# Patient Record
Sex: Female | Born: 1960 | Race: Black or African American | Hispanic: No | Smoking: Current every day smoker
Health system: Southern US, Community
[De-identification: ages and names within clinical notes are randomized; demographics above are authoritative.]

## PROBLEM LIST (undated history)

## (undated) DIAGNOSIS — J45909 Unspecified asthma, uncomplicated: Secondary | ICD-10-CM

## (undated) DIAGNOSIS — C801 Malignant (primary) neoplasm, unspecified: Secondary | ICD-10-CM

## (undated) DIAGNOSIS — E119 Type 2 diabetes mellitus without complications: Secondary | ICD-10-CM

## (undated) DIAGNOSIS — F1210 Cannabis abuse, uncomplicated: Secondary | ICD-10-CM

## (undated) DIAGNOSIS — G8929 Other chronic pain: Secondary | ICD-10-CM

## (undated) DIAGNOSIS — I10 Essential (primary) hypertension: Secondary | ICD-10-CM

## (undated) HISTORY — PX: OTHER SURGICAL HISTORY: SHX169

## (undated) HISTORY — PX: TUBAL LIGATION: SHX77

## (undated) HISTORY — PX: JOINT REPLACEMENT: SHX530

---

## 1999-04-23 ENCOUNTER — Emergency Department (HOSPITAL_COMMUNITY): Admission: EM | Admit: 1999-04-23 | Discharge: 1999-04-23 | Payer: Self-pay | Admitting: Emergency Medicine

## 1999-04-26 ENCOUNTER — Emergency Department (HOSPITAL_COMMUNITY): Admission: EM | Admit: 1999-04-26 | Discharge: 1999-04-26 | Payer: Self-pay | Admitting: Emergency Medicine

## 1999-04-26 ENCOUNTER — Encounter: Payer: Self-pay | Admitting: Emergency Medicine

## 1999-05-04 ENCOUNTER — Ambulatory Visit (HOSPITAL_COMMUNITY): Admission: RE | Admit: 1999-05-04 | Discharge: 1999-05-04 | Payer: Self-pay | Admitting: Internal Medicine

## 1999-05-04 ENCOUNTER — Encounter: Admission: RE | Admit: 1999-05-04 | Discharge: 1999-05-04 | Payer: Self-pay | Admitting: Internal Medicine

## 1999-05-11 ENCOUNTER — Encounter: Admission: RE | Admit: 1999-05-11 | Discharge: 1999-05-11 | Payer: Self-pay | Admitting: Internal Medicine

## 1999-09-07 ENCOUNTER — Encounter: Payer: Self-pay | Admitting: Emergency Medicine

## 1999-09-07 ENCOUNTER — Emergency Department (HOSPITAL_COMMUNITY): Admission: EM | Admit: 1999-09-07 | Discharge: 1999-09-07 | Payer: Self-pay | Admitting: Emergency Medicine

## 1999-09-14 ENCOUNTER — Encounter: Admission: RE | Admit: 1999-09-14 | Discharge: 1999-09-14 | Payer: Self-pay | Admitting: Obstetrics & Gynecology

## 1999-10-05 ENCOUNTER — Encounter: Admission: RE | Admit: 1999-10-05 | Discharge: 1999-10-05 | Payer: Self-pay | Admitting: Obstetrics & Gynecology

## 1999-11-30 ENCOUNTER — Encounter: Admission: RE | Admit: 1999-11-30 | Discharge: 1999-11-30 | Payer: Self-pay | Admitting: Obstetrics

## 2000-03-21 ENCOUNTER — Ambulatory Visit (HOSPITAL_COMMUNITY): Admission: RE | Admit: 2000-03-21 | Discharge: 2000-03-21 | Payer: Self-pay | Admitting: Obstetrics & Gynecology

## 2000-03-21 ENCOUNTER — Encounter: Admission: RE | Admit: 2000-03-21 | Discharge: 2000-03-21 | Payer: Self-pay | Admitting: Obstetrics & Gynecology

## 2000-03-21 ENCOUNTER — Encounter: Payer: Self-pay | Admitting: Obstetrics & Gynecology

## 2000-03-22 ENCOUNTER — Inpatient Hospital Stay (HOSPITAL_COMMUNITY): Admission: AD | Admit: 2000-03-22 | Discharge: 2000-03-22 | Payer: Self-pay | Admitting: Obstetrics

## 2000-03-26 ENCOUNTER — Inpatient Hospital Stay (HOSPITAL_COMMUNITY): Admission: AD | Admit: 2000-03-26 | Discharge: 2000-03-28 | Payer: Self-pay | Admitting: *Deleted

## 2000-04-04 ENCOUNTER — Inpatient Hospital Stay (HOSPITAL_COMMUNITY): Admission: AD | Admit: 2000-04-04 | Discharge: 2000-04-06 | Payer: Self-pay | Admitting: *Deleted

## 2000-04-04 ENCOUNTER — Encounter (HOSPITAL_COMMUNITY): Admission: RE | Admit: 2000-04-04 | Discharge: 2000-05-19 | Payer: Self-pay | Admitting: *Deleted

## 2000-04-11 ENCOUNTER — Encounter: Payer: Self-pay | Admitting: *Deleted

## 2000-04-20 ENCOUNTER — Inpatient Hospital Stay (HOSPITAL_COMMUNITY): Admission: AD | Admit: 2000-04-20 | Discharge: 2000-04-20 | Payer: Self-pay | Admitting: Obstetrics & Gynecology

## 2000-04-28 ENCOUNTER — Encounter: Payer: Self-pay | Admitting: *Deleted

## 2000-05-02 ENCOUNTER — Encounter: Payer: Self-pay | Admitting: *Deleted

## 2000-05-18 ENCOUNTER — Encounter (INDEPENDENT_AMBULATORY_CARE_PROVIDER_SITE_OTHER): Payer: Self-pay

## 2000-05-18 ENCOUNTER — Inpatient Hospital Stay (HOSPITAL_COMMUNITY): Admission: AD | Admit: 2000-05-18 | Discharge: 2000-06-04 | Payer: Self-pay | Admitting: Obstetrics & Gynecology

## 2000-05-20 ENCOUNTER — Encounter: Payer: Self-pay | Admitting: *Deleted

## 2000-05-24 ENCOUNTER — Encounter: Payer: Self-pay | Admitting: *Deleted

## 2000-05-26 ENCOUNTER — Encounter: Payer: Self-pay | Admitting: Obstetrics & Gynecology

## 2000-05-28 ENCOUNTER — Encounter: Payer: Self-pay | Admitting: Obstetrics & Gynecology

## 2000-05-29 ENCOUNTER — Encounter: Payer: Self-pay | Admitting: Obstetrics & Gynecology

## 2000-06-07 ENCOUNTER — Inpatient Hospital Stay (HOSPITAL_COMMUNITY): Admission: AD | Admit: 2000-06-07 | Discharge: 2000-06-07 | Payer: Self-pay | Admitting: Obstetrics

## 2000-08-01 ENCOUNTER — Encounter (INDEPENDENT_AMBULATORY_CARE_PROVIDER_SITE_OTHER): Payer: Self-pay | Admitting: Specialist

## 2000-08-01 ENCOUNTER — Inpatient Hospital Stay (HOSPITAL_COMMUNITY): Admission: AD | Admit: 2000-08-01 | Discharge: 2000-08-01 | Payer: Self-pay | Admitting: *Deleted

## 2000-08-05 ENCOUNTER — Emergency Department (HOSPITAL_COMMUNITY): Admission: EM | Admit: 2000-08-05 | Discharge: 2000-08-05 | Payer: Self-pay | Admitting: *Deleted

## 2000-08-31 ENCOUNTER — Encounter: Admission: RE | Admit: 2000-08-31 | Discharge: 2000-08-31 | Payer: Self-pay | Admitting: Obstetrics

## 2000-11-30 ENCOUNTER — Encounter: Admission: RE | Admit: 2000-11-30 | Discharge: 2000-11-30 | Payer: Self-pay | Admitting: Obstetrics

## 2001-01-24 ENCOUNTER — Emergency Department (HOSPITAL_COMMUNITY): Admission: EM | Admit: 2001-01-24 | Discharge: 2001-01-24 | Payer: Self-pay | Admitting: Emergency Medicine

## 2001-03-02 ENCOUNTER — Encounter: Admission: RE | Admit: 2001-03-02 | Discharge: 2001-03-02 | Payer: Self-pay | Admitting: Obstetrics & Gynecology

## 2001-04-09 ENCOUNTER — Encounter: Admission: RE | Admit: 2001-04-09 | Discharge: 2001-04-09 | Payer: Self-pay | Admitting: Obstetrics & Gynecology

## 2001-05-31 ENCOUNTER — Encounter: Admission: RE | Admit: 2001-05-31 | Discharge: 2001-05-31 | Payer: Self-pay | Admitting: Obstetrics

## 2001-07-18 ENCOUNTER — Emergency Department (HOSPITAL_COMMUNITY): Admission: EM | Admit: 2001-07-18 | Discharge: 2001-07-18 | Payer: Self-pay | Admitting: Emergency Medicine

## 2001-07-18 ENCOUNTER — Encounter: Payer: Self-pay | Admitting: Emergency Medicine

## 2001-08-16 ENCOUNTER — Encounter: Admission: RE | Admit: 2001-08-16 | Discharge: 2001-08-16 | Payer: Self-pay | Admitting: *Deleted

## 2001-09-18 ENCOUNTER — Encounter: Admission: RE | Admit: 2001-09-18 | Discharge: 2001-09-18 | Payer: Self-pay | Admitting: Obstetrics and Gynecology

## 2001-10-08 ENCOUNTER — Ambulatory Visit (HOSPITAL_COMMUNITY): Admission: RE | Admit: 2001-10-08 | Discharge: 2001-10-08 | Payer: Self-pay | Admitting: Obstetrics and Gynecology

## 2001-10-15 ENCOUNTER — Inpatient Hospital Stay (HOSPITAL_COMMUNITY): Admission: AD | Admit: 2001-10-15 | Discharge: 2001-10-15 | Payer: Self-pay | Admitting: *Deleted

## 2001-10-30 ENCOUNTER — Encounter: Admission: RE | Admit: 2001-10-30 | Discharge: 2001-10-30 | Payer: Self-pay | Admitting: *Deleted

## 2002-07-26 ENCOUNTER — Inpatient Hospital Stay (HOSPITAL_COMMUNITY): Admission: AD | Admit: 2002-07-26 | Discharge: 2002-07-26 | Payer: Self-pay | Admitting: Family Medicine

## 2002-07-26 ENCOUNTER — Encounter: Payer: Self-pay | Admitting: Family Medicine

## 2002-07-30 ENCOUNTER — Inpatient Hospital Stay (HOSPITAL_COMMUNITY): Admission: AD | Admit: 2002-07-30 | Discharge: 2002-07-30 | Payer: Self-pay | Admitting: *Deleted

## 2002-08-06 ENCOUNTER — Other Ambulatory Visit: Admission: RE | Admit: 2002-08-06 | Discharge: 2002-08-06 | Payer: Self-pay | Admitting: Obstetrics and Gynecology

## 2002-08-06 ENCOUNTER — Encounter: Admission: RE | Admit: 2002-08-06 | Discharge: 2002-08-06 | Payer: Self-pay | Admitting: Obstetrics and Gynecology

## 2002-09-13 ENCOUNTER — Encounter: Admission: RE | Admit: 2002-09-13 | Discharge: 2002-09-13 | Payer: Self-pay | Admitting: Family Medicine

## 2002-09-25 ENCOUNTER — Encounter: Admission: RE | Admit: 2002-09-25 | Discharge: 2002-09-25 | Payer: Self-pay | Admitting: Internal Medicine

## 2002-09-25 ENCOUNTER — Ambulatory Visit (HOSPITAL_COMMUNITY): Admission: RE | Admit: 2002-09-25 | Discharge: 2002-09-25 | Payer: Self-pay | Admitting: Obstetrics and Gynecology

## 2002-10-02 ENCOUNTER — Encounter: Admission: RE | Admit: 2002-10-02 | Discharge: 2002-10-02 | Payer: Self-pay | Admitting: Internal Medicine

## 2002-10-07 ENCOUNTER — Encounter: Payer: Self-pay | Admitting: Internal Medicine

## 2002-10-07 ENCOUNTER — Encounter: Admission: RE | Admit: 2002-10-07 | Discharge: 2002-10-07 | Payer: Self-pay | Admitting: Internal Medicine

## 2002-10-07 ENCOUNTER — Ambulatory Visit (HOSPITAL_COMMUNITY): Admission: RE | Admit: 2002-10-07 | Discharge: 2002-10-07 | Payer: Self-pay | Admitting: Internal Medicine

## 2003-07-23 ENCOUNTER — Emergency Department (HOSPITAL_COMMUNITY): Admission: EM | Admit: 2003-07-23 | Discharge: 2003-07-23 | Payer: Self-pay | Admitting: Emergency Medicine

## 2003-07-24 ENCOUNTER — Inpatient Hospital Stay (HOSPITAL_COMMUNITY): Admission: AD | Admit: 2003-07-24 | Discharge: 2003-07-24 | Payer: Self-pay | Admitting: Obstetrics & Gynecology

## 2004-03-07 ENCOUNTER — Emergency Department (HOSPITAL_COMMUNITY): Admission: EM | Admit: 2004-03-07 | Discharge: 2004-03-07 | Payer: Self-pay | Admitting: Emergency Medicine

## 2004-07-29 ENCOUNTER — Ambulatory Visit: Payer: Self-pay | Admitting: Family Medicine

## 2004-09-23 ENCOUNTER — Emergency Department (HOSPITAL_COMMUNITY): Admission: EM | Admit: 2004-09-23 | Discharge: 2004-09-23 | Payer: Self-pay | Admitting: Emergency Medicine

## 2004-12-28 ENCOUNTER — Encounter: Admission: RE | Admit: 2004-12-28 | Discharge: 2004-12-28 | Payer: Self-pay | Admitting: Family Medicine

## 2005-02-04 ENCOUNTER — Ambulatory Visit (HOSPITAL_COMMUNITY): Admission: RE | Admit: 2005-02-04 | Discharge: 2005-02-04 | Payer: Self-pay | Admitting: Family Medicine

## 2005-02-22 ENCOUNTER — Emergency Department (HOSPITAL_COMMUNITY): Admission: EM | Admit: 2005-02-22 | Discharge: 2005-02-22 | Payer: Self-pay | Admitting: Emergency Medicine

## 2005-04-07 ENCOUNTER — Ambulatory Visit: Payer: Self-pay | Admitting: Family Medicine

## 2005-05-02 ENCOUNTER — Ambulatory Visit: Payer: Self-pay | Admitting: Family Medicine

## 2005-12-22 ENCOUNTER — Ambulatory Visit: Payer: Self-pay | Admitting: Family Medicine

## 2006-03-16 ENCOUNTER — Ambulatory Visit: Payer: Self-pay | Admitting: Family Medicine

## 2006-06-19 ENCOUNTER — Ambulatory Visit: Payer: Self-pay | Admitting: Family Medicine

## 2006-07-03 ENCOUNTER — Ambulatory Visit: Payer: Self-pay | Admitting: Family Medicine

## 2006-08-15 ENCOUNTER — Ambulatory Visit: Payer: Self-pay | Admitting: Family Medicine

## 2006-08-16 ENCOUNTER — Ambulatory Visit: Payer: Self-pay | Admitting: Family Medicine

## 2006-09-26 ENCOUNTER — Ambulatory Visit: Payer: Self-pay | Admitting: *Deleted

## 2006-10-12 ENCOUNTER — Ambulatory Visit: Payer: Self-pay | Admitting: Internal Medicine

## 2006-12-21 ENCOUNTER — Ambulatory Visit: Payer: Self-pay | Admitting: Internal Medicine

## 2007-01-05 DIAGNOSIS — I10 Essential (primary) hypertension: Secondary | ICD-10-CM | POA: Insufficient documentation

## 2007-01-05 DIAGNOSIS — R51 Headache: Secondary | ICD-10-CM

## 2007-01-05 DIAGNOSIS — R519 Headache, unspecified: Secondary | ICD-10-CM | POA: Insufficient documentation

## 2007-01-05 DIAGNOSIS — Z8541 Personal history of malignant neoplasm of cervix uteri: Secondary | ICD-10-CM

## 2007-01-18 ENCOUNTER — Ambulatory Visit: Payer: Self-pay | Admitting: Internal Medicine

## 2007-01-18 LAB — CONVERTED CEMR LAB
AST: 12 units/L (ref 0–37)
BUN: 6 mg/dL (ref 6–23)
Basophils Absolute: 0 10*3/uL (ref 0.0–0.1)
Basophils Relative: 0 % (ref 0–1)
CO2: 25 meq/L (ref 19–32)
Calcium: 9.3 mg/dL (ref 8.4–10.5)
Cholesterol: 127 mg/dL (ref 0–200)
Creatinine, Ser: 0.68 mg/dL (ref 0.40–1.20)
Eosinophils Absolute: 0.3 10*3/uL (ref 0.0–0.7)
HCT: 43.2 % (ref 36.0–46.0)
HDL: 34 mg/dL — ABNORMAL LOW (ref >39)
LDL Cholesterol: 66 mg/dL (ref 0–99)
Lymphs Abs: 1.6 10*3/uL (ref 0.7–3.3)
MCHC: 32.9 g/dL (ref 30.0–36.0)
Monocytes Relative: 13 % — ABNORMAL HIGH (ref 3–11)
Neutro Abs: 3.1 10*3/uL (ref 1.7–7.7)
Neutrophils Relative %: 55 % (ref 43–77)
Platelets: 200 10*3/uL (ref 150–400)
Sodium: 145 meq/L (ref 135–145)
Total Bilirubin: 0.4 mg/dL (ref 0.3–1.2)
Total CHOL/HDL Ratio: 3.7
Triglycerides: 136 mg/dL (ref <150)

## 2007-02-07 ENCOUNTER — Encounter (INDEPENDENT_AMBULATORY_CARE_PROVIDER_SITE_OTHER): Payer: Self-pay | Admitting: *Deleted

## 2007-03-02 ENCOUNTER — Ambulatory Visit: Payer: Self-pay | Admitting: Internal Medicine

## 2007-03-07 ENCOUNTER — Ambulatory Visit: Payer: Self-pay | Admitting: Internal Medicine

## 2007-03-09 ENCOUNTER — Ambulatory Visit: Payer: Self-pay | Admitting: Internal Medicine

## 2007-03-14 ENCOUNTER — Ambulatory Visit: Payer: Self-pay | Admitting: Family Medicine

## 2007-08-08 ENCOUNTER — Emergency Department (HOSPITAL_COMMUNITY): Admission: EM | Admit: 2007-08-08 | Discharge: 2007-08-08 | Payer: Self-pay | Admitting: Emergency Medicine

## 2007-10-18 ENCOUNTER — Ambulatory Visit: Payer: Self-pay | Admitting: Internal Medicine

## 2008-05-28 ENCOUNTER — Encounter (INDEPENDENT_AMBULATORY_CARE_PROVIDER_SITE_OTHER): Payer: Self-pay | Admitting: Family Medicine

## 2008-05-28 ENCOUNTER — Ambulatory Visit: Payer: Self-pay | Admitting: Family Medicine

## 2008-10-04 ENCOUNTER — Emergency Department (HOSPITAL_COMMUNITY): Admission: EM | Admit: 2008-10-04 | Discharge: 2008-10-04 | Payer: Self-pay | Admitting: Emergency Medicine

## 2008-11-13 ENCOUNTER — Emergency Department (HOSPITAL_COMMUNITY): Admission: EM | Admit: 2008-11-13 | Discharge: 2008-11-13 | Payer: Self-pay | Admitting: Emergency Medicine

## 2009-03-30 ENCOUNTER — Ambulatory Visit: Payer: Self-pay | Admitting: Internal Medicine

## 2009-04-27 ENCOUNTER — Ambulatory Visit: Payer: Self-pay | Admitting: Internal Medicine

## 2009-04-27 LAB — CONVERTED CEMR LAB
ALT: 11 units/L (ref 0–35)
Albumin: 4.5 g/dL (ref 3.5–5.2)
BUN: 10 mg/dL (ref 6–23)
Calcium: 10 mg/dL (ref 8.4–10.5)
Cholesterol: 148 mg/dL (ref 0–200)
HDL: 46 mg/dL (ref >39)
LDL Cholesterol: 77 mg/dL (ref 0–99)
Microalb, Ur: 15.93 mg/dL — ABNORMAL HIGH (ref 0.00–1.89)
Potassium: 4.1 meq/L (ref 3.5–5.3)
Total CHOL/HDL Ratio: 3.2
Total Protein: 7.6 g/dL (ref 6.0–8.3)
Triglycerides: 124 mg/dL (ref <150)
VLDL: 25 mg/dL (ref 0–40)

## 2009-06-29 ENCOUNTER — Other Ambulatory Visit: Admission: RE | Admit: 2009-06-29 | Discharge: 2009-06-29 | Payer: Self-pay | Admitting: Internal Medicine

## 2009-06-29 ENCOUNTER — Ambulatory Visit: Payer: Self-pay | Admitting: Internal Medicine

## 2009-07-14 ENCOUNTER — Ambulatory Visit (HOSPITAL_COMMUNITY): Admission: RE | Admit: 2009-07-14 | Discharge: 2009-07-14 | Payer: Self-pay | Admitting: Family Medicine

## 2009-08-31 ENCOUNTER — Ambulatory Visit: Payer: Self-pay | Admitting: Internal Medicine

## 2009-09-10 ENCOUNTER — Emergency Department (HOSPITAL_COMMUNITY): Admission: EM | Admit: 2009-09-10 | Discharge: 2009-09-10 | Payer: Self-pay | Admitting: Emergency Medicine

## 2009-11-28 ENCOUNTER — Emergency Department (HOSPITAL_COMMUNITY): Admission: EM | Admit: 2009-11-28 | Discharge: 2009-11-28 | Payer: Self-pay | Admitting: Emergency Medicine

## 2010-04-18 ENCOUNTER — Emergency Department (HOSPITAL_COMMUNITY)
Admission: EM | Admit: 2010-04-18 | Discharge: 2010-04-18 | Payer: Self-pay | Source: Home / Self Care | Admitting: Emergency Medicine

## 2010-08-10 ENCOUNTER — Encounter: Payer: Self-pay | Admitting: Internal Medicine

## 2010-08-10 LAB — CONVERTED CEMR LAB
Barbiturate Quant, Ur: NEGATIVE
Creatinine,U: 65.4 mg/dL
Methadone: NEGATIVE
Propoxyphene: NEGATIVE

## 2010-10-08 NOTE — Op Note (Signed)
[facility]  Patient:    Lisa Ibarra, Lisa Ibarra Visit Number: [id number] MRN: [id number]          Service Type: DSU Location: [facility] Attending Physician:  Amada Kingfisher. Dictated by:   Clement Husbands, M.D. Proc. Date: 10/08/01 Admit Date:  10/08/2001 Discharge Date: 10/08/2001                             Operative Report  PREOPERATIVE DIAGNOSES:       Requested sterilization.  POSTOPERATIVE DIAGNOSES:      Requested sterilization.  OPERATION:                    Laparoscopic tubal sterilization.  SURGEON:                      Clement Husbands, M.D.  ASSISTANT:                    Argentina Donovan, M.D.  ANESTHESIA:                   General.  PROCEDURE:                    The patient was in satisfactory general anesthesia, underwent dorsal lithotomy position.  The vagina was prepped.  The Hulka intrauterine cervical tenaculum was positioned.  The bladder was catheterized and then the patient was draped.  A small transverse infraumbilical skin incision was made and the long Veress needle introduced into the abdominal cavity.  This patient was obese and had a very thick abdominal wall.  CO2 was insufflated at a rate of 1 L/minute until approximately 3.5 L yielded adequate abdominal distention.  Veress needle was removed.  The atraumatic laparoscopic trocar was introduced followed by the operating laparoscope.  Position in the abdomen was satisfactory. Visualization revealed the uterus and anterior and posterior cul-de-sacs to be completely normal.  The left fallopian tube was normal.  The left ovary was normal, but had a few filmy adhesions over its surface.  The right ovary was normal.  The right fallopian tube was likewise normal.  Mid portions of both fallopian tubes were then grasped with the operating coagulating forceps and coagulated three separate times on each fallopian tube in its mid portion.  Each time was about 0.5-1 cm on either side  of the first coagulation site.  Using the cutting scissors then the mid portion of both fallopian tubes in the middle of the coagulated site was transected.  It should be noted during each coagulation step the needle gauge returned to 0.  The operating laparoscope was slowly removed as was the trocar and a lot of the excess CO2 gas was allowed to escape.  One 3-0 plain Vicryl suture was placed deep in the incision and then the skin edges approximated with a subcuticular running 3-0 Vicryl suture with a knot on the left side.  Dry sterile dressing was applied.  Sponge and needle count was correct.  Estimated blood loss negligible.  Patient tolerated the procedure well and was returned to the recovery room in satisfactory condition. Dictated by:   Clement Husbands, M.D. Attending Physician:  Amada Kingfisher. DD:  10/08/01 TD:  10/10/01 Job: [id number]

## 2010-10-08 NOTE — Discharge Summary (Signed)
[facility] of [facility]  Patient:    Lisa Ibarra, Lisa Ibarra                          MRN: [id number] Adm. Date:  05/18/00 Disc. Date: 06/04/00 Attending:  Roseanna Rainbow, M.D. Dictator:   Jamey Reas, M.D.                           Discharge Summary  DATE OF BIRTH:                [birth year].  ADMISSION DIAGNOSES:          1. Hypertension.                               2. Polyhydramnios.                               3. Failed induction.                               4. Group B streptococcus positive.                               5. Term intrauterine pregnancy.                               6. Preeclampsia.  DISCHARGE DIAGNOSES:          1. Hypertension.                               2. Polyhydramnios.                               3. Failed induction.                               4. Group B streptococcus positive.                               5. Term intrauterine pregnancy.                               6. Preeclampsia.                               7. Status post low transverse cesarean section.  PROCEDURES:                   The patient had a low transverse cesarean section on May 31, 2000 performed by Dr. Coral Ceo.  HISTORY OF PRESENT ILLNESS:   A 50 year old, G3, P0-0-2-0 at 39 weeks 4 days presents for induction of labor from [facility] secondary to [facility] with blood pressure 153/103 at clinic.  HOSPITAL COURSE:              The patient was admitted, had Cytotec induction without significant change. The  patient was then watched over several days in the hospital for further evaluation and close monitoring. She had a 24-hour urine performed on May 28, 2000. The patient was attempted to induce her labor on May 30, 2000. She had little success. The patient progressed to a maximum of latent phase, very low pain threshold. She was started on Pitocin. She made limited change in her cervix. She was taken to the operating room  for failed induction with repetitive late decelerations, meconium-stained fluid, status post amnioinfusion, and macrosomia. She delivered a viable female with Apgars 9 at one minute and 9 at five minutes. Cord pH was 7.19.The patient had routine postoperative care. Her blood pressure remained stable throughout hospitalization. She was discharged home on June 04, 2000. She was breast and bottle feeding. The patient received Depo-Provera for birth control at the time of discharge.  DISCHARGE CONDITION:          Good.  DISPOSITION:                  Discharge to home.  DISCHARGE MEDICATIONS:        Motrin, Percocet, Depo-Provera, and prenatal vitamins.  DISCHARGE FOLLOWUP:           The patient was to return to [facility]U in two to three days for staple removal. She was to return to [facility] in six weeks for further evaluation.  DISCHARGE INSTRUCTIONS:       Routine postoperative instructions. DD:  11/08/00 TD:  11/08/00 Job: [id number]

## 2010-10-08 NOTE — Discharge Summary (Signed)
[facility] of [facility]  Patient:    Lisa Ibarra, Lisa Ibarra                          MRN: [id number] Adm. Date:  [id number] Disc. Date: 04/06/00 Attending:  Michaelle Copas                           Discharge Summary  DATE OF BIRTH:                09/11/1960  DIAGNOSES:                    Preterm contractions, intrauterine pregnancy at 64 2/7 weeks.  SECONDARY DIAGNOSES:          Intrauterine pregnancy at 33 2/7 weeks, preterm labor, group B strep positive.  HISTORY OF PRESENT ILLNESS:   This 50 year old presented to routine prenatal visit and presented with uterine contractions.  She has a history of preterm uterine contractions and was admitted approximately two weeks ago.  At that time patient was treated with Unasyn and Augmentin for her group B strep positive assessment.  HOSPITAL COURSE:              Patient was admitted, monitored continuously with toco and external fetal monitor.  Patient remained on her Procardia 60 mg XL q.d.  PIH laboratories and 24 hour urine were obtained.  PIH laboratories were insignificant.  A 24 hour urine was significant for urinary protein elevated at 555.  CONSULTANTS:                  None.  PROCEDURE:                    None.  DISCHARGE CONDITION:          Patient was discharged home in stable condition with follow-up with primary care Deidre Bledsoe, [facility] in five days.  Patient will have a nonstress test and ultrasound for amniotic fluid at that time.  DISCHARGE DIAGNOSES:          Preterm labor, GBS positive, preeclampsia.  DISCHARGE MEDICATIONS:        Procardia 60 mg XL p.o. q.d.  ACTIVITY:                     Bed rest until evaluated in five days.  DIET:                         Low fat, low sugar diet. DD:  04/06/00 TD:  04/06/00 Job: [id number]

## 2010-10-08 NOTE — Op Note (Signed)
[facility] of [facility]  Patient:    Lisa Ibarra, Lisa Ibarra                          MRN: [id number] Proc. Date: 05/31/00 Adm. Date:  [id number] Attending:  Antionette Char Dictator:   Jamey Reas, M.D.                           Operative Report  DATE OF BIRTH:  04-11-1961  TIME:  13:10:00  PREOPERATIVE DIAGNOSES:  1. Failed induction of labor.                          2. Repetitive late decelerations.                          3. Meconium status post amnioinfusion.                          4. Macrosomia.  POSTOPERATIVE DIAGNOSES: 1. Failed induction of labor.                          2. Repetitive late decelerations.                          3. Meconium status post amnioinfusion.                          4. Macrosomia.  PROCEDURE:               Primary transverse cesarean section via Pfannenstiel.  SURGEON:                 Charles A. Clearance Coots, M.D.  ASSISTANT:               Jamey Reas, M.D.  ANESTHESIA:              Spinal.  FLUIDS:                  Lactated ringers 2000 ccs.  ESTIMATED BLOOD LOSS:    600 ccs.  URINE OUTPUT:            200 ccs clear.  FINDINGS:                Viable female, Apgars at 9 at 1 minute, 9 at 5 minutes, cord pH 7.19, weight 8 pounds 2 ounces.  Infant in cephalic presentation.  COMPLICATIONS:           None  CONDITION:               Stable  PROCEDURE:               The patient was taken to the operating room where her epidural anesthesia was found to be adequate.  She was then prepared and draped in a normal sterile fashion in the dorsal supine position with a leftward tilt.  Pfannenstiel skin incision was then made with the scalpel and carried through to the underlying layer of fascia with a Bovie.  The fascia was then excised in the midline and the incision extended laterally with Mayo scissors.  The superior aspect of the fascial incision was then grasped with Kocher clamps, elevated, and underlying  rectus muscles dissected  off bluntly. Attention was then turned to the inferior aspect of the incision which, in a similar fashion, was grasped, tented up with Kocher clamps and the rectus muscles dissected off bluntly.  The rectus muscles were then separated in the midline and the peritoneum identified, tented up and entered sharply with Metzenbaum scissors.  The peritoneal incision was then extended superiorly and inferiorly with good visualization of the bladder.  The bladder blade was then inserted and the vesicouterine peritoneum identified and grasped, pick ups and entered sharply with Metzenbaum scissors.  This incision was then extended laterally and the bladder flap created digitally.  The bladder blade was then reinserted in the lower uterine segment, incised in a transverse fashion. With the scalpel, the uterine incision was then extended laterally with bandage scissors.  The bladder blade was removed and the infants head atraumatically.  The nose and mouth were suctioned with bulb suction and the cord clamped and cut.  The infant was handed off to awaiting pediatricians. Cord gases were sent.  The placenta was then removed manually, the uterus was exteriorized and cleared of all clots and debris.  The uterine incision was repaired with 0 Monocryl suture in a running lock fashion until hemostatic.  The gutters were cleared of all clots and the uterus was returned to the abdomen.  The gutters were then irrigated thoroughly.  The muscles were reapproximated using scraps of the 0 Monocryl suture.  The fascia was reapproximated with 0 PDS in a running fashion.  2-0 plain suture was used to close the subcutaneous layer.  The skin was closed with staples.  Sponge, lap, and needle counts were correct times two.  Unisom was given to the patient prior to the procedure.  The patient was taken to the recovery room in stable condition. DD:  05/31/00 TD:  06/01/00 Job: [id number]

## 2010-12-16 ENCOUNTER — Emergency Department (HOSPITAL_COMMUNITY): Payer: Medicaid Other

## 2010-12-16 ENCOUNTER — Emergency Department (HOSPITAL_COMMUNITY)
Admission: EM | Admit: 2010-12-16 | Discharge: 2010-12-16 | Disposition: A | Discharge status: Home / Self Care | Payer: Medicaid Other | Attending: Emergency Medicine | Admitting: Emergency Medicine

## 2010-12-16 DIAGNOSIS — R071 Chest pain on breathing: Secondary | ICD-10-CM | POA: Insufficient documentation

## 2010-12-16 DIAGNOSIS — M79609 Pain in unspecified limb: Secondary | ICD-10-CM | POA: Insufficient documentation

## 2010-12-16 DIAGNOSIS — E7800 Pure hypercholesterolemia, unspecified: Secondary | ICD-10-CM | POA: Insufficient documentation

## 2010-12-16 DIAGNOSIS — I10 Essential (primary) hypertension: Secondary | ICD-10-CM | POA: Insufficient documentation

## 2010-12-16 DIAGNOSIS — E119 Type 2 diabetes mellitus without complications: Secondary | ICD-10-CM | POA: Insufficient documentation

## 2010-12-16 DIAGNOSIS — M5412 Radiculopathy, cervical region: Secondary | ICD-10-CM | POA: Insufficient documentation

## 2010-12-16 DIAGNOSIS — M542 Cervicalgia: Secondary | ICD-10-CM | POA: Insufficient documentation

## 2010-12-16 DIAGNOSIS — M25519 Pain in unspecified shoulder: Secondary | ICD-10-CM | POA: Insufficient documentation

## 2010-12-16 LAB — BASIC METABOLIC PANEL
Chloride: 104 mEq/L (ref 96–112)
Creatinine, Ser: 0.61 mg/dL (ref 0.50–1.10)
GFR calc non Af Amer: >60 mL/min (ref >60)
Glucose, Bld: 109 mg/dL — ABNORMAL HIGH (ref 70–99)
Sodium: 137 mEq/L (ref 135–145)

## 2010-12-16 LAB — CBC
Hemoglobin: 14 g/dL (ref 12.0–15.0)
MCH: 30.2 pg (ref 26.0–34.0)
MCHC: 33.7 g/dL (ref 30.0–36.0)
MCV: 89.8 fL (ref 78.0–100.0)
Platelets: 201 10*3/uL (ref 150–400)
RBC: 4.63 MIL/uL (ref 3.87–5.11)
WBC: 5.7 10*3/uL (ref 4.0–10.5)

## 2010-12-16 LAB — CK TOTAL AND CKMB (NOT AT ARMC)
Relative Index: 1.2 (ref 0.0–2.5)
Total CK: 219 U/L — ABNORMAL HIGH (ref 7–177)

## 2010-12-16 LAB — URINALYSIS, ROUTINE W REFLEX MICROSCOPIC
Bilirubin Urine: NEGATIVE
Glucose, UA: NEGATIVE mg/dL
Ketones, ur: NEGATIVE mg/dL
pH: 5.5 (ref 5.0–8.0)

## 2010-12-16 LAB — URINE MICROSCOPIC-ADD ON

## 2010-12-16 LAB — DIFFERENTIAL
Basophils Absolute: 0 10*3/uL (ref 0.0–0.1)
Lymphocytes Relative: 40 % (ref 12–46)
Monocytes Absolute: 0.6 10*3/uL (ref 0.1–1.0)
Monocytes Relative: 10 % (ref 3–12)

## 2011-06-15 ENCOUNTER — Other Ambulatory Visit (HOSPITAL_COMMUNITY): Payer: Self-pay | Admitting: Family Medicine

## 2011-06-15 DIAGNOSIS — M48061 Spinal stenosis, lumbar region without neurogenic claudication: Secondary | ICD-10-CM

## 2011-06-16 ENCOUNTER — Telehealth (HOSPITAL_COMMUNITY): Payer: Self-pay | Admitting: *Deleted

## 2011-06-17 ENCOUNTER — Other Ambulatory Visit (HOSPITAL_COMMUNITY): Payer: Medicaid Other

## 2011-07-04 ENCOUNTER — Telehealth (HOSPITAL_COMMUNITY): Payer: Self-pay

## 2011-07-08 ENCOUNTER — Encounter (HOSPITAL_COMMUNITY): Payer: Self-pay

## 2011-07-08 ENCOUNTER — Ambulatory Visit (HOSPITAL_COMMUNITY)
Admission: RE | Admit: 2011-07-08 | Discharge: 2011-07-08 | Disposition: A | Discharge status: Home / Self Care | Payer: Medicaid Other | Source: Ambulatory Visit | Attending: Family Medicine | Admitting: Family Medicine

## 2011-07-08 DIAGNOSIS — I10 Essential (primary) hypertension: Secondary | ICD-10-CM | POA: Insufficient documentation

## 2011-07-08 DIAGNOSIS — M5137 Other intervertebral disc degeneration, lumbosacral region: Secondary | ICD-10-CM | POA: Insufficient documentation

## 2011-07-08 DIAGNOSIS — E119 Type 2 diabetes mellitus without complications: Secondary | ICD-10-CM | POA: Insufficient documentation

## 2011-07-08 DIAGNOSIS — M51379 Other intervertebral disc degeneration, lumbosacral region without mention of lumbar back pain or lower extremity pain: Secondary | ICD-10-CM | POA: Insufficient documentation

## 2011-07-08 DIAGNOSIS — M48061 Spinal stenosis, lumbar region without neurogenic claudication: Secondary | ICD-10-CM | POA: Insufficient documentation

## 2011-07-08 HISTORY — DX: Cannabis abuse, uncomplicated: F12.10

## 2011-07-08 HISTORY — DX: Type 2 diabetes mellitus without complications: E11.9

## 2011-07-08 HISTORY — DX: Essential (primary) hypertension: I10

## 2011-07-08 LAB — GLUCOSE, CAPILLARY: Glucose-Capillary: 117 mg/dL — ABNORMAL HIGH (ref 70–99)

## 2011-07-08 MED ORDER — MIDAZOLAM HCL 5 MG/5ML IJ SOLN
INTRAMUSCULAR | Status: AC | PRN
  Administered 2011-07-08 (×4): 2 mg via INTRAVENOUS

## 2011-07-08 MED ORDER — MIDAZOLAM HCL 2 MG/2ML IJ SOLN
INTRAMUSCULAR | Status: AC
  Filled 2011-07-08: qty 10

## 2011-07-08 MED ORDER — MIDAZOLAM HCL 2 MG/2ML IJ SOLN
1.0000 mg | INTRAMUSCULAR | Status: DC | PRN
  Filled 2011-07-08: qty 10

## 2011-07-08 MED ORDER — FENTANYL CITRATE 0.05 MG/ML IJ SOLN
INTRAMUSCULAR | Status: AC | PRN
  Administered 2011-07-08 (×4): 50 ug via INTRAVENOUS

## 2011-07-08 MED ORDER — FENTANYL CITRATE 0.05 MG/ML IJ SOLN
INTRAMUSCULAR | Status: AC
  Filled 2011-07-08: qty 4

## 2011-07-08 MED ORDER — FENTANYL CITRATE 0.05 MG/ML IJ SOLN
25.0000 ug | INTRAMUSCULAR | Status: DC | PRN
  Filled 2011-07-08: qty 4

## 2011-07-08 NOTE — ED Notes (Signed)
Discharge instructions given to pt and her caretaker.  Pt taken to caretakers car via wheelchair.

## 2011-07-08 NOTE — H&P (Signed)
Lisa Ibarra is an 50 y.o. female.   Chief Complaint: continued back pain; fell 7 yrs ago  HPI: scheduled for MRI lumbar with sedation  Past Medical History  Diagnosis Date  . Hypertension   . Diabetes mellitus type 2, controlled   . Cannabis abuse   . Hyperlipidemia     Past Surgical History  Procedure Date  . Left eye   . Tubal ligation   . Cesarean section   . Tumor removal from back     No family history on file. Social History:  does not have a smoking history on file. She does not have any smokeless tobacco history on file. Her alcohol and drug histories not on file.  Allergies:  Allergies  Allergen Reactions  . Motrin (Ibuprofen) Nausea And Vomiting    No current outpatient prescriptions on file as of 07/08/2011.   Medications Prior to Admission  Medication Dose Route Frequency Provider Last Rate Last Dose  . fentaNYL (SUBLIMAZE) 0.05 MG/ML injection           . fentaNYL (SUBLIMAZE) injection 25-200 mcg  25-200 mcg Intravenous Q5 min PRN Patric Dykes., MD      . midazolam (VERSED) 2 MG/2ML injection           . midazolam (VERSED) injection 1-10 mg  1-10 mg Intravenous Q5 min PRN Patric Dykes., MD        Results for orders placed during the hospital encounter of 07/08/11 (from the past 48 hour(s))  GLUCOSE, CAPILLARY     Status: Abnormal   Collection Time   07/08/11  8:49 AM      Component Value Range Comment   Glucose-Capillary 117 (*) 70 - 99 (mg/dL)    No results found.  Review of Systems  Constitutional: Negative for fever.  Gastrointestinal: Negative for nausea, vomiting and abdominal pain.  Musculoskeletal: Positive for back pain.    Blood pressure 148/82, pulse 88, temperature 98.2 F (36.8 C), temperature source Oral, resp. rate 18, SpO2 96.00%. Physical Exam  Constitutional: She is oriented to person, place, and time. She appears well-developed and well-nourished.  HENT:  Head: Normocephalic.  Eyes: EOM are normal.  Neck: Normal  range of motion.  Cardiovascular: Normal rate, regular rhythm and normal heart sounds.   No murmur heard. Respiratory: Effort normal. She has no wheezes.  GI: Soft. Bowel sounds are normal. There is no tenderness.  Musculoskeletal: Normal range of motion.       Slow gait; back pain  Neurological: She is alert and oriented to person, place, and time.  Skin: Skin is warm and dry.     Assessment/Plan [name] 7 yrs ago; continued back pain Scheduled for MRI with sedation; Lumbar Pt aware and agreeable to proceed. Consent signed.  Lisa Ibarra A 07/08/2011, 10:01 AM

## 2012-01-11 ENCOUNTER — Emergency Department (HOSPITAL_COMMUNITY)
Admission: EM | Admit: 2012-01-11 | Discharge: 2012-01-11 | Disposition: A | Discharge status: Home / Self Care | Payer: Self-pay | Attending: Emergency Medicine | Admitting: Emergency Medicine

## 2012-01-11 ENCOUNTER — Emergency Department (HOSPITAL_COMMUNITY): Payer: Self-pay

## 2012-01-11 ENCOUNTER — Encounter (HOSPITAL_COMMUNITY): Payer: Self-pay | Admitting: *Deleted

## 2012-01-11 DIAGNOSIS — I10 Essential (primary) hypertension: Secondary | ICD-10-CM | POA: Insufficient documentation

## 2012-01-11 DIAGNOSIS — F17200 Nicotine dependence, unspecified, uncomplicated: Secondary | ICD-10-CM | POA: Insufficient documentation

## 2012-01-11 DIAGNOSIS — E785 Hyperlipidemia, unspecified: Secondary | ICD-10-CM | POA: Insufficient documentation

## 2012-01-11 DIAGNOSIS — B9789 Other viral agents as the cause of diseases classified elsewhere: Secondary | ICD-10-CM | POA: Insufficient documentation

## 2012-01-11 DIAGNOSIS — E119 Type 2 diabetes mellitus without complications: Secondary | ICD-10-CM | POA: Insufficient documentation

## 2012-01-11 DIAGNOSIS — J069 Acute upper respiratory infection, unspecified: Secondary | ICD-10-CM | POA: Insufficient documentation

## 2012-01-11 DIAGNOSIS — F1210 Cannabis abuse, uncomplicated: Secondary | ICD-10-CM | POA: Insufficient documentation

## 2012-01-11 DIAGNOSIS — Z888 Allergy status to other drugs, medicaments and biological substances status: Secondary | ICD-10-CM | POA: Insufficient documentation

## 2012-01-11 MED ORDER — PREDNISONE 10 MG PO TABS: 10.0000 mg | ORAL_TABLET | Freq: Every day | ORAL | Status: DC

## 2012-01-11 MED ORDER — ACETAMINOPHEN-CODEINE 120-12 MG/5ML PO SOLN: 10.0000 mL | ORAL | Status: AC | PRN

## 2012-01-11 MED ORDER — GUAIFENESIN ER 1200 MG PO TB12: 1.0000 | ORAL_TABLET | Freq: Two times a day (BID) | ORAL | Status: DC

## 2012-01-11 NOTE — ED Notes (Signed)
Pt reports ongoing cough/cold x 1 month. Has used Z-Pak- finished 1 week ago. 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 Concerned may be same. Productive clear cough.

## 2012-01-11 NOTE — ED Notes (Signed)
Pt provided instruction to increase water to help liquify mucus, given resources to help locate new providedr d/t HS closing.

## 2012-01-11 NOTE — ED Provider Notes (Signed)
History     CSN: [id number]  Arrival date & time 01/11/12  0740   First MD Initiated Contact with Patient 01/11/12 [phone]      Chief Complaint  Patient presents with  . Cough  . Nasal Congestion    (Consider location/radiation/quality/duration/timing/severity/associated sxs/prior treatment) HPI Patient presents emergency Department with cough, congestion for the last 3 weeks.  Patient, states she was seen by her doctor and given a Z-Pak continues to have cough, and congestion.  Patient states, when she coughs.  It hurts around from her back.  Her chest and ribs.  Patient denies nausea, vomiting, abdominal pain, diarrhea, fevers, fatigue, weakness, headache, or dysuria.  Patient, states that laying down at night makes her cough, worse. Past Medical History  Diagnosis Date  . Hypertension   . Diabetes mellitus type 2, controlled   . Cannabis abuse   . Hyperlipidemia     Past Surgical History  Procedure Date  . Left eye   . Tubal ligation   . Cesarean section   . Tumor removal from back     No family history on file.  History  Substance Use Topics  . Smoking status: Current Everyday Smoker    Types: Cigarettes  . Smokeless tobacco: Not on file  . Alcohol Use: No    OB History    Grav Para Term Preterm Abortions TAB SAB Ect Mult Living                  Review of Systems All pertinent positives and negatives reviewed in the history of present illness  Allergies  Motrin  Home Medications   Current Outpatient Rx  Name Route Sig Dispense Refill  . BENAZEPRIL-HYDROCHLOROTHIAZIDE 20-25 MG PO TABS Oral Take 1 tablet by mouth daily.    Marland Kitchen HYDROCODONE-ACETAMINOPHEN 5-500 MG PO CAPS Oral Take 1 capsule by mouth every 6 (six) hours as needed.    Marland Kitchen HYDROCODONE-ACETAMINOPHEN 10-325 MG PO TABS Oral Take 1 tablet by mouth every 6 (six) hours as needed.    . MELOXICAM 7.5 MG PO TABS Oral Take 7.5 mg by mouth daily.    Marland Kitchen METFORMIN HCL 500 MG PO TABS Oral Take 500 mg by mouth  daily after supper.    Marland Kitchen METOPROLOL TARTRATE 25 MG PO TABS Oral Take 25 mg by mouth 1 day or 1 dose.    Marland Kitchen TIZANIDINE HCL 4 MG PO CAPS Oral Take 4 mg by mouth 3 (three) times daily.      BP 145/92  Pulse 81  Temp 98.1 F (36.7 C) (Oral)  Resp 16  SpO2 95%  LMP 06/12/2011  Physical Exam  Constitutional: She is oriented to person, place, and time. She appears well-developed and well-nourished. No distress.  HENT:  Head: Normocephalic and atraumatic.  Mouth/Throat: Oropharynx is clear and moist. No oropharyngeal exudate.  Eyes: Conjunctivae are normal. Pupils are equal, round, and reactive to light.  Neck: Normal range of motion. Neck supple.  Cardiovascular: Normal rate, regular rhythm and normal heart sounds.  Exam reveals no gallop and no friction rub.   No murmur heard. Pulmonary/Chest: Effort normal and breath sounds normal. No respiratory distress. She has no wheezes. She has no rales. She exhibits tenderness.  Neurological: She is alert and oriented to person, place, and time.  Skin: Skin is warm and dry. No rash noted.    ED Course  Procedures (including critical care time)  The patient most likely has residual symptoms at this point following PO antibiotics.The patient  mainly has an issue with coughing at night when she lays down and pain in her chest wall from the cough. The patient will be treated symptomatically for her condition. She is advised to return here as needed. I advised her to increase her fluid intake as well.    MDM  MDM Reviewed: nursing note and vitals Interpretation: x-ray            Carlyle Dolly, PA-C 01/11/12 [id number]

## 2012-01-13 NOTE — ED Provider Notes (Signed)
Medical screening examination/treatment/procedure(s) were performed by non-physician practitioner and as supervising physician I was immediately available for consultation/collaboration.   Hurman Horn, MD 01/13/12 [phone]

## 2012-04-27 ENCOUNTER — Encounter (HOSPITAL_COMMUNITY): Payer: Self-pay | Admitting: *Deleted

## 2012-04-27 ENCOUNTER — Emergency Department (HOSPITAL_COMMUNITY)
Admission: EM | Admit: 2012-04-27 | Discharge: 2012-04-27 | Disposition: A | Discharge status: Home / Self Care | Payer: Self-pay | Attending: Emergency Medicine | Admitting: Emergency Medicine

## 2012-04-27 ENCOUNTER — Emergency Department (HOSPITAL_COMMUNITY): Payer: Self-pay

## 2012-04-27 DIAGNOSIS — R350 Frequency of micturition: Secondary | ICD-10-CM | POA: Insufficient documentation

## 2012-04-27 DIAGNOSIS — M7989 Other specified soft tissue disorders: Secondary | ICD-10-CM | POA: Insufficient documentation

## 2012-04-27 DIAGNOSIS — E119 Type 2 diabetes mellitus without complications: Secondary | ICD-10-CM | POA: Insufficient documentation

## 2012-04-27 DIAGNOSIS — R059 Cough, unspecified: Secondary | ICD-10-CM | POA: Insufficient documentation

## 2012-04-27 DIAGNOSIS — F17200 Nicotine dependence, unspecified, uncomplicated: Secondary | ICD-10-CM | POA: Insufficient documentation

## 2012-04-27 DIAGNOSIS — F1210 Cannabis abuse, uncomplicated: Secondary | ICD-10-CM | POA: Insufficient documentation

## 2012-04-27 DIAGNOSIS — Z79899 Other long term (current) drug therapy: Secondary | ICD-10-CM | POA: Insufficient documentation

## 2012-04-27 DIAGNOSIS — R42 Dizziness and giddiness: Secondary | ICD-10-CM | POA: Insufficient documentation

## 2012-04-27 DIAGNOSIS — R112 Nausea with vomiting, unspecified: Secondary | ICD-10-CM | POA: Insufficient documentation

## 2012-04-27 DIAGNOSIS — G43909 Migraine, unspecified, not intractable, without status migrainosus: Secondary | ICD-10-CM | POA: Insufficient documentation

## 2012-04-27 DIAGNOSIS — I10 Essential (primary) hypertension: Secondary | ICD-10-CM | POA: Insufficient documentation

## 2012-04-27 DIAGNOSIS — E785 Hyperlipidemia, unspecified: Secondary | ICD-10-CM | POA: Insufficient documentation

## 2012-04-27 DIAGNOSIS — R079 Chest pain, unspecified: Secondary | ICD-10-CM | POA: Insufficient documentation

## 2012-04-27 DIAGNOSIS — R05 Cough: Secondary | ICD-10-CM | POA: Insufficient documentation

## 2012-04-27 LAB — CBC
HCT: 47.6 % — ABNORMAL HIGH (ref 36.0–46.0)
Hemoglobin: 16.6 g/dL — ABNORMAL HIGH (ref 12.0–15.0)
MCH: 30.3 pg (ref 26.0–34.0)
MCHC: 34.9 g/dL (ref 30.0–36.0)
MCV: 86.9 fL (ref 78.0–100.0)
RDW: 13.2 % (ref 11.5–15.5)

## 2012-04-27 LAB — BASIC METABOLIC PANEL
BUN: 12 mg/dL (ref 6–23)
Creatinine, Ser: 0.6 mg/dL (ref 0.50–1.10)
GFR calc Af Amer: >90 mL/min (ref >90)
GFR calc non Af Amer: >90 mL/min (ref >90)
Glucose, Bld: 239 mg/dL — ABNORMAL HIGH (ref 70–99)
Potassium: 3.7 mEq/L (ref 3.5–5.1)

## 2012-04-27 LAB — TROPONIN I: Troponin I: <0.3 ng/mL (ref <0.30)

## 2012-04-27 MED ORDER — BUTALBITAL-APAP-CAFFEINE 50-325-40 MG PO TABS: 1.0000 | ORAL_TABLET | Freq: Four times a day (QID) | ORAL | Status: DC | PRN

## 2012-04-27 NOTE — ED Notes (Signed)
Pt escorted to discharge window. Pt verbalized understanding discharge instructions. In no acute distress.  

## 2012-04-27 NOTE — Progress Notes (Signed)
CM and [facility] liaison spoke with her Pt offered services to assist with finding a [place] self pay provider & health reform information

## 2012-04-27 NOTE — ED Notes (Signed)
Pt states been having headaches, yesterday at [facility] took blood pressure and was 185/110, then today started having midsternal chest pain, states having shortness of breath and blurred vision, also having a migraine.

## 2012-04-27 NOTE — ED Provider Notes (Signed)
History     CSN: [id number]  Arrival date & time 04/27/12  1417   First MD Initiated Contact with Patient 04/27/12 1636      Chief Complaint  Patient presents with  . Hypertension  . Migraine  . Chest Pain    (Consider location/radiation/quality/duration/timing/severity/associated sxs/prior treatment) HPI Comments: Pt here with multiple complaints.   1. Patient presents today for concerns of high blood pressure, checked yesterday at 185/110.    2. Pt states she has also had migraines intermittently for the past three weeks.  The headache begins in her occiput and radiates to forehead, bilaterally, relieved temporarily with goody powders.  She does have associated N/V, blurry vision, which is typical of her migraines.  Pt has had lightheadedness intermittently for the past few weeks.  States her migraine is currently completely relieved.    3. Pt notes she has had three episodes of chest pain today.  Chest pain is located in the middle of her chest, is described as pressure or "dull pain," lasted 10 minutes and resolved spontaneously.  States this occurred once today at rest while at work, once when she was leaving work (still at rest), and once walking from the parking lot to the ED.  No current chest pain.    4. Pt does note she has had cough for several months, productive of "light sputum," occasionally has SOB relieved with one deep cleansing breath.    Denies fevers, abdominal pain, urinary or bowel changes, changes in baseline leg swelling, weakness or numbness of the extremities.    The history is provided by the patient.    Past Medical History  Diagnosis Date  . Hypertension   . Diabetes mellitus type 2, controlled   . Cannabis abuse   . Hyperlipidemia     Past Surgical History  Procedure Date  . Left eye   . Tubal ligation   . Cesarean section   . Tumor removal from back     History reviewed. No pertinent family history.  History  Substance Use Topics  .  Smoking status: Current Every Day Smoker    Types: Cigarettes  . Smokeless tobacco: Not on file  . Alcohol Use: No    OB History    Grav Para Term Preterm Abortions TAB SAB Ect Mult Living                  Review of Systems  Constitutional: Negative for fever and chills.  HENT: Negative for sore throat.   Respiratory: Positive for cough.   Cardiovascular: Positive for chest pain and leg swelling.  Gastrointestinal: Positive for nausea and vomiting. Negative for diarrhea, constipation and blood in stool.  Genitourinary: Positive for frequency. Negative for dysuria and urgency.       Urinary frequency is unchanged x years  Neurological: Positive for light-headedness and headaches. Negative for weakness and numbness.    Allergies  Motrin  Home Medications   Current Outpatient Rx  Name  Route  Sig  Dispense  Refill  . ALBUTEROL SULFATE HFA 108 (90 BASE) MCG/ACT IN AERS   Inhalation   Inhale 2 puffs into the lungs every 6 (six) hours as needed. For shortness of breath.         Marlin Canary HEADACHE PO   Oral   Take 1 packet by mouth 2 (two) times daily as needed. For pain.         Marland Kitchen BENAZEPRIL-HYDROCHLOROTHIAZIDE 20-25 MG PO TABS   Oral   Take  1 tablet by mouth 2 (two) times daily.         Marland Kitchen METFORMIN HCL 500 MG PO TABS   Oral   Take 500 mg by mouth daily after supper.         Marland Kitchen METOPROLOL SUCCINATE ER 50 MG PO TB24   Oral   Take 50 mg by mouth daily. Take with or immediately following a meal.         . PROMETHAZINE HCL 25 MG PO TABS   Oral   Take 25 mg by mouth every 6 (six) hours as needed. For nausea.           BP 134/91  Pulse 93  Temp 98.4 F (36.9 C) (Oral)  Resp 18  SpO2 95%  LMP 06/12/2011  Physical Exam  Nursing note and vitals reviewed. Constitutional: She is oriented to person, place, and time. She appears well-developed and well-nourished. No distress.  HENT:  Head: Normocephalic and atraumatic.  Eyes: Conjunctivae normal are normal.   Neck: Neck supple.  Cardiovascular: Normal rate, regular rhythm and intact distal pulses.   Pulmonary/Chest: Effort normal and breath sounds normal. No respiratory distress. She has no wheezes. She has no rales.  Abdominal: Soft. She exhibits no distension. There is no tenderness. There is no rebound and no guarding.  Musculoskeletal: She exhibits edema. She exhibits no tenderness.  Neurological: She is alert and oriented to person, place, and time. She has normal strength. No cranial nerve deficit or sensory deficit. She exhibits normal muscle tone. GCS eye subscore is 4. GCS verbal subscore is 5. GCS motor subscore is 6.  Skin: She is not diaphoretic.    ED Course  Procedures (including critical care time)  Labs Reviewed  CBC - Abnormal; Notable for the following:    RBC 5.48 (*)     Hemoglobin 16.6 (*)     HCT 47.6 (*)     All other components within normal limits  BASIC METABOLIC PANEL - Abnormal; Notable for the following:    Glucose, Bld 239 (*)     All other components within normal limits  POCT I-STAT TROPONIN I  TROPONIN I   Dg Chest Port 1 View  04/27/2012  *RADIOLOGY REPORT*  Clinical Data: Hypertension.  Chest pain.  PORTABLE CHEST - 1 VIEW  Comparison: Two-view chest 01/11/2012.  Findings: The heart size is normal.  The lungs are clear.  The visualized soft tissues and bony thorax are unremarkable.  IMPRESSION: Negative chest.   Original Report Authenticated By: Marin Roberts, M.D.    Discussed patient with Dr Denton Lank.  Plan for second troponin.  EKG without any concerning or ischemic changes.  May be d/c home after second troponin.    6:50 PM Pt remains asymptomatic.  She is tearful, however, as she has migraines nearly daily and is concerned because she does not have health insurance which keeps her from following up.  Pt has been seen by case management and has been given resources.  I have spoken with her about the new government health insurance, which she knows  about but has not looked into.     Date: 04/27/2012  Rate: 93   Rhythm: normal sinus rhythm  QRS Axis: left  Intervals: normal  ST/T Wave abnormalities: normal  Conduction Disutrbances:none  Narrative Interpretation:   Old EKG Reviewed: unchanged  Filed Vitals:   04/27/12 1902  BP: 135/90  Pulse: 93  Temp: 97.8 F (36.6 C)  Resp: 16     1.  Migraine   2. Chest pain     MDM  Pt with PMH HTN, hyperlipidemia, DM, obesity presenting with concern for elevated BP (taken yesterday).  BP here is 135/90.  Pt also c/o migraine headache, though it was resolved when I saw her.  Pt states she has had migraines since she was a child and this is typical of her migraines, they have not changed in pattern or frequency.  No neurological complaints.  Pt also notes chest pain that occurred three times today, twice at rest.  Pt does have risk factors for CAD but pain today was atypical without associated symptoms, I offered for patient to stay for continued testing (chest pain protocol) but patient declined, stating she wanted to go home.  She had a normal EKG and two negative troponins.  Pt given cardiology follow up for outpatient testing.  Pt also complained of chronic cough, though I only heard one cough in the many times I was in the exam room with her.  Her lungs are CTAB and CXR was negative.  Pt stated she was not out of any of her medications.   Discussed all results with patient.  Pt given return precautions.  Pt verbalizes understanding and agrees with plan.             [place], [place] 04/27/12 [phone]

## 2012-05-01 NOTE — ED Provider Notes (Signed)
Medical screening examination/treatment/procedure(s) were performed by non-physician practitioner and as supervising physician I was immediately available for consultation/collaboration.   Suzi Roots, MD 05/01/12 [phone]

## 2012-05-26 ENCOUNTER — Inpatient Hospital Stay (HOSPITAL_COMMUNITY)
Admission: EM | Admit: 2012-05-26 | Discharge: 2012-05-29 | DRG: 194 | Disposition: A | Discharge status: Home / Self Care | Payer: Medicaid Other | Attending: Internal Medicine | Admitting: Internal Medicine

## 2012-05-26 ENCOUNTER — Emergency Department (HOSPITAL_COMMUNITY): Payer: Medicaid Other

## 2012-05-26 ENCOUNTER — Encounter (HOSPITAL_COMMUNITY): Payer: Self-pay | Admitting: Emergency Medicine

## 2012-05-26 DIAGNOSIS — R197 Diarrhea, unspecified: Secondary | ICD-10-CM | POA: Diagnosis present

## 2012-05-26 DIAGNOSIS — J40 Bronchitis, not specified as acute or chronic: Secondary | ICD-10-CM

## 2012-05-26 DIAGNOSIS — F1210 Cannabis abuse, uncomplicated: Secondary | ICD-10-CM | POA: Diagnosis present

## 2012-05-26 DIAGNOSIS — R0902 Hypoxemia: Secondary | ICD-10-CM

## 2012-05-26 DIAGNOSIS — Z79899 Other long term (current) drug therapy: Secondary | ICD-10-CM

## 2012-05-26 DIAGNOSIS — IMO0002 Reserved for concepts with insufficient information to code with codable children: Secondary | ICD-10-CM | POA: Diagnosis present

## 2012-05-26 DIAGNOSIS — J101 Influenza due to other identified influenza virus with other respiratory manifestations: Principal | ICD-10-CM | POA: Diagnosis present

## 2012-05-26 DIAGNOSIS — F17200 Nicotine dependence, unspecified, uncomplicated: Secondary | ICD-10-CM | POA: Diagnosis present

## 2012-05-26 DIAGNOSIS — J45901 Unspecified asthma with (acute) exacerbation: Secondary | ICD-10-CM | POA: Diagnosis present

## 2012-05-26 DIAGNOSIS — J209 Acute bronchitis, unspecified: Secondary | ICD-10-CM | POA: Diagnosis present

## 2012-05-26 DIAGNOSIS — E119 Type 2 diabetes mellitus without complications: Secondary | ICD-10-CM | POA: Insufficient documentation

## 2012-05-26 DIAGNOSIS — E785 Hyperlipidemia, unspecified: Secondary | ICD-10-CM | POA: Diagnosis present

## 2012-05-26 DIAGNOSIS — I10 Essential (primary) hypertension: Secondary | ICD-10-CM | POA: Diagnosis present

## 2012-05-26 DIAGNOSIS — IMO0001 Reserved for inherently not codable concepts without codable children: Secondary | ICD-10-CM | POA: Diagnosis present

## 2012-05-26 DIAGNOSIS — E1165 Type 2 diabetes mellitus with hyperglycemia: Secondary | ICD-10-CM

## 2012-05-26 HISTORY — DX: Unspecified asthma, uncomplicated: J45.909

## 2012-05-26 LAB — CBC
HCT: 45.3 % (ref 36.0–46.0)
Hemoglobin: 15.6 g/dL — ABNORMAL HIGH (ref 12.0–15.0)
MCH: 29.7 pg (ref 26.0–34.0)
MCHC: 34.4 g/dL (ref 30.0–36.0)
MCV: 86.3 fL (ref 78.0–100.0)
Platelets: 156 10*3/uL (ref 150–400)
RBC: 5.25 MIL/uL — ABNORMAL HIGH (ref 3.87–5.11)
RDW: 13.1 % (ref 11.5–15.5)
WBC: 4.7 10*3/uL (ref 4.0–10.5)

## 2012-05-26 LAB — BASIC METABOLIC PANEL
BUN: 6 mg/dL (ref 6–23)
CO2: 27 mEq/L (ref 19–32)
Calcium: 9.5 mg/dL (ref 8.4–10.5)
Chloride: 99 mEq/L (ref 96–112)
Creatinine, Ser: 0.55 mg/dL (ref 0.50–1.10)
GFR calc Af Amer: >90 mL/min (ref >90)
GFR calc non Af Amer: >90 mL/min (ref >90)
Glucose, Bld: 187 mg/dL — ABNORMAL HIGH (ref 70–99)
Potassium: 3.7 mEq/L (ref 3.5–5.1)
Sodium: 136 mEq/L (ref 135–145)

## 2012-05-26 MED ORDER — MAGNESIUM SULFATE 50 % IJ SOLN
2.0000 g | Freq: Once | INTRAMUSCULAR | Status: DC
  Filled 2012-05-26: qty 4

## 2012-05-26 MED ORDER — METHYLPREDNISOLONE SODIUM SUCC 125 MG IJ SOLR
125.0000 mg | Freq: Once | INTRAMUSCULAR | Status: AC
  Administered 2012-05-26: 125 mg via INTRAVENOUS
  Filled 2012-05-26: qty 2

## 2012-05-26 MED ORDER — ALBUTEROL SULFATE (5 MG/ML) 0.5% IN NEBU
5.0000 mg | INHALATION_SOLUTION | Freq: Once | RESPIRATORY_TRACT | Status: AC
  Administered 2012-05-26: 5 mg via RESPIRATORY_TRACT
  Filled 2012-05-26: qty 1

## 2012-05-26 MED ORDER — SODIUM CHLORIDE 0.9 % IV SOLN
Freq: Once | INTRAVENOUS | Status: AC
  Administered 2012-05-26: 18:00:00 via INTRAVENOUS

## 2012-05-26 MED ORDER — IPRATROPIUM BROMIDE 0.02 % IN SOLN
0.5000 mg | Freq: Once | RESPIRATORY_TRACT | Status: AC
  Administered 2012-05-26: 0.5 mg via RESPIRATORY_TRACT
  Filled 2012-05-26: qty 2.5

## 2012-05-26 MED ORDER — LEVOFLOXACIN IN D5W 750 MG/150ML IV SOLN
750.0000 mg | Freq: Once | INTRAVENOUS | Status: AC
  Administered 2012-05-26: 750 mg via INTRAVENOUS
  Filled 2012-05-26: qty 150

## 2012-05-26 MED ORDER — ALBUTEROL (5 MG/ML) CONTINUOUS INHALATION SOLN
15.0000 mg | INHALATION_SOLUTION | Freq: Once | RESPIRATORY_TRACT | Status: AC
Start: 2012-05-26 — End: 2012-05-26
  Administered 2012-05-26: 15 mg via RESPIRATORY_TRACT

## 2012-05-26 MED ORDER — DIPHENHYDRAMINE HCL 50 MG/ML IJ SOLN
25.0000 mg | Freq: Once | INTRAMUSCULAR | Status: AC
  Administered 2012-05-26: 25 mg via INTRAVENOUS
  Filled 2012-05-26 (×2): qty 1

## 2012-05-26 MED ORDER — METOCLOPRAMIDE HCL 5 MG/ML IJ SOLN
10.0000 mg | Freq: Once | INTRAMUSCULAR | Status: AC
  Administered 2012-05-26: 10 mg via INTRAVENOUS
  Filled 2012-05-26: qty 2

## 2012-05-26 MED ORDER — MAGNESIUM SULFATE 40 MG/ML IJ SOLN
2.0000 g | Freq: Once | INTRAMUSCULAR | Status: AC
  Administered 2012-05-26: 2 g via INTRAVENOUS
  Filled 2012-05-26: qty 50

## 2012-05-26 NOTE — ED Notes (Signed)
MD at bedside. 

## 2012-05-26 NOTE — ED Notes (Signed)
Patient ambulated in hallway and 02 level was 88% to 89%.

## 2012-05-26 NOTE — ED Notes (Signed)
Pt states that she has been out of blood pressure medications and hasn't taken any in weeks.

## 2012-05-26 NOTE — H&P (Signed)
PCP:  None  Chief Complaint:   Shortness of breath  HPI: Lisa Ibarra is a 52 y.o. female   has a past medical history of Hypertension; Diabetes mellitus type 2, controlled; Cannabis abuse; Hyperlipidemia; and Asthma.   Presented with  3 days of increasing wheezing,  Bad cough, sore throat, headache. Yesterday she had a fever up to 101 today she presented to ED due to severe shortness of breath and feeling poorly. Denies muscle aches. She states she had her flu shot this year. Her chest is sore from coughing. In emergency department patient received continuous nebs with much improvement. But then they attempted to ambulate her she desatted down to 88% on room air while walking at which point triad hospitalist was called for admission. Patient also endorses that she got to emerge department she started to have nausea and diarrhea and have had 3 loose bowel movements in the past few hours.  Review of Systems:    Pertinent positives include:Fevers, chills, fatigue, shortness of breath at rest.  dyspnea on exertion, nausea,   non-productive cough, diarrhea,  Constitutional:  No weight loss, night sweats, , weight loss  HEENT:  No headaches, Difficulty swallowing,Tooth/dental problems,Sore throat,  No sneezing, itching, ear ache, nasal congestion, post nasal drip,  Cardio-vascular:  No chest pain, Orthopnea, PND, anasarca, dizziness, palpitations.no Bilateral lower extremity swelling  GI:  No heartburn, indigestion, abdominal pain, vomiting,  change in bowel habits, loss of appetite, melena, blood in stool, hematemesis Resp:  No coughing up of blood.No change in color of mucus.No wheezing. Skin:  no rash or lesions. No jaundice GU:  no dysuria, change in color of urine, no urgency or frequency. No straining to urinate.  No flank pain.  Musculoskeletal:  No joint pain or no joint swelling. No decreased range of motion. No back pain.  Psych:  No change in mood or affect. No depression  or anxiety. No memory loss.  Neuro: no localizing neurological complaints, no tingling, no weakness, no double vision, no gait abnormality, no slurred speech, no confusion  Otherwise ROS are negative except for above, 10 systems were reviewed  Past Medical History: Past Medical History  Diagnosis Date  . Hypertension   . Diabetes mellitus type 2, controlled   . Cannabis abuse   . Hyperlipidemia   . Asthma    Past Surgical History  Procedure Date  . Left eye   . Tubal ligation   . Cesarean section   . Tumor removal from back      Medications: Prior to Admission medications   Medication Sig Start Date End Date Taking? Authorizing Provider  albuterol (PROVENTIL HFA;VENTOLIN HFA) 108 (90 BASE) MCG/ACT inhaler Inhale 2 puffs into the lungs every 6 (six) hours as needed. For shortness of breath.   Yes Historical Provider, MD  Aspirin-Acetaminophen-Caffeine (GOODY HEADACHE PO) Take 1 packet by mouth 2 (two) times daily as needed. For pain.   Yes Historical Provider, MD  benazepril-hydrochlorthiazide (LOTENSIN HCT) 20-25 MG per tablet Take 1 tablet by mouth 2 (two) times daily.   Yes Historical Provider, MD  metFORMIN (GLUCOPHAGE) 500 MG tablet Take 500 mg by mouth daily after supper.   Yes Historical Provider, MD  metoprolol succinate (TOPROL-XL) 50 MG 24 hr tablet Take 50 mg by mouth daily. Take with or immediately following a meal.   Yes Historical Provider, MD    Allergies:   Allergies  Allergen Reactions  . Motrin (Ibuprofen) Nausea And Vomiting    Social History:  Ambulatory independently   Lives at   Home with family   reports that she has been smoking Cigarettes.  She has been smoking about .25 packs per day. She does not have any smokeless tobacco history on file. She reports that she does not drink alcohol or use illicit drugs.   Family History: family history includes Diabetes Mellitus II in her sister; Lung cancer in her father; and Stroke in her mother.     Physical Exam: Patient Vitals for the past 24 hrs:  BP Temp Temp src Pulse Resp SpO2  05/26/12 1931 149/82 mmHg - - 99  20  90 %  05/26/12 1721 - - - - - 91 %  05/26/12 1543 163/102 mmHg 98.4 F (36.9 C) Oral 93  22  91 %    1. General:  in No Acute distress 2. Psychological: Alert and  Oriented 3. Head/ENT:   Moist  Mucous Membranes                          Head Non traumatic, neck supple                          Normal   Dentition 4. SKIN: decreased Skin turgor,  Skin clean Dry and intact no rash 5. Heart: Regular rate and rhythm no Murmur, Rub or gallop 6. Lungs: Somewhat diminished air movement but no wheezes or crackles   could be appreciated  7. Abdomen: Soft, non-tender, Non distended 8. Lower extremities: no clubbing, cyanosis, or edema 9. Neurologically Grossly intact, moving all 4 extremities equally 10. MSK: Normal range of motion  body mass index is unknown because there is no height or weight on file.   Labs on Admission:   [facility] 05/26/12 1714  NA 136  K 3.7  CL 99  CO2 27  GLUCOSE 187*  BUN 6  CREATININE 0.55  CALCIUM 9.5  MG --  PHOS --   No results found for this basename: AST:2,ALT:2,ALKPHOS:2,BILITOT:2,PROT:2,ALBUMIN:2 in the last 72 hours No results found for this basename: LIPASE:2,AMYLASE:2 in the last 72 hours  Basename 05/26/12 1714  WBC 4.7  NEUTROABS --  HGB 15.6*  HCT 45.3  MCV 86.3  PLT 156   No results found for this basename: CKTOTAL:3,CKMB:3,CKMBINDEX:3,TROPONINI:3 in the last 72 hours No results found for this basename: TSH,T4TOTAL,FREET3,T3FREE,THYROIDAB in the last 72 hours No results found for this basename: VITAMINB12:2,FOLATE:2,FERRITIN:2,TIBC:2,IRON:2,RETICCTPCT:2 in the last 72 hours No results found for this basename: HGBA1C    CrCl is unknown because there is no height on file for the current visit. ABG No results found for this basename: phart, pco2, po2, hco3, tco2, acidbasedef, o2sat     No results found  for this basename: DDIMER     Other results:  I have pearsonaly reviewed this: ECG REPORT  Rate: 99  Rhythm: NSR ST&T Change: NO ischemic changes.   Cultures: No results found for this basename: sdes, specrequest, cult, reptstatus       Radiological Exams on Admission: Dg Chest 2 View  05/26/2012  *RADIOLOGY REPORT*  Clinical Data: Smoker with wheezing and shortness of breath. Asthma.  CHEST - 2 VIEW  Comparison: 04/27/2012.  Findings: Normal sized heart.  Clear lungs.  Diffuse peribronchial thickening.  Mild thoracic spine degenerative changes.  IMPRESSION: Mild to moderate bronchitic changes.   Original Report Authenticated By: Beckie Salts, M.D.     Chart has been reviewed  Assessment/Plan  52 year old female with history of asthma tobacco use with viral illness likely leading to asthma exacerbation and now new development of nausea and diarrhea.  Present on Admission:  . Asthma exacerbation - this is fairly mild likely secondary to viral illness. But given that the patient was hypoxic while ambulating we'll admit for observation. We'll write for frequent nebulizer treatments, initiate prednisone taper start Levaquin the patient may have some COPD component given her history of tobacco abuse.  Marland Kitchen HYPERTENSION - continue home medications  . Viral bronchitis  - this is the likely worsening factor in her asthma exacerbation,his symptoms are not very typical of influenza and she has had them for past 3 days hold off on Tamiflu. Influenza PCR is pending   . Diabetes mellitus - sliding scale and hold metformin  . Diarrhea - This is new development will put on enteric precautions obtain stool studies given that they have been an epidemic of gastroenteritis it is possible the patient also started to develop those symptoms.   Prophylaxis:  Lovenox, Protonix  CODE STATUS: FULL CODE  Other plan as per orders.  I have spent a total of 55 min on this  admission  Corrie Reder 05/26/2012, 9:30 PM

## 2012-05-26 NOTE — ED Provider Notes (Signed)
History     CSN: [id number]  Arrival date & time 05/26/12  1525   First MD Initiated Contact with Patient 05/26/12 1605      Chief Complaint  Patient presents with  . Cough    (Consider location/radiation/quality/duration/timing/severity/associated sxs/prior treatment) HPI  Patient reports she started feeling ill 3 days ago with a dry cough with rare dark green sputum production. She states she has a sore throat and feels "my throat is on fire". She also started having some clear rhinorrhea today. She has been wheezing and has nausea without vomiting or diarrhea. She states last night she had fever to 101 and has been having chills. She also complains of headache and has a history of headaches in the past. She has light sensitivity. She denies anybody else being sick at home. She states she did have a flu shot this year.  [facility]  Past Medical History  Diagnosis Date  . Hypertension   . Diabetes mellitus type 2, controlled   . Cannabis abuse   . Hyperlipidemia   . Asthma     Past Surgical History  Procedure Date  . Left eye   . Tubal ligation   . Cesarean section   . Tumor removal from back     History reviewed. No pertinent family history.  History  Substance Use Topics  . Smoking status: Current Every Day Smoker    Types: Cigarettes  . Smokeless tobacco: Not on file  . Alcohol Use: No  employed  OB History    Grav Para Term Preterm Abortions TAB SAB Ect Mult Living                  Review of Systems  All other systems reviewed and are negative.    Allergies  Motrin  Home Medications   Current Outpatient Rx  Name  Route  Sig  Dispense  Refill  . ALBUTEROL SULFATE HFA 108 (90 BASE) MCG/ACT IN AERS   Inhalation   Inhale 2 puffs into the lungs every 6 (six) hours as needed. For shortness of breath.         Marlin Canary HEADACHE PO   Oral   Take 1 packet by mouth 2 (two) times daily as needed. For pain.         Marland Kitchen  BENAZEPRIL-HYDROCHLOROTHIAZIDE 20-25 MG PO TABS   Oral   Take 1 tablet by mouth 2 (two) times daily.         Marland Kitchen BUTALBITAL-APAP-CAFFEINE 50-325-40 MG PO TABS   Oral   Take 1-2 tablets by mouth every 6 (six) hours as needed for headache.   15 tablet   0   . METFORMIN HCL 500 MG PO TABS   Oral   Take 500 mg by mouth daily after supper.         Marland Kitchen METOPROLOL SUCCINATE ER 50 MG PO TB24   Oral   Take 50 mg by mouth daily. Take with or immediately following a meal.         . PROMETHAZINE HCL 25 MG PO TABS   Oral   Take 25 mg by mouth every 6 (six) hours as needed. For nausea.           BP 149/82  Pulse 99  Temp 98.4 F (36.9 C) (Oral)  Resp 20  SpO2 90%  LMP 06/12/2011  Vital signs normal    Physical Exam  Nursing note and vitals reviewed. Constitutional: She is oriented to person, place,  and time. She appears well-developed and well-nourished.  Non-toxic appearance. She does not appear ill. No distress.       obese  HENT:  Head: Normocephalic and atraumatic.  Right Ear: External ear normal.  Left Ear: External ear normal.  Nose: Nose normal. No mucosal edema or rhinorrhea.  Mouth/Throat: Oropharynx is clear and moist and mucous membranes are normal. No dental abscesses or uvula swelling.       Speaking in a whisper until she starts yelling  Eyes: Conjunctivae normal and EOM are normal. Pupils are equal, round, and reactive to light.  Neck: Normal range of motion and full passive range of motion without pain. Neck supple.  Cardiovascular: Normal rate, regular rhythm and normal heart sounds.  Exam reveals no gallop and no friction rub.   No murmur heard. Pulmonary/Chest: She is in respiratory distress. She has wheezes. She has no rhonchi. She has no rales. She exhibits no tenderness and no crepitus.  Abdominal: Soft. Normal appearance and bowel sounds are normal. She exhibits no distension. There is no tenderness. There is no rebound and no guarding.    Musculoskeletal: Normal range of motion. She exhibits no edema and no tenderness.       Moves all extremities well.   Neurological: She is alert and oriented to person, place, and time. She has normal strength. No cranial nerve deficit.  Skin: Skin is warm, dry and intact. No rash noted. No erythema. No pallor.  Psychiatric: She has a normal mood and affect. Her speech is normal and behavior is normal. Her mood appears not anxious.    ED Course  Procedures (including critical care time)   Medications  magnesium sulfate IVPB 2 g 50 mL (2 g Intravenous New Bag/Given 05/26/12 2023)  albuterol (PROVENTIL) (5 MG/ML) 0.5% nebulizer solution 5 mg (5 mg Nebulization Given 05/26/12 1720)  ipratropium (ATROVENT) nebulizer solution 0.5 mg (0.5 mg Nebulization Given 05/26/12 1720)  0.9 %  sodium chloride infusion (  Intravenous New Bag/Given 05/26/12 1733)  metoCLOPramide (REGLAN) injection 10 mg (10 mg Intravenous Given 05/26/12 1733)  diphenhydrAMINE (BENADRYL) injection 25 mg (25 mg Intravenous Given 05/26/12 1733)  albuterol (PROVENTIL,VENTOLIN) solution continuous neb (15 mg Nebulization Given 05/26/12 1845)  ipratropium (ATROVENT) nebulizer solution 0.5 mg (0.5 mg Nebulization Given 05/26/12 1845)  methylPREDNISolone sodium succinate (SOLU-MEDROL) 125 mg/2 mL injection 125 mg (125 mg Intravenous Given 05/26/12 1848)  levofloxacin (LEVAQUIN) IVPB 750 mg (750 mg Intravenous New Bag/Given 05/26/12 1849)   Recheck after first nebulizer shows patient still having diffuse wheezing sometimes audible. At this point she was started on a continuous nebulizer and given IV steroids and IV antibiotics. Her chest x-ray did not show pneumonia but she probably has bronchitis.  Patient was given IV Reglan and Benadryl for her headache.  2030 p.m. recheck after continuous nebulizer shows slightly increased scattered and expiratory wheezing. Patient also seems to be coughing more and more short of breath. Nursing staff report her  pulse ox dropped to 88% when she ambulated on room air. Have discussed with patient she should be admitted and she is agreeable.  2050 p.m. Dr.Doutova will see patient in ED and do her admission orders. Requests influenza test to be done.   Results for orders placed during the hospital encounter of 05/26/12  CBC      Component Value Range   WBC 4.7  4.0 - 10.5 K/uL   RBC 5.25 (*) 3.87 - 5.11 MIL/uL   Hemoglobin 15.6 (*) 12.0 - 15.0 g/dL  HCT 45.3  36.0 - 46.0 %   MCV 86.3  78.0 - 100.0 fL   MCH 29.7  26.0 - 34.0 pg   MCHC 34.4  30.0 - 36.0 g/dL   RDW 16.1  09.6 - 04.5 %   Platelets 156  150 - 400 K/uL  BASIC METABOLIC PANEL      Component Value Range   Sodium 136  135 - 145 mEq/L   Potassium 3.7  3.5 - 5.1 mEq/L   Chloride 99  96 - 112 mEq/L   CO2 27  19 - 32 mEq/L   Glucose, Bld 187 (*) 70 - 99 mg/dL   BUN 6  6 - 23 mg/dL   Creatinine, Ser 4.09  0.50 - 1.10 mg/dL   Calcium 9.5  8.4 - 81.1 mg/dL   GFR calc non Af Amer >90  >90 mL/min   GFR calc Af Amer >90  >90 mL/min   Laboratory interpretation all normal except hyperglycemia    Dg Chest 2 View  05/26/2012  *RADIOLOGY REPORT*  Clinical Data: Smoker with wheezing and shortness of breath. Asthma.  CHEST - 2 VIEW  Comparison: 04/27/2012.  Findings: Normal sized heart.  Clear lungs.  Diffuse peribronchial thickening.  Mild thoracic spine degenerative changes.  IMPRESSION: Mild to moderate bronchitic changes.   Original Report Authenticated By: Beckie Salts, M.D.       1. Asthma with exacerbation   2. Bronchitis   3. Hypoxia     Plan admission  CRITICAL CARE Performed by: Devoria Albe L   Total critical care time: 39 min   Critical care time was exclusive of separately billable procedures and treating other patients.  Critical care was necessary to treat or prevent imminent or life-threatening deterioration.  Critical care was time spent personally by me on the following activities: development of treatment plan with  patient and/or surrogate as well as nursing, discussions with consultants, evaluation of patient's response to treatment, examination of patient, obtaining history from patient or surrogate, ordering and performing treatments and interventions, ordering and review of laboratory studies, ordering and review of radiographic studies, pulse oximetry and re-evaluation of patient's condition.   MDM          Ward Givens, MD 05/26/12 2054

## 2012-05-26 NOTE — ED Notes (Signed)
Nurse tech to transport patient it the floor

## 2012-05-26 NOTE — ED Notes (Addendum)
Pt reports non-productive cough since Wednesday and SOB.  Pt reports h/o asthma, pt has lost her inhaler.

## 2012-05-27 ENCOUNTER — Encounter (HOSPITAL_COMMUNITY): Payer: Self-pay | Admitting: *Deleted

## 2012-05-27 DIAGNOSIS — J209 Acute bronchitis, unspecified: Secondary | ICD-10-CM | POA: Diagnosis present

## 2012-05-27 LAB — COMPREHENSIVE METABOLIC PANEL
Alkaline Phosphatase: 92 U/L (ref 39–117)
BUN: 7 mg/dL (ref 6–23)
GFR calc Af Amer: >90 mL/min (ref >90)
Glucose, Bld: 333 mg/dL — ABNORMAL HIGH (ref 70–99)
Potassium: 3.5 mEq/L (ref 3.5–5.1)
Total Bilirubin: 0.4 mg/dL (ref 0.3–1.2)
Total Protein: 7.4 g/dL (ref 6.0–8.3)

## 2012-05-27 LAB — GLUCOSE, CAPILLARY
Glucose-Capillary: 347 mg/dL — ABNORMAL HIGH (ref 70–99)
Glucose-Capillary: 371 mg/dL — ABNORMAL HIGH (ref 70–99)

## 2012-05-27 LAB — MAGNESIUM: Magnesium: 1.7 mg/dL (ref 1.5–2.5)

## 2012-05-27 LAB — CBC
HCT: 44 % (ref 36.0–46.0)
Platelets: 177 10*3/uL (ref 150–400)
RDW: 13.1 % (ref 11.5–15.5)
WBC: 5 10*3/uL (ref 4.0–10.5)

## 2012-05-27 LAB — HEMOGLOBIN A1C
Hgb A1c MFr Bld: 10.8 % — ABNORMAL HIGH (ref <5.7)
Mean Plasma Glucose: 263 mg/dL — ABNORMAL HIGH (ref <117)

## 2012-05-27 MED ORDER — ALBUTEROL SULFATE (5 MG/ML) 0.5% IN NEBU
2.5000 mg | INHALATION_SOLUTION | Freq: Four times a day (QID) | RESPIRATORY_TRACT | Status: DC
  Administered 2012-05-27: 2.5 mg via RESPIRATORY_TRACT
  Filled 2012-05-27: qty 0.5

## 2012-05-27 MED ORDER — BENAZEPRIL-HYDROCHLOROTHIAZIDE 20-25 MG PO TABS: 1.0000 | ORAL_TABLET | Freq: Two times a day (BID) | ORAL | Status: DC

## 2012-05-27 MED ORDER — ENOXAPARIN SODIUM 80 MG/0.8ML ~~LOC~~ SOLN
65.0000 mg | SUBCUTANEOUS | Status: DC
  Administered 2012-05-27 – 2012-05-29 (×3): 65 mg via SUBCUTANEOUS
  Filled 2012-05-27 (×3): qty 0.8

## 2012-05-27 MED ORDER — ONDANSETRON HCL 4 MG/2ML IJ SOLN: 4.0000 mg | Freq: Four times a day (QID) | INTRAMUSCULAR | Status: DC | PRN

## 2012-05-27 MED ORDER — INSULIN ASPART 100 UNIT/ML ~~LOC~~ SOLN
0.0000 [IU] | Freq: Three times a day (TID) | SUBCUTANEOUS | Status: DC
  Administered 2012-05-27 (×3): 15 [IU] via SUBCUTANEOUS
  Administered 2012-05-28 (×2): 11 [IU] via SUBCUTANEOUS
  Administered 2012-05-28 – 2012-05-29 (×2): 15 [IU] via SUBCUTANEOUS
  Administered 2012-05-29: 5 [IU] via SUBCUTANEOUS

## 2012-05-27 MED ORDER — ACETAMINOPHEN 650 MG RE SUPP: 650.0000 mg | Freq: Four times a day (QID) | RECTAL | Status: DC | PRN

## 2012-05-27 MED ORDER — ACETAMINOPHEN 325 MG PO TABS
650.0000 mg | ORAL_TABLET | Freq: Four times a day (QID) | ORAL | Status: DC | PRN
  Administered 2012-05-28: 650 mg via ORAL
  Filled 2012-05-27: qty 2

## 2012-05-27 MED ORDER — GUAIFENESIN-DM 100-10 MG/5ML PO SYRP
5.0000 mL | ORAL_SOLUTION | ORAL | Status: DC | PRN
  Administered 2012-05-27 – 2012-05-29 (×3): 5 mL via ORAL
  Filled 2012-05-27 (×3): qty 10

## 2012-05-27 MED ORDER — HYDROCHLOROTHIAZIDE 25 MG PO TABS
25.0000 mg | ORAL_TABLET | Freq: Two times a day (BID) | ORAL | Status: DC
  Administered 2012-05-27 – 2012-05-29 (×6): 25 mg via ORAL
  Filled 2012-05-27 (×7): qty 1

## 2012-05-27 MED ORDER — INSULIN ASPART 100 UNIT/ML ~~LOC~~ SOLN
0.0000 [IU] | Freq: Every day | SUBCUTANEOUS | Status: DC
  Administered 2012-05-27 (×2): 4 [IU] via SUBCUTANEOUS
  Administered 2012-05-28: 22:00:00 via SUBCUTANEOUS

## 2012-05-27 MED ORDER — METOPROLOL SUCCINATE ER 50 MG PO TB24
50.0000 mg | ORAL_TABLET | Freq: Every day | ORAL | Status: DC
  Administered 2012-05-27: 50 mg via ORAL
  Filled 2012-05-27: qty 1

## 2012-05-27 MED ORDER — ONDANSETRON HCL 4 MG PO TABS: 4.0000 mg | ORAL_TABLET | Freq: Four times a day (QID) | ORAL | Status: DC | PRN

## 2012-05-27 MED ORDER — POTASSIUM CHLORIDE CRYS ER 20 MEQ PO TBCR
40.0000 meq | EXTENDED_RELEASE_TABLET | Freq: Once | ORAL | Status: AC
  Administered 2012-05-27: 40 meq via ORAL
  Filled 2012-05-27: qty 2

## 2012-05-27 MED ORDER — BENAZEPRIL HCL 20 MG PO TABS
20.0000 mg | ORAL_TABLET | Freq: Two times a day (BID) | ORAL | Status: DC
  Administered 2012-05-27 – 2012-05-29 (×6): 20 mg via ORAL
  Filled 2012-05-27 (×7): qty 1

## 2012-05-27 MED ORDER — HYDROCODONE-ACETAMINOPHEN 5-325 MG PO TABS
1.0000 | ORAL_TABLET | ORAL | Status: DC | PRN
  Administered 2012-05-27 (×3): 1 via ORAL
  Administered 2012-05-28: 2 via ORAL
  Administered 2012-05-28: 1 via ORAL
  Administered 2012-05-28 – 2012-05-29 (×3): 2 via ORAL
  Filled 2012-05-27 (×2): qty 2
  Filled 2012-05-27 (×3): qty 1
  Filled 2012-05-27: qty 2
  Filled 2012-05-27: qty 1
  Filled 2012-05-27: qty 2

## 2012-05-27 MED ORDER — SODIUM CHLORIDE 0.9 % IV SOLN: INTRAVENOUS | Status: AC

## 2012-05-27 MED ORDER — IPRATROPIUM BROMIDE 0.02 % IN SOLN
0.5000 mg | Freq: Four times a day (QID) | RESPIRATORY_TRACT | Status: DC
  Administered 2012-05-27 (×3): 0.5 mg via RESPIRATORY_TRACT
  Filled 2012-05-27 (×3): qty 2.5

## 2012-05-27 MED ORDER — ALBUTEROL SULFATE HFA 108 (90 BASE) MCG/ACT IN AERS
2.0000 | INHALATION_SPRAY | Freq: Two times a day (BID) | RESPIRATORY_TRACT | Status: DC
  Administered 2012-05-27 – 2012-05-29 (×3): 2 via RESPIRATORY_TRACT
  Filled 2012-05-27: qty 6.7

## 2012-05-27 MED ORDER — ALBUTEROL SULFATE (5 MG/ML) 0.5% IN NEBU
2.5000 mg | INHALATION_SOLUTION | RESPIRATORY_TRACT | Status: DC | PRN
  Administered 2012-05-27 – 2012-05-29 (×4): 2.5 mg via RESPIRATORY_TRACT
  Filled 2012-05-27 (×4): qty 0.5

## 2012-05-27 MED ORDER — OSELTAMIVIR PHOSPHATE 75 MG PO CAPS
75.0000 mg | ORAL_CAPSULE | Freq: Two times a day (BID) | ORAL | Status: DC
  Administered 2012-05-27 – 2012-05-29 (×5): 75 mg via ORAL
  Filled 2012-05-27 (×6): qty 1

## 2012-05-27 MED ORDER — HYDRALAZINE HCL 20 MG/ML IJ SOLN
20.0000 mg | Freq: Four times a day (QID) | INTRAMUSCULAR | Status: DC | PRN
  Administered 2012-05-27: 10 mg via INTRAVENOUS

## 2012-05-27 MED ORDER — HYDRALAZINE HCL 20 MG/ML IJ SOLN
10.0000 mg | Freq: Four times a day (QID) | INTRAMUSCULAR | Status: DC | PRN
  Administered 2012-05-27: 10 mg via INTRAVENOUS
  Filled 2012-05-27 (×2): qty 1

## 2012-05-27 MED ORDER — LEVOFLOXACIN 500 MG PO TABS
500.0000 mg | ORAL_TABLET | ORAL | Status: DC
  Administered 2012-05-27 – 2012-05-28 (×2): 500 mg via ORAL
  Filled 2012-05-27 (×3): qty 1

## 2012-05-27 MED ORDER — PREDNISONE 50 MG PO TABS
60.0000 mg | ORAL_TABLET | Freq: Every day | ORAL | Status: DC
  Administered 2012-05-27 – 2012-05-29 (×3): 60 mg via ORAL
  Filled 2012-05-27 (×4): qty 1

## 2012-05-27 MED ORDER — PNEUMOCOCCAL VAC POLYVALENT 25 MCG/0.5ML IJ INJ
0.5000 mL | INJECTION | INTRAMUSCULAR | Status: AC
  Administered 2012-05-28: 0.5 mL via INTRAMUSCULAR
  Filled 2012-05-27 (×2): qty 0.5

## 2012-05-27 MED ORDER — ENOXAPARIN SODIUM 40 MG/0.4ML ~~LOC~~ SOLN: 40.0000 mg | SUBCUTANEOUS | Status: DC

## 2012-05-27 MED ORDER — GUAIFENESIN ER 600 MG PO TB12
600.0000 mg | ORAL_TABLET | Freq: Two times a day (BID) | ORAL | Status: DC
  Administered 2012-05-27 – 2012-05-29 (×5): 600 mg via ORAL
  Filled 2012-05-27 (×6): qty 1

## 2012-05-27 NOTE — Progress Notes (Signed)
[facility] PROGRESS NOTE  JUAN OLTHOFF ZOX:[id number] DOB: [birth year] DOA: 05/26/2012 PCP: No primary provider on file.  Assessment/Plan Acute asthma exacerbation with bronchitis. Continue with scheduled nebs and oral steroid. Continue with Levaquin for associated bronchitis. Patient does give history of flulike symptoms. Influenza PCR pending. - counseled on smoking cessation -Patient also complained of watery diarrhea of 2 episodes in the ED. Stool for C. difficile ordered. No further diarrhea  Diabetes mellitus type II Hemoglobin A1c pending. Continue sliding scale insulin Patient is on daily metformin which is on hold  Hypertension Uncontrolled BP. Continue with home dose of Lotensin.  When necessary hydralazine   Code Status: Full Family Communication: None at bedside Disposition Plan: Home tomorrow if stable   Consultants:  None  Procedures:  None  Antibiotics:  Levaquin  HPI/Subjective: Patient seen and examined this morning. Feels her breathing to be better today.  Objective: Filed Vitals:   05/27/12 0300 05/27/12 0330 05/27/12 0600 05/27/12 0907  BP:  167/101 167/99   Pulse:   98   Temp:   98.6 F (37 C)   TempSrc:   Oral   Resp:   20   Height:      Weight:      SpO2: 88%  93% 95%    Intake/Output Summary (Last 24 hours) at 05/27/12 1032 Last data filed at 05/27/12 0500  Gross per 24 hour  Intake   1540 ml  Output      0 ml  Net   1540 ml   Filed Weights   05/27/12 0020  Weight: 135.807 kg (299 lb 6.4 oz)    Exam:   General:  Middle aged obese female in no acute distress  HEENT: No pallor, moist oral mucosa  Cardiovascular: Normal S1 and S2, no murmurs rub or gallop  Respiratory: Equal air entry bilaterally, scattered expiratory wheezes  Abdomen: Soft, nontender, nondistended, bowel sounds present  Extremities: Warm, no edema  CNS: AAO X3  Data Reviewed: Basic Metabolic Panel:  Lab 05/27/12 8119 05/26/12 1714  NA  131* 136  K 3.5 3.7  CL 94* 99  CO2 24 27  GLUCOSE 333* 187*  BUN 7 6  CREATININE 0.58 0.55  CALCIUM 9.8 9.5  MG 1.7 --  PHOS 2.7 --   Liver Function Tests:  Lab 05/27/12 0513  AST 15  ALT 17  ALKPHOS 92  BILITOT 0.4  PROT 7.4  ALBUMIN 3.3*   No results found for this basename: LIPASE:5,AMYLASE:5 in the last 168 hours No results found for this basename: AMMONIA:5 in the last 168 hours CBC:  Lab 05/27/12 0513 05/26/12 1714  WBC 5.0 4.7  NEUTROABS -- --  HGB 14.8 15.6*  HCT 44.0 45.3  MCV 85.8 86.3  PLT 177 156   Cardiac Enzymes: No results found for this basename: CKTOTAL:5,CKMB:5,CKMBINDEX:5,TROPONINI:5 in the last 168 hours BNP (last 3 results) No results found for this basename: PROBNP:3 in the last 8760 hours CBG:  Lab 05/27/12 0728 05/27/12 0007  GLUCAP 371* 347*    No results found for this or any previous visit (from the past 240 hour(s)).   Studies: Dg Chest 2 View  05/26/2012  *RADIOLOGY REPORT*  Clinical Data: Smoker with wheezing and shortness of breath. Asthma.  CHEST - 2 VIEW  Comparison: 04/27/2012.  Findings: Normal sized heart.  Clear lungs.  Diffuse peribronchial thickening.  Mild thoracic spine degenerative changes.  IMPRESSION: Mild to moderate bronchitic changes.   Original Report Authenticated By: Beckie Salts, M.D.  Scheduled Meds:   . benazepril  20 mg Oral BID   And  . hydrochlorothiazide  25 mg Oral BID  . enoxaparin (LOVENOX) injection  65 mg Subcutaneous Q24H  . guaiFENesin  600 mg Oral BID  . insulin aspart  0-15 Units Subcutaneous TID WC  . insulin aspart  0-5 Units Subcutaneous QHS  . ipratropium  0.5 mg Nebulization Q6H  . levofloxacin  500 mg Oral Q24H  . metoprolol succinate  50 mg Oral Daily  . pneumococcal 23 valent vaccine  0.5 mL Intramuscular Tomorrow-1000  . predniSONE  60 mg Oral Q breakfast   Continuous Infusions:      Time spent: 25 MINUTES    Kourtland Coopman  [facility] Pager [phone]. If  8PM-8AM, please contact night-coverage at [email or web address], password [facility] 05/27/2012, 10:32 AM  LOS: 1 day

## 2012-05-28 DIAGNOSIS — J101 Influenza due to other identified influenza virus with other respiratory manifestations: Secondary | ICD-10-CM | POA: Diagnosis present

## 2012-05-28 LAB — GLUCOSE, CAPILLARY: Glucose-Capillary: 365 mg/dL — ABNORMAL HIGH (ref 70–99)

## 2012-05-28 MED ORDER — METOPROLOL SUCCINATE ER 50 MG PO TB24
50.0000 mg | ORAL_TABLET | Freq: Every day | ORAL | Status: DC
  Administered 2012-05-28 – 2012-05-29 (×2): 50 mg via ORAL
  Filled 2012-05-28 (×3): qty 1

## 2012-05-28 NOTE — Progress Notes (Signed)
Inpatient Diabetes Program Recommendations  AACE/ADA: New Consensus Statement on Inpatient Glycemic Control (2013)  Target Ranges:  Prepandial:   less than 140 mg/dL      Peak postprandial:   less than 180 mg/dL (1-2 hours)      Critically ill patients:  140 - 180 mg/dL   Reason for Visit: Elevated A1C of 10.8 and glucose in the 300's while on steroid therapy.  Inpatient Diabetes Program Recommendations Insulin - Basal: Pt would benefit from basal lantus.  Start with 15 units.  Fasting glucose is in the 300's, getting 15 units correction.  Still remains in 300's throughout the day. Correction (SSI): Continue as ordered. Insulin - Meal Coverage: While on prednisone, also add meal coverage of 3-4 units tidwc.  Note: Pt will likely need some lantus going home as well.  Will watch fasting glucose levels while here. Thank you, Lenor Coffin, RN, CNS, Diabetes Coordinator [phone]) Thank you, Lenor Coffin, RN, CNS, Diabetes Coordinator [phone])

## 2012-05-28 NOTE — Progress Notes (Signed)
[facility] PROGRESS NOTE  WALLACE COGLIANO ZOX:[id number] DOB: [birth year] DOA: 05/26/2012 PCP: No primary provider on file.  Assessment/Plan  Acute asthma exacerbation with bronchitis.  Continue with scheduled nebs and oral steroid. Still has some wheezing and cough Continue with Levaquin for associated bronchitis. ( day 2) - counseled on smoking cessation  -diarrhea resolved dced precaution   Influenza H1N1 positive  symptoms have started for past 6 weeks , however given need for hospitalization we will treat her with tamiflu for 5 days ( as per CDC recommendations to treat immunocompromised and hospitalized patients beyond 48 hrs of onset of symptoms to reduce complications and  Mortality).  Diabetes mellitus type II  Hemoglobin A1c pending.  Continue sliding scale insulin  Patient is on daily metformin which will be resumed  Hypertension  Uncontrolled BP. Continue with home dose of Lotensin.  When necessary hydralazine   Code Status: Full  Family Communication: None at bedside  Disposition Plan: continue scheduled nebs for today. Will d/c in am if improved  Consultants:  None Procedures:  None Antibiotics:  Levaquin   HPI/Subjective: Still has some SOB with wheezing. Flu PCR positive and started on tamiflu. remains afebrile.  Objective: Filed Vitals:   05/27/12 2200 05/28/12 0553 05/28/12 1001 05/28/12 1020  BP: 131/84 122/82 142/71   Pulse: 74 74 79   Temp: 98.1 F (36.7 C) 98.1 F (36.7 C) 98.5 F (36.9 C)   TempSrc: Oral Oral Oral   Resp: 20 20 18    Height:      Weight:      SpO2: 96% 91% 91% 91%    Intake/Output Summary (Last 24 hours) at 05/28/12 1036 Last data filed at 05/28/12 1002  Gross per 24 hour  Intake    360 ml  Output      0 ml  Net    360 ml   Filed Weights   05/27/12 0020  Weight: 135.807 kg (299 lb 6.4 oz)    Exam: General: Middle aged obese female in no acute distress  HEENT: No pallor, moist oral mucosa  Cardiovascular:  Normal S1 and S2, no murmurs rub or gallop  Respiratory: Equal air entry bilaterally, scattered expiratory wheezes ,unchanged from eysterday Abdomen: Soft, nontender, nondistended, bowel sounds present  Extremities: Warm, no edema  CNS: AAO X3     Data Reviewed: Basic Metabolic Panel:  Lab 05/27/12 8119 05/26/12 1714  NA 131* 136  K 3.5 3.7  CL 94* 99  CO2 24 27  GLUCOSE 333* 187*  BUN 7 6  CREATININE 0.58 0.55  CALCIUM 9.8 9.5  MG 1.7 --  PHOS 2.7 --   Liver Function Tests:  Lab 05/27/12 0513  AST 15  ALT 17  ALKPHOS 92  BILITOT 0.4  PROT 7.4  ALBUMIN 3.3*   No results found for this basename: LIPASE:5,AMYLASE:5 in the last 168 hours No results found for this basename: AMMONIA:5 in the last 168 hours CBC:  Lab 05/27/12 0513 05/26/12 1714  WBC 5.0 4.7  NEUTROABS -- --  HGB 14.8 15.6*  HCT 44.0 45.3  MCV 85.8 86.3  PLT 177 156   Cardiac Enzymes: No results found for this basename: CKTOTAL:5,CKMB:5,CKMBINDEX:5,TROPONINI:5 in the last 168 hours BNP (last 3 results) No results found for this basename: PROBNP:3 in the last 8760 hours CBG:  Lab 05/28/12 0720 05/27/12 2138 05/27/12 1625 05/27/12 1146 05/27/12 0728  GLUCAP 306* 302* 364* 410* 371*    No results found for this or any previous visit (from  the past 240 hour(s)).   Studies: Dg Chest 2 View  05/26/2012  *RADIOLOGY REPORT*  Clinical Data: Smoker with wheezing and shortness of breath. Asthma.  CHEST - 2 VIEW  Comparison: 04/27/2012.  Findings: Normal sized heart.  Clear lungs.  Diffuse peribronchial thickening.  Mild thoracic spine degenerative changes.  IMPRESSION: Mild to moderate bronchitic changes.   Original Report Authenticated By: Beckie Salts, M.D.     Scheduled Meds:   . albuterol  2 puff Inhalation BID  . benazepril  20 mg Oral BID   And  . hydrochlorothiazide  25 mg Oral BID  . enoxaparin (LOVENOX) injection  65 mg Subcutaneous Q24H  . guaiFENesin  600 mg Oral BID  . insulin aspart   0-15 Units Subcutaneous TID WC  . insulin aspart  0-5 Units Subcutaneous QHS  . levofloxacin  500 mg Oral Q24H  . oseltamivir  75 mg Oral BID  . pneumococcal 23 valent vaccine  0.5 mL Intramuscular Tomorrow-1000  . predniSONE  60 mg Oral Q breakfast   Continuous Infusions:      Time spent: 25 MINUTES    Heddy Vidana  [facility] Pager [phone]. If 8PM-8AM, please contact night-coverage at [email or web address], password [facility] 05/28/2012, 10:36 AM  LOS: 2 days

## 2012-05-29 DIAGNOSIS — E1165 Type 2 diabetes mellitus with hyperglycemia: Secondary | ICD-10-CM | POA: Diagnosis present

## 2012-05-29 LAB — GLUCOSE, CAPILLARY: Glucose-Capillary: 376 mg/dL — ABNORMAL HIGH (ref 70–99)

## 2012-05-29 MED ORDER — INSULIN NPH (HUMAN) (ISOPHANE) 100 UNIT/ML ~~LOC~~ SUSP: 8.0000 [IU] | Freq: Two times a day (BID) | SUBCUTANEOUS | Status: DC

## 2012-05-29 MED ORDER — METOPROLOL SUCCINATE ER 50 MG PO TB24: 50.0000 mg | ORAL_TABLET | Freq: Every day | ORAL | Status: DC

## 2012-05-29 MED ORDER — METFORMIN HCL 500 MG PO TABS: 1000.0000 mg | ORAL_TABLET | Freq: Two times a day (BID) | ORAL | Status: DC

## 2012-05-29 MED ORDER — OSELTAMIVIR PHOSPHATE 75 MG PO CAPS: 75.0000 mg | ORAL_CAPSULE | Freq: Two times a day (BID) | ORAL | Status: DC

## 2012-05-29 MED ORDER — GUAIFENESIN ER 600 MG PO TB12: 600.0000 mg | ORAL_TABLET | Freq: Two times a day (BID) | ORAL | Status: DC

## 2012-05-29 MED ORDER — INSULIN GLARGINE 100 UNIT/ML ~~LOC~~ SOLN
15.0000 [IU] | Freq: Every day | SUBCUTANEOUS | Status: DC
  Administered 2012-05-29: 15 [IU] via SUBCUTANEOUS

## 2012-05-29 MED ORDER — "BD GETTING STARTED TAKE HOME KIT: 1ML X 30 G SYRINGES, "
1.0000 | Freq: Once | Status: AC
  Administered 2012-05-29: 1
  Filled 2012-05-29: qty 1

## 2012-05-29 MED ORDER — PREDNISONE 50 MG PO TABS: 50.0000 mg | ORAL_TABLET | Freq: Every day | ORAL | Status: DC

## 2012-05-29 MED ORDER — LEVOFLOXACIN 500 MG PO TABS: 500.0000 mg | ORAL_TABLET | ORAL | Status: DC

## 2012-05-29 MED ORDER — BD GETTING STARTED TAKE HOME KIT: 1/2ML X 30G SYRINGES
1.0000 | Freq: Once | Status: DC
Start: 2012-05-29 — End: 2012-05-29
  Filled 2012-05-29: qty 1

## 2012-05-29 MED ORDER — "INSULIN SYRINGE-NEEDLE U-100 25G X 1"" 1 ML MISC": 1.0000 | Freq: Two times a day (BID) | Status: DC

## 2012-05-29 MED ORDER — INSULIN NPH ISOPHANE & REGULAR (70-30) 100 UNIT/ML ~~LOC~~ SUSP: 8.0000 [IU] | Freq: Two times a day (BID) | SUBCUTANEOUS | Status: DC

## 2012-05-29 MED ORDER — ALBUTEROL SULFATE HFA 108 (90 BASE) MCG/ACT IN AERS: 2.0000 | INHALATION_SPRAY | Freq: Four times a day (QID) | RESPIRATORY_TRACT | Status: DC | PRN

## 2012-05-29 MED ORDER — BENAZEPRIL-HYDROCHLOROTHIAZIDE 20-25 MG PO TABS: 1.0000 | ORAL_TABLET | Freq: Two times a day (BID) | ORAL | Status: DC

## 2012-05-29 MED ORDER — ACCU-CHEK MULTICLIX LANCETS MISC: Status: DC

## 2012-05-29 MED ORDER — GLUCOSE BLOOD VI STRP: ORAL_STRIP | Status: DC
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
## 2012-05-29 NOTE — Progress Notes (Signed)
Patient discharge home with friend, alert and oriented, discharge instructions given patient verbalize understanding of discharge instructions given, patient in stable condition at this time, education given about Insulin injections and how to give insulin, also instruction given on how to draw up insulin from insulin vial, patient demonstrated this to Nurse before discharge home, patient also given instructions from Diabetic Educator, patient in stable condition at this time

## 2012-05-29 NOTE — Discharge Summary (Addendum)
Physician Discharge Summary  Lisa Ibarra RUE:[id number] DOB: 09/16/1960 DOA: 05/26/2012  PCP: No primary provider on file.  Admit date: 05/26/2012 Discharge date: 05/29/2012  Time spent: 40 minutes  Recommendations for Outpatient Follow-up:  Home with outpt follow up at [facility] clinic ON 1/17 at 11 am  Discharge Diagnoses:   Principle diagnosis  H1N1 influenza  Active Problems:  Asthma exacerbation  Influenza A H1N1 infection  Uncontrolled diabetes mellitus  Acute bronchitis  Hypertesnion  Diarrhea   Discharge Condition: fair  Diet recommendation: diabetic  Filed Weights   05/27/12 0020  Weight: 135.807 kg (299 lb 6.4 oz)    History of present illness:    Hospital Course:  Acute asthma exacerbation with bronchitis.  Possibly worsened in the setting off underlying influenza. Patient admitted to medical floor and given IV steroids, scheduled nebs and Levaquin for underlying bronchitis. -Patient remains afebrile and clinically improving with her shortness and of breath and wheezing improved as well. She'll be discharged on by mouth prednisone for 5 days, albuterol inhalers and Levaquin to complete 5 day course.  Influenza H1N1 positive  symptoms have started for past 6 days , however given need for hospitalization we will treat her with tamiflu for 5 days ( as per CDC recommendations to treat immunocompromised and hospitalized patients beyond 48 hrs of onset of symptoms to reduce complications and Mortality). -Remains afebrile and clinically improving   Uncontrolled Diabetes mellitus type II  Hemoglobin A1c of 10.8. Patient is on metformin at home and does not seem to be taking after health serv closed. Will increase her metformin dose 2000 mg 2 times a day. She would also need insulin and I will prescribe her with NPH 70/30 , 8 units 2 times a day with meals. -Will prescribe her a diabetic supplies. I have scheduled an appointment for her at [facility] clinic for  06/08/2012 at 11 AM. I have asked her to keep a log of her blood glucose monitoring and bring it to  the clinic. -Patient has been counseled strongly on dietary modifications and regular exercise to reduce weight and provided with educational resources.  Hypertension  Patient has not been taking her blood pressure medication for past few months. Her home dose of Lotensin and metoprolol will resume that her blood pressure is now stable.  Code Status: Full   Consultants:  None Procedures:  None  Antibiotics:  Levaquin    Discharge Exam: Filed Vitals:   05/28/12 2119 05/29/12 0408 05/29/12 0522 05/29/12 0941  BP: 125/79  116/77   Pulse: 69  68   Temp: 98.1 F (36.7 C)  98.1 F (36.7 C)   TempSrc: Oral  Oral   Resp: 18  18   Height:      Weight:      SpO2: 97% 93% 94% 97%   General: Middle aged obese female in no acute distress  HEENT: No pallor, moist oral mucosa  Cardiovascular: Normal S1 and S2, no murmurs rub or gallop  Respiratory: Equal air entry bilaterally, no wheezes or rhonchi Abdomen: Soft, nontender, nondistended, bowel sounds present   Extremities: Warm, no edema  CNS: AAO X3   Discharge Instructions  Discharge Orders    Future Orders Please Complete By Expires   For home use only DME Glucometer          Medication List     As of 05/29/2012 10:50 AM    TAKE these medications         accu-chek multiclix lancets  Use as instructed      albuterol 108 (90 BASE) MCG/ACT inhaler   Commonly known as: PROVENTIL HFA;VENTOLIN HFA   Inhale 2 puffs into the lungs every 6 (six) hours as needed. For shortness of breath.      benazepril-hydrochlorthiazide 20-25 MG per tablet   Commonly known as: LOTENSIN HCT   Take 1 tablet by mouth 2 (two) times daily.      glucose blood test strip   Use as instructed      GOODY HEADACHE PO   Take 1 packet by mouth 2 (two) times daily as needed. For pain.      guaiFENesin 600 MG 12 hr tablet   Commonly known as:  MUCINEX   Take 1 tablet (600 mg total) by mouth 2 (two) times daily.      insulin NPH 70/30  Subcutaneous injection      Inject 8 Units into the skin 2 (two) times daily before a meal.      Insulin Syringe-Needle U-100 25G X 1" 1 ML Misc   1 each by Does not apply route 2 (two) times daily.      levofloxacin 500 MG tablet   Commonly known as: LEVAQUIN   Take 1 tablet (500 mg total) by mouth daily.      metFORMIN 500 MG tablet   Commonly known as: GLUCOPHAGE   Take 2 tablets (1,000 mg total) by mouth 2 (two) times daily with a meal.      metoprolol succinate 50 MG 24 hr tablet   Commonly known as: TOPROL-XL   Take 1 tablet (50 mg total) by mouth daily. Take with or immediately following a meal.      oseltamivir 75 MG capsule   Commonly known as: TAMIFLU   Take 1 capsule (75 mg total) by mouth 2 (two) times daily.      predniSONE 50 MG tablet   Commonly known as: DELTASONE   Take 1 tablet (50 mg total) by mouth daily. 5 DAYS           Follow-up Information    Follow up with [facility]. (in 1 week)           The results of significant diagnostics from this hospitalization (including imaging, microbiology, ancillary and laboratory) are listed below for reference.    Significant Diagnostic Studies: Dg Chest 2 View  05/26/2012  *RADIOLOGY REPORT*  Clinical Data: Smoker with wheezing and shortness of breath. Asthma.  CHEST - 2 VIEW  Comparison: 04/27/2012.  Findings: Normal sized heart.  Clear lungs.  Diffuse peribronchial thickening.  Mild thoracic spine degenerative changes.  IMPRESSION: Mild to moderate bronchitic changes.   Original Report Authenticated By: Beckie Salts, M.D.     Microbiology: No results found for this or any previous visit (from the past 240 hour(s)).   Labs: Basic Metabolic Panel:  Lab 05/27/12 4098 05/26/12 1714  NA 131* 136  K 3.5 3.7  CL 94* 99  CO2 24 27  GLUCOSE 333* 187*  BUN 7 6  CREATININE 0.58 0.55  CALCIUM 9.8 9.5  MG 1.7 --    PHOS 2.7 --   Liver Function Tests:  Lab 05/27/12 0513  AST 15  ALT 17  ALKPHOS 92  BILITOT 0.4  PROT 7.4  ALBUMIN 3.3*   No results found for this basename: LIPASE:5,AMYLASE:5 in the last 168 hours No results found for this basename: AMMONIA:5 in the last 168 hours CBC:  Lab 05/27/12 0513 05/26/12 1714  WBC 5.0 4.7  NEUTROABS -- --  HGB 14.8 15.6*  HCT 44.0 45.3  MCV 85.8 86.3  PLT 177 156   Cardiac Enzymes: No results found for this basename: CKTOTAL:5,CKMB:5,CKMBINDEX:5,TROPONINI:5 in the last 168 hours BNP: BNP (last 3 results) No results found for this basename: PROBNP:3 in the last 8760 hours CBG:  Lab 05/29/12 0732 05/28/12 2134 05/28/12 1728 05/28/12 1143 05/28/12 0720  GLUCAP 228* 298* 345* 365* 306*       Signed:  Jessejames Steelman  [facility] 05/29/2012, 10:50 AM

## 2012-06-05 ENCOUNTER — Encounter (HOSPITAL_COMMUNITY): Payer: Self-pay | Admitting: *Deleted

## 2012-06-05 ENCOUNTER — Emergency Department (HOSPITAL_COMMUNITY)
Admission: EM | Admit: 2012-06-05 | Discharge: 2012-06-05 | Disposition: A | Discharge status: Home / Self Care | Payer: Medicaid Other | Attending: Emergency Medicine | Admitting: Emergency Medicine

## 2012-06-05 DIAGNOSIS — E785 Hyperlipidemia, unspecified: Secondary | ICD-10-CM | POA: Insufficient documentation

## 2012-06-05 DIAGNOSIS — F1210 Cannabis abuse, uncomplicated: Secondary | ICD-10-CM | POA: Insufficient documentation

## 2012-06-05 DIAGNOSIS — E1165 Type 2 diabetes mellitus with hyperglycemia: Secondary | ICD-10-CM

## 2012-06-05 DIAGNOSIS — J45909 Unspecified asthma, uncomplicated: Secondary | ICD-10-CM | POA: Insufficient documentation

## 2012-06-05 DIAGNOSIS — Z792 Long term (current) use of antibiotics: Secondary | ICD-10-CM | POA: Insufficient documentation

## 2012-06-05 DIAGNOSIS — I10 Essential (primary) hypertension: Secondary | ICD-10-CM | POA: Insufficient documentation

## 2012-06-05 DIAGNOSIS — E872 Acidosis, unspecified: Secondary | ICD-10-CM | POA: Insufficient documentation

## 2012-06-05 DIAGNOSIS — IMO0001 Reserved for inherently not codable concepts without codable children: Secondary | ICD-10-CM | POA: Insufficient documentation

## 2012-06-05 DIAGNOSIS — R05 Cough: Secondary | ICD-10-CM | POA: Insufficient documentation

## 2012-06-05 DIAGNOSIS — IMO0002 Reserved for concepts with insufficient information to code with codable children: Secondary | ICD-10-CM | POA: Insufficient documentation

## 2012-06-05 DIAGNOSIS — R059 Cough, unspecified: Secondary | ICD-10-CM | POA: Insufficient documentation

## 2012-06-05 DIAGNOSIS — Z79899 Other long term (current) drug therapy: Secondary | ICD-10-CM | POA: Insufficient documentation

## 2012-06-05 DIAGNOSIS — F17200 Nicotine dependence, unspecified, uncomplicated: Secondary | ICD-10-CM | POA: Insufficient documentation

## 2012-06-05 DIAGNOSIS — R197 Diarrhea, unspecified: Secondary | ICD-10-CM | POA: Insufficient documentation

## 2012-06-05 DIAGNOSIS — R110 Nausea: Secondary | ICD-10-CM | POA: Insufficient documentation

## 2012-06-05 DIAGNOSIS — Z794 Long term (current) use of insulin: Secondary | ICD-10-CM | POA: Insufficient documentation

## 2012-06-05 LAB — COMPREHENSIVE METABOLIC PANEL
AST: 9 U/L (ref 0–37)
Albumin: 3.5 g/dL (ref 3.5–5.2)
Alkaline Phosphatase: 99 U/L (ref 39–117)
BUN: 21 mg/dL (ref 6–23)
Chloride: 93 mEq/L — ABNORMAL LOW (ref 96–112)
Creatinine, Ser: 0.7 mg/dL (ref 0.50–1.10)
Potassium: 3.6 mEq/L (ref 3.5–5.1)
Total Bilirubin: 0.3 mg/dL (ref 0.3–1.2)
Total Protein: 7.1 g/dL (ref 6.0–8.3)

## 2012-06-05 LAB — GLUCOSE, CAPILLARY: Glucose-Capillary: 279 mg/dL — ABNORMAL HIGH (ref 70–99)

## 2012-06-05 LAB — CBC WITH DIFFERENTIAL/PLATELET
Basophils Absolute: 0 10*3/uL (ref 0.0–0.1)
Basophils Relative: 0 % (ref 0–1)
Eosinophils Absolute: 0.2 10*3/uL (ref 0.0–0.7)
MCH: 30.5 pg (ref 26.0–34.0)
MCHC: 34.8 g/dL (ref 30.0–36.0)
Monocytes Relative: 8 % (ref 3–12)
Neutro Abs: 5.6 10*3/uL (ref 1.7–7.7)
Neutrophils Relative %: 49 % (ref 43–77)
RDW: 13 % (ref 11.5–15.5)

## 2012-06-05 LAB — URINALYSIS, ROUTINE W REFLEX MICROSCOPIC
Bilirubin Urine: NEGATIVE
Ketones, ur: NEGATIVE mg/dL
Nitrite: NEGATIVE
Protein, ur: 30 mg/dL — AB
Urobilinogen, UA: 0.2 mg/dL (ref 0.0–1.0)
pH: 5 (ref 5.0–8.0)

## 2012-06-05 LAB — CK TOTAL AND CKMB (NOT AT ARMC)
CK, MB: 1.9 ng/mL (ref 0.3–4.0)
Relative Index: 1.6 (ref 0.0–2.5)
Total CK: 119 U/L (ref 7–177)

## 2012-06-05 LAB — LIPASE, BLOOD: Lipase: 29 U/L (ref 11–59)

## 2012-06-05 LAB — LACTIC ACID, PLASMA: Lactic Acid, Venous: 3.6 mmol/L — ABNORMAL HIGH (ref 0.5–2.2)

## 2012-06-05 MED ORDER — ONDANSETRON HCL 4 MG/2ML IJ SOLN
4.0000 mg | Freq: Once | INTRAMUSCULAR | Status: AC
  Administered 2012-06-05: 4 mg via INTRAVENOUS
  Filled 2012-06-05: qty 2

## 2012-06-05 MED ORDER — INSULIN ASPART PROT & ASPART (70-30 MIX) 100 UNIT/ML ~~LOC~~ SUSP
8.0000 [IU] | Freq: Once | SUBCUTANEOUS | Status: AC
  Administered 2012-06-05: 8 [IU] via SUBCUTANEOUS
  Filled 2012-06-05: qty 3

## 2012-06-05 MED ORDER — SODIUM CHLORIDE 0.9 % IV BOLUS (SEPSIS)
500.0000 mL | Freq: Once | INTRAVENOUS | Status: AC
  Administered 2012-06-05: 500 mL via INTRAVENOUS

## 2012-06-05 MED ORDER — SODIUM CHLORIDE 0.9 % IV BOLUS (SEPSIS)
1000.0000 mL | Freq: Once | INTRAVENOUS | Status: AC
  Administered 2012-06-05: 1000 mL via INTRAVENOUS

## 2012-06-05 MED ORDER — METOPROLOL SUCCINATE ER 50 MG PO TB24: 25.0000 mg | ORAL_TABLET | Freq: Every day | ORAL | Status: DC

## 2012-06-05 MED ORDER — INSULIN ASPART PROT & ASPART (70-30 MIX) 100 UNIT/ML ~~LOC~~ SUSP
8.0000 [IU] | Freq: Once | SUBCUTANEOUS | Status: DC
  Filled 2012-06-05: qty 3

## 2012-06-05 NOTE — ED Notes (Signed)
Nurse Care Manager in with pt now. Pt disgruntled over inability to obtain PCP.

## 2012-06-05 NOTE — ED Provider Notes (Signed)
History     CSN: [id number]  Arrival date & time 06/05/12  1419   First MD Initiated Contact with Patient 06/05/12 1537      No chief complaint on file.   (Consider location/radiation/quality/duration/timing/severity/associated sxs/prior treatment) HPI Comments: Patient presents with multiple complaints. Patient reports that she was hospitalized for 3 days for flu symptoms from January 4 through January 7. She reports that during her the time she was being treated for the flu symptoms she was placed on steroids and her sugars were out of control. She was discharged on insulin which was a new medication for her. She also has not been able to get her Toprol refilled. Patient reports that she has been extremely weak and dizzy. She has nausea but no vomiting. She has had continuous diarrhea since her discharge.   Past Medical History  Diagnosis Date  . Hypertension   . Diabetes mellitus type 2, controlled   . Cannabis abuse   . Hyperlipidemia   . Asthma     Past Surgical History  Procedure Date  . Left eye   . Tubal ligation   . Cesarean section   . Tumor removal from back     Family History  Problem Relation Age of Onset  . Stroke Mother   . Lung cancer Father   . Diabetes Mellitus II Sister     History  Substance Use Topics  . Smoking status: Current Every Day Smoker -- 0.2 packs/day    Types: Cigarettes  . Smokeless tobacco: Not on file  . Alcohol Use: No     Comment: last modearate drinking was 2 years ago    OB History    Grav Para Term Preterm Abortions TAB SAB Ect Mult Living                  Review of Systems  Constitutional: Negative for fever.  Respiratory: Positive for cough.   Cardiovascular: Negative for chest pain.  Gastrointestinal: Positive for nausea and diarrhea. Negative for vomiting and abdominal pain.  Neurological: Positive for dizziness.  All other systems reviewed and are negative.    Allergies  Motrin  Home Medications    Current Outpatient Rx  Name  Route  Sig  Dispense  Refill  . ALBUTEROL SULFATE HFA 108 (90 BASE) MCG/ACT IN AERS   Inhalation   Inhale 2 puffs into the lungs every 6 (six) hours as needed. For shortness of breath.   5 Inhaler   3   . GOODY HEADACHE PO   Oral   Take 1 packet by mouth 2 (two) times daily as needed. For pain.         Marland Kitchen BENAZEPRIL-HYDROCHLOROTHIAZIDE 20-25 MG PO TABS   Oral   Take 1 tablet by mouth 2 (two) times daily.   60 tablet   0   . GUAIFENESIN ER 600 MG PO TB12   Oral   Take 1 tablet (600 mg total) by mouth 2 (two) times daily.   10 tablet   0   . INSULIN ISOPHANE & REGULAR (70-30) 100 UNIT/ML [place] SUSP   Subcutaneous   Inject 8 Units into the skin 2 (two) times daily with a meal.   10 mL   12   . LEVOFLOXACIN 500 MG PO TABS   Oral   Take 500 mg by mouth daily. Finished on 06-03-12         . METFORMIN HCL 500 MG PO TABS   Oral   Take 2 tablets (  1,000 mg total) by mouth 2 (two) times daily with a meal.   60 tablet   0   . METOPROLOL SUCCINATE ER 50 MG PO TB24   Oral   Take 1 tablet (50 mg total) by mouth daily. Take with or immediately following a meal.   30 tablet   0   . OSELTAMIVIR PHOSPHATE 75 MG PO CAPS   Oral   Take 1 capsule (75 mg total) by mouth 2 (two) times daily.   6 capsule   0   . PREDNISONE 50 MG PO TABS   Oral   Take 1 tablet (50 mg total) by mouth daily. 5 DAYS   5 tablet   0   . GLUCOSE BLOOD VI STRP      Use as instructed   100 each   12   . INSULIN SYRINGE-NEEDLE U-100 25G X 1" 1 ML MISC   Does not apply   1 each by Does not apply route 2 (two) times daily.   100 each   0   . ACCU-CHEK MULTICLIX LANCETS MISC      Use as instructed   100 each   12     BP 122/83  Pulse 124  Temp 98.5 F (36.9 C) (Oral)  Resp 18  SpO2 97%  LMP 06/12/2011  Physical Exam  Constitutional: She is oriented to person, place, and time. She appears well-developed and well-nourished. No distress.  HENT:  Head:  Normocephalic and atraumatic.  Right Ear: Hearing normal.  Nose: Nose normal.  Mouth/Throat: Oropharynx is clear and moist and mucous membranes are normal.  Eyes: Conjunctivae normal and EOM are normal. Pupils are equal, round, and reactive to light.  Neck: Normal range of motion. Neck supple.  Cardiovascular: Normal rate, regular rhythm, S1 normal and S2 normal.  Exam reveals no gallop and no friction rub.   No murmur heard. Pulmonary/Chest: Effort normal and breath sounds normal. No respiratory distress. She exhibits no tenderness.  Abdominal: Soft. Normal appearance and bowel sounds are normal. There is no hepatosplenomegaly. There is no tenderness. There is no rebound, no guarding, no tenderness at McBurney's point and negative Murphy's sign. No hernia.  Musculoskeletal: Normal range of motion.  Neurological: She is alert and oriented to person, place, and time. She has normal strength. No cranial nerve deficit or sensory deficit. Coordination normal. GCS eye subscore is 4. GCS verbal subscore is 5. GCS motor subscore is 6.  Skin: Skin is warm, dry and intact. No rash noted. No cyanosis.  Psychiatric: She has a normal mood and affect. Her speech is normal and behavior is normal. Thought content normal.    ED Course  Procedures (including critical care time)  Labs Reviewed  GLUCOSE, CAPILLARY - Abnormal; Notable for the following:    Glucose-Capillary 279 (*)     All other components within normal limits  URINALYSIS, ROUTINE W REFLEX MICROSCOPIC - Abnormal; Notable for the following:    Glucose, UA 250 (*)     Protein, ur 30 (*)     Leukocytes, UA SMALL (*)     All other components within normal limits  URINE MICROSCOPIC-ADD ON - Abnormal; Notable for the following:    Squamous Epithelial / LPF FEW (*)     Bacteria, UA FEW (*)     Casts HYALINE CASTS (*)     All other components within normal limits  CBC WITH DIFFERENTIAL - Abnormal; Notable for the following:    WBC 11.4 (*)  RBC 5.24 (*)     Hemoglobin 16.0 (*)     Lymphs Abs 4.7 (*)     All other components within normal limits  COMPREHENSIVE METABOLIC PANEL - Abnormal; Notable for the following:    Sodium 134 (*)     Chloride 93 (*)     Glucose, Bld 262 (*)     All other components within normal limits  LACTIC ACID, PLASMA - Abnormal; Notable for the following:    Lactic Acid, Venous 3.6 (*)     All other components within normal limits  LIPASE, BLOOD  TROPONIN I  CK TOTAL AND CKMB  LACTIC ACID, PLASMA  URINE CULTURE   No results found.   Diagnosis: Lactic acidosis; uncontrolled diabetes    MDM  Patient presents to the ER for evaluation of continued flulike symptoms. Patient was recently admitted for this and discharged. Patient reports that she is having trouble controlling her glucose. She was started on insulin at discharge, but still is running high, especially at night. As patient was complaining of generalized muscle pain, I lactic acid was ordered because she is on metformin. It was dilated at 3.6. She has a very slight anion gap of 15.  Case was discussed with hospitalist. It is anticipated that the lactic acidosis will clear with IV fluids, but controlling her sugars without the metformin will be difficult. Hospitalist recommended aggressive hydration and discharged with followup in urgent care in the morning to further evaluate glucose control strategies.        Gilda Crease, MD 06/05/12 2157

## 2012-06-05 NOTE — ED Notes (Signed)
Pt states came to hospital on 1/4 for flu like symptoms, was admitted for flu and discharged on the 1/7, was discharged w/ insulin which she has never been on before d/t steroids they were giving her, then also was not discharged w/ a prescription for her toporol. Pt states since the was discharged has been feeling worse, having dizziness, weakness, lethargy, nausea, diarrhea.

## 2012-06-05 NOTE — Progress Notes (Signed)
[facility] ED CM placed a referral to [facility] liaison. No pcp, [place] resident.  Liaison spoke with pt but pt noted to be yelling and stating that she was asked the same questions by 4 different people and speaking about various topics one after another.  Liaison unable to provided needed information for self pay pcps, orange card update and health reform application. CM spoke with pt who continued to be noted to be yelling, tearful and stating that she was asked the same questions by now 5 different people and speaking about various topics one after another. CM redirected pt to remain on goals at hand: 1) to get her a self pay pcp available to see her and manage her chronic conditions by  updating her orange card through [facility] 2) and to provide health reform information available to her.  CM allowed pt to ventilate her feelings about [facility] urgent care cancelling her Friday appointment and not rescheduling it, her last hospitalization and being placed on steroids, her concern with work and taking care of her daughter Pt calmed down and CM able to discuss goal with her, why it was important to answer staff questions to evaluate needs and the services/resources available to assist her and to have liaison see her again. Pt's daughter, Babette Relic at bedside voiced understanding of goal for pt.  Pt agreed to speak again with Liaison again Pt given an appointment with [facility] coordinator to renew orange card that will assign her a new self pay pcp and referred for their CM services Francesco Sor) to assist with education, resources, specialists and medications Pt encouraged to follow up

## 2012-06-06 LAB — URINE CULTURE
Colony Count: NO GROWTH
Culture: NO GROWTH

## 2012-06-06 NOTE — Progress Notes (Signed)
[facility] ED CM updated from [facility] care liaison.  Liaison states [employer] on [address] [place] to confirm pt has 5 refills for Toprol on file that will cost $29 because she is no longer a [facility] patient who received a discount on her medicines and now confirmed as self pay.  Will be set up for appointment to get orange card updated and will be set up for their CM services and an attempt to set her up for health reform will be made

## 2012-06-06 NOTE — Progress Notes (Signed)
[facility] lia[facility] updated ED CM that she called and spoke with pt to review again with her the appointment time for orange card, encouraged to go [facility]l [facility] (informed [facility]) and encouraged to follow through on going to pcp that will be assigned to her for all other medical needs Liaison reports pt noted to need her to repeat various instructions and pt reports her medications causing her to be "loopy"

## 2012-06-06 NOTE — Progress Notes (Signed)
Noted d/c instructions for pt indicate her metformin was d/c and she was given a Rx for toprol

## 2013-03-28 ENCOUNTER — Other Ambulatory Visit: Payer: Self-pay

## 2013-05-21 ENCOUNTER — Emergency Department (HOSPITAL_COMMUNITY)
Admission: EM | Admit: 2013-05-21 | Discharge: 2013-05-21 | Disposition: A | Discharge status: Home / Self Care | Payer: BC Managed Care – PPO | Attending: Emergency Medicine | Admitting: Emergency Medicine

## 2013-05-21 ENCOUNTER — Encounter (HOSPITAL_COMMUNITY): Payer: Self-pay | Admitting: Emergency Medicine

## 2013-05-21 DIAGNOSIS — Z792 Long term (current) use of antibiotics: Secondary | ICD-10-CM | POA: Insufficient documentation

## 2013-05-21 DIAGNOSIS — IMO0002 Reserved for concepts with insufficient information to code with codable children: Secondary | ICD-10-CM | POA: Insufficient documentation

## 2013-05-21 DIAGNOSIS — J45909 Unspecified asthma, uncomplicated: Secondary | ICD-10-CM | POA: Insufficient documentation

## 2013-05-21 DIAGNOSIS — F17200 Nicotine dependence, unspecified, uncomplicated: Secondary | ICD-10-CM | POA: Insufficient documentation

## 2013-05-21 DIAGNOSIS — J329 Chronic sinusitis, unspecified: Secondary | ICD-10-CM

## 2013-05-21 DIAGNOSIS — M2550 Pain in unspecified joint: Secondary | ICD-10-CM | POA: Insufficient documentation

## 2013-05-21 DIAGNOSIS — I10 Essential (primary) hypertension: Secondary | ICD-10-CM | POA: Insufficient documentation

## 2013-05-21 DIAGNOSIS — Z79899 Other long term (current) drug therapy: Secondary | ICD-10-CM | POA: Insufficient documentation

## 2013-05-21 DIAGNOSIS — IMO0001 Reserved for inherently not codable concepts without codable children: Secondary | ICD-10-CM | POA: Insufficient documentation

## 2013-05-21 DIAGNOSIS — Z794 Long term (current) use of insulin: Secondary | ICD-10-CM | POA: Insufficient documentation

## 2013-05-21 DIAGNOSIS — E1165 Type 2 diabetes mellitus with hyperglycemia: Secondary | ICD-10-CM

## 2013-05-21 MED ORDER — ACETAMINOPHEN 500 MG PO TABS
1000.0000 mg | ORAL_TABLET | Freq: Once | ORAL | Status: AC
  Administered 2013-05-21: 1000 mg via ORAL
  Filled 2013-05-21: qty 2

## 2013-05-21 MED ORDER — AMOXICILLIN-POT CLAVULANATE 875-125 MG PO TABS: 1.0000 | ORAL_TABLET | Freq: Two times a day (BID) | ORAL | Status: DC

## 2013-05-21 NOTE — ED Notes (Signed)
Pt reports flu like symptoms x 3 days. Sts nasal and chest congestion, cough and fever.

## 2013-05-27 NOTE — ED Provider Notes (Signed)
CSN: [id number]     Arrival date & time 05/21/13  1062 History   First MD Initiated Contact with Patient 05/21/13 [phone]     No chief complaint on file.  (Consider location/radiation/quality/duration/timing/severity/associated sxs/prior Treatment) HPI Comments: 53 yo female with cough, fever, sinus congestion for 4 days, intermittent, no travel but pt has been around others with URI.  Tolerating po.  Hx of sinusitis.    The history is provided by the patient.    Past Medical History  Diagnosis Date  . Hypertension   . Diabetes mellitus type 2, controlled   . Cannabis abuse   . Hyperlipidemia   . Asthma    Past Surgical History  Procedure Laterality Date  . Left eye    . Tubal ligation    . Cesarean section    . Tumor removal from back     Family History  Problem Relation Age of Onset  . Stroke Mother   . Lung cancer Father   . Diabetes Mellitus II Sister    History  Substance Use Topics  . Smoking status: Current Every Day Smoker -- 0.25 packs/day    Types: Cigarettes  . Smokeless tobacco: Not on file  . Alcohol Use: No     Comment: last modearate drinking was 2 years ago   OB History   Grav Para Term Preterm Abortions TAB SAB Ect Mult Living                 Review of Systems  Constitutional: Positive for fever and chills.  HENT: Positive for congestion.   Eyes: Negative for visual disturbance.  Respiratory: Positive for cough. Negative for shortness of breath.   Cardiovascular: Negative for chest pain.  Gastrointestinal: Negative for vomiting and abdominal pain.  Genitourinary: Negative for dysuria and flank pain.  Musculoskeletal: Positive for arthralgias. Negative for back pain, neck pain and neck stiffness.  Skin: Negative for rash.  Neurological: Negative for light-headedness and headaches.    Allergies  Motrin  Home Medications   Current Outpatient Rx  Name  Route  Sig  Dispense  Refill  . albuterol (PROVENTIL HFA;VENTOLIN HFA) 108 (90 BASE)  MCG/ACT inhaler   Inhalation   Inhale 2 puffs into the lungs every 6 (six) hours as needed. For shortness of breath.   5 Inhaler   3   . amoxicillin-clavulanate (AUGMENTIN) 875-125 MG per tablet   Oral   Take 1 tablet by mouth 2 (two) times daily. One po bid x 7 days   14 tablet   0   . Aspirin-Acetaminophen-Caffeine (GOODY HEADACHE PO)   Oral   Take 1 packet by mouth 2 (two) times daily as needed. For pain.         . benazepril-hydrochlorthiazide (LOTENSIN HCT) 20-25 MG per tablet   Oral   Take 1 tablet by mouth 2 (two) times daily.   60 tablet   0   . glucose blood (GLUCOMETER ELITE TEST STRIPS) test strip      Use as instructed   100 each   12   . guaiFENesin (MUCINEX) 600 MG 12 hr tablet   Oral   Take 1 tablet (600 mg total) by mouth 2 (two) times daily.   10 tablet   0   . insulin NPH-insulin regular (NOVOLIN 70/30) (70-30) 100 UNIT/ML injection   Subcutaneous   Inject 8 Units into the skin 2 (two) times daily with a meal.   10 mL   12   . Insulin Syringe-Needle  U-100 25G X 1" 1 ML MISC   Does not apply   1 each by Does not apply route 2 (two) times daily.   100 each   0   . Lancets (ACCU-CHEK MULTICLIX) lancets      Use as instructed   100 each   12   . levofloxacin (LEVAQUIN) 500 MG tablet   Oral   Take 500 mg by mouth daily. Finished on 06-03-12         . metoprolol succinate (TOPROL-XL) 50 MG 24 hr tablet   Oral   Take 1 tablet (50 mg total) by mouth daily. Take with or immediately following a meal.   30 tablet   0   . metoprolol succinate (TOPROL-XL) 50 MG 24 hr tablet   Oral   Take 1 tablet (50 mg total) by mouth daily.   90 tablet   1   . oseltamivir (TAMIFLU) 75 MG capsule   Oral   Take 1 capsule (75 mg total) by mouth 2 (two) times daily.   6 capsule   0   . predniSONE (DELTASONE) 50 MG tablet   Oral   Take 1 tablet (50 mg total) by mouth daily. 5 DAYS   5 tablet   0    BP 104/75  Pulse 78  Temp(Src) 98 F (36.7 C)  (Oral)  Resp 16  SpO2 97%  LMP 06/12/2011 Physical Exam  Nursing note and vitals reviewed. Constitutional: She is oriented to person, place, and time. She appears well-developed and well-nourished.  HENT:  Head: Normocephalic and atraumatic.  Nasal congestion, mild maxillary sinus tenderness  Eyes: Conjunctivae are normal. Right eye exhibits no discharge. Left eye exhibits no discharge.  Neck: Normal range of motion. Neck supple. No tracheal deviation present.  Cardiovascular: Normal rate and regular rhythm.   Pulmonary/Chest: Effort normal and breath sounds normal.  Musculoskeletal: She exhibits no edema.  Neurological: She is alert and oriented to person, place, and time.  Skin: Skin is warm. No rash noted.  Psychiatric: She has a normal mood and affect.    ED Course  Procedures (including critical care time) Labs Review Labs Reviewed - No data to display Imaging Review No results found. No results found.  EKG Interpretation   None       MDM   1. Sinusitis   2. Uncontrolled diabetes mellitus    Well appearing.  No signs of Mucor on exam. No meningismus. Discussed supportive care, DM control and fup for flu like sxs/ sinusitis. Discussed r/b of abx, pt wishes to trial po abx.   Results and differential diagnosis were discussed with the patient. Close follow up outpatient was discussed, patient comfortable with the plan.   Diagnosis: above    Mariea Clonts, MD 05/27/13 [phone]

## 2013-11-19 ENCOUNTER — Emergency Department (HOSPITAL_COMMUNITY)
Admission: EM | Admit: 2013-11-19 | Discharge: 2013-11-19 | Disposition: A | Discharge status: Home / Self Care | Payer: No Typology Code available for payment source | Attending: Emergency Medicine | Admitting: Emergency Medicine

## 2013-11-19 ENCOUNTER — Encounter (HOSPITAL_COMMUNITY): Payer: Self-pay | Admitting: Emergency Medicine

## 2013-11-19 DIAGNOSIS — S335XXA Sprain of ligaments of lumbar spine, initial encounter: Secondary | ICD-10-CM | POA: Insufficient documentation

## 2013-11-19 DIAGNOSIS — E785 Hyperlipidemia, unspecified: Secondary | ICD-10-CM | POA: Insufficient documentation

## 2013-11-19 DIAGNOSIS — Z79899 Other long term (current) drug therapy: Secondary | ICD-10-CM | POA: Insufficient documentation

## 2013-11-19 DIAGNOSIS — J45909 Unspecified asthma, uncomplicated: Secondary | ICD-10-CM | POA: Insufficient documentation

## 2013-11-19 DIAGNOSIS — I10 Essential (primary) hypertension: Secondary | ICD-10-CM | POA: Insufficient documentation

## 2013-11-19 DIAGNOSIS — Z7982 Long term (current) use of aspirin: Secondary | ICD-10-CM | POA: Insufficient documentation

## 2013-11-19 DIAGNOSIS — X500XXA Overexertion from strenuous movement or load, initial encounter: Secondary | ICD-10-CM | POA: Insufficient documentation

## 2013-11-19 DIAGNOSIS — Y9289 Other specified places as the place of occurrence of the external cause: Secondary | ICD-10-CM | POA: Insufficient documentation

## 2013-11-19 DIAGNOSIS — E119 Type 2 diabetes mellitus without complications: Secondary | ICD-10-CM | POA: Insufficient documentation

## 2013-11-19 DIAGNOSIS — Y9389 Activity, other specified: Secondary | ICD-10-CM | POA: Insufficient documentation

## 2013-11-19 DIAGNOSIS — S39012A Strain of muscle, fascia and tendon of lower back, initial encounter: Secondary | ICD-10-CM

## 2013-11-19 DIAGNOSIS — F17200 Nicotine dependence, unspecified, uncomplicated: Secondary | ICD-10-CM | POA: Insufficient documentation

## 2013-11-19 LAB — URINALYSIS, ROUTINE W REFLEX MICROSCOPIC
Bilirubin Urine: NEGATIVE
Glucose, UA: NEGATIVE mg/dL
Hgb urine dipstick: NEGATIVE
Ketones, ur: NEGATIVE mg/dL
Nitrite: NEGATIVE
Protein, ur: NEGATIVE mg/dL
Specific Gravity, Urine: 1.019 (ref 1.005–1.030)
Urobilinogen, UA: 1 mg/dL (ref 0.0–1.0)
pH: 5 (ref 5.0–8.0)

## 2013-11-19 LAB — CBG MONITORING, ED: GLUCOSE-CAPILLARY: 113 mg/dL — AB (ref 70–99)

## 2013-11-19 LAB — URINE MICROSCOPIC-ADD ON

## 2013-11-19 MED ORDER — ACETAMINOPHEN 500 MG PO TABS
1000.0000 mg | ORAL_TABLET | Freq: Once | ORAL | Status: AC
  Administered 2013-11-19: 1000 mg via ORAL
  Filled 2013-11-19: qty 2

## 2013-11-19 MED ORDER — CYCLOBENZAPRINE HCL 10 MG PO TABS: 10.0000 mg | ORAL_TABLET | Freq: Two times a day (BID) | ORAL | Status: DC | PRN

## 2013-11-19 MED ORDER — HYDROCODONE-ACETAMINOPHEN 5-325 MG PO TABS: 1.0000 | ORAL_TABLET | Freq: Four times a day (QID) | ORAL | Status: DC | PRN

## 2013-11-19 NOTE — Discharge Instructions (Signed)
Take vicodin and flexeril as prescribed. Do NOT drive with them.   Rest for 2 days if possible.   Follow up with your doctor.   Return to ER if you have severe pain, unable to walk, weakness, trouble urinating.

## 2013-11-19 NOTE — ED Notes (Signed)
Per pt, states she turned wrong last Friday, now having increasing left upper back pain

## 2013-11-19 NOTE — ED Provider Notes (Signed)
CSN: [id number]     Arrival date & time 11/19/13  3419 History   First MD Initiated Contact with Patient 11/19/13 [phone]     Chief Complaint  Patient presents with  . Back Pain     (Consider location/radiation/quality/duration/timing/severity/associated sxs/prior Treatment) The history is provided by the patient.  NALINA YEATMAN is a 53 y.o. female hx if HTN, DM here with L sided back pain, flank pain. She was at work 4 days ago and got up after lunch and had sudden onset of L back pain. She states that she may have turned wrong. Denies fall or injury. Denies numbness or weakness or incontinence. Denies dysuria or hematuria. Has diabetes and states sugar is usually under control.    Past Medical History  Diagnosis Date  . Hypertension   . Diabetes mellitus type 2, controlled   . Cannabis abuse   . Hyperlipidemia   . Asthma    Past Surgical History  Procedure Laterality Date  . Left eye    . Tubal ligation    . Cesarean section    . Tumor removal from back     Family History  Problem Relation Age of Onset  . Stroke Mother   . Lung cancer Father   . Diabetes Mellitus II Sister    History  Substance Use Topics  . Smoking status: Current Every Day Smoker -- 0.25 packs/day    Types: Cigarettes  . Smokeless tobacco: Not on file  . Alcohol Use: No     Comment: last modearate drinking was 2 years ago   OB History   Grav Para Term Preterm Abortions TAB SAB Ect Mult Living                 Review of Systems  Musculoskeletal: Positive for back pain.  All other systems reviewed and are negative.     Allergies  Motrin  Home Medications   Prior to Admission medications   Medication Sig Start Date End Date Taking? Authorizing Tamberly Pomplun  albuterol (PROVENTIL HFA;VENTOLIN HFA) 108 (90 BASE) MCG/ACT inhaler Inhale 2 puffs into the lungs every 6 (six) hours as needed. For shortness of breath. 05/29/12  Yes Nishant Dhungel, MD  Aspirin-Acetaminophen-Caffeine (GOODY HEADACHE PO)  Take 1 packet by mouth daily as needed (headache).    Yes Historical Cathrine Krizan, MD  lidocaine (LIDODERM) 5 % Place 1 patch onto the skin daily as needed (hip pain.). Remove & Discard patch within 12 hours or as directed by MD   Yes Historical Zackari Ruane, MD  lisinopril-hydrochlorothiazide (PRINZIDE,ZESTORETIC) 20-12.5 MG per tablet Take 1 tablet by mouth daily.   Yes Historical Catheleen Langhorne, MD  metoprolol succinate (TOPROL-XL) 50 MG 24 hr tablet Take 1 tablet (50 mg total) by mouth daily. Take with or immediately following a meal. 05/29/12  Yes Nishant Dhungel, MD  sitaGLIPtin-metformin (JANUMET) 50-1000 MG per tablet Take 1 tablet by mouth daily.   Yes Historical Tay Whitwell, MD  glucose blood (GLUCOMETER ELITE TEST STRIPS) test strip Use as instructed 05/29/12   Nishant Dhungel, MD   BP 102/71  Pulse 53  Temp(Src) 98.1 F (36.7 C) (Oral)  Resp 18  SpO2 97%  LMP 06/12/2011 Physical Exam  Nursing note and vitals reviewed. Constitutional: She is oriented to person, place, and time.  Slightly uncomfortable   HENT:  Head: Normocephalic.  Mouth/Throat: Oropharynx is clear and moist.  Eyes: Conjunctivae are normal. Pupils are equal, round, and reactive to light.  Neck: Normal range of motion. Neck supple.  Cardiovascular: Normal  rate, regular rhythm and normal heart sounds.   Pulmonary/Chest: Effort normal and breath sounds normal. No respiratory distress. She has no wheezes. She has no rales.  Abdominal: Soft. Bowel sounds are normal.  ? Mild LCVAT vs paralumbar tenderness   Musculoskeletal:  L paralumbar tenderness, no midline tenderness   Neurological: She is alert and oriented to person, place, and time. No cranial nerve deficit. Coordination normal.  No saddle anesthesia, neg straight leg raise   Skin: Skin is warm and dry.  Psychiatric: She has a normal mood and affect. Her behavior is normal. Judgment and thought content normal.    ED Course  Procedures (including critical care time) Labs  Review Labs Reviewed  URINALYSIS, ROUTINE W REFLEX MICROSCOPIC - Abnormal; Notable for the following:    Leukocytes, UA SMALL (*)    All other components within normal limits  CBG MONITORING, ED - Abnormal; Notable for the following:    Glucose-Capillary 113 (*)    All other components within normal limits  URINE MICROSCOPIC-ADD ON    Imaging Review No results found.   EKG Interpretation None      MDM   Final diagnoses:  None    KAIA DEPAOLIS is a 53 y.o. female here with back pain. Likely muscle strain. Low suspicion for stone. Will get UA, if normal, will do not further imaging. She is allergic to motrin so will give vicodin, flexeril. Neuro exam unremarkable so I doubt spinal compression and there is no need of imaging.   10:29 AM UA showed no hematuria. CBG 113. Will d/c home.     Wandra Arthurs, MD 11/19/13 1030

## 2014-02-13 ENCOUNTER — Emergency Department (HOSPITAL_COMMUNITY): Payer: No Typology Code available for payment source

## 2014-02-13 ENCOUNTER — Emergency Department (HOSPITAL_COMMUNITY)
Admission: EM | Admit: 2014-02-13 | Discharge: 2014-02-13 | Disposition: A | Discharge status: Home / Self Care | Payer: No Typology Code available for payment source | Attending: Emergency Medicine | Admitting: Emergency Medicine

## 2014-02-13 ENCOUNTER — Encounter (HOSPITAL_COMMUNITY): Payer: Self-pay | Admitting: Emergency Medicine

## 2014-02-13 DIAGNOSIS — Z79899 Other long term (current) drug therapy: Secondary | ICD-10-CM | POA: Insufficient documentation

## 2014-02-13 DIAGNOSIS — I10 Essential (primary) hypertension: Secondary | ICD-10-CM | POA: Insufficient documentation

## 2014-02-13 DIAGNOSIS — E119 Type 2 diabetes mellitus without complications: Secondary | ICD-10-CM | POA: Diagnosis not present

## 2014-02-13 DIAGNOSIS — R197 Diarrhea, unspecified: Secondary | ICD-10-CM | POA: Diagnosis not present

## 2014-02-13 DIAGNOSIS — F17200 Nicotine dependence, unspecified, uncomplicated: Secondary | ICD-10-CM | POA: Insufficient documentation

## 2014-02-13 DIAGNOSIS — R5381 Other malaise: Secondary | ICD-10-CM | POA: Insufficient documentation

## 2014-02-13 DIAGNOSIS — R5383 Other fatigue: Secondary | ICD-10-CM

## 2014-02-13 DIAGNOSIS — J45901 Unspecified asthma with (acute) exacerbation: Secondary | ICD-10-CM | POA: Diagnosis not present

## 2014-02-13 DIAGNOSIS — J029 Acute pharyngitis, unspecified: Secondary | ICD-10-CM | POA: Diagnosis present

## 2014-02-13 DIAGNOSIS — R1110 Vomiting, unspecified: Secondary | ICD-10-CM | POA: Insufficient documentation

## 2014-02-13 DIAGNOSIS — R079 Chest pain, unspecified: Secondary | ICD-10-CM | POA: Insufficient documentation

## 2014-02-13 DIAGNOSIS — J40 Bronchitis, not specified as acute or chronic: Secondary | ICD-10-CM

## 2014-02-13 LAB — CBG MONITORING, ED: Glucose-Capillary: 148 mg/dL — ABNORMAL HIGH (ref 70–99)

## 2014-02-13 LAB — RAPID STREP SCREEN (MED CTR MEBANE ONLY): Streptococcus, Group A Screen (Direct): NEGATIVE

## 2014-02-13 MED ORDER — ALBUTEROL SULFATE (2.5 MG/3ML) 0.083% IN NEBU
5.0000 mg | INHALATION_SOLUTION | Freq: Once | RESPIRATORY_TRACT | Status: AC
  Administered 2014-02-13: 5 mg via RESPIRATORY_TRACT
  Filled 2014-02-13: qty 6

## 2014-02-13 MED ORDER — ALBUTEROL SULFATE HFA 108 (90 BASE) MCG/ACT IN AERS
2.0000 | INHALATION_SPRAY | RESPIRATORY_TRACT | Status: DC | PRN
  Administered 2014-02-13: 2 via RESPIRATORY_TRACT
  Filled 2014-02-13: qty 6.7

## 2014-02-13 MED ORDER — HYDROCODONE-HOMATROPINE 5-1.5 MG/5ML PO SYRP: 5.0000 mL | ORAL_SOLUTION | Freq: Four times a day (QID) | ORAL | Status: DC | PRN

## 2014-02-13 MED ORDER — AZITHROMYCIN 250 MG PO TABS: ORAL_TABLET | ORAL | Status: DC

## 2014-02-13 MED ORDER — PREDNISONE 20 MG PO TABS: ORAL_TABLET | ORAL | Status: DC

## 2014-02-13 NOTE — ED Notes (Addendum)
Pt presents with NAD- Pt with cold since late Aug. Daughter has the same. URI with HX of Asthma. Pt c/o of "coughing fits" that result in N/V/D "messing up on myself". Denies fever. Pt has not seen PCP however has one. Pt denies CP ans states SOB only with coughing. And pain only with coughing. Pt also c/o of swollen lymph glands. CBG 88 AT 1130.

## 2014-02-13 NOTE — ED Provider Notes (Signed)
CSN: [id number]     Arrival date & time 02/13/14  1436 History   First MD Initiated Contact with Patient 02/13/14 1508     Chief Complaint  Patient presents with  . URI  . Sore Throat     (Consider location/radiation/quality/duration/timing/severity/associated sxs/prior Treatment) HPI Comments: Patient presents with cough and congestion. She's had about a 4 to five-week history of worsening cough and chest congestion. Her cough is productive of yellow-green sputum. She's had some occasional shortness of breath, mostly with coughing spells but no exertional shortness of breath. She's had some posttussive type emesis. She's had some intermittent diarrhea as well. She denies any chest pain other than with coughing. She denies any pleuritic pain. She denies any  leg swelling other than baseline or pain in her legs. She's tried multiple over-the-counter medications without relief.  Patient is a 53 y.o. female presenting with URI and pharyngitis.  URI Presenting symptoms: cough and fatigue   Presenting symptoms: no congestion, no fever and no rhinorrhea   Associated symptoms: wheezing   Associated symptoms: no arthralgias, no headaches and no sneezing   Sore Throat Associated symptoms include chest pain (with coughing) and shortness of breath. Pertinent negatives include no abdominal pain and no headaches.    Past Medical History  Diagnosis Date  . Hypertension   . Diabetes mellitus type 2, controlled   . Cannabis abuse   . Hyperlipidemia   . Asthma    Past Surgical History  Procedure Laterality Date  . Left eye    . Tubal ligation    . Cesarean section    . Tumor removal from back     Family History  Problem Relation Age of Onset  . Stroke Mother   . Lung cancer Father   . Diabetes Mellitus II Sister    History  Substance Use Topics  . Smoking status: Current Every Day Smoker -- 0.25 packs/day    Types: Cigarettes  . Smokeless tobacco: Not on file  . Alcohol Use: No   Comment: last modearate drinking was 2 years ago   OB History   Grav Para Term Preterm Abortions TAB SAB Ect Mult Living                 Review of Systems  Constitutional: Positive for fatigue. Negative for fever, chills and diaphoresis.  HENT: Negative for congestion, rhinorrhea and sneezing.   Eyes: Negative.   Respiratory: Positive for cough, shortness of breath and wheezing. Negative for chest tightness.   Cardiovascular: Positive for chest pain (with coughing). Negative for leg swelling.  Gastrointestinal: Positive for vomiting and diarrhea. Negative for nausea, abdominal pain and blood in stool.  Genitourinary: Negative for frequency, hematuria, flank pain and difficulty urinating.  Musculoskeletal: Negative for arthralgias and back pain.  Skin: Negative for rash.  Neurological: Negative for dizziness, speech difficulty, weakness, numbness and headaches.      Allergies  Motrin  Home Medications   Prior to Admission medications   Medication Sig Start Date End Date Taking? Authorizing Provider  albuterol (PROVENTIL) (2.5 MG/3ML) 0.083% nebulizer solution Take 2.5 mg by nebulization every 4 (four) hours as needed for wheezing or shortness of breath.   Yes Historical Provider, MD  lisinopril-hydrochlorothiazide (PRINZIDE,ZESTORETIC) 20-12.5 MG per tablet Take 1 tablet by mouth daily.   Yes Historical Provider, MD  metFORMIN (GLUCOPHAGE-XR) 750 MG 24 hr tablet Take 750 mg by mouth 2 (two) times daily.   Yes Historical Provider, MD  metoprolol succinate (TOPROL-XL) 50 MG 24  hr tablet Take 1 tablet (50 mg total) by mouth daily. Take with or immediately following a meal. 05/29/12  Yes Nishant Dhungel, MD  azithromycin (ZITHROMAX Z-PAK) 250 MG tablet 2 po day one, then 1 daily x 4 days 02/13/14   Malvin Johns, MD  HYDROcodone-homatropine (HYCODAN) 5-1.5 MG/5ML syrup Take 5 mLs by mouth every 6 (six) hours as needed for cough. 02/13/14   Malvin Johns, MD  predniSONE (DELTASONE) 20 MG  tablet 3 tabs po day one, then 2 po daily x 4 days 02/13/14   Malvin Johns, MD   BP 110/73  Pulse 67  Temp(Src) 98 F (36.7 C) (Oral)  Resp 16  Ht 5\' 9"  (1.753 m)  Wt 310 lb 13.6 oz (141 kg)  BMI 45.88 kg/m2  SpO2 94%  LMP 06/12/2011 Physical Exam  Constitutional: She is oriented to person, place, and time. She appears well-developed and well-nourished.  HENT:  Head: Normocephalic and atraumatic.  Eyes: Pupils are equal, round, and reactive to light.  Neck: Normal range of motion. Neck supple.  Cardiovascular: Normal rate, regular rhythm and normal heart sounds.   Pulmonary/Chest: Effort normal and breath sounds normal. No respiratory distress. She has no wheezes. She has no rales. She exhibits no tenderness.  Abdominal: Soft. Bowel sounds are normal. There is no tenderness. There is no rebound and no guarding.  Musculoskeletal: Normal range of motion. She exhibits no edema.  No calf tenderness  Lymphadenopathy:    She has no cervical adenopathy.  Neurological: She is alert and oriented to person, place, and time.  Skin: Skin is warm and dry. No rash noted.  Psychiatric: She has a normal mood and affect.    ED Course  Procedures (including critical care time) Labs Review Labs Reviewed  CBG MONITORING, ED - Abnormal; Notable for the following:    Glucose-Capillary 148 (*)    All other components within normal limits  RAPID STREP SCREEN  CULTURE, GROUP A STREP    Imaging Review Dg Chest 2 View  02/13/2014   CLINICAL DATA:  Cough and sore throat since August  EXAM: CHEST  2 VIEW  COMPARISON:  05/26/2012  FINDINGS: Cardiac shadow is within normal limits. The lungs are clear bilaterally. No acute bony abnormality is seen.  IMPRESSION: No active cardiopulmonary disease.   Electronically Signed   By: Inez Catalina M.D.   On: 02/13/2014 15:33     EKG Interpretation None      MDM   Final diagnoses:  Bronchitis    Patient presents with worsening cough and chest  congestion. There is no evidence of pneumonia. Given that it's been gone on for 4 or 5 weeks, I will treat her with antibiotics. I will also give her a prednisone burst as well as albuterol inhaler to use at home. She received an albuterol nebulizer treatment prior to my examination that she felt better after that. On my exam her lungs are clear. I don't see other suggestions of PE. Her oxygen saturations are 97% on room air.    Malvin Johns, MD 02/13/14 [phone]

## 2014-02-15 LAB — CULTURE, GROUP A STREP

## 2015-03-31 ENCOUNTER — Emergency Department (HOSPITAL_COMMUNITY)
Admission: EM | Admit: 2015-03-31 | Discharge: 2015-03-31 | Disposition: A | Discharge status: Home / Self Care | Payer: Medicaid Other | Attending: Emergency Medicine | Admitting: Emergency Medicine

## 2015-03-31 ENCOUNTER — Encounter (HOSPITAL_COMMUNITY): Payer: Self-pay | Admitting: Emergency Medicine

## 2015-03-31 DIAGNOSIS — R519 Headache, unspecified: Secondary | ICD-10-CM

## 2015-03-31 DIAGNOSIS — E119 Type 2 diabetes mellitus without complications: Secondary | ICD-10-CM | POA: Insufficient documentation

## 2015-03-31 DIAGNOSIS — Y9289 Other specified places as the place of occurrence of the external cause: Secondary | ICD-10-CM | POA: Insufficient documentation

## 2015-03-31 DIAGNOSIS — Y9389 Activity, other specified: Secondary | ICD-10-CM | POA: Insufficient documentation

## 2015-03-31 DIAGNOSIS — S79912A Unspecified injury of left hip, initial encounter: Secondary | ICD-10-CM | POA: Diagnosis not present

## 2015-03-31 DIAGNOSIS — G8929 Other chronic pain: Secondary | ICD-10-CM | POA: Diagnosis not present

## 2015-03-31 DIAGNOSIS — Z720 Tobacco use: Secondary | ICD-10-CM | POA: Diagnosis not present

## 2015-03-31 DIAGNOSIS — I10 Essential (primary) hypertension: Secondary | ICD-10-CM | POA: Diagnosis not present

## 2015-03-31 DIAGNOSIS — R51 Headache: Secondary | ICD-10-CM

## 2015-03-31 DIAGNOSIS — S0990XA Unspecified injury of head, initial encounter: Secondary | ICD-10-CM | POA: Insufficient documentation

## 2015-03-31 DIAGNOSIS — Z79899 Other long term (current) drug therapy: Secondary | ICD-10-CM | POA: Diagnosis not present

## 2015-03-31 DIAGNOSIS — Y990 Civilian activity done for income or pay: Secondary | ICD-10-CM | POA: Insufficient documentation

## 2015-03-31 DIAGNOSIS — W01198A Fall on same level from slipping, tripping and stumbling with subsequent striking against other object, initial encounter: Secondary | ICD-10-CM | POA: Insufficient documentation

## 2015-03-31 DIAGNOSIS — S199XXA Unspecified injury of neck, initial encounter: Secondary | ICD-10-CM | POA: Diagnosis not present

## 2015-03-31 DIAGNOSIS — J45909 Unspecified asthma, uncomplicated: Secondary | ICD-10-CM | POA: Diagnosis not present

## 2015-03-31 DIAGNOSIS — W19XXXA Unspecified fall, initial encounter: Secondary | ICD-10-CM

## 2015-03-31 MED ORDER — ONDANSETRON 4 MG PO TBDP
4.0000 mg | ORAL_TABLET | Freq: Once | ORAL | Status: AC
  Administered 2015-03-31: 4 mg via ORAL
  Filled 2015-03-31: qty 1

## 2015-03-31 MED ORDER — ACETAMINOPHEN 325 MG PO TABS
650.0000 mg | ORAL_TABLET | Freq: Once | ORAL | Status: AC
  Administered 2015-03-31: 650 mg via ORAL
  Filled 2015-03-31: qty 2

## 2015-03-31 NOTE — ED Provider Notes (Signed)
CSN: [id number]     Arrival date & time 03/31/15  1218 History   First MD Initiated Contact with Patient 03/31/15 1238     Chief Complaint  Patient presents with  . Fall   HPI   Patient reports that she was at work today when she went to pivot on her left leg, left hip gave out causing her to fall hitting her head on a glass window and slumped to the floor. 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   Past Medical History  Diagnosis Date  . Hypertension   . Diabetes mellitus type 2, controlled   . Cannabis abuse   . Hyperlipidemia   . Asthma    Past Surgical History  Procedure Laterality Date  . Left eye    . Tubal ligation    . Cesarean section    . Tumor removal from back     Family History  Problem Relation Age of Onset  . Stroke Mother   . Lung cancer Father   . Diabetes Mellitus II Sister    Social History  Substance Use Topics  . Smoking status: Current Every Day Smoker -- 0.25 packs/day    Types: Cigarettes  . Smokeless tobacco: Not on file  . Alcohol Use: No     Comment: last modearate drinking was 2 years ago   OB History    No data available     Review of Systems  All other systems reviewed and are negative.   Allergies  Motrin  Home Medications   Prior to Admission medications   Medication Sig Start Date End Date Taking? Authorizing Provider  albuterol (PROVENTIL HFA;VENTOLIN HFA) 108 (90 BASE) MCG/ACT inhaler Inhale 2 puffs into the lungs every 6 (six) hours as needed for wheezing or shortness of breath.   Yes Historical Provider, MD  albuterol (PROVENTIL) (2.5 MG/3ML) 0.083% nebulizer solution Take 2.5 mg by nebulization every 4 (four) hours as needed for wheezing or shortness of breath.   Yes Historical Provider, MD  lisinopril-hydrochlorothiazide (PRINZIDE,ZESTORETIC) 20-12.5 MG per tablet Take 1 tablet by mouth daily.   Yes  Historical Provider, MD  metFORMIN (GLUCOPHAGE-XR) 750 MG 24 hr tablet Take 750 mg by mouth 2 (two) times daily.   Yes Historical Provider, MD  metoprolol succinate (TOPROL-XL) 50 MG 24 hr tablet Take 1 tablet (50 mg total) by mouth daily. Take with or immediately following a meal. 05/29/12  Yes Nishant Dhungel, MD   BP 108/69 mmHg  Pulse 65  Temp(Src) 97.9 F (36.6 C) (Oral)  Resp 16  SpO2 92%  LMP 06/12/2011 Physical Exam  Constitutional: She is oriented to person, place, and time. She appears well-developed and well-nourished.  HENT:  Head: Normocephalic and atraumatic.  Eyes: Conjunctivae are normal. Pupils are equal, round, and reactive to light. Right eye exhibits no discharge. Left eye exhibits no discharge. No scleral icterus.  Neck: Normal range of motion. No JVD present. No tracheal deviation present.  Supple full ROM, No C spine tenderness  Pulmonary/Chest: Effort normal and breath sounds normal. No stridor. No respiratory distress. She has no wheezes. She has no rales. She exhibits no tenderness.  Abdominal: Soft. She exhibits no distension and no mass. There is no tenderness. There is no rebound and no guarding.  Musculoskeletal: Normal range of motion. She exhibits tenderness. She exhibits no edema.  TTP of left hip. Pt reports chronic.  Stable  Neurological: She is alert and oriented to person, place, and time. Coordination normal.  Skin: Skin is warm and dry. No rash noted. No erythema. No pallor.  Psychiatric: She has a normal mood and affect. Her behavior is normal. Judgment and thought content normal.  Nursing note and vitals reviewed.   ED Course  Procedures (including critical care time) Labs Review Labs Reviewed - No data to display  Imaging Review No results found. I have personally reviewed and evaluated these images and lab results as part of my medical decision-making.   EKG Interpretation None      MDM   Final diagnoses:  Fall, initial encounter   Acute nonintractable headache, unspecified headache type    Labs:  Imaging:  Consults:  Therapeutics:  Discharge Meds:   Assessment/Plan: Pt presents SP mechanical fall. Pain to left lateral neck and soft tissues with no signs of trauma. HA improved with tylenol no red flags, no need for head CT based on canadian head CT criteria and clinical judgement. Chronic left hip pain, X-ray offered to evaluated for acute changes, patient denies as she feels this is baseline pain and was able to ambulate after the fall. Strict return precautions given.          Okey Regal, PA-C 04/01/15 [place], DO 04/01/15 [phone]

## 2015-03-31 NOTE — Discharge Instructions (Signed)
Please read attached information. If you experience any new or worsening signs or symptoms please return to the emergency room for evaluation. Please follow-up with your primary care provider or specialist as discussed. Please use medication prescribed only as directed and discontinue taking if you have any concerning signs or symptoms.   °

## 2015-03-31 NOTE — Progress Notes (Signed)
Pt confirms her pcp is still Jeanann Lewandowsky

## 2015-03-31 NOTE — ED Notes (Signed)
Patient fell from standing position, hit head on glass door, denies LOC, c/o left sided head pain and neck pain. C-collar in place. A&O x4.   Last VS: 124/80, 76hr, 97%ra, [id number].

## 2015-04-01 ENCOUNTER — Encounter (HOSPITAL_COMMUNITY): Payer: Self-pay | Admitting: Emergency Medicine

## 2015-04-01 ENCOUNTER — Emergency Department (HOSPITAL_COMMUNITY): Payer: Medicaid Other

## 2015-04-01 ENCOUNTER — Emergency Department (HOSPITAL_COMMUNITY)
Admission: EM | Admit: 2015-04-01 | Discharge: 2015-04-01 | Disposition: A | Discharge status: Home / Self Care | Payer: Medicaid Other | Attending: Emergency Medicine | Admitting: Emergency Medicine

## 2015-04-01 DIAGNOSIS — Y9389 Activity, other specified: Secondary | ICD-10-CM | POA: Insufficient documentation

## 2015-04-01 DIAGNOSIS — I10 Essential (primary) hypertension: Secondary | ICD-10-CM | POA: Insufficient documentation

## 2015-04-01 DIAGNOSIS — Z79899 Other long term (current) drug therapy: Secondary | ICD-10-CM | POA: Insufficient documentation

## 2015-04-01 DIAGNOSIS — Y998 Other external cause status: Secondary | ICD-10-CM | POA: Insufficient documentation

## 2015-04-01 DIAGNOSIS — Z7984 Long term (current) use of oral hypoglycemic drugs: Secondary | ICD-10-CM | POA: Diagnosis not present

## 2015-04-01 DIAGNOSIS — R1032 Left lower quadrant pain: Secondary | ICD-10-CM | POA: Diagnosis not present

## 2015-04-01 DIAGNOSIS — E119 Type 2 diabetes mellitus without complications: Secondary | ICD-10-CM | POA: Insufficient documentation

## 2015-04-01 DIAGNOSIS — R1030 Lower abdominal pain, unspecified: Secondary | ICD-10-CM | POA: Diagnosis present

## 2015-04-01 DIAGNOSIS — Z720 Tobacco use: Secondary | ICD-10-CM | POA: Diagnosis not present

## 2015-04-01 DIAGNOSIS — W2209XA Striking against other stationary object, initial encounter: Secondary | ICD-10-CM | POA: Insufficient documentation

## 2015-04-01 DIAGNOSIS — R109 Unspecified abdominal pain: Secondary | ICD-10-CM

## 2015-04-01 DIAGNOSIS — Y9289 Other specified places as the place of occurrence of the external cause: Secondary | ICD-10-CM | POA: Insufficient documentation

## 2015-04-01 DIAGNOSIS — W19XXXA Unspecified fall, initial encounter: Secondary | ICD-10-CM

## 2015-04-01 DIAGNOSIS — J45909 Unspecified asthma, uncomplicated: Secondary | ICD-10-CM | POA: Diagnosis not present

## 2015-04-01 LAB — URINALYSIS, ROUTINE W REFLEX MICROSCOPIC
BILIRUBIN URINE: NEGATIVE
Glucose, UA: NEGATIVE mg/dL
Hgb urine dipstick: NEGATIVE
Ketones, ur: NEGATIVE mg/dL
Leukocytes, UA: NEGATIVE
NITRITE: NEGATIVE
PH: 5 (ref 5.0–8.0)
Protein, ur: NEGATIVE mg/dL
SPECIFIC GRAVITY, URINE: 1.011 (ref 1.005–1.030)
Urobilinogen, UA: 1 mg/dL (ref 0.0–1.0)

## 2015-04-01 LAB — COMPREHENSIVE METABOLIC PANEL
ALBUMIN: 4 g/dL (ref 3.5–5.0)
ALT: 12 U/L — AB (ref 14–54)
AST: 15 U/L (ref 15–41)
Alkaline Phosphatase: 72 U/L (ref 38–126)
Anion gap: 10 (ref 5–15)
BUN: 10 mg/dL (ref 6–20)
CHLORIDE: 105 mmol/L (ref 101–111)
CO2: 22 mmol/L (ref 22–32)
CREATININE: 0.74 mg/dL (ref 0.44–1.00)
Calcium: 9.4 mg/dL (ref 8.9–10.3)
GFR calc Af Amer: >60 mL/min (ref >60)
GLUCOSE: 137 mg/dL — AB (ref 65–99)
POTASSIUM: 3.7 mmol/L (ref 3.5–5.1)
SODIUM: 137 mmol/L (ref 135–145)
Total Bilirubin: 0.8 mg/dL (ref 0.3–1.2)
Total Protein: 7.4 g/dL (ref 6.5–8.1)

## 2015-04-01 LAB — CBC WITH DIFFERENTIAL/PLATELET
BASOS ABS: 0 10*3/uL (ref 0.0–0.1)
Basophils Relative: 0 %
Eosinophils Absolute: 0.4 10*3/uL (ref 0.0–0.7)
Eosinophils Relative: 6 %
HEMATOCRIT: 43.9 % (ref 36.0–46.0)
Hemoglobin: 14.8 g/dL (ref 12.0–15.0)
LYMPHS ABS: 2.4 10*3/uL (ref 0.7–4.0)
LYMPHS PCT: 40 %
MCH: 31.2 pg (ref 26.0–34.0)
MCHC: 33.7 g/dL (ref 30.0–36.0)
MCV: 92.6 fL (ref 78.0–100.0)
MONO ABS: 0.5 10*3/uL (ref 0.1–1.0)
MONOS PCT: 8 %
NEUTROS ABS: 2.7 10*3/uL (ref 1.7–7.7)
Neutrophils Relative %: 46 %
Platelets: 212 10*3/uL (ref 150–400)
RBC: 4.74 MIL/uL (ref 3.87–5.11)
RDW: 13.2 % (ref 11.5–15.5)
WBC: 5.9 10*3/uL (ref 4.0–10.5)

## 2015-04-01 LAB — LIPASE, BLOOD: LIPASE: 39 U/L (ref 11–51)

## 2015-04-01 MED ORDER — ONDANSETRON HCL 4 MG/2ML IJ SOLN
4.0000 mg | Freq: Once | INTRAMUSCULAR | Status: AC
  Administered 2015-04-01: 4 mg via INTRAVENOUS
  Filled 2015-04-01: qty 2

## 2015-04-01 MED ORDER — IOHEXOL 300 MG/ML  SOLN
100.0000 mL | Freq: Once | INTRAMUSCULAR | Status: AC | PRN
  Administered 2015-04-01: 100 mL via INTRAVENOUS

## 2015-04-01 NOTE — ED Provider Notes (Signed)
CSN: [id number]     Arrival date & time 04/01/15  9211 History   First MD Initiated Contact with Patient 04/01/15 [phone]     Chief Complaint  Patient presents with  . Fall  . Abdominal Pain     (Consider location/radiation/quality/duration/timing/severity/associated sxs/prior Treatment) HPI Comments: Pt comes in with c/o lower abdominal pain that started after a fall yesterday. Denies numbness or weakness or loc with fall. She was seen yesterday after the fall but didn't have the abdominal pain. Denies fever. Has nausea without vomiting or diarrhea. She states that it feels like severe menstrual cramps but she hasn't had her period in 3 years.  The history is provided by the patient.    Past Medical History  Diagnosis Date  . Hypertension   . Diabetes mellitus type 2, controlled ([place])   . Cannabis abuse   . Hyperlipidemia   . Asthma    Past Surgical History  Procedure Laterality Date  . Left eye    . Tubal ligation    . Cesarean section    . Tumor removal from back     Family History  Problem Relation Age of Onset  . Stroke Mother   . Lung cancer Father   . Diabetes Mellitus II Sister    Social History  Substance Use Topics  . Smoking status: Current Every Day Smoker -- 0.25 packs/day    Types: Cigarettes  . Smokeless tobacco: None  . Alcohol Use: No     Comment: last modearate drinking was 2 years ago   OB History    No data available     Review of Systems  All other systems reviewed and are negative.     Allergies  Motrin  Home Medications   Prior to Admission medications   Medication Sig Start Date End Date Taking? Authorizing Provider  albuterol (PROVENTIL HFA;VENTOLIN HFA) 108 (90 BASE) MCG/ACT inhaler Inhale 2 puffs into the lungs every 6 (six) hours as needed for wheezing or shortness of breath.    Historical Provider, MD  albuterol (PROVENTIL) (2.5 MG/3ML) 0.083% nebulizer solution Take 2.5 mg by nebulization every 4 (four) hours as needed for  wheezing or shortness of breath.    Historical Provider, MD  lisinopril-hydrochlorothiazide (PRINZIDE,ZESTORETIC) 20-12.5 MG per tablet Take 1 tablet by mouth daily.    Historical Provider, MD  metFORMIN (GLUCOPHAGE-XR) 750 MG 24 hr tablet Take 750 mg by mouth 2 (two) times daily.    Historical Provider, MD  metoprolol succinate (TOPROL-XL) 50 MG 24 hr tablet Take 1 tablet (50 mg total) by mouth daily. Take with or immediately following a meal. 05/29/12   Nishant Dhungel, MD   BP 139/81 mmHg  Pulse 71  Temp(Src) 97.8 F (36.6 C) (Oral)  Resp 18  SpO2 99%  LMP 06/12/2011 Physical Exam  Constitutional: She is oriented to person, place, and time. She appears well-developed and well-nourished.  Cardiovascular: Normal rate and regular rhythm.   Pulmonary/Chest: Effort normal and breath sounds normal.  Abdominal: Soft. Bowel sounds are normal. There is tenderness in the left lower quadrant.  Musculoskeletal: Normal range of motion.  Neurological: She is alert and oriented to person, place, and time.  Skin: Skin is warm and dry.  Psychiatric: She has a normal mood and affect.    ED Course  Procedures (including critical care time) Labs Review Labs Reviewed  COMPREHENSIVE METABOLIC PANEL - Abnormal; Notable for the following:    Glucose, Bld 137 (*)    ALT 12 (*)  All other components within normal limits  LIPASE, BLOOD  CBC WITH DIFFERENTIAL/PLATELET  URINALYSIS, ROUTINE W REFLEX MICROSCOPIC (NOT AT [facility])    Imaging Review Ct Abdomen Pelvis W Contrast  04/01/2015  CLINICAL DATA:  54 year old female with history of trauma from a fall yesterday complaining of pain in the pelvic region. Left upper to lower abdominal pain and nausea. EXAM: CT ABDOMEN AND PELVIS WITH CONTRAST TECHNIQUE: Multidetector CT imaging of the abdomen and pelvis was performed using the standard protocol following bolus administration of intravenous contrast. CONTRAST:  121mL OMNIPAQUE IOHEXOL 300 MG/ML  SOLN  COMPARISON:  No priors. FINDINGS: Lower chest:  Unremarkable. Hepatobiliary: No cystic or solid hepatic lesions. No signs of acute traumatic injury to the liver. Multiple noncalcified gallstones are noted in the gallbladder. No current findings to suggest an acute cholecystitis at this time. No intra or extrahepatic biliary ductal dilatation. Pancreas: No pancreatic mass. No pancreatic ductal dilatation. No pancreatic or peripancreatic fluid or inflammatory changes to suggest acute traumatic injury to this pancreas. Spleen: No evidence of acute traumatic injury to the spleen. Adrenals/Urinary Tract: Tiny 8 mm nodule in the lateral limb of the left adrenal gland is too small to characterize. 1.2 cm low-attenuation (5 HU) nodule in the medial limb of the right adrenal gland is compatible with an adenoma. No signs of acute traumatic injury to either kidney. No suspicious cystic or solid renal lesions. No intra or extrahepatic biliary ductal dilatation. No hydroureteronephrosis. Urinary bladder is normal in appearance. Stomach/Bowel: Normal appearance of the stomach. No pathologic dilatation of small bowel or colon. A few scattered colonic diverticulae are noted, without surrounding inflammatory changes to suggest an acute diverticulitis at this time. Normal appendix. Vascular/Lymphatic: No evidence of acute traumatic injury to the major arteries or veins of the abdomen or pelvis. Atherosclerosis throughout the abdominal and pelvic vasculature, without evidence of aneurysm or dissection. No lymphadenopathy noted in the abdomen or pelvis. Reproductive: Uterus and ovaries are unremarkable in appearance. Other: No high attenuation fluid in the peritoneal cavity or retroperitoneum to suggest posttraumatic hemorrhage. No significant volume of ascites. No pneumoperitoneum. Musculoskeletal: No acute displaced fracture or aggressive appearing lytic or blastic lesions are noted in the visualized portions of the skeleton.  Advanced degenerative changes of osteoarthritis are noted in the left hip joint. IMPRESSION: 1. No signs of significant acute traumatic injury to the abdomen or pelvis to account for the patient's symptoms. 2. Advanced degenerative changes of osteoarthritis in the left hip joint. 3. Mild colonic diverticulosis without evidence of acute diverticulitis at this time. 4. Cholelithiasis without evidence of acute cholecystitis at this time. 5. Normal appendix. 6. Atherosclerosis. Electronically Signed   By: Vinnie Langton M.D.   On: 04/01/2015 11:55   I have personally reviewed and evaluated these images and lab results as part of my medical decision-making.   EKG Interpretation None      MDM   Final diagnoses:  Fall, initial encounter  Abdominal pain, unspecified abdominal location    Pt is okay to follow up with pcp as needed. No acute process noted. Discussed use of otc medications for pain    Glendell Docker, NP 04/01/15 1227  Leonard Schwartz, MD 04/02/15 [phone]

## 2015-04-01 NOTE — Discharge Instructions (Signed)

## 2015-04-01 NOTE — ED Notes (Signed)
Fell yesterday and hit head on door. Now having pain in pelvic areas similar to menstrual cramps, causing abdominal pain and nausea. States she "doesn't feel right, weak with no energy."

## 2015-04-01 NOTE — ED Notes (Signed)
PA at bedside.

## 2016-01-09 ENCOUNTER — Encounter (INDEPENDENT_AMBULATORY_CARE_PROVIDER_SITE_OTHER): Payer: Self-pay

## 2016-02-11 ENCOUNTER — Emergency Department (HOSPITAL_COMMUNITY): Payer: Self-pay

## 2016-02-11 ENCOUNTER — Encounter (HOSPITAL_COMMUNITY): Payer: Self-pay

## 2016-02-11 ENCOUNTER — Emergency Department (HOSPITAL_COMMUNITY)
Admission: EM | Admit: 2016-02-11 | Discharge: 2016-02-11 | Disposition: A | Discharge status: Home / Self Care | Payer: Self-pay | Attending: Emergency Medicine | Admitting: Emergency Medicine

## 2016-02-11 DIAGNOSIS — Z8541 Personal history of malignant neoplasm of cervix uteri: Secondary | ICD-10-CM | POA: Insufficient documentation

## 2016-02-11 DIAGNOSIS — M545 Low back pain: Secondary | ICD-10-CM

## 2016-02-11 DIAGNOSIS — E119 Type 2 diabetes mellitus without complications: Secondary | ICD-10-CM | POA: Insufficient documentation

## 2016-02-11 DIAGNOSIS — W19XXXA Unspecified fall, initial encounter: Secondary | ICD-10-CM | POA: Insufficient documentation

## 2016-02-11 DIAGNOSIS — Y999 Unspecified external cause status: Secondary | ICD-10-CM | POA: Insufficient documentation

## 2016-02-11 DIAGNOSIS — Z7984 Long term (current) use of oral hypoglycemic drugs: Secondary | ICD-10-CM | POA: Insufficient documentation

## 2016-02-11 DIAGNOSIS — Z7982 Long term (current) use of aspirin: Secondary | ICD-10-CM | POA: Insufficient documentation

## 2016-02-11 DIAGNOSIS — Z79899 Other long term (current) drug therapy: Secondary | ICD-10-CM | POA: Insufficient documentation

## 2016-02-11 DIAGNOSIS — Y929 Unspecified place or not applicable: Secondary | ICD-10-CM | POA: Insufficient documentation

## 2016-02-11 DIAGNOSIS — S32009A Unspecified fracture of unspecified lumbar vertebra, initial encounter for closed fracture: Secondary | ICD-10-CM

## 2016-02-11 DIAGNOSIS — S32018A Other fracture of first lumbar vertebra, initial encounter for closed fracture: Secondary | ICD-10-CM | POA: Insufficient documentation

## 2016-02-11 DIAGNOSIS — J45909 Unspecified asthma, uncomplicated: Secondary | ICD-10-CM | POA: Insufficient documentation

## 2016-02-11 DIAGNOSIS — I10 Essential (primary) hypertension: Secondary | ICD-10-CM | POA: Insufficient documentation

## 2016-02-11 DIAGNOSIS — F17210 Nicotine dependence, cigarettes, uncomplicated: Secondary | ICD-10-CM | POA: Insufficient documentation

## 2016-02-11 DIAGNOSIS — Y9301 Activity, walking, marching and hiking: Secondary | ICD-10-CM | POA: Insufficient documentation

## 2016-02-11 MED ORDER — OXYCODONE-ACETAMINOPHEN 5-325 MG PO TABS: 1.0000 | ORAL_TABLET | Freq: Four times a day (QID) | ORAL | 0 refills | Status: DC | PRN

## 2016-02-11 MED ORDER — CYCLOBENZAPRINE HCL 10 MG PO TABS: 10.0000 mg | ORAL_TABLET | Freq: Two times a day (BID) | ORAL | 0 refills | Status: DC | PRN

## 2016-02-11 MED ORDER — ONDANSETRON HCL 4 MG PO TABS: 4.0000 mg | ORAL_TABLET | Freq: Four times a day (QID) | ORAL | 0 refills | Status: DC

## 2016-02-11 MED ORDER — MORPHINE SULFATE (PF) 4 MG/ML IV SOLN
4.0000 mg | Freq: Once | INTRAVENOUS | Status: AC
  Administered 2016-02-11: 4 mg via INTRAVENOUS
  Filled 2016-02-11: qty 1

## 2016-02-11 NOTE — ED Notes (Signed)
Pt is agitated that she may not be able to get pain medications when she leaves. Pt has already had case manager come speak with her. Pt was told what pharmacies are open 24 hours and pt was told of resources such as goodrx.com Pt still was angry and continued to yell at this RN before and after pain medication was given

## 2016-02-11 NOTE — ED Notes (Signed)
[employer] ortho tech at bedside placing back brace

## 2016-02-11 NOTE — ED Notes (Signed)
Pt refused dc vitals because she was upset that she was kept here so long- her pharmacy closes at 1800. Pt was told she can get her prescriptions at any pharmacy. Pt was instructed to not drive for 3 hours due to pain medications

## 2016-02-11 NOTE — Progress Notes (Signed)
ED CM consulted to assist pt with medications at d/c if needed ED CM reviewed pt medication in EDP noted as toprol xl, prinizide and metformin Metformin and toprol xl at [place] for $4 each and prinzide cost is Less than $20 at [facility] per [facility] Pt sated she did not have issues affording her regular medications Cm discussed that [facility] does not have a program to assist with many pain medications or narcotics Pt confirms no pcp Not seeing Jeanann Lewandowsky Pt reports at end of year in 2016 she was changed form [employer] to [employer] without her consent because [employer] was not longer a participating insurer, therefore her premium went from $55 to $200/month Pt can not afford this premium She attempted to get appt and orange at [facility] but was informed in August 2017 that there were not appts and she would have to return in October 2017 to be seen Pt was not informed that there were other uninsured providers she could see that would assist her with and orange card  Pt noted to have 2 episodes of muscle spasms that cause her to pause to catch her breath   CM spoke with pt who confirms uninsured [employer] resident with no pcp.  CM discussed and provided written information to assist pt with determining choice for uninsured accepting pcps, discussed the importance of pcp vs EDP services for f/u care, www.needymeds.org, www.goodrx.com, discounted pharmacies and other [employer] such as [employer], affordable care act, financial assistance, uninsured dental services, [place] med assist, DSS and  health department  Reviewed resources for [employer] uninsured accepting pcps like [facility], family medicine at [address], [facility], [facility], local urgent care centers, [facility], [facility], general medical clinics, family services of the [place], [facility] urgent care plus others, medication  resources, [facility] out patient pharmacies and housing Pt voiced understanding and appreciation of resources provided   Provided [facility] contact information Pt agreed to a referral Cm completed referral Pt to be contact by [facility] clinical liaison Discussed with pt the importance of connecting with [facility]  Pt states she lives near and drives pass [facility] everyday Pt stating she does not want to be admitted because she has a young child she has to take care of at her home EDP came to speak with pt and Cm was discussing going to get him so that he could discuss her plan of care after he consulted with ortho about her disposition and pt got very anxious and request to sit on side of bed Pt with moderate moaning to do this independently  ED RN updated

## 2016-02-11 NOTE — ED Notes (Signed)
[employer] called for back brace. Lisa Ibarra from [facility] will be here to put brace on pt.

## 2016-02-11 NOTE — ED Provider Notes (Signed)
[facility] DEPT Provider Note   CSN: [id number] Arrival date & time: 02/11/16  0807     History   Chief Complaint Chief Complaint  Patient presents with  . Fall  . Back Pain    HPI Lisa Ibarra is a 55 y.o. female with a past medical history significant for asthma, hypertension, diabetes, and chronic back pain and chronic hip pain who presents with a fall. Patient reports that 2 days ago, patient was walking to her car when a spider web scared her causing to fall onto her back. Patient reports sudden onset severe back pain but denied numbness, tingling, or weakness. Patient denied loss of bowel or bladder function after fall. She described pain as 10 out of 10 severity, and slightly radiating down her left leg. Patient denies hitting her head, loss of consciousness, or other complaints on arrival. Patient had not taken any medications to help her symptoms.  The history is provided by the patient. No language interpreter was used.  Fall  This is a new problem. The current episode started 2 days ago. The problem occurs constantly. The problem has been gradually worsening. Pertinent negatives include no chest pain, no abdominal pain, no headaches and no shortness of breath. The symptoms are aggravated by walking and twisting. Nothing relieves the symptoms. She has tried nothing for the symptoms. The treatment provided no relief.    Past Medical History:  Diagnosis Date  . Asthma   . Cannabis abuse   . Diabetes mellitus type 2, controlled ([place])   . Hypertension     Patient Active Problem List   Diagnosis Date Noted  . Uncontrolled diabetes mellitus ([place]) 05/29/2012  . Influenza A H1N1 infection 05/28/2012  . Acute bronchitis 05/27/2012  . Asthma exacerbation 05/26/2012  . Diabetes mellitus ([place]) 05/26/2012  . Diarrhea 05/26/2012  . HYPERTENSION 01/05/2007  . HEADACHE 01/05/2007  . CERVICAL CANCER, HX OF 01/05/2007    Past Surgical History:  Procedure Laterality Date  .  CESAREAN SECTION    . Left eye    . TUBAL LIGATION    . TUMOR REMOVAL from back      OB History    No data available       Home Medications    Prior to Admission medications   Medication Sig Start Date End Date Taking? Authorizing Provider  albuterol (PROVENTIL HFA;VENTOLIN HFA) 108 (90 BASE) MCG/ACT inhaler Inhale 2 puffs into the lungs every 6 (six) hours as needed for wheezing or shortness of breath.    Historical Provider, MD  albuterol (PROVENTIL) (2.5 MG/3ML) 0.083% nebulizer solution Take 2.5 mg by nebulization every 4 (four) hours as needed for wheezing or shortness of breath.    Historical Provider, MD  Aspirin-Salicylamide-Caffeine (BC HEADACHE POWDER PO) Take 1 packet by mouth every 6 (six) hours as needed (headache).    Historical Provider, MD  HYDROcodone-acetaminophen (NORCO) 10-325 MG tablet     Historical Provider, MD  lisinopril-hydrochlorothiazide (PRINZIDE,ZESTORETIC) 20-12.5 MG per tablet Take 1 tablet by mouth daily.    Historical Provider, MD  metFORMIN (GLUCOPHAGE-XR) 750 MG 24 hr tablet Take 750 mg by mouth 2 (two) times daily.    Historical Provider, MD  metoprolol succinate (TOPROL-XL) 50 MG 24 hr tablet Take 1 tablet (50 mg total) by mouth daily. Take with or immediately following a meal. 05/29/12   Louellen Molder, MD    Family History Family History  Problem Relation Age of Onset  . Stroke Mother   . Lung cancer Father   .  Diabetes Mellitus II Sister     Social History Social History  Substance Use Topics  . Smoking status: Current Every Day Smoker    Types: Cigarettes  . Smokeless tobacco: Never Used  . Alcohol use No     Comment: last modearate drinking was 2 years ago     Allergies   Motrin [ibuprofen]   Review of Systems Review of Systems  Constitutional: Negative for chills, fatigue and fever.  HENT: Negative for congestion and rhinorrhea.   Eyes: Negative for visual disturbance.  Respiratory: Negative for chest tightness, shortness  of breath, wheezing and stridor.   Cardiovascular: Negative for chest pain and palpitations.  Gastrointestinal: Negative for abdominal pain, constipation, diarrhea, nausea and vomiting.  Genitourinary: Negative for difficulty urinating, dysuria, flank pain, frequency and hematuria.  Musculoskeletal: Positive for back pain. Negative for neck pain and neck stiffness.  Skin: Negative for rash and wound.  Neurological: Negative for dizziness, weakness, light-headedness, numbness and headaches.  Psychiatric/Behavioral: Negative for agitation and confusion.  All other systems reviewed and are negative.    Physical Exam Updated Vital Signs BP 108/77 (BP Location: Left Arm)   Pulse 88   Temp 98.6 F (37 C) (Oral)   Resp 18   LMP 06/12/2011   SpO2 96%   Physical Exam  Constitutional: She is oriented to person, place, and time. She appears well-developed and well-nourished. No distress.  HENT:  Head: Normocephalic and atraumatic.  Mouth/Throat: Oropharynx is clear and moist. No oropharyngeal exudate.  Eyes: Conjunctivae and EOM are normal. Pupils are equal, round, and reactive to light.  Neck: Normal range of motion.  Cardiovascular: Normal rate, regular rhythm and intact distal pulses.   No murmur heard. Pulmonary/Chest: Effort normal and breath sounds normal. No stridor. No respiratory distress. She has no wheezes. She exhibits no tenderness.  Abdominal: Soft. She exhibits no distension.  Musculoskeletal: She exhibits tenderness.       Right ankle: She exhibits normal range of motion and normal pulse.       Left ankle: She exhibits normal range of motion and normal pulse.       Lumbar back: She exhibits tenderness.       Back:  Bilateral LE are neurovascularly intact.   Neurological: She is alert and oriented to person, place, and time. She is not disoriented. She displays normal reflexes. No sensory deficit. She exhibits normal muscle tone.  Skin: Skin is warm. Capillary refill  takes less than 2 seconds. She is not diaphoretic.  Psychiatric: She has a normal mood and affect.  Nursing note and vitals reviewed.    ED Treatments / Results  Labs (all labs ordered are listed, but only abnormal results are displayed) Labs Reviewed - No data to display  EKG  EKG Interpretation None       Radiology Dg Pelvis 1-2 Views  Result Date: 02/11/2016 CLINICAL DATA:  Pain following fall 3 days prior EXAM: PELVIS - 1-2 VIEW COMPARISON:  None. FINDINGS: No fracture or dislocation. There is advanced osteoarthritic change in the left hip joint with a large subchondral cyst along the superior aspect of the left femoral head. There is moderate narrowing of the right hip joint. IMPRESSION: No acute fracture or dislocation. 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 A degree of underlying avascular necrosis in the left femoral head superiorly cannot be excluded. Milder narrowing right hip joint. MR would be the imaging study of choice to optimally assess for potential avascular necrosis. Electronically Signed  By: Lowella Grip III M.D.   On: 02/11/2016 11:16   Ct Thoracic Spine Wo Contrast  Result Date: 02/11/2016 CLINICAL DATA:  55 year old female status post fall 3 days ago with thoracolumbar back pain and spasms. Pain radiating to the abdomen. Initial encounter. EXAM: CT THORACIC AND LUMBAR SPINE WITHOUT CONTRAST TECHNIQUE: Multidetector CT imaging of the thoracic and lumbar spine was performed without contrast. Multiplanar CT image reconstructions were also generated. COMPARISON:  CT Abdomen and Pelvis 04/01/2015. Lumbar MRI 07/08/2011. Chest radiographs 02/13/2014 and earlier. FINDINGS: CT THORACIC SPINE FINDINGS Alignment: Minimal scoliotic curvature. Preserved thoracic kyphosis otherwise. Normal thoracic vertebral body height. Vertebrae: No acute osseous abnormality identified. Widespread thoracic degenerative endplate spurring from the T3 level to T10. Visible  posterior ribs appear intact. Paraspinal and other soft tissues: Negative visualized lung parenchyma aside from some paraseptal emphysema. No pericardial or pleural effusion. No mediastinal lymphadenopathy is evident. Calcified aortic atherosclerosis. Negative thoracic inlet. Negative visualized posterior paraspinal soft tissues. Disc levels: No significant thoracic spinal stenosis suggested by CT, despite widespread thoracic disc space loss and endplate spurring. There is advanced disc space loss and endplate degeneration also in the visible lower cervical spine. There is evidence of degenerative cervical spinal stenosis at C6-C7 which is at least mild (series 4, image 4). CT LUMBAR SPINE FINDINGS Segmentation: There are small/hypoplastic ribs at L1 with full size ribs at T12 when numbering according to the thoracic spine today. This is the same numbering system used on the 2013 MRI, designating a mostly lumbarized S1 level which is only partially visible today. Alignment: Stable exaggerated lumbar lordosis. Mild grade 1 anterolisthesis of L5 on S1 is stable. Vertebrae: Mildly comminuted superior endplate compression fracture of L1. Loss of vertebral body height up to 20%. No retropulsion of bone. No associated spinal or foraminal stenosis. L1 pedicles and posterior elements are intact. Other lumbar levels are intact. Visible sacrum and SI joints appear intact. Paraspinal and other soft tissues: Calcified aortic atherosclerosis. Stable visualized abdominal viscera. Negative visualized posterior paraspinal soft tissues. Disc levels: Paucity of lumbar disc degeneration, but multilevel moderate and severe lumbar facet arthropathy. Very severe facet arthropathy bilaterally at L4-L5 and L5-S1. Severe chronic facet hypertrophy and bilateral vacuum facet phenomena. Subsequent multifactorial moderate to severe spinal stenosis at L4-L5 which appears progressed since 2013 (Series 9, image 74). Borderline to mild spinal  narrowing at L5-S1 probably is not significantly changed. Multifactorial chronic 0 bilateral foraminal stenosis worse at L5-S1. IMPRESSION: CT THORACIC SPINE IMPRESSION 1.  No acute osseous abnormality in the thoracic spine. 2. Lower cervical chronic disc and endplate degeneration with at least mild degenerative spinal stenosis at C6-C7. 3.  Calcified aortic atherosclerosis. CT LUMBAR SPINE IMPRESSION 1. Transitional lumbosacral anatomy. Same numbering system used as on the 2013 MRI. 2. Acute mildly comminuted mild L1 compression fracture. Loss of height up to 20%. No retropulsed bone or complicating features. 3. No other acute osseous abnormality in the lumbar spine. 4. Chronic very severe lower lumbar facet arthropathy. Associated degenerative spinal stenosis at L4-L5 has progressed since 2013. Electronically Signed   By: Genevie Ann M.D.   On: 02/11/2016 12:05   Ct Lumbar Spine Wo Contrast  Result Date: 02/11/2016 CLINICAL DATA:  55 year old female status post fall 3 days ago with thoracolumbar back pain and spasms. Pain radiating to the abdomen. Initial encounter. EXAM: CT THORACIC AND LUMBAR SPINE WITHOUT CONTRAST TECHNIQUE: Multidetector CT imaging of the thoracic and lumbar spine was performed without contrast. Multiplanar CT image reconstructions were also  generated. COMPARISON:  CT Abdomen and Pelvis 04/01/2015. Lumbar MRI 07/08/2011. Chest radiographs 02/13/2014 and earlier. FINDINGS: CT THORACIC SPINE FINDINGS Alignment: Minimal scoliotic curvature. Preserved thoracic kyphosis otherwise. Normal thoracic vertebral body height. Vertebrae: No acute osseous abnormality identified. Widespread thoracic degenerative endplate spurring from the T3 level to T10. Visible posterior ribs appear intact. Paraspinal and other soft tissues: Negative visualized lung parenchyma aside from some paraseptal emphysema. No pericardial or pleural effusion. No mediastinal lymphadenopathy is evident. Calcified aortic  atherosclerosis. Negative thoracic inlet. Negative visualized posterior paraspinal soft tissues. Disc levels: No significant thoracic spinal stenosis suggested by CT, despite widespread thoracic disc space loss and endplate spurring. There is advanced disc space loss and endplate degeneration also in the visible lower cervical spine. There is evidence of degenerative cervical spinal stenosis at C6-C7 which is at least mild (series 4, image 4). CT LUMBAR SPINE FINDINGS Segmentation: There are small/hypoplastic ribs at L1 with full size ribs at T12 when numbering according to the thoracic spine today. This is the same numbering system used on the 2013 MRI, designating a mostly lumbarized S1 level which is only partially visible today. Alignment: Stable exaggerated lumbar lordosis. Mild grade 1 anterolisthesis of L5 on S1 is stable. Vertebrae: Mildly comminuted superior endplate compression fracture of L1. Loss of vertebral body height up to 20%. No retropulsion of bone. No associated spinal or foraminal stenosis. L1 pedicles and posterior elements are intact. Other lumbar levels are intact. Visible sacrum and SI joints appear intact. Paraspinal and other soft tissues: Calcified aortic atherosclerosis. Stable visualized abdominal viscera. Negative visualized posterior paraspinal soft tissues. Disc levels: Paucity of lumbar disc degeneration, but multilevel moderate and severe lumbar facet arthropathy. Very severe facet arthropathy bilaterally at L4-L5 and L5-S1. Severe chronic facet hypertrophy and bilateral vacuum facet phenomena. Subsequent multifactorial moderate to severe spinal stenosis at L4-L5 which appears progressed since 2013 (Series 9, image 74). Borderline to mild spinal narrowing at L5-S1 probably is not significantly changed. Multifactorial chronic 0 bilateral foraminal stenosis worse at L5-S1. IMPRESSION: CT THORACIC SPINE IMPRESSION 1.  No acute osseous abnormality in the thoracic spine. 2. Lower  cervical chronic disc and endplate degeneration with at least mild degenerative spinal stenosis at C6-C7. 3.  Calcified aortic atherosclerosis. CT LUMBAR SPINE IMPRESSION 1. Transitional lumbosacral anatomy. Same numbering system used as on the 2013 MRI. 2. Acute mildly comminuted mild L1 compression fracture. Loss of height up to 20%. No retropulsed bone or complicating features. 3. No other acute osseous abnormality in the lumbar spine. 4. Chronic very severe lower lumbar facet arthropathy. Associated degenerative spinal stenosis at L4-L5 has progressed since 2013. Electronically Signed   By: Genevie Ann M.D.   On: 02/11/2016 12:05    Procedures Procedures (including critical care time)  Medications Ordered in ED Medications  morphine 4 MG/ML injection 4 mg (4 mg Intravenous Given 02/11/16 1141)  morphine 4 MG/ML injection 4 mg (4 mg Intravenous Given 02/11/16 1539)     Initial Impression / Assessment and Plan / ED Course  I have reviewed the triage vital signs and the nursing notes.  Pertinent labs & imaging results that were available during my care of the patient were reviewed by me and considered in my medical decision making (see chart for details).  Clinical Course    Lisa Ibarra is a 55 y.o. female with a past medical history significant for asthma, hypertension, diabetes, and chronic back pain and chronic hip pain who presents with a fall. On exam, patient has  L-spine tenderness in the midline. Straight leg raise tests were negative. Patient was neurovascularly intact in her bilateral lower extremities. No abdominal tenderness.  Given patient's history of reported lumbar disc abnormalities, patient had CT ordered to evaluate her spine. Imaging results are above. Pelvis x-ray showed no acute fracture or dislocation. There was evidence of advanced osteoarthritic changes and some right hip joint narrowing. Avascular necrosis cannot be excluded. Spine CT scans showed an acute mildly  comminuted L1 compression fracture with height loss up to 20%. No bony retropulsion.   Patient given pain medicine which improved symptoms. Neurosurgery called for spine recommendations. Neurosurgery recommended TSO brace placement and follow-up.  Social work team spoke with patient for resources on low cost medications.  Brace placed by Multimedia programmer. Patient provided medications for pain, muscle spasm, and nausea. Patient given instructions to follow-up with the neurosurgery team within 4 weeks for further management. Patient given return precautions for any new or worsening symptoms including those for cauda equina. Patient understood return precautions and follow-up instructions. She had no depressions or concerns and patient discharged in good condition.    Final Clinical Impressions(s) / ED Diagnoses   Final diagnoses:  Closed fracture dislocation of lumbar spine, initial encounter ([place])  Midline low back pain, with sciatica presence unspecified    New Prescriptions Discharge Medication List as of 02/11/2016  3:59 PM    START taking these medications   Details  cyclobenzaprine (FLEXERIL) 10 MG tablet Take 1 tablet (10 mg total) by mouth 2 (two) times daily as needed for muscle spasms., Starting Thu 02/11/2016, Print    ondansetron (ZOFRAN) 4 MG tablet Take 1 tablet (4 mg total) by mouth every 6 (six) hours., Starting Thu 02/11/2016, Print    oxyCODONE-acetaminophen (PERCOCET/ROXICET) 5-325 MG tablet Take 1 tablet by mouth every 6 (six) hours as needed for severe pain., Starting Thu 02/11/2016, Print        Clinical Impression: 1. Closed fracture dislocation of lumbar spine, initial encounter ([place])   2. Midline low back pain, with sciatica presence unspecified     Disposition: Admit  Condition: Stable    Gwenyth Allegra [place], MD 02/11/16 1911

## 2016-02-11 NOTE — ED Triage Notes (Signed)
Pt c/o mid and lower back pain and spasms since a fall x 3 days ago.  Pain score 10/10.  Pt reports that she was startled by a spider and fell straight onto back.  Pt reports taking OTC pain medications w/o relief.  Sts "I already have back issues.  My back is dislocated in two places."  Sts spasms radiate into abdomen.

## 2016-02-11 NOTE — ED Notes (Signed)
Bed: WA19 Expected date:  Expected time:  Means of arrival:  Comments: 

## 2019-04-30 ENCOUNTER — Other Ambulatory Visit: Payer: Self-pay

## 2019-04-30 ENCOUNTER — Emergency Department (HOSPITAL_COMMUNITY): Payer: Self-pay

## 2019-04-30 ENCOUNTER — Emergency Department (HOSPITAL_COMMUNITY)
Admission: EM | Admit: 2019-04-30 | Discharge: 2019-04-30 | Disposition: A | Discharge status: Home / Self Care | Payer: Self-pay | Attending: Emergency Medicine | Admitting: Emergency Medicine

## 2019-04-30 ENCOUNTER — Encounter (HOSPITAL_COMMUNITY): Payer: Self-pay

## 2019-04-30 DIAGNOSIS — R42 Dizziness and giddiness: Secondary | ICD-10-CM | POA: Insufficient documentation

## 2019-04-30 DIAGNOSIS — F17210 Nicotine dependence, cigarettes, uncomplicated: Secondary | ICD-10-CM | POA: Insufficient documentation

## 2019-04-30 DIAGNOSIS — Z7984 Long term (current) use of oral hypoglycemic drugs: Secondary | ICD-10-CM | POA: Insufficient documentation

## 2019-04-30 DIAGNOSIS — Z79899 Other long term (current) drug therapy: Secondary | ICD-10-CM | POA: Insufficient documentation

## 2019-04-30 DIAGNOSIS — J45909 Unspecified asthma, uncomplicated: Secondary | ICD-10-CM | POA: Insufficient documentation

## 2019-04-30 DIAGNOSIS — I10 Essential (primary) hypertension: Secondary | ICD-10-CM | POA: Insufficient documentation

## 2019-04-30 DIAGNOSIS — E119 Type 2 diabetes mellitus without complications: Secondary | ICD-10-CM | POA: Insufficient documentation

## 2019-04-30 LAB — URINALYSIS, ROUTINE W REFLEX MICROSCOPIC
Bilirubin Urine: NEGATIVE
Glucose, UA: >=500 mg/dL — AB
Hgb urine dipstick: NEGATIVE
Ketones, ur: NEGATIVE mg/dL
Leukocytes,Ua: NEGATIVE
Nitrite: NEGATIVE
Protein, ur: NEGATIVE mg/dL
Specific Gravity, Urine: 1.023 (ref 1.005–1.030)
pH: 5 (ref 5.0–8.0)

## 2019-04-30 LAB — BASIC METABOLIC PANEL
Anion gap: 14 (ref 5–15)
BUN: 12 mg/dL (ref 6–20)
CO2: 22 mmol/L (ref 22–32)
Calcium: 9.4 mg/dL (ref 8.9–10.3)
Chloride: 103 mmol/L (ref 98–111)
Creatinine, Ser: 0.97 mg/dL (ref 0.44–1.00)
GFR calc Af Amer: >60 mL/min (ref >60)
GFR calc non Af Amer: >60 mL/min (ref >60)
Glucose, Bld: 174 mg/dL — ABNORMAL HIGH (ref 70–99)
Potassium: 3.7 mmol/L (ref 3.5–5.1)
Sodium: 139 mmol/L (ref 135–145)

## 2019-04-30 LAB — CBG MONITORING, ED: Glucose-Capillary: 172 mg/dL — ABNORMAL HIGH (ref 70–99)

## 2019-04-30 LAB — CBC
HCT: 44.7 % (ref 36.0–46.0)
Hemoglobin: 14.1 g/dL (ref 12.0–15.0)
MCH: 29 pg (ref 26.0–34.0)
MCHC: 31.5 g/dL (ref 30.0–36.0)
MCV: 92 fL (ref 80.0–100.0)
Platelets: 278 10*3/uL (ref 150–400)
RBC: 4.86 MIL/uL (ref 3.87–5.11)
RDW: 14.1 % (ref 11.5–15.5)
WBC: 6.8 10*3/uL (ref 4.0–10.5)
nRBC: 0 % (ref 0.0–0.2)

## 2019-04-30 MED ORDER — MECLIZINE HCL 25 MG PO TABS
25.0000 mg | ORAL_TABLET | Freq: Once | ORAL | Status: AC
  Administered 2019-04-30: 25 mg via ORAL
  Filled 2019-04-30: qty 1

## 2019-04-30 MED ORDER — LORAZEPAM 2 MG/ML IJ SOLN
1.0000 mg | Freq: Once | INTRAMUSCULAR | Status: AC
  Administered 2019-04-30: 1 mg via INTRAVENOUS
  Filled 2019-04-30: qty 1

## 2019-04-30 MED ORDER — DIAZEPAM 2 MG PO TABS: 2.0000 mg | ORAL_TABLET | Freq: Four times a day (QID) | ORAL | 0 refills | Status: DC | PRN

## 2019-04-30 MED ORDER — MECLIZINE HCL 12.5 MG PO TABS: 12.5000 mg | ORAL_TABLET | Freq: Three times a day (TID) | ORAL | 0 refills | Status: DC | PRN

## 2019-04-30 MED ORDER — SODIUM CHLORIDE 0.9% FLUSH
3.0000 mL | Freq: Once | INTRAVENOUS | Status: AC
  Administered 2019-04-30: 3 mL via INTRAVENOUS

## 2019-04-30 NOTE — ED Notes (Signed)
Discharge instructions reviewed with patient and patient verbalizes understanding.

## 2019-04-30 NOTE — ED Provider Notes (Signed)
[place] DEPT Provider Note   CSN: [id number] Arrival date & time: 04/30/19  0854     History   Chief Complaint Chief Complaint  Patient presents with  . Dizziness  . Nausea    HPI Lisa Ibarra is a 58 y.o. female.     58 year old female with history of vertigo presents with dizziness times a week and a half.  States that this feels like her usual vertigo and has been unresponsive to Dramamine.  Denies any headache.  No visual changes.  States that she has had some ataxia.  May be some upper extremity weakness but nothing profound.  Denies any trauma.  Symptoms are worse when she moves her head from side to side.  Denies any ear pain or recent URI symptoms.     Past Medical History:  Diagnosis Date  . Asthma   . Cannabis abuse   . Diabetes mellitus type 2, controlled ([place])   . Hypertension     Patient Active Problem List   Diagnosis Date Noted  . Uncontrolled diabetes mellitus ([place]) 05/29/2012  . Influenza A H1N1 infection 05/28/2012  . Acute bronchitis 05/27/2012  . Asthma exacerbation 05/26/2012  . Diabetes mellitus ([place]) 05/26/2012  . Diarrhea 05/26/2012  . HYPERTENSION 01/05/2007  . HEADACHE 01/05/2007  . CERVICAL CANCER, HX OF 01/05/2007    Past Surgical History:  Procedure Laterality Date  . CESAREAN SECTION    . Left eye    . TUBAL LIGATION    . TUMOR REMOVAL from back       OB History   No obstetric history on file.      Home Medications    Prior to Admission medications   Medication Sig Start Date End Date Taking? Authorizing Provider  albuterol (PROVENTIL HFA;VENTOLIN HFA) 108 (90 BASE) MCG/ACT inhaler Inhale 2 puffs into the lungs every 6 (six) hours as needed for wheezing or shortness of breath.    [provider]  Aspirin-Salicylamide-Caffeine (BC HEADACHE POWDER PO) Take 1 packet by mouth every 6 (six) hours as needed (headache).    [provider]  cyclobenzaprine (FLEXERIL) 10 MG  tablet Take 1 tablet (10 mg total) by mouth 2 (two) times daily as needed for muscle spasms. 02/11/16   Tegeler, Gwenyth Allegra, MD  lisinopril-hydrochlorothiazide (PRINZIDE,ZESTORETIC) 20-12.5 MG per tablet Take 1 tablet by mouth daily.    [provider]  metFORMIN (GLUCOPHAGE-XR) 750 MG 24 hr tablet Take 750 mg by mouth 2 (two) times daily.    [provider]  metoprolol succinate (TOPROL-XL) 50 MG 24 hr tablet Take 1 tablet (50 mg total) by mouth daily. Take with or immediately following a meal. 05/29/12   Dhungel, Nishant, MD  naphazoline-pheniramine (NAPHCON-A) 0.025-0.3 % ophthalmic solution 1 drop 4 (four) times daily as needed for irritation.    [provider]  ondansetron (ZOFRAN) 4 MG tablet Take 1 tablet (4 mg total) by mouth every 6 (six) hours. 02/11/16   Tegeler, Gwenyth Allegra, MD  oxyCODONE-acetaminophen (PERCOCET/ROXICET) 5-325 MG tablet Take 1 tablet by mouth every 6 (six) hours as needed for severe pain. 02/11/16   Tegeler, Gwenyth Allegra, MD    Family History Family History  Problem Relation Age of Onset  . Stroke Mother   . Lung cancer Father   . Diabetes Mellitus II Sister     Social History Social History   Tobacco Use  . Smoking status: Current Every Day Smoker    Types: Cigarettes  . Smokeless tobacco:  Never Used  Substance Use Topics  . Alcohol use: No    Comment: last modearate drinking was 2 years ago  . Drug use: No     Allergies   Motrin [ibuprofen]   Review of Systems Review of Systems  All other systems reviewed and are negative.    Physical Exam Updated Vital Signs BP 140/89   Pulse 83   Temp 98.1 F (36.7 C) (Oral)   Resp (!) 22   LMP 06/12/2011   SpO2 98%   Physical Exam Vitals signs and nursing note reviewed.  Constitutional:      General: She is not in acute distress.    Appearance: Normal appearance. She is well-developed. She is not toxic-appearing.  HENT:     Head: Normocephalic and atraumatic.   Eyes:     General: Lids are normal.     Conjunctiva/sclera: Conjunctivae normal.     Pupils: Pupils are equal, round, and reactive to light.  Neck:     Musculoskeletal: Normal range of motion and neck supple.     Thyroid: No thyroid mass.     Trachea: No tracheal deviation.  Cardiovascular:     Rate and Rhythm: Normal rate and regular rhythm.     Heart sounds: Normal heart sounds. No murmur. No gallop.   Pulmonary:     Effort: Pulmonary effort is normal. No respiratory distress.     Breath sounds: Normal breath sounds. No stridor. No decreased breath sounds, wheezing, rhonchi or rales.  Abdominal:     General: Bowel sounds are normal. There is no distension.     Palpations: Abdomen is soft.     Tenderness: There is no abdominal tenderness. There is no rebound.  Musculoskeletal: Normal range of motion.        General: No tenderness.  Skin:    General: Skin is warm and dry.     Findings: No abrasion or rash.  Neurological:     Mental Status: She is alert and oriented to person, place, and time.     GCS: GCS eye subscore is 4. GCS verbal subscore is 5. GCS motor subscore is 6.     Cranial Nerves: No cranial nerve deficit, dysarthria or facial asymmetry.     Sensory: No sensory deficit.     Coordination: Finger-Nose-Finger Test normal.     Comments: Strength 5 of 5 in all 4 extremities  Psychiatric:        Speech: Speech normal.        Behavior: Behavior normal.      ED Treatments / Results  Labs (all labs ordered are listed, but only abnormal results are displayed) Labs Reviewed  BASIC METABOLIC PANEL  CBC  URINALYSIS, ROUTINE W REFLEX MICROSCOPIC  CBG MONITORING, ED    EKG EKG Interpretation  Date/Time:  Tuesday April 30 2019 09:10:22 EST Ventricular Rate:  81 PR Interval:    QRS Duration: 97 QT Interval:  389 QTC Calculation: 452 R Axis:   10 Text Interpretation: Sinus rhythm Low voltage, precordial leads Abnormal R-wave progression, late transition  Borderline abnrm T, anterolateral leads Baseline wander in lead(s) II III aVF V5 V6 Confirmed by Lacretia Leigh ([id number]) on 04/30/2019 9:22:11 AM   Radiology No results found.  Procedures Procedures (including critical care time)  Medications Ordered in ED Medications  sodium chloride flush (NS) 0.9 % injection 3 mL (has no administration in time range)  LORazepam (ATIVAN) injection 1 mg (has no administration in time range)  meclizine (ANTIVERT) tablet 25  mg (has no administration in time range)     Initial Impression / Assessment and Plan / ED Course  I have reviewed the triage vital signs and the nursing notes.  Pertinent labs & imaging results that were available during my care of the patient were reviewed by me and considered in my medical decision making (see chart for details).        Patient states that her current symptoms are similar to her vertigo in the past.  Given Ativan and Antivert and feels much better.  She was able to ambulate with no assistance and no trouble with her gait.  Attempted to MRI but unsuccessful.  Shared decision-making performed with patient she states that once again the symptoms are similar to her prior vertigo.  She has no focal neurological deficits.  She is comfortable going home and return precautions given  Final Clinical Impressions(s) / ED Diagnoses   Final diagnoses:  None    ED Discharge Orders    None       Lacretia Leigh, MD 04/30/19 1157

## 2019-04-30 NOTE — ED Notes (Signed)
Spoke to MRI. MRI wants patient transported by this RN before 11am.

## 2019-04-30 NOTE — ED Notes (Signed)
Patient ambulated per request of MD. Patient states she is feeling well and denies dizziness or lightheadedness. VS stable at this time.

## 2019-04-30 NOTE — ED Triage Notes (Signed)
Patient arrived POV from work.   Patient reports dizziness and nausea X 1.5 weeks.   Patient reports nausea with her dizziness.   Patient states she feels as if she is spinning.   Patient has been taking dramamine   Patient seen at [facility] in February last year for same (dizziness)   Hx. Vertigo and COPD per patient   Patient states she had a "Monster" energy drink this morning.

## 2019-04-30 NOTE — Progress Notes (Signed)
Attempted pt for MRI at [facility], patient does not clear bore of scanner due to body habitus. Pt is also severely claustro and states she must be scanned under anesthesia. Spoke to [employer]   If MD needs MRI this needs to be set up at [facility]. [phone] bj/cap

## 2019-04-30 NOTE — ED Notes (Signed)
Patient ambulated to restroom with no assist and no problems noted with gate.

## 2019-04-30 NOTE — ED Notes (Signed)
Patient transported to MRI 

## 2019-08-31 ENCOUNTER — Encounter (HOSPITAL_BASED_OUTPATIENT_CLINIC_OR_DEPARTMENT_OTHER): Payer: Self-pay | Admitting: Emergency Medicine

## 2019-08-31 ENCOUNTER — Other Ambulatory Visit: Payer: Self-pay

## 2019-08-31 ENCOUNTER — Emergency Department (HOSPITAL_BASED_OUTPATIENT_CLINIC_OR_DEPARTMENT_OTHER): Payer: PRIVATE HEALTH INSURANCE

## 2019-08-31 ENCOUNTER — Emergency Department (HOSPITAL_BASED_OUTPATIENT_CLINIC_OR_DEPARTMENT_OTHER)
Admission: EM | Admit: 2019-08-31 | Discharge: 2019-08-31 | Disposition: A | Discharge status: Home / Self Care | Payer: PRIVATE HEALTH INSURANCE | Attending: Emergency Medicine | Admitting: Emergency Medicine

## 2019-08-31 DIAGNOSIS — Z79899 Other long term (current) drug therapy: Secondary | ICD-10-CM | POA: Diagnosis not present

## 2019-08-31 DIAGNOSIS — Z7984 Long term (current) use of oral hypoglycemic drugs: Secondary | ICD-10-CM | POA: Diagnosis not present

## 2019-08-31 DIAGNOSIS — F17210 Nicotine dependence, cigarettes, uncomplicated: Secondary | ICD-10-CM | POA: Diagnosis not present

## 2019-08-31 DIAGNOSIS — W01198A Fall on same level from slipping, tripping and stumbling with subsequent striking against other object, initial encounter: Secondary | ICD-10-CM | POA: Insufficient documentation

## 2019-08-31 DIAGNOSIS — S39012A Strain of muscle, fascia and tendon of lower back, initial encounter: Secondary | ICD-10-CM

## 2019-08-31 DIAGNOSIS — Y9389 Activity, other specified: Secondary | ICD-10-CM | POA: Insufficient documentation

## 2019-08-31 DIAGNOSIS — W19XXXA Unspecified fall, initial encounter: Secondary | ICD-10-CM

## 2019-08-31 DIAGNOSIS — Y990 Civilian activity done for income or pay: Secondary | ICD-10-CM | POA: Insufficient documentation

## 2019-08-31 DIAGNOSIS — I10 Essential (primary) hypertension: Secondary | ICD-10-CM | POA: Insufficient documentation

## 2019-08-31 DIAGNOSIS — J45909 Unspecified asthma, uncomplicated: Secondary | ICD-10-CM | POA: Insufficient documentation

## 2019-08-31 DIAGNOSIS — Y9289 Other specified places as the place of occurrence of the external cause: Secondary | ICD-10-CM | POA: Diagnosis not present

## 2019-08-31 DIAGNOSIS — M542 Cervicalgia: Secondary | ICD-10-CM | POA: Diagnosis not present

## 2019-08-31 DIAGNOSIS — R519 Headache, unspecified: Secondary | ICD-10-CM | POA: Insufficient documentation

## 2019-08-31 DIAGNOSIS — S29012A Strain of muscle and tendon of back wall of thorax, initial encounter: Secondary | ICD-10-CM | POA: Insufficient documentation

## 2019-08-31 DIAGNOSIS — E119 Type 2 diabetes mellitus without complications: Secondary | ICD-10-CM | POA: Insufficient documentation

## 2019-08-31 DIAGNOSIS — S0990XA Unspecified injury of head, initial encounter: Secondary | ICD-10-CM | POA: Diagnosis present

## 2019-08-31 MED ORDER — CYCLOBENZAPRINE HCL 10 MG PO TABS: 10.0000 mg | ORAL_TABLET | Freq: Two times a day (BID) | ORAL | 0 refills | Status: DC | PRN

## 2019-08-31 MED ORDER — TRAMADOL HCL 50 MG PO TABS
50.0000 mg | ORAL_TABLET | Freq: Once | ORAL | Status: AC
  Administered 2019-08-31: 17:00:00 50 mg via ORAL
  Filled 2019-08-31: qty 1

## 2019-08-31 MED ORDER — ACETAMINOPHEN 500 MG PO TABS: 1000.0000 mg | ORAL_TABLET | Freq: Four times a day (QID) | ORAL | 0 refills | Status: DC | PRN

## 2019-08-31 NOTE — ED Provider Notes (Signed)
[place] EMERGENCY DEPARTMENT Provider Note   CSN: [id number] Arrival date & time: 08/31/19  1521     History Chief Complaint  Patient presents with  . Fall    Lisa Ibarra is a 59 y.o. female.  59 year old female with past medical history below who presents with fall.  This morning when the patient was at work, she stepped on a wet mat and then slipped, falling onto her right side as she was trying to avoid injuring her left hip which was previously replaced.  She did not lose consciousness.  She does not specifically recall a head injury but is worried that she may have hit her head because she has had frontal head pain, neck pain, and diffuse back pain since the fall.  Pain is progressively worsened throughout the day.  She feels like she twisted awkwardly when she was trying to avoid injuring her left hip.  She denies any extremity numbness or weakness.  She reports nausea and dizziness, no vision changes or vomiting.  No anticoagulant use.  The history is provided by the patient.  Fall       Past Medical History:  Diagnosis Date  . Asthma   . Cannabis abuse   . Diabetes mellitus type 2, controlled ([place])   . Hypertension     Patient Active Problem List   Diagnosis Date Noted  . Uncontrolled diabetes mellitus ([place]) 05/29/2012  . Influenza A H1N1 infection 05/28/2012  . Acute bronchitis 05/27/2012  . Asthma exacerbation 05/26/2012  . Diabetes mellitus ([place]) 05/26/2012  . Diarrhea 05/26/2012  . HYPERTENSION 01/05/2007  . HEADACHE 01/05/2007  . CERVICAL CANCER, HX OF 01/05/2007    Past Surgical History:  Procedure Laterality Date  . CESAREAN SECTION    . Left eye    . TUBAL LIGATION    . TUMOR REMOVAL from back       OB History   No obstetric history on file.     Family History  Problem Relation Age of Onset  . Stroke Mother   . Lung cancer Father   . Diabetes Mellitus II Sister     Social History   Tobacco Use  . Smoking status:  Current Every Day Smoker    Types: Cigarettes  . Smokeless tobacco: Never Used  Substance Use Topics  . Alcohol use: No    Comment: last modearate drinking was 2 years ago  . Drug use: No    Home Medications Prior to Admission medications   Medication Sig Start Date End Date Taking? Authorizing Provider  acetaminophen (TYLENOL) 500 MG tablet Take 2 tablets (1,000 mg total) by mouth every 6 (six) hours as needed. 08/31/19   Little, Wenda Overland, MD  albuterol (PROVENTIL HFA;VENTOLIN HFA) 108 (90 BASE) MCG/ACT inhaler Inhale 2 puffs into the lungs every 6 (six) hours as needed for wheezing or shortness of breath.    [provider]  Aspirin-Salicylamide-Caffeine (BC HEADACHE POWDER PO) Take 1 packet by mouth every 6 (six) hours as needed (headache).    [provider]  cyclobenzaprine (FLEXERIL) 10 MG tablet Take 1 tablet (10 mg total) by mouth 2 (two) times daily as needed for muscle spasms. 08/31/19   Little, Wenda Overland, MD  diazepam (VALIUM) 2 MG tablet Take 1 tablet (2 mg total) by mouth every 6 (six) hours as needed (dizziness). 04/30/19   Lacretia Leigh, MD  lisinopril-hydrochlorothiazide (PRINZIDE,ZESTORETIC) 20-12.5 MG per tablet Take 1 tablet by mouth daily.    [provider]  meclizine (ANTIVERT) 12.5 MG tablet Take 1 tablet (12.5 mg total) by mouth 3 (three) times daily as needed for dizziness. 04/30/19   Lacretia Leigh, MD  metFORMIN (GLUCOPHAGE-XR) 750 MG 24 hr tablet Take 750 mg by mouth 2 (two) times daily.    [provider]  metoprolol succinate (TOPROL-XL) 50 MG 24 hr tablet Take 1 tablet (50 mg total) by mouth daily. Take with or immediately following a meal. 05/29/12   Dhungel, Nishant, MD  naphazoline-pheniramine (NAPHCON-A) 0.025-0.3 % ophthalmic solution 1 drop 4 (four) times daily as needed for irritation.    [provider]  ondansetron (ZOFRAN) 4 MG tablet Take 1 tablet (4 mg total) by mouth every 6 (six) hours. 02/11/16    Tegeler, Gwenyth Allegra, MD    Allergies    Motrin [ibuprofen]  Review of Systems   Review of Systems All other systems reviewed and are negative except that which was mentioned in HPI  Physical Exam Updated Vital Signs BP 122/71 (BP Location: Right Arm)   Pulse 80   Temp 98.1 F (36.7 C) (Oral)   Resp 20   Ht 5' 9.5" (1.765 m)   Wt 120.7 kg   LMP 06/12/2011   SpO2 98%   BMI 38.72 kg/m   Physical Exam Vitals and nursing note reviewed.  Constitutional:      General: She is not in acute distress.    Appearance: She is well-developed.  HENT:     Head: Normocephalic and atraumatic.  Eyes:     Extraocular Movements: Extraocular movements intact.     Conjunctiva/sclera: Conjunctivae normal.  Neck:     Comments: No focal neck tenderness Cardiovascular:     Rate and Rhythm: Normal rate and regular rhythm.     Heart sounds: Normal heart sounds. No murmur.  Pulmonary:     Effort: Pulmonary effort is normal.     Breath sounds: Normal breath sounds.  Abdominal:     General: Bowel sounds are normal. There is no distension.     Palpations: Abdomen is soft.     Tenderness: There is no abdominal tenderness.  Musculoskeletal:        General: No deformity or signs of injury.     Cervical back: Normal range of motion and neck supple.     Comments: No focal tenderness; indicates location of pain on L trapezius but not focally tender, no midline spinal tenderness  Skin:    General: Skin is warm and dry.  Neurological:     Mental Status: She is alert and oriented to person, place, and time.     Cranial Nerves: No cranial nerve deficit.     Sensory: No sensory deficit.     Motor: No weakness.     Comments: Fluent speech  Psychiatric:        Judgment: Judgment normal.     ED Results / Procedures / Treatments   Labs (all labs ordered are listed, but only abnormal results are displayed) Labs Reviewed - No data to display  EKG None  Radiology DG Thoracic Spine W/Swimmers   Result Date: 08/31/2019 CLINICAL DATA:  Head and mid back pain after slipping and falling at work today. EXAM: THORACIC SPINE - 3 VIEWS COMPARISON:  CT thoracolumbar spine 02/11/2016. FINDINGS: The lateral view is limited by motion artifact. There are 12 rib-bearing thoracic type vertebral bodies. The alignment is normal. A chronic superior endplate compression deformity at L1 appears unchanged. No evidence of acute thoracic spine fracture, paraspinal abnormality or widening  of the interpedicular distance. Stable degenerative changes throughout the mid thoracic spine and the lower cervical spine. IMPRESSION: No evidence of acute thoracic spine injury. Stable chronic superior endplate compression deformity at L1 and spondylosis. Electronically Signed   By: Richardean Sale M.D.   On: 08/31/2019 17:16   CT Head Wo Contrast  Result Date: 08/31/2019 CLINICAL DATA:  Fall, headache EXAM: CT HEAD WITHOUT CONTRAST TECHNIQUE: Contiguous axial images were obtained from the base of the skull through the vertex without intravenous contrast. COMPARISON:  07/01/2018 FINDINGS: Brain: No evidence of acute infarction, hemorrhage, hydrocephalus, extra-axial collection or mass lesion/mass effect. Vascular: Atherosclerotic calcifications involving the large vessels of the skull base. No unexpected hyperdense vessel. Skull: Normal. Negative for fracture or focal lesion. Sinuses/Orbits: No acute finding. Other: None. IMPRESSION: No acute intracranial findings. Electronically Signed   By: Davina Poke D.O.   On: 08/31/2019 17:35   CT Cervical Spine Wo Contrast  Result Date: 08/31/2019 CLINICAL DATA:  Fall, lower neck pain EXAM: CT CERVICAL SPINE WITHOUT CONTRAST TECHNIQUE: Multidetector CT imaging of the cervical spine was performed without intravenous contrast. Multiplanar CT image reconstructions were also generated. COMPARISON:  X-ray 12/16/2010 FINDINGS: Alignment: Facet joints are aligned without dislocation. Dens and  lateral masses aligned. Straightening of the cervical lordosis. No traumatic listhesis. Skull base and vertebrae: No acute fracture. No primary bone lesion or focal pathologic process. Soft tissues and spinal canal: No prevertebral fluid or swelling. No visible canal hematoma. Disc levels: Advanced multilevel cervical spondylosis with disc height loss and degenerative endplate spurring. 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 Probable calcified disc fragment at the C5-6 level. Mild multilevel facet and uncovertebral arthropathy. Multiple levels of cervical canal stenosis are evident at C3-4, C4-5, C5-6, and C6-7. Bilateral areas of foraminal stenosis, most pronounced at C5-6 and C6-7 on the left. Upper chest: Visualized lung apices clear. Other: Carotid atherosclerosis. IMPRESSION: 1. No acute fracture or subluxation of the cervical spine. 2. Advanced multilevel cervical spondylosis. Multiple levels of cervical canal stenosis are evident at C3-4 through C6-7. Bilateral areas of foraminal stenosis, most pronounced at C5-6 and C6-7 on the left. Electronically Signed   By: Davina Poke D.O.   On: 08/31/2019 17:33    Procedures Procedures (including critical care time)  Medications Ordered in ED Medications  traMADol (ULTRAM) tablet 50 mg (50 mg Oral Given 08/31/19 1632)    ED Course  I have reviewed the triage vital signs and the nursing notes.  Pertinent imaging results that were available during my care of the patient were reviewed by me and considered in my medical decision making (see chart for details).    MDM Rules/Calculators/A&P                      Neuro intact on exam.  CT of head and cervical spine negative acute, chronic cervical canal stenosis noted.  No acute findings of thoracic spine x-ray.  Discussed supportive measures and reviewed return precautions.  She voiced understanding. Final Clinical Impression(s) / ED Diagnoses Final diagnoses:  Fall,  initial encounter  Back strain, initial encounter    Rx / DC Orders ED Discharge Orders         Ordered    cyclobenzaprine (FLEXERIL) 10 MG tablet  2 times daily PRN     08/31/19 1811    acetaminophen (TYLENOL) 500 MG tablet  Every 6 hours PRN     08/31/19 1811  Little, Wenda Overland, MD 08/31/19 [phone]

## 2019-08-31 NOTE — ED Triage Notes (Signed)
States she slipped and fell on wet floor at work today. C/o head pain, back pain. Denies LOC

## 2020-06-01 ENCOUNTER — Other Ambulatory Visit: Payer: BC Managed Care – PPO

## 2020-12-01 ENCOUNTER — Encounter (HOSPITAL_BASED_OUTPATIENT_CLINIC_OR_DEPARTMENT_OTHER): Payer: Self-pay | Admitting: Emergency Medicine

## 2020-12-01 ENCOUNTER — Emergency Department (HOSPITAL_BASED_OUTPATIENT_CLINIC_OR_DEPARTMENT_OTHER)
Admission: EM | Admit: 2020-12-01 | Discharge: 2020-12-01 | Disposition: A | Discharge status: Home / Self Care | Payer: BC Managed Care – PPO | Attending: Emergency Medicine | Admitting: Emergency Medicine

## 2020-12-01 ENCOUNTER — Other Ambulatory Visit: Payer: Self-pay

## 2020-12-01 DIAGNOSIS — Z79899 Other long term (current) drug therapy: Secondary | ICD-10-CM | POA: Diagnosis not present

## 2020-12-01 DIAGNOSIS — J45909 Unspecified asthma, uncomplicated: Secondary | ICD-10-CM | POA: Insufficient documentation

## 2020-12-01 DIAGNOSIS — I10 Essential (primary) hypertension: Secondary | ICD-10-CM | POA: Diagnosis not present

## 2020-12-01 DIAGNOSIS — Z7982 Long term (current) use of aspirin: Secondary | ICD-10-CM | POA: Insufficient documentation

## 2020-12-01 DIAGNOSIS — G43809 Other migraine, not intractable, without status migrainosus: Secondary | ICD-10-CM | POA: Diagnosis not present

## 2020-12-01 DIAGNOSIS — Z7984 Long term (current) use of oral hypoglycemic drugs: Secondary | ICD-10-CM | POA: Diagnosis not present

## 2020-12-01 DIAGNOSIS — E119 Type 2 diabetes mellitus without complications: Secondary | ICD-10-CM | POA: Diagnosis not present

## 2020-12-01 DIAGNOSIS — F17210 Nicotine dependence, cigarettes, uncomplicated: Secondary | ICD-10-CM | POA: Insufficient documentation

## 2020-12-01 DIAGNOSIS — R519 Headache, unspecified: Secondary | ICD-10-CM | POA: Diagnosis present

## 2020-12-01 LAB — CBC WITH DIFFERENTIAL/PLATELET
Abs Immature Granulocytes: 0.02 10*3/uL (ref 0.00–0.07)
Basophils Absolute: 0 10*3/uL (ref 0.0–0.1)
Basophils Relative: 1 %
Eosinophils Absolute: 0.3 10*3/uL (ref 0.0–0.5)
Eosinophils Relative: 4 %
HCT: 49 % — ABNORMAL HIGH (ref 36.0–46.0)
Hemoglobin: 15.8 g/dL — ABNORMAL HIGH (ref 12.0–15.0)
Immature Granulocytes: 0 %
Lymphocytes Relative: 19 %
Lymphs Abs: 1.2 10*3/uL (ref 0.7–4.0)
MCH: 27.9 pg (ref 26.0–34.0)
MCHC: 32.2 g/dL (ref 30.0–36.0)
MCV: 86.6 fL (ref 80.0–100.0)
Monocytes Absolute: 0.6 10*3/uL (ref 0.1–1.0)
Monocytes Relative: 9 %
Neutro Abs: 4.3 10*3/uL (ref 1.7–7.7)
Neutrophils Relative %: 67 %
Platelets: 208 10*3/uL (ref 150–400)
RBC: 5.66 MIL/uL — ABNORMAL HIGH (ref 3.87–5.11)
RDW: 15.6 % — ABNORMAL HIGH (ref 11.5–15.5)
WBC: 6.4 10*3/uL (ref 4.0–10.5)
nRBC: 0 % (ref 0.0–0.2)

## 2020-12-01 LAB — COMPREHENSIVE METABOLIC PANEL
ALT: 13 U/L (ref 0–44)
AST: 16 U/L (ref 15–41)
Albumin: 3.5 g/dL (ref 3.5–5.0)
Alkaline Phosphatase: 90 U/L (ref 38–126)
Anion gap: 8 (ref 5–15)
BUN: 7 mg/dL (ref 6–20)
CO2: 28 mmol/L (ref 22–32)
Calcium: 8.9 mg/dL (ref 8.9–10.3)
Chloride: 101 mmol/L (ref 98–111)
Creatinine, Ser: 0.63 mg/dL (ref 0.44–1.00)
GFR, Estimated: >60 mL/min (ref >60)
Glucose, Bld: 194 mg/dL — ABNORMAL HIGH (ref 70–99)
Potassium: 3.6 mmol/L (ref 3.5–5.1)
Sodium: 137 mmol/L (ref 135–145)
Total Bilirubin: 0.5 mg/dL (ref 0.3–1.2)
Total Protein: 7.4 g/dL (ref 6.5–8.1)

## 2020-12-01 MED ORDER — METOCLOPRAMIDE HCL 5 MG/ML IJ SOLN
10.0000 mg | Freq: Once | INTRAMUSCULAR | Status: AC
  Administered 2020-12-01: 10 mg via INTRAVENOUS
  Filled 2020-12-01: qty 2

## 2020-12-01 MED ORDER — KETOROLAC TROMETHAMINE 30 MG/ML IJ SOLN
30.0000 mg | Freq: Once | INTRAMUSCULAR | Status: AC
  Administered 2020-12-01: 30 mg via INTRAVENOUS
  Filled 2020-12-01: qty 1

## 2020-12-01 NOTE — ED Provider Notes (Signed)
[facility] Provider Note  CSN: [id number] Arrival date & time: 12/01/20 9935    History Chief Complaint  Patient presents with   Migraine    Lisa Ibarra is a 60 y.o. female with history of HTN, DM, migraine headaches presents for evaluation of diffuse frontal headache ongoing for about a months since she has had increased stress at work. Associated with nausea, dizziness and photophobia. She denies any fevers or head injury. She has been taking OTC medications with minimal relief. She has not been to her PCP for same. She also reports several weeks of chest pain and 6 months of left arm pain. She reports baseline SOB and a mild cough recently.    Past Medical History:  Diagnosis Date   Asthma    Cannabis abuse    Diabetes mellitus type 2, controlled ([place])    Hypertension     Past Surgical History:  Procedure Laterality Date   CESAREAN SECTION     Left eye     TUBAL LIGATION     TUMOR REMOVAL from back      Family History  Problem Relation Age of Onset   Stroke Mother    Lung cancer Father    Diabetes Mellitus II Sister     Social History   Tobacco Use   Smoking status: Every Day    Pack years: 0.00    Types: Cigarettes   Smokeless tobacco: Never  Substance Use Topics   Alcohol use: No    Comment: last modearate drinking was 2 years ago   Drug use: No     Home Medications Prior to Admission medications   Medication Sig Start Date End Date Taking? Authorizing Provider  acetaminophen (TYLENOL) 500 MG tablet Take 2 tablets (1,000 mg total) by mouth every 6 (six) hours as needed. 08/31/19   Little, Wenda Overland, MD  albuterol (PROVENTIL HFA;VENTOLIN HFA) 108 (90 BASE) MCG/ACT inhaler Inhale 2 puffs into the lungs every 6 (six) hours as needed for wheezing or shortness of breath.    [provider]  Aspirin-Salicylamide-Caffeine (BC HEADACHE POWDER PO) Take 1 packet by mouth every 6 (six) hours as needed (headache).     [provider]  cyclobenzaprine (FLEXERIL) 10 MG tablet Take 1 tablet (10 mg total) by mouth 2 (two) times daily as needed for muscle spasms. 08/31/19   Little, Wenda Overland, MD  diazepam (VALIUM) 2 MG tablet Take 1 tablet (2 mg total) by mouth every 6 (six) hours as needed (dizziness). 04/30/19   Lacretia Leigh, MD  lisinopril-hydrochlorothiazide (PRINZIDE,ZESTORETIC) 20-12.5 MG per tablet Take 1 tablet by mouth daily.    [provider]  meclizine (ANTIVERT) 12.5 MG tablet Take 1 tablet (12.5 mg total) by mouth 3 (three) times daily as needed for dizziness. 04/30/19   Lacretia Leigh, MD  metFORMIN (GLUCOPHAGE-XR) 750 MG 24 hr tablet Take 750 mg by mouth 2 (two) times daily.    [provider]  metoprolol succinate (TOPROL-XL) 50 MG 24 hr tablet Take 1 tablet (50 mg total) by mouth daily. Take with or immediately following a meal. 05/29/12   Dhungel, Nishant, MD  naphazoline-pheniramine (NAPHCON-A) 0.025-0.3 % ophthalmic solution 1 drop 4 (four) times daily as needed for irritation.    [provider]  ondansetron (ZOFRAN) 4 MG tablet Take 1 tablet (4 mg total) by mouth every 6 (six) hours. 02/11/16   Tegeler, Gwenyth Allegra, MD     Allergies    Motrin [ibuprofen]  Review of Systems   Review of Systems A comprehensive review of systems was completed and negative except as noted in HPI.    Physical Exam BP (!) 151/94 (BP Location: Right Arm)   Pulse 80   Temp 98.1 F (36.7 C) (Oral)   Resp 18   Ht 5' 9.5" (1.765 m)   Wt 120.7 kg   LMP 06/12/2011   SpO2 95%   BMI 38.72 kg/m   Physical Exam Vitals and nursing note reviewed.  Constitutional:      Appearance: Normal appearance.  HENT:     Head: Normocephalic and atraumatic.     Nose: Nose normal.     Mouth/Throat:     Mouth: Mucous membranes are moist.  Eyes:     Extraocular Movements: Extraocular movements intact.     Conjunctiva/sclera: Conjunctivae normal.  Cardiovascular:     Rate and  Rhythm: Normal rate.  Pulmonary:     Effort: Pulmonary effort is normal.     Breath sounds: Normal breath sounds.  Abdominal:     General: Abdomen is flat.     Palpations: Abdomen is soft.     Tenderness: There is no abdominal tenderness.  Musculoskeletal:        General: No swelling. Normal range of motion.     Cervical back: Neck supple.  Skin:    General: Skin is warm and dry.  Neurological:     General: No focal deficit present.     Mental Status: She is alert.     Cranial Nerves: No cranial nerve deficit.     Sensory: No sensory deficit.     Motor: No weakness.     Gait: Gait normal.  Psychiatric:        Mood and Affect: Mood normal.     ED Results / Procedures / Treatments   Labs (all labs ordered are listed, but only abnormal results are displayed) Labs Reviewed  CBC WITH DIFFERENTIAL/PLATELET - Abnormal; Notable for the following components:      Result Value   RBC 5.66 (*)    Hemoglobin 15.8 (*)    HCT 49.0 (*)    RDW 15.6 (*)    All other components within normal limits  COMPREHENSIVE METABOLIC PANEL - Abnormal; Notable for the following components:   Glucose, Bld 194 (*)    All other components within normal limits    EKG EKG Interpretation  Date/Time:  Tuesday December 01 2020 07:40:39 EDT Ventricular Rate:  83 PR Interval:  167 QRS Duration: 91 QT Interval:  407 QTC Calculation: 479 R Axis:   -44 Text Interpretation: Sinus rhythm Left axis deviation Since last tracing T wave flattening has improved Confirmed by Calvert Cantor [phone]) on 12/01/2020 7:48:36 AM   Radiology No results found.  Procedures Procedures  Medications Ordered in the ED Medications  ketorolac (TORADOL) 30 MG/ML injection 30 mg (30 mg Intravenous Given 12/01/20 0743)  metoCLOPramide (REGLAN) injection 10 mg (10 mg Intravenous Given 12/01/20 0743)     MDM Rules/Calculators/A&P MDM Patient with multiple complaints, chief of which is her migraine. Will give Toradol and  Reglan for same. She also has had chest pain ongoing for several weeks, will check basic labs and EKG. .   ED Course  I have reviewed the triage vital signs and the nursing notes.  Pertinent labs & imaging results that were available during my care of the patient were reviewed by me and considered in my medical decision making (see chart for details).  Clinical  Course as of 12/01/20 0807  Tue Dec 01, 2020  0728 CBC and CMP are unremarkable. [CS]  0805 Patient reports headache has improved. Advised close PCP follow up for further management. Doubt acute intracranial process causing her headaches given the chronicity.  [CS]    Clinical Course User Index [CS] Truddie Hidden, MD    Final Clinical Impression(s) / ED Diagnoses Final diagnoses:  Other migraine without status migrainosus, not intractable    Rx / DC Orders ED Discharge Orders     None        Truddie Hidden, MD 12/01/20 [phone]

## 2020-12-01 NOTE — ED Triage Notes (Addendum)
Pt c/o migraine x 1 month with nausea and dizziness. Pt states she can take OTC meds and it will improve and then return once medication wears off.

## 2021-08-18 ENCOUNTER — Emergency Department (HOSPITAL_COMMUNITY): Payer: BC Managed Care – PPO

## 2021-08-18 ENCOUNTER — Other Ambulatory Visit: Payer: Self-pay

## 2021-08-18 ENCOUNTER — Inpatient Hospital Stay (HOSPITAL_COMMUNITY)
Admission: EM | Admit: 2021-08-18 | Discharge: 2021-08-22 | DRG: 177 | Disposition: A | Discharge status: Home / Self Care | Payer: BC Managed Care – PPO | Attending: Internal Medicine | Admitting: Internal Medicine

## 2021-08-18 ENCOUNTER — Encounter (HOSPITAL_COMMUNITY): Payer: Self-pay

## 2021-08-18 DIAGNOSIS — Z823 Family history of stroke: Secondary | ICD-10-CM

## 2021-08-18 DIAGNOSIS — U071 COVID-19: Secondary | ICD-10-CM | POA: Diagnosis present

## 2021-08-18 DIAGNOSIS — Z7984 Long term (current) use of oral hypoglycemic drugs: Secondary | ICD-10-CM

## 2021-08-18 DIAGNOSIS — F419 Anxiety disorder, unspecified: Secondary | ICD-10-CM | POA: Diagnosis present

## 2021-08-18 DIAGNOSIS — D6959 Other secondary thrombocytopenia: Secondary | ICD-10-CM | POA: Diagnosis present

## 2021-08-18 DIAGNOSIS — Z886 Allergy status to analgesic agent status: Secondary | ICD-10-CM | POA: Diagnosis not present

## 2021-08-18 DIAGNOSIS — Z6839 Body mass index (BMI) 39.0-39.9, adult: Secondary | ICD-10-CM | POA: Diagnosis not present

## 2021-08-18 DIAGNOSIS — I10 Essential (primary) hypertension: Secondary | ICD-10-CM | POA: Diagnosis present

## 2021-08-18 DIAGNOSIS — Z833 Family history of diabetes mellitus: Secondary | ICD-10-CM

## 2021-08-18 DIAGNOSIS — Z79899 Other long term (current) drug therapy: Secondary | ICD-10-CM

## 2021-08-18 DIAGNOSIS — F17210 Nicotine dependence, cigarettes, uncomplicated: Secondary | ICD-10-CM | POA: Diagnosis present

## 2021-08-18 DIAGNOSIS — E278 Other specified disorders of adrenal gland: Secondary | ICD-10-CM | POA: Diagnosis present

## 2021-08-18 DIAGNOSIS — R6510 Systemic inflammatory response syndrome (SIRS) of non-infectious origin without acute organ dysfunction: Secondary | ICD-10-CM

## 2021-08-18 DIAGNOSIS — D751 Secondary polycythemia: Secondary | ICD-10-CM | POA: Diagnosis present

## 2021-08-18 DIAGNOSIS — E669 Obesity, unspecified: Secondary | ICD-10-CM | POA: Diagnosis present

## 2021-08-18 DIAGNOSIS — E876 Hypokalemia: Secondary | ICD-10-CM

## 2021-08-18 DIAGNOSIS — J9601 Acute respiratory failure with hypoxia: Secondary | ICD-10-CM

## 2021-08-18 DIAGNOSIS — F1210 Cannabis abuse, uncomplicated: Secondary | ICD-10-CM | POA: Diagnosis present

## 2021-08-18 DIAGNOSIS — E1165 Type 2 diabetes mellitus with hyperglycemia: Secondary | ICD-10-CM | POA: Diagnosis not present

## 2021-08-18 DIAGNOSIS — R42 Dizziness and giddiness: Secondary | ICD-10-CM | POA: Diagnosis present

## 2021-08-18 DIAGNOSIS — E11649 Type 2 diabetes mellitus with hypoglycemia without coma: Secondary | ICD-10-CM | POA: Diagnosis present

## 2021-08-18 DIAGNOSIS — D696 Thrombocytopenia, unspecified: Secondary | ICD-10-CM

## 2021-08-18 DIAGNOSIS — J45901 Unspecified asthma with (acute) exacerbation: Secondary | ICD-10-CM | POA: Diagnosis present

## 2021-08-18 DIAGNOSIS — E119 Type 2 diabetes mellitus without complications: Secondary | ICD-10-CM

## 2021-08-18 DIAGNOSIS — Z801 Family history of malignant neoplasm of trachea, bronchus and lung: Secondary | ICD-10-CM | POA: Diagnosis not present

## 2021-08-18 LAB — COMPREHENSIVE METABOLIC PANEL
ALT: 12 U/L (ref 0–44)
AST: 17 U/L (ref 15–41)
Albumin: 3.1 g/dL — ABNORMAL LOW (ref 3.5–5.0)
Alkaline Phosphatase: 73 U/L (ref 38–126)
Anion gap: 8 (ref 5–15)
BUN: 8 mg/dL (ref 8–23)
CO2: 26 mmol/L (ref 22–32)
Calcium: 8.7 mg/dL — ABNORMAL LOW (ref 8.9–10.3)
Chloride: 105 mmol/L (ref 98–111)
Creatinine, Ser: 0.61 mg/dL (ref 0.44–1.00)
GFR, Estimated: >60 mL/min (ref >60)
Glucose, Bld: 65 mg/dL — ABNORMAL LOW (ref 70–99)
Potassium: 3.4 mmol/L — ABNORMAL LOW (ref 3.5–5.1)
Sodium: 139 mmol/L (ref 135–145)
Total Bilirubin: 0.4 mg/dL (ref 0.3–1.2)
Total Protein: 7.1 g/dL (ref 6.5–8.1)

## 2021-08-18 LAB — RESP PANEL BY RT-PCR (FLU A&B, COVID) ARPGX2
Influenza A by PCR: NEGATIVE
Influenza B by PCR: NEGATIVE
SARS Coronavirus 2 by RT PCR: POSITIVE — AB

## 2021-08-18 LAB — CBC WITH DIFFERENTIAL/PLATELET
Abs Immature Granulocytes: 0.02 10*3/uL (ref 0.00–0.07)
Basophils Absolute: 0 10*3/uL (ref 0.0–0.1)
Basophils Relative: 0 %
Eosinophils Absolute: 0.1 10*3/uL (ref 0.0–0.5)
Eosinophils Relative: 2 %
HCT: 52.6 % — ABNORMAL HIGH (ref 36.0–46.0)
Hemoglobin: 16.8 g/dL — ABNORMAL HIGH (ref 12.0–15.0)
Immature Granulocytes: 1 %
Lymphocytes Relative: 36 %
Lymphs Abs: 1.2 10*3/uL (ref 0.7–4.0)
MCH: 29 pg (ref 26.0–34.0)
MCHC: 31.9 g/dL (ref 30.0–36.0)
MCV: 90.8 fL (ref 80.0–100.0)
Monocytes Absolute: 0.4 10*3/uL (ref 0.1–1.0)
Monocytes Relative: 13 %
Neutro Abs: 1.6 10*3/uL — ABNORMAL LOW (ref 1.7–7.7)
Neutrophils Relative %: 48 %
Platelets: 135 10*3/uL — ABNORMAL LOW (ref 150–400)
RBC: 5.79 MIL/uL — ABNORMAL HIGH (ref 3.87–5.11)
RDW: 14.8 % (ref 11.5–15.5)
WBC: 3.3 10*3/uL — ABNORMAL LOW (ref 4.0–10.5)
nRBC: 0 % (ref 0.0–0.2)

## 2021-08-18 LAB — BRAIN NATRIURETIC PEPTIDE: B Natriuretic Peptide: 15.9 pg/mL (ref 0.0–100.0)

## 2021-08-18 LAB — LIPASE, BLOOD: Lipase: 32 U/L (ref 11–51)

## 2021-08-18 LAB — GLUCOSE, CAPILLARY
Glucose-Capillary: 216 mg/dL — ABNORMAL HIGH (ref 70–99)
Glucose-Capillary: 304 mg/dL — ABNORMAL HIGH (ref 70–99)

## 2021-08-18 LAB — HIV ANTIBODY (ROUTINE TESTING W REFLEX): HIV Screen 4th Generation wRfx: NONREACTIVE

## 2021-08-18 LAB — ETHANOL: Alcohol, Ethyl (B): <10 mg/dL (ref <10)

## 2021-08-18 LAB — HEMOGLOBIN A1C
Hgb A1c MFr Bld: 6.7 % — ABNORMAL HIGH (ref 4.8–5.6)
Mean Plasma Glucose: 145.59 mg/dL

## 2021-08-18 LAB — PROCALCITONIN: Procalcitonin: <0.1 ng/mL

## 2021-08-18 MED ORDER — FAMOTIDINE IN NACL 20-0.9 MG/50ML-% IV SOLN
20.0000 mg | Freq: Once | INTRAVENOUS | Status: AC
  Administered 2021-08-18: 20 mg via INTRAVENOUS
  Filled 2021-08-18: qty 50

## 2021-08-18 MED ORDER — SUCRALFATE 1 GM/10ML PO SUSP
1.0000 g | Freq: Once | ORAL | Status: AC
  Administered 2021-08-18: 1 g via ORAL
  Filled 2021-08-18: qty 10

## 2021-08-18 MED ORDER — ASCORBIC ACID 500 MG PO TABS
500.0000 mg | ORAL_TABLET | Freq: Every day | ORAL | Status: DC
  Administered 2021-08-18 – 2021-08-22 (×5): 500 mg via ORAL
  Filled 2021-08-18 (×4): qty 1

## 2021-08-18 MED ORDER — ACETAMINOPHEN 325 MG PO TABS: 650.0000 mg | ORAL_TABLET | Freq: Four times a day (QID) | ORAL | Status: DC | PRN

## 2021-08-18 MED ORDER — IOHEXOL 350 MG/ML SOLN
80.0000 mL | Freq: Once | INTRAVENOUS | Status: AC | PRN
  Administered 2021-08-18: 80 mL via INTRAVENOUS

## 2021-08-18 MED ORDER — HYDROCOD POLI-CHLORPHE POLI ER 10-8 MG/5ML PO SUER
5.0000 mL | Freq: Two times a day (BID) | ORAL | Status: DC | PRN
  Administered 2021-08-19 – 2021-08-22 (×5): 5 mL via ORAL
  Filled 2021-08-18 (×5): qty 5

## 2021-08-18 MED ORDER — PREDNISONE 50 MG PO TABS: 50.0000 mg | ORAL_TABLET | Freq: Every day | ORAL | Status: DC

## 2021-08-18 MED ORDER — ALBUTEROL SULFATE HFA 108 (90 BASE) MCG/ACT IN AERS
2.0000 | INHALATION_SPRAY | RESPIRATORY_TRACT | Status: DC | PRN
Start: 2021-08-18 — End: 2021-08-18
  Filled 2021-08-18: qty 6.7

## 2021-08-18 MED ORDER — DEXAMETHASONE SODIUM PHOSPHATE 10 MG/ML IJ SOLN
10.0000 mg | Freq: Once | INTRAMUSCULAR | Status: AC
  Administered 2021-08-18: 10 mg via INTRAVENOUS
  Filled 2021-08-18: qty 1

## 2021-08-18 MED ORDER — PANTOPRAZOLE SODIUM 40 MG PO TBEC
40.0000 mg | DELAYED_RELEASE_TABLET | Freq: Every day | ORAL | Status: DC
  Administered 2021-08-18 – 2021-08-22 (×5): 40 mg via ORAL
  Filled 2021-08-18 (×5): qty 1

## 2021-08-18 MED ORDER — ONDANSETRON HCL 4 MG/2ML IJ SOLN
4.0000 mg | Freq: Once | INTRAMUSCULAR | Status: AC
  Administered 2021-08-18: 4 mg via INTRAVENOUS
  Filled 2021-08-18: qty 2

## 2021-08-18 MED ORDER — ALBUTEROL SULFATE (2.5 MG/3ML) 0.083% IN NEBU
5.0000 mg | INHALATION_SOLUTION | Freq: Once | RESPIRATORY_TRACT | Status: AC
  Administered 2021-08-18: 5 mg via RESPIRATORY_TRACT
  Filled 2021-08-18: qty 6

## 2021-08-18 MED ORDER — GUAIFENESIN-DM 100-10 MG/5ML PO SYRP
10.0000 mL | ORAL_SOLUTION | ORAL | Status: DC | PRN
  Filled 2021-08-18: qty 10

## 2021-08-18 MED ORDER — POTASSIUM CHLORIDE CRYS ER 20 MEQ PO TBCR
40.0000 meq | EXTENDED_RELEASE_TABLET | Freq: Every day | ORAL | Status: DC
  Administered 2021-08-18: 40 meq via ORAL
  Filled 2021-08-18: qty 2

## 2021-08-18 MED ORDER — METHYLPREDNISOLONE SODIUM SUCC 125 MG IJ SOLR
120.0000 mg | INTRAMUSCULAR | Status: DC
  Administered 2021-08-18: 120 mg via INTRAVENOUS
  Filled 2021-08-18: qty 2

## 2021-08-18 MED ORDER — ZINC SULFATE 220 (50 ZN) MG PO CAPS
220.0000 mg | ORAL_CAPSULE | Freq: Every day | ORAL | Status: DC
  Administered 2021-08-18 – 2021-08-22 (×5): 220 mg via ORAL
  Filled 2021-08-18 (×5): qty 1

## 2021-08-18 MED ORDER — IPRATROPIUM-ALBUTEROL 20-100 MCG/ACT IN AERS
1.0000 | INHALATION_SPRAY | Freq: Four times a day (QID) | RESPIRATORY_TRACT | Status: DC
  Administered 2021-08-18 – 2021-08-22 (×16): 1 via RESPIRATORY_TRACT
  Filled 2021-08-18: qty 4

## 2021-08-18 MED ORDER — NIRMATRELVIR/RITONAVIR (PAXLOVID)TABLET
3.0000 | ORAL_TABLET | Freq: Two times a day (BID) | ORAL | Status: DC
  Administered 2021-08-18 – 2021-08-22 (×9): 3 via ORAL
  Filled 2021-08-18: qty 30

## 2021-08-18 MED ORDER — INSULIN ASPART 100 UNIT/ML IJ SOLN
0.0000 [IU] | Freq: Three times a day (TID) | INTRAMUSCULAR | Status: DC
  Administered 2021-08-18: 5 [IU] via SUBCUTANEOUS
  Administered 2021-08-19: 8 [IU] via SUBCUTANEOUS
  Administered 2021-08-19: 11 [IU] via SUBCUTANEOUS
  Administered 2021-08-19: 8 [IU] via SUBCUTANEOUS
  Administered 2021-08-20: 15 [IU] via SUBCUTANEOUS
  Administered 2021-08-20 (×2): 11 [IU] via SUBCUTANEOUS
  Administered 2021-08-21: 15 [IU] via SUBCUTANEOUS
  Administered 2021-08-21: 11 [IU] via SUBCUTANEOUS
  Administered 2021-08-21 – 2021-08-22 (×3): 8 [IU] via SUBCUTANEOUS

## 2021-08-18 NOTE — Plan of Care (Signed)
  Problem: Clinical Measurements: Goal: Will remain free from infection Outcome: Progressing Goal: Diagnostic test results will improve Outcome: Progressing Goal: Respiratory complications will improve Outcome: Progressing Goal: Cardiovascular complication will be avoided Outcome: Progressing   Problem: Coping: Goal: Level of anxiety will decrease Outcome: Progressing   

## 2021-08-18 NOTE — H&P (Signed)
?History and Physical  ? ? ?Patient: Lisa Ibarra XHB:[id number] DOB: February 09, 1961 ?DOA: 08/18/2021 ?DOS: the patient was seen and examined on 08/18/2021 ?PCP: Rudene Anda, MD  ?Patient coming from: Home ? ?Chief Complaint:  ?Chief Complaint  ?Patient presents with  ? Dizziness  ? Shortness of Breath  ? ?HPI: Lisa Ibarra is a 61 y.o. female with medical history significant of HTN, asthma, tobacco abuse, DM2, anxiety. Presenting with dyspnea. Her symptoms began roughly 5 days ago. She had cough and congestion that she couldn't clear. She tried her albuterol, but it didn't help. Her symptoms progressed to include shortness of breath. She didn't have any fever. She believes she had a sick contact at church. When her symptoms didn't improve this morning, she decided to come to the ED for help. She denies any other aggravating or alleviating factors.  ?  ?Review of Systems: As mentioned in the history of present illness. All other systems reviewed and are negative. ?Past Medical History:  ?Diagnosis Date  ? Asthma   ? Cannabis abuse   ? Diabetes mellitus type 2, controlled ([place])   ? Hypertension   ? ?Past Surgical History:  ?Procedure Laterality Date  ? CESAREAN SECTION    ? Left eye    ? TUBAL LIGATION    ? TUMOR REMOVAL from back    ? ?Social History:  reports that she has been smoking cigarettes. She has been smoking an average of .25 packs per day. She has never used smokeless tobacco. She reports current drug use. Drug: Marijuana. She reports that she does not drink alcohol. ? ?Allergies  ?Allergen Reactions  ? Motrin [Ibuprofen] Nausea And Vomiting  ? ? ?Family History  ?Problem Relation Age of Onset  ? Stroke Mother   ? Lung cancer Father   ? Diabetes Mellitus II Sister   ? ? ?Prior to Admission medications   ?Medication Sig Start Date End Date Taking? Authorizing Provider  ?acetaminophen (TYLENOL) 500 MG tablet Take 2 tablets (1,000 mg total) by mouth every 6 (six) hours as needed. 08/31/19   Little, Wenda Overland, MD   ?albuterol (PROVENTIL HFA;VENTOLIN HFA) 108 (90 BASE) MCG/ACT inhaler Inhale 2 puffs into the lungs every 6 (six) hours as needed for wheezing or shortness of breath.    [provider]  ?Aspirin-Salicylamide-Caffeine (BC HEADACHE POWDER PO) Take 1 packet by mouth every 6 (six) hours as needed (headache).    [provider]  ?cyclobenzaprine (FLEXERIL) 10 MG tablet Take 1 tablet (10 mg total) by mouth 2 (two) times daily as needed for muscle spasms. 08/31/19   Little, Wenda Overland, MD  ?diazepam (VALIUM) 2 MG tablet Take 1 tablet (2 mg total) by mouth every 6 (six) hours as needed (dizziness). 04/30/19   Lacretia Leigh, MD  ?lisinopril-hydrochlorothiazide (PRINZIDE,ZESTORETIC) 20-12.5 MG per tablet Take 1 tablet by mouth daily.    [provider]  ?meclizine (ANTIVERT) 12.5 MG tablet Take 1 tablet (12.5 mg total) by mouth 3 (three) times daily as needed for dizziness. 04/30/19   Lacretia Leigh, MD  ?metFORMIN (GLUCOPHAGE-XR) 750 MG 24 hr tablet Take 750 mg by mouth 2 (two) times daily.    [provider]  ?metoprolol succinate (TOPROL-XL) 50 MG 24 hr tablet Take 1 tablet (50 mg total) by mouth daily. Take with or immediately following a meal. 05/29/12   Dhungel, Nishant, MD  ?naphazoline-pheniramine (NAPHCON-A) 0.025-0.3 % ophthalmic solution 1 drop 4 (four) times daily as needed for irritation.    [provider]  ?  ondansetron (ZOFRAN) 4 MG tablet Take 1 tablet (4 mg total) by mouth every 6 (six) hours. 02/11/16   Tegeler, Gwenyth Allegra, MD  ? ? ?Physical Exam: ?Vitals:  ? 08/18/21 1045 08/18/21 1100 08/18/21 1139 08/18/21 1220  ?BP: (!) 145/90 (!) 162/79  (!) 150/96  ?Pulse: 80 80 81 85  ?Resp: '18  18 18  '$ ?Temp:      ?TempSrc:      ?SpO2: 90% (!) 88% 97% 94%  ?Height:      ? ?General: 61 y.o. female resting in bed in NAD ?Eyes: PERRL, normal sclera ?ENMT: Nares patent w/o discharge, orophaynx clear, dentition normal, ears w/o discharge/lesions/ulcers ?Neck: Supple, trachea  midline ?Cardiovascular: RRR, +S1, S2, no m/g/r, equal pulses throughout ?Respiratory: decreased at bases, scattered wheeze/mild, no r/r, increased WOB on 7L [place] ?GI: BS+, NDNT, no masses noted, no organomegaly noted ?MSK: No e/c/c ?Neuro: A&O x 3, no focal deficits ?Psyc: Appropriate interaction and affect, calm/cooperative ? ?Data Reviewed: ? ?K+  3.4 ?Glucose  65 ?WBC 3.3 ?Hgb 16.8 ?Plt 135 ? ?CTA chest PE: 1. No evidence of pulmonary embolus. 2. Bilateral bronchial wall thickening, findings can be seen in the setting of bronchitis. 3. Diffuse mosaic attenuation, likely due to air trapping, which can be seen in the setting of small airways disease. 4. Aortic Atherosclerosis (ICD10-I70.0). ? ?Assessment and Plan: ?No notes have been filed under this hospital service. ?Service: Hospitalist ?COVID 19 infection ?Asthma exacerbation ?Acute hypoxic respiratory failure ?SIRS ?    - admit to inpt, tele ?    - continue paxlovid, add solumedrol, combivent, guaifenesin ?    - follow inflammatory markers ?    - CTA PE as above ?    - incentive spiro, FV ?    - check procal ? ?DM2 ?Hypoglycemia ?    - A1c, SSI, glucose checks DM diet ?    - hypoglycemia is mild; expect to have hyperglycemia after steroid admin ? ?Hypokalemia ?    - replace K+, check Mg2+ ? ?HTN ?    - resume home regimen when confirmed ? ?Anxiety ?    - resume home regimen when confirmed ? ?Thrombocytopenia ?    - no evidence of bleed, trend for now; SCD for DVT PPx ? ?Advance Care Planning:   Code Status: FULL ? ?Consults: None ? ?Family Communication: w/ daughter at bedside ? ?Severity of Illness: ?The appropriate patient status for this patient is INPATIENT. Inpatient status is judged to be reasonable and necessary in order to provide the required intensity of service to ensure the patient's safety. The patient's presenting symptoms, physical exam findings, and initial radiographic and laboratory data in the context of their chronic comorbidities is felt to  place them at high risk for further clinical deterioration. Furthermore, it is not anticipated that the patient will be medically stable for discharge from the hospital within 2 midnights of admission.  ? ?* I certify that at the point of admission it is my clinical judgment that the patient will require inpatient hospital care spanning beyond 2 midnights from the point of admission due to high intensity of service, high risk for further deterioration and high frequency of surveillance required.* ? ?Author: ?Jonnie Finner, DO ?08/18/2021 1:13 PM ? ?For on call review [email or web address].  ?

## 2021-08-18 NOTE — ED Triage Notes (Signed)
Pt c/o SOB, cough, and vertigo since last Friday. Pt also states that they feel nauseas and like their chest is congested. Pt states that today has been the worst of it so far. Pt states that their abdomen hurts and they feel like they are about to have diarrhea.  ?

## 2021-08-18 NOTE — Plan of Care (Signed)
?  Problem: Education: ?Goal: Knowledge of General Education information will improve ?Description: Including pain rating scale, medication(s)/side effects and non-pharmacologic comfort measures ?08/18/2021 2244 by Luan Moore, RN ?Outcome: Progressing ?08/18/2021 2244 by Luan Moore, RN ?Outcome: Progressing ?  ?Problem: Health Behavior/Discharge Planning: ?Goal: Ability to manage health-related needs will improve ?08/18/2021 2244 by Luan Moore, RN ?Outcome: Progressing ?08/18/2021 2244 by Luan Moore, RN ?Outcome: Progressing ?  ?Problem: Clinical Measurements: ?Goal: Ability to maintain clinical measurements within normal limits will improve ?08/18/2021 2244 by Luan Moore, RN ?Outcome: Progressing ?08/18/2021 2244 by Luan Moore, RN ?Outcome: Progressing ?Goal: Will remain free from infection ?08/18/2021 2244 by Luan Moore, RN ?Outcome: Progressing ?08/18/2021 2244 by Luan Moore, RN ?Outcome: Progressing ?Goal: Diagnostic test results will improve ?08/18/2021 2244 by Luan Moore, RN ?Outcome: Progressing ?08/18/2021 2244 by Luan Moore, RN ?Outcome: Progressing ?Goal: Respiratory complications will improve ?08/18/2021 2244 by Luan Moore, RN ?Outcome: Progressing ?08/18/2021 2244 by Luan Moore, RN ?Outcome: Progressing ?Goal: Cardiovascular complication will be avoided ?08/18/2021 2244 by Luan Moore, RN ?Outcome: Progressing ?08/18/2021 2244 by Luan Moore, RN ?Outcome: Progressing ?  ?Problem: Activity: ?Goal: Risk for activity intolerance will decrease ?08/18/2021 2244 by Luan Moore, RN ?Outcome: Progressing ?08/18/2021 2244 by Luan Moore, RN ?Outcome: Progressing ?  ?Problem: Nutrition: ?Goal: Adequate nutrition will be maintained ?08/18/2021 2244 by Luan Moore, RN ?Outcome: Progressing ?08/18/2021 2244 by Luan Moore, RN ?Outcome: Progressing ?  ?Problem: Coping: ?Goal: Level of anxiety will  decrease ?08/18/2021 2244 by Luan Moore, RN ?Outcome: Progressing ?08/18/2021 2244 by Luan Moore, RN ?Outcome: Progressing ?  ?Problem: Elimination: ?Goal: Will not experience complications related to bowel motility ?08/18/2021 2244 by Luan Moore, RN ?Outcome: Progressing ?08/18/2021 2244 by Luan Moore, RN ?Outcome: Progressing ?Goal: Will not experience complications related to urinary retention ?08/18/2021 2244 by Luan Moore, RN ?Outcome: Progressing ?08/18/2021 2244 by Luan Moore, RN ?Outcome: Progressing ?  ?Problem: Pain Managment: ?Goal: General experience of comfort will improve ?08/18/2021 2244 by Luan Moore, RN ?Outcome: Progressing ?08/18/2021 2244 by Luan Moore, RN ?Outcome: Progressing ?  ?Problem: Safety: ?Goal: Ability to remain free from injury will improve ?08/18/2021 2244 by Luan Moore, RN ?Outcome: Progressing ?08/18/2021 2244 by Luan Moore, RN ?Outcome: Progressing ?  ?Problem: Skin Integrity: ?Goal: Risk for impaired skin integrity will decrease ?08/18/2021 2244 by Luan Moore, RN ?Outcome: Progressing ?08/18/2021 2244 by Luan Moore, RN ?Outcome: Progressing ?  ?Problem: Education: ?Goal: Knowledge of risk factors and measures for prevention of condition will improve ?Outcome: Progressing ?  ?Problem: Coping: ?Goal: Psychosocial and spiritual needs will be supported ?Outcome: Progressing ?  ?Problem: Respiratory: ?Goal: Will maintain a patent airway ?Outcome: Progressing ?Goal: Complications related to the disease process, condition or treatment will be avoided or minimized ?Outcome: Progressing ?  ?

## 2021-08-18 NOTE — ED Provider Notes (Signed)
?[facility] DEPT ?Provider Note ? ? ?CSN: [id number] ?Arrival date & time: 08/18/21  0754 ? ?  ? ?History ? ?Chief Complaint  ?Patient presents with  ? Dizziness  ? Shortness of Breath  ? ? ?Lisa Ibarra is a 61 y.o. female. ? ?HPIKaren Ibarra is a 61 year-old-female with a history of asthma, HTN, T2DM, and approximate 30 pack year smoking history who presents to the ED for complaints of shortness of breath, non-productive cough, and vertigo since Friday. Symptoms have continued to worsen and feels like when she previously had an URI. OTC cold medication and her Albuterol inhaler do not help. Endorses fatigue, chest pain with cough, congestion, DOE, lower extremity edema, and orthopnea. Patient also endorses sporadic crampy epigastric pain with associated diarrhea. Denies any recent illness or sick contacts, no recent surgery or trauma. ? ?  ? ?Home Medications ?Prior to Admission medications   ?Medication Sig Start Date End Date Taking? Authorizing Provider  ?acetaminophen (TYLENOL) 500 MG tablet Take 2 tablets (1,000 mg total) by mouth every 6 (six) hours as needed. 08/31/19   Little, Wenda Overland, MD  ?albuterol (PROVENTIL HFA;VENTOLIN HFA) 108 (90 BASE) MCG/ACT inhaler Inhale 2 puffs into the lungs every 6 (six) hours as needed for wheezing or shortness of breath.    [provider]  ?Aspirin-Salicylamide-Caffeine (BC HEADACHE POWDER PO) Take 1 packet by mouth every 6 (six) hours as needed (headache).    [provider]  ?cyclobenzaprine (FLEXERIL) 10 MG tablet Take 1 tablet (10 mg total) by mouth 2 (two) times daily as needed for muscle spasms. 08/31/19   Little, Wenda Overland, MD  ?diazepam (VALIUM) 2 MG tablet Take 1 tablet (2 mg total) by mouth every 6 (six) hours as needed (dizziness). 04/30/19   Lacretia Leigh, MD  ?lisinopril-hydrochlorothiazide (PRINZIDE,ZESTORETIC) 20-12.5 MG per tablet Take 1 tablet by mouth daily.    [provider]  ?meclizine  (ANTIVERT) 12.5 MG tablet Take 1 tablet (12.5 mg total) by mouth 3 (three) times daily as needed for dizziness. 04/30/19   Lacretia Leigh, MD  ?metFORMIN (GLUCOPHAGE-XR) 750 MG 24 hr tablet Take 750 mg by mouth 2 (two) times daily.    [provider]  ?metoprolol succinate (TOPROL-XL) 50 MG 24 hr tablet Take 1 tablet (50 mg total) by mouth daily. Take with or immediately following a meal. 05/29/12   Dhungel, Nishant, MD  ?naphazoline-pheniramine (NAPHCON-A) 0.025-0.3 % ophthalmic solution 1 drop 4 (four) times daily as needed for irritation.    [provider]  ?ondansetron (ZOFRAN) 4 MG tablet Take 1 tablet (4 mg total) by mouth every 6 (six) hours. 02/11/16   Tegeler, Gwenyth Allegra, MD  ?   ? ?Allergies    ?Motrin [ibuprofen]   ? ?Review of Systems   ?Review of Systems  ?Constitutional:   ?     Per HPI, otherwise negative  ?HENT:    ?     Per HPI, otherwise negative  ?Respiratory:    ?     Per HPI, otherwise negative  ?Cardiovascular:   ?     Per HPI, otherwise negative  ?Gastrointestinal:  Negative for vomiting.  ?Endocrine:  ?     Negative aside from HPI  ?Genitourinary:   ?     Neg aside from HPI   ?Musculoskeletal:   ?     Per HPI, otherwise negative  ?Skin: Negative.   ?Neurological:  Negative for syncope.  ? ?Physical Exam ?Updated Vital Signs ?BP (!) 150/96  Pulse 85   Temp 98 ?F (36.7 ?C) (Oral)   Resp 18   Ht '5\' 9"'$  (1.753 m)   LMP 06/12/2011   SpO2 94%   BMI 39.28 kg/m?  ?Physical Exam ?Vitals and nursing note reviewed.  ?Constitutional:   ?   General: She is not in acute distress. ?   Appearance: She is well-developed.  ?HENT:  ?   Head: Normocephalic and atraumatic.  ?Eyes:  ?   Conjunctiva/sclera: Conjunctivae normal.  ?Cardiovascular:  ?   Rate and Rhythm: Normal rate and regular rhythm.  ?Pulmonary:  ?   Effort: Pulmonary effort is normal. Tachypnea present.  ?   Breath sounds: Decreased breath sounds and wheezing present.  ?Abdominal:  ?   General: There is no distension.  ?    Tenderness: There is abdominal tenderness in the epigastric area.  ?Skin: ?   General: Skin is warm and dry.  ?Neurological:  ?   Mental Status: She is alert and oriented to person, place, and time.  ?   Cranial Nerves: No cranial nerve deficit.  ?Psychiatric:     ?   Mood and Affect: Mood normal.  ? ? ?ED Results / Procedures / Treatments   ?Labs ?(all labs ordered are listed, but only abnormal results are displayed) ?Labs Reviewed  ?RESP PANEL BY RT-PCR (FLU A&B, COVID) ARPGX2 - Abnormal; Notable for the following components:  ?    Result Value  ? SARS Coronavirus 2 by RT PCR POSITIVE (*)   ? All other components within normal limits  ?COMPREHENSIVE METABOLIC PANEL - Abnormal; Notable for the following components:  ? Potassium 3.4 (*)   ? Glucose, Bld 65 (*)   ? Calcium 8.7 (*)   ? Albumin 3.1 (*)   ? All other components within normal limits  ?CBC WITH DIFFERENTIAL/PLATELET - Abnormal; Notable for the following components:  ? WBC 3.3 (*)   ? RBC 5.79 (*)   ? Hemoglobin 16.8 (*)   ? HCT 52.6 (*)   ? Platelets 135 (*)   ? Neutro Abs 1.6 (*)   ? All other components within normal limits  ?BRAIN NATRIURETIC PEPTIDE  ?LIPASE, BLOOD  ?ETHANOL  ? ? ?EKG ?EKG Interpretation ? ?Date/Time:  Wednesday August 18 2021 09:31:01 EDT ?Ventricular Rate:  71 ?PR Interval:  176 ?QRS Duration: 94 ?QT Interval:  425 ?QTC Calculation: 462 ?R Axis:   -34 ?Text Interpretation: Sinus rhythm Left axis deviation Artifact Abnormal ECG Confirmed by Carmin Muskrat [phone]) on 08/18/2021 10:38:25 AM ? ?Radiology ?DG Chest 2 View ? ?Result Date: 08/18/2021 ?CLINICAL DATA:  61 year old female with shortness of breath and cough. EXAM: CHEST - 2 VIEW COMPARISON:  03/25/2019 chest radiographs and earlier. FINDINGS: Mildly low lung volumes. Mediastinal contours are stable and within normal limits. Visualized tracheal air column is within normal limits. No pneumothorax, pulmonary edema, pleural effusion or confluent pulmonary opacity. No acute osseous  abnormality identified. Negative visible bowel gas. IMPRESSION: No acute cardiopulmonary abnormality. Electronically Signed   By: Genevie Ann M.D.   On: 08/18/2021 08:44  ? ?CT Angio Chest PE W/Cm &/Or Wo Cm ? ?Result Date: 08/18/2021 ?CLINICAL DATA:  Shortness of breath and cough, COVID positive EXAM: CT ANGIOGRAPHY CHEST WITH CONTRAST TECHNIQUE: Multidetector CT imaging of the chest was performed using the standard protocol during bolus administration of intravenous contrast. Multiplanar CT image reconstructions and MIPs were obtained to evaluate the vascular anatomy. RADIATION DOSE REDUCTION: This exam was performed according to the departmental dose-optimization program  which includes automated exposure control, adjustment of the mA and/or kV according to patient size and/or use of iterative reconstruction technique. CONTRAST:  110m OMNIPAQUE IOHEXOL 350 MG/ML SOLN COMPARISON:  None FINDINGS: Cardiovascular: Adequate contrast opacification of the pulmonary arteries. No evidence of pulmonary embolus. Normal heart size. No pericardial effusion. Mild atherosclerotic disease of the thoracic aorta. Mediastinum/Nodes: Esophagus and thyroid are unremarkable. Mildly enlarged hilar lymph nodes, likely reactive. Reference right hilar lymph node measuring 1.3 cm in short axis on series 4, image 59. Lungs/Pleura: Central airways are patent. Bilateral bronchial wall thickening. Bilateral mosaic attenuation which is favored to be due to air trapping. No focal consolidation. No pleural effusion or pneumothorax. Upper Abdomen: Right adrenal gland nodule measuring 1.7 cm with low attenuation, compatible with benign adenoma, it no follow-up imaging recommended. Musculoskeletal: No chest wall abnormality. No acute or significant osseous findings. Review of the MIP images confirms the above findings. IMPRESSION: 1. No evidence of pulmonary embolus. 2. Bilateral bronchial wall thickening, findings can be seen in the setting of  bronchitis. 3. Diffuse mosaic attenuation, likely due to air trapping, which can be seen in the setting of small airways disease. 4. Aortic Atherosclerosis (ICD10-I70.0). Electronically Signed   By: LHosie PoissonD.

## 2021-08-19 DIAGNOSIS — U071 COVID-19: Secondary | ICD-10-CM | POA: Diagnosis not present

## 2021-08-19 LAB — GLUCOSE, CAPILLARY
Glucose-Capillary: 306 mg/dL — ABNORMAL HIGH (ref 70–99)
Glucose-Capillary: 277 mg/dL — ABNORMAL HIGH (ref 70–99)
Glucose-Capillary: 296 mg/dL — ABNORMAL HIGH (ref 70–99)
Glucose-Capillary: 350 mg/dL — ABNORMAL HIGH (ref 70–99)

## 2021-08-19 LAB — COMPREHENSIVE METABOLIC PANEL
ALT: 13 U/L (ref 0–44)
AST: 15 U/L (ref 15–41)
Albumin: 3.1 g/dL — ABNORMAL LOW (ref 3.5–5.0)
Alkaline Phosphatase: 71 U/L (ref 38–126)
Anion gap: 8 (ref 5–15)
BUN: 10 mg/dL (ref 8–23)
CO2: 24 mmol/L (ref 22–32)
Calcium: 9 mg/dL (ref 8.9–10.3)
Chloride: 101 mmol/L (ref 98–111)
Creatinine, Ser: 0.69 mg/dL (ref 0.44–1.00)
GFR, Estimated: >60 mL/min (ref >60)
Glucose, Bld: 309 mg/dL — ABNORMAL HIGH (ref 70–99)
Potassium: 4.3 mmol/L (ref 3.5–5.1)
Sodium: 133 mmol/L — ABNORMAL LOW (ref 135–145)
Total Bilirubin: 0.2 mg/dL — ABNORMAL LOW (ref 0.3–1.2)
Total Protein: 6.9 g/dL (ref 6.5–8.1)

## 2021-08-19 LAB — CBC WITH DIFFERENTIAL/PLATELET
Abs Immature Granulocytes: 0.01 10*3/uL (ref 0.00–0.07)
Basophils Absolute: 0 10*3/uL (ref 0.0–0.1)
Basophils Relative: 0 %
Eosinophils Absolute: 0 10*3/uL (ref 0.0–0.5)
Eosinophils Relative: 0 %
HCT: 51.2 % — ABNORMAL HIGH (ref 36.0–46.0)
Hemoglobin: 16.3 g/dL — ABNORMAL HIGH (ref 12.0–15.0)
Immature Granulocytes: 0 %
Lymphocytes Relative: 21 %
Lymphs Abs: 0.5 10*3/uL — ABNORMAL LOW (ref 0.7–4.0)
MCH: 29.1 pg (ref 26.0–34.0)
MCHC: 31.8 g/dL (ref 30.0–36.0)
MCV: 91.3 fL (ref 80.0–100.0)
Monocytes Absolute: 0.1 10*3/uL (ref 0.1–1.0)
Monocytes Relative: 4 %
Neutro Abs: 1.7 10*3/uL (ref 1.7–7.7)
Neutrophils Relative %: 75 %
Platelets: 133 10*3/uL — ABNORMAL LOW (ref 150–400)
RBC: 5.61 MIL/uL — ABNORMAL HIGH (ref 3.87–5.11)
RDW: 14.6 % (ref 11.5–15.5)
WBC: 2.3 10*3/uL — ABNORMAL LOW (ref 4.0–10.5)
nRBC: 0 % (ref 0.0–0.2)

## 2021-08-19 LAB — FERRITIN: Ferritin: 29 ng/mL (ref 11–307)

## 2021-08-19 LAB — D-DIMER, QUANTITATIVE: D-Dimer, Quant: 2.19 ug/mL-FEU — ABNORMAL HIGH (ref 0.00–0.50)

## 2021-08-19 LAB — C-REACTIVE PROTEIN: CRP: 0.9 mg/dL (ref <1.0)

## 2021-08-19 LAB — MAGNESIUM: Magnesium: 1.7 mg/dL (ref 1.7–2.4)

## 2021-08-19 MED ORDER — METHYLPREDNISOLONE SODIUM SUCC 125 MG IJ SOLR
60.0000 mg | Freq: Once | INTRAMUSCULAR | Status: AC
  Administered 2021-08-19: 60 mg via INTRAVENOUS
  Filled 2021-08-19: qty 2

## 2021-08-19 MED ORDER — MAGNESIUM SULFATE 2 GM/50ML IV SOLN
2.0000 g | Freq: Once | INTRAVENOUS | Status: AC
  Administered 2021-08-19: 2 g via INTRAVENOUS
  Filled 2021-08-19: qty 50

## 2021-08-19 MED ORDER — INSULIN ASPART 100 UNIT/ML IJ SOLN
2.0000 [IU] | Freq: Three times a day (TID) | INTRAMUSCULAR | Status: DC
  Administered 2021-08-19: 2 [IU] via SUBCUTANEOUS

## 2021-08-19 MED ORDER — GUAIFENESIN ER 600 MG PO TB12
600.0000 mg | ORAL_TABLET | Freq: Two times a day (BID) | ORAL | Status: DC
Start: 2021-08-19 — End: 2021-08-22
  Administered 2021-08-19 – 2021-08-22 (×7): 600 mg via ORAL
  Filled 2021-08-19 (×7): qty 1

## 2021-08-19 MED ORDER — METHYLPREDNISOLONE SODIUM SUCC 125 MG IJ SOLR: 60.0000 mg | INTRAMUSCULAR | Status: DC

## 2021-08-19 MED ORDER — LOSARTAN POTASSIUM 50 MG PO TABS
50.0000 mg | ORAL_TABLET | Freq: Every day | ORAL | Status: DC
  Administered 2021-08-19 – 2021-08-20 (×2): 50 mg via ORAL
  Filled 2021-08-19 (×2): qty 1

## 2021-08-19 MED ORDER — METOPROLOL SUCCINATE ER 50 MG PO TB24
50.0000 mg | ORAL_TABLET | Freq: Every day | ORAL | Status: DC
  Administered 2021-08-19 – 2021-08-22 (×4): 50 mg via ORAL
  Filled 2021-08-19 (×4): qty 1

## 2021-08-19 MED ORDER — INSULIN ASPART 100 UNIT/ML IJ SOLN
3.0000 [IU] | Freq: Three times a day (TID) | INTRAMUSCULAR | Status: DC
  Administered 2021-08-19 – 2021-08-20 (×4): 3 [IU] via SUBCUTANEOUS

## 2021-08-19 MED ORDER — PREDNISONE 50 MG PO TABS
50.0000 mg | ORAL_TABLET | Freq: Every day | ORAL | Status: DC
  Administered 2021-08-20: 50 mg via ORAL
  Filled 2021-08-19 (×2): qty 1

## 2021-08-19 MED ORDER — ONDANSETRON 4 MG PO TBDP
4.0000 mg | ORAL_TABLET | Freq: Once | ORAL | Status: AC
  Administered 2021-08-19: 4 mg via ORAL
  Filled 2021-08-19: qty 1

## 2021-08-19 NOTE — Progress Notes (Signed)
?PROGRESS NOTE ? ? ? Lisa Ibarra  YFV:[id number] DOB: 07/08/1960 DOA: 08/18/2021 ?PCP: Rudene Anda, MD  ? ? ? ?Brief Narrative:  ?H/o  NIDDM2, HTN, asthma, tobacco use ( cutting down to a few cigarette a day), presented with dyspnea, wheezing, hypoxia, she was put on 7 L of oxygen on admission ?+ covid 19 ? ? ?Subjective: ? ?Feeling better, now on 3 L of oxygen, less cough, less wheezing, less tachypnea ?Blood pressure elevated, blood glucose elevated ? ?Assessment & Plan: ? Principal Problem: ?  COVID-19 ?Active Problems: ?  Essential hypertension ?  Asthma exacerbation ?  Diabetes mellitus ([place]) ?  Hypokalemia ?  Thrombocytopenia ([place]) ?  SIRS (systemic inflammatory response syndrome) (HCC) ?  Acute respiratory failure with hypoxia ([place]) ? ? ? ?Assessment and Plan: ? ?Acute hypoxic respiratory failure in the setting of COVID-19 infection and asthma exacerbation ?CTA no PE, did show bronchitis changes with bilateral bronchial wall thickening, diffuse mosaic attenuation likely due to air trapping and small airway disease ?COVID-19 infection: Started on Paxlovid and steroid, follow-up on inflammatory markers ?Asthma exacerbation: Continue steroid, nebulizer ?Add on Mucinex, IV mag, taper steroid as able, wean oxygen ? ?Hypertension ?Blood pressure elevated, resume beta-blocker and losartan ? ?Noninsulin-dependent type 2 diabetes with hyperglycemia ?A1c 6.7 ?Hyperglycemia likely due to stress and steroid ?Expect improvement with taper steroid and recovering from acute illness ?Continue sliding scale, add on meal coverage ?Continue carb modified diet ? ?Other findings on CTA chest need to follow-up with PCP: ?Aortic atherosclerosis ?Mild atherosclerotic disease of thoracic aorta ,finding on CTA chest ?Follow-up with PCP ? ?Right adrenal gland nodule measuring 1.7 cm with low ?attenuation, compatible with benign adenoma ?Follow-up with PCP ? ?Cigarette smoking ?Smoking cessation education ?Report in the process of  cutting down ?Report not able to tolerate nicotine patch in the past ? ?Mild thrombocytopenia, likely due to acute COVID infection, expect recovery ? ?Erythrocytosis ?Possible related to COVID infection and dehydration ?Smoking cessation education ?Encourage oral intake ?Follow-up with PCP ? ?Hypokalemia/hypomagnesemia ?Replace ? ?: Body mass index is 39.28 kg/m?Marland Kitchen.  Meet obesity criteria ?   ? ? ? ?I have Reviewed nursing notes, Vitals, pain scores, I/o's, Lab results and  imaging results since pt's last encounter, details please see discussion above  ?I ordered the following labs:  ?Unresulted Labs (From admission, onward)  ? ?  Start     Ordered  ? 08/20/21 0500  Brain natriuretic peptide  Tomorrow morning,   R       ? 08/19/21 0828  ? 08/19/21 0500  CBC with Differential/Platelet  Daily,   R     ? 08/18/21 1511  ? 08/19/21 0500  Comprehensive metabolic panel  Daily,   R     ? 08/18/21 1511  ? 08/19/21 0500  C-reactive protein  Daily,   R     ? 08/18/21 1511  ? 08/19/21 0500  D-dimer, quantitative  Daily,   R     ? 08/18/21 1511  ? 08/19/21 0500  Ferritin  Daily,   R     ? 08/18/21 1511  ? 08/19/21 0500  Magnesium  Daily,   R     ? 08/18/21 1511  ? ?  ?  ? ?  ? ? ? ?DVT prophylaxis: SCDs Start: 08/18/21 1512 ? ? ?Code Status:   Code Status: Full Code ? ?Family Communication: patient  ?Disposition:  ? ?Status is: Inpatient ? ?Dispo: The patient is from: Home ?  Anticipated d/c is to: Home ?             Anticipated d/c date is: Wean oxygen, wean steroid, possible home on 3/31 ? ?Antimicrobials:   ? ?Anti-infectives (From admission, onward)  ? ? Start     Dose/Rate Route Frequency Ordered Stop  ? 08/18/21 1330  nirmatrelvir/ritonavir EUA (PAXLOVID) 3 tablet       ? 3 tablet Oral 2 times daily 08/18/21 1223 08/23/21 0959  ? ?  ? ? ? ? ? ?Objective: ?Vitals:  ? 08/19/21 0457 08/19/21 0900 08/19/21 1139 08/19/21 1320  ?BP: (!) 183/103 (!) 167/104 (!) 187/99 (!) 153/82  ?Pulse: 75  69   ?Resp: 13 (!) 22 18 (!)  24  ?Temp: 97.8 ?F (36.6 ?C) 98.6 ?F (37 ?C) 98 ?F (36.7 ?C)   ?TempSrc: Oral Oral Oral   ?SpO2:  95% 93% 93%  ?Weight:      ?Height:      ? ? ?Intake/Output Summary (Last 24 hours) at 08/19/2021 1435 ?Last data filed at 08/19/2021 1318 ?Gross per 24 hour  ?Intake 940.5 ml  ?Output --  ?Net 940.5 ml  ? ?Filed Weights  ? 08/18/21 1600  ?Weight: 120.7 kg  ? ? ?Examination: ? ?General exam: alert, awake, communicative,calm, NAD ?Respiratory system: tight, mild bibasilar wheezing,  Respiratory effort normal. ?Cardiovascular system:  RRR.  ?Gastrointestinal system: Abdomen is nondistended, soft and nontender.  Normal bowel sounds heard. ?Central nervous system: Alert and oriented. No focal neurological deficits. ?Extremities:  no edema ?Skin: No rashes, lesions or ulcers ?Psychiatry: Judgement and insight appear normal. Mood & affect appropriate.  ? ? ? ?Data Reviewed: I have personally reviewed  labs and visualized  imaging studies since the last encounter and formulate the plan  ? ? ? ? ? ? ?Scheduled Meds: ? vitamin C  500 mg Oral Daily  ? guaiFENesin  600 mg Oral BID  ? insulin aspart  0-15 Units Subcutaneous TID WC  ? insulin aspart  3 Units Subcutaneous TID WC  ? Ipratropium-Albuterol  1 puff Inhalation Q6H  ? losartan  50 mg Oral Daily  ? metoprolol succinate  50 mg Oral Daily  ? nirmatrelvir/ritonavir EUA  3 tablet Oral BID  ? pantoprazole  40 mg Oral Daily  ? [START ON 08/20/2021] predniSONE  50 mg Oral Q breakfast  ? zinc sulfate  220 mg Oral Daily  ? ?Continuous Infusions: ? ? ? ? LOS: 1 day  ? ? ?Florencia Reasons, MD PhD FACP ?[facility] ? ?Available via Epic secure chat 7am-7pm for nonurgent issues ?Please page for urgent issues ?To page the attending provider between 7A-7P or the covering provider during after hours 7P-7A, please log into the web site [email or web address] and access using universal [place] password for that web site. If you do not have the password, please call the hospital  operator. ? ? ? ?08/19/2021, 2:35 PM  ? ? ?

## 2021-08-19 NOTE — Discharge Instructions (Signed)

## 2021-08-19 NOTE — Plan of Care (Addendum)
Makena has been weaned from [place] to 2lnc today.  She does report that she has had some DOE just moving around in the room.  Her appetite is fair and she has been afebrile.  ?Problem: Clinical Measurements: ?Goal: Ability to maintain clinical measurements within normal limits will improve ?Outcome: Progressing ?Goal: Will remain free from infection ?Outcome: Progressing ?Goal: Diagnostic test results will improve ?Outcome: Progressing ?Goal: Respiratory complications will improve ?Outcome: Progressing ?Goal: Cardiovascular complication will be avoided ?Outcome: Progressing ?  ?Problem: Activity: ?Goal: Risk for activity intolerance will decrease ?Outcome: Progressing ?  ?Problem: Safety: ?Goal: Ability to remain free from injury will improve ?Outcome: Progressing ?  ?Problem: Education: ?Goal: Knowledge of risk factors and measures for prevention of condition will improve ?Outcome: Progressing ?  ?Problem: Respiratory: ?Goal: Complications related to the disease process, condition or treatment will be avoided or minimized ?Outcome: Progressing ?  ?

## 2021-08-19 NOTE — Progress Notes (Signed)
SATURATION QUALIFICATIONS: (This note is used to comply with regulatory documentation for home oxygen) ? ?Patient Saturations on Room Air at Rest = 95% ? ?Patient Saturations on Room Air while Ambulating = 85 % ? ?Patient Saturations on 2 Liters of oxygen while Ambulating = 93% ? ?Please briefly explain why patient needs home oxygen: ?

## 2021-08-19 NOTE — TOC Initial Note (Signed)
Transition of Care (TOC) - Initial/Assessment Note  ? ? ?Patient Details  ?Name: Lisa Ibarra ?MRN: [id number] ?Date of Birth: June 07, 1960 ? ?Transition of Care [facility]) CM/SW Contact:    ?Dessa Phi, RN ?Phone Number: ?08/19/2021, 1:51 PM ? ?Clinical Narrative:  From home. On 02 will monitor.               ? ? ?Expected Discharge Plan: Home/Self Care ?Barriers to Discharge: Continued Medical Work up ? ? ?Patient Goals and CMS Choice ?Patient states their goals for this hospitalization and ongoing recovery are:: Home ?CMS Medicare.gov Compare Post Acute Care list provided to:: Patient ?Choice offered to / list presented to : Patient ? ?Expected Discharge Plan and Services ?Expected Discharge Plan: Home/Self Care ?  ?Discharge Planning Services: CM Consult ?Post Acute Care Choice: NA ?Living arrangements for the past 2 months: [place] ?                ?  ?  ?  ?  ?  ?  ?  ?  ?  ?  ? ?Prior Living Arrangements/Services ?Living arrangements for the past 2 months: [place] ?Lives with:: Self ?Patient language and need for interpreter reviewed:: Yes ?Do you feel safe going back to the place where you live?: Yes      ?Need for Family Participation in Patient Care: Yes (Comment) ?Care giver support system in place?: Yes (comment) ?  ?Criminal Activity/Legal Involvement Pertinent to Current Situation/Hospitalization: No - Comment as needed ? ?Activities of Daily Living ?Home Assistive Devices/Equipment: None ?ADL Screening (condition at time of admission) ?Patient's cognitive ability adequate to safely complete daily activities?: Yes ?Is the patient deaf or have difficulty hearing?: No ?Does the patient have difficulty seeing, even when wearing glasses/contacts?: No ?Does the patient have difficulty concentrating, remembering, or making decisions?: No ?Patient able to express need for assistance with ADLs?: Yes ?Does the patient have difficulty dressing or bathing?: No ?Independently performs ADLs?: Yes  (appropriate for developmental age) ?Does the patient have difficulty walking or climbing stairs?: No ?Weakness of Legs: None ?Weakness of Arms/Hands: None ? ?Permission Sought/Granted ?Permission sought to share information with : Case Manager ?Permission granted to share information with : Yes, Verbal Permission Granted ? Share Information with NAME: Case Manager ?   ?   ?   ? ?Emotional Assessment ?Appearance:: Appears stated age ?Attitude/Demeanor/Rapport: Gracious ?Affect (typically observed): Accepting ?Orientation: : Oriented to Self, Oriented to Place, Oriented to  Time, Oriented to Situation ?Alcohol / Substance Use: Not Applicable ?Psych Involvement: No (comment) ? ?Admission diagnosis:  COVID [U07.1] ?COVID-19 [U07.1] ?Patient Active Problem List  ? Diagnosis Date Noted  ? COVID-19 08/18/2021  ? Hypokalemia 08/18/2021  ? Thrombocytopenia ([place]) 08/18/2021  ? SIRS (systemic inflammatory response syndrome) ([place]) 08/18/2021  ? Acute respiratory failure with hypoxia ([place]) 08/18/2021  ? Uncontrolled diabetes mellitus 05/29/2012  ? Influenza A H1N1 infection 05/28/2012  ? Acute bronchitis 05/27/2012  ? Asthma exacerbation 05/26/2012  ? Diabetes mellitus ([place]) 05/26/2012  ? Diarrhea 05/26/2012  ? Essential hypertension 01/05/2007  ? HEADACHE 01/05/2007  ? CERVICAL CANCER, HX OF 01/05/2007  ? ?PCP:  Rudene Anda, MD ?Pharmacy:   ?[place], [place] - [address] ?[place] ?[place] [address] ?Phone: [phone] Fax: [phone] ? ? ? ? ?Social Determinants of Health (SDOH) Interventions ?  ? ?Readmission Risk Interventions ?   ? View : No data to display.  ?  ?  ?  ? ? ? ?

## 2021-08-20 DIAGNOSIS — U071 COVID-19: Secondary | ICD-10-CM | POA: Diagnosis not present

## 2021-08-20 LAB — CBC WITH DIFFERENTIAL/PLATELET
Abs Immature Granulocytes: 0.07 10*3/uL (ref 0.00–0.07)
Basophils Absolute: 0 10*3/uL (ref 0.0–0.1)
Basophils Relative: 0 %
Eosinophils Absolute: 0 10*3/uL (ref 0.0–0.5)
Eosinophils Relative: 0 %
HCT: 52 % — ABNORMAL HIGH (ref 36.0–46.0)
Hemoglobin: 16.6 g/dL — ABNORMAL HIGH (ref 12.0–15.0)
Immature Granulocytes: 1 %
Lymphocytes Relative: 8 %
Lymphs Abs: 0.8 10*3/uL (ref 0.7–4.0)
MCH: 28.9 pg (ref 26.0–34.0)
MCHC: 31.9 g/dL (ref 30.0–36.0)
MCV: 90.4 fL (ref 80.0–100.0)
Monocytes Absolute: 0.6 10*3/uL (ref 0.1–1.0)
Monocytes Relative: 6 %
Neutro Abs: 8.1 10*3/uL — ABNORMAL HIGH (ref 1.7–7.7)
Neutrophils Relative %: 85 %
Platelets: 163 10*3/uL (ref 150–400)
RBC: 5.75 MIL/uL — ABNORMAL HIGH (ref 3.87–5.11)
RDW: 14.5 % (ref 11.5–15.5)
WBC: 9.5 10*3/uL (ref 4.0–10.5)
nRBC: 0 % (ref 0.0–0.2)

## 2021-08-20 LAB — COMPREHENSIVE METABOLIC PANEL
ALT: 15 U/L (ref 0–44)
AST: 20 U/L (ref 15–41)
Albumin: 3.2 g/dL — ABNORMAL LOW (ref 3.5–5.0)
Alkaline Phosphatase: 87 U/L (ref 38–126)
Anion gap: 9 (ref 5–15)
BUN: 15 mg/dL (ref 8–23)
CO2: 26 mmol/L (ref 22–32)
Calcium: 9 mg/dL (ref 8.9–10.3)
Chloride: 100 mmol/L (ref 98–111)
Creatinine, Ser: 0.74 mg/dL (ref 0.44–1.00)
GFR, Estimated: >60 mL/min (ref >60)
Glucose, Bld: 294 mg/dL — ABNORMAL HIGH (ref 70–99)
Potassium: 4.1 mmol/L (ref 3.5–5.1)
Sodium: 135 mmol/L (ref 135–145)
Total Bilirubin: 0.3 mg/dL (ref 0.3–1.2)
Total Protein: 7.3 g/dL (ref 6.5–8.1)

## 2021-08-20 LAB — GLUCOSE, CAPILLARY
Glucose-Capillary: 378 mg/dL — ABNORMAL HIGH (ref 70–99)
Glucose-Capillary: 338 mg/dL — ABNORMAL HIGH (ref 70–99)
Glucose-Capillary: 306 mg/dL — ABNORMAL HIGH (ref 70–99)
Glucose-Capillary: 409 mg/dL — ABNORMAL HIGH (ref 70–99)

## 2021-08-20 LAB — BRAIN NATRIURETIC PEPTIDE: B Natriuretic Peptide: 155 pg/mL — ABNORMAL HIGH (ref 0.0–100.0)

## 2021-08-20 LAB — MAGNESIUM: Magnesium: 2 mg/dL (ref 1.7–2.4)

## 2021-08-20 LAB — C-REACTIVE PROTEIN: CRP: 0.7 mg/dL (ref <1.0)

## 2021-08-20 LAB — FERRITIN: Ferritin: 37 ng/mL (ref 11–307)

## 2021-08-20 LAB — D-DIMER, QUANTITATIVE: D-Dimer, Quant: 1.93 ug/mL-FEU — ABNORMAL HIGH (ref 0.00–0.50)

## 2021-08-20 MED ORDER — ENOXAPARIN SODIUM 60 MG/0.6ML IJ SOSY
60.0000 mg | PREFILLED_SYRINGE | INTRAMUSCULAR | Status: DC
  Administered 2021-08-20 – 2021-08-22 (×3): 60 mg via SUBCUTANEOUS
  Filled 2021-08-20 (×3): qty 0.6

## 2021-08-20 MED ORDER — INSULIN DETEMIR 100 UNIT/ML ~~LOC~~ SOLN
8.0000 [IU] | Freq: Two times a day (BID) | SUBCUTANEOUS | Status: DC
  Filled 2021-08-20: qty 0.08

## 2021-08-20 MED ORDER — INSULIN ASPART 100 UNIT/ML IJ SOLN
5.0000 [IU] | Freq: Three times a day (TID) | INTRAMUSCULAR | Status: DC
  Administered 2021-08-21 – 2021-08-22 (×5): 5 [IU] via SUBCUTANEOUS

## 2021-08-20 MED ORDER — LABETALOL HCL 5 MG/ML IV SOLN
5.0000 mg | INTRAVENOUS | Status: DC | PRN
  Administered 2021-08-20 – 2021-08-22 (×4): 5 mg via INTRAVENOUS
  Filled 2021-08-20 (×4): qty 4

## 2021-08-20 MED ORDER — PREDNISONE 20 MG PO TABS
40.0000 mg | ORAL_TABLET | Freq: Every day | ORAL | Status: DC
  Administered 2021-08-21: 40 mg via ORAL
  Filled 2021-08-20: qty 2

## 2021-08-20 MED ORDER — HYDRALAZINE HCL 10 MG PO TABS
10.0000 mg | ORAL_TABLET | Freq: Three times a day (TID) | ORAL | Status: DC | PRN
  Administered 2021-08-20 – 2021-08-22 (×3): 10 mg via ORAL
  Filled 2021-08-20 (×3): qty 1

## 2021-08-20 MED ORDER — INSULIN DETEMIR 100 UNIT/ML ~~LOC~~ SOLN
5.0000 [IU] | Freq: Two times a day (BID) | SUBCUTANEOUS | Status: DC
  Administered 2021-08-20 (×2): 5 [IU] via SUBCUTANEOUS
  Filled 2021-08-20 (×2): qty 0.05

## 2021-08-20 NOTE — TOC Progression Note (Signed)
Transition of Care (TOC) - Progression Note  ? ? ?Patient Details  ?Name: Lisa Ibarra ?MRN: [id number] ?Date of Birth: 05-May-1961 ? ?Transition of Care (TOC) CM/SW Contact  ?Dessa Phi, RN ?Phone Number: ?08/20/2021, 11:43 AM ? ?Clinical Narrative: Noted on 02-[employer] monitoring if qualifies for home 02. Per attending will have 02 checked again in am, & order if needed.   ? ? ? ?Expected Discharge Plan: Home/Self Care ?Barriers to Discharge: Continued Medical Work up ? ?Expected Discharge Plan and Services ?Expected Discharge Plan: Home/Self Care ?  ?Discharge Planning Services: CM Consult ?Post Acute Care Choice: NA ?Living arrangements for the past 2 months: [place] ?Expected Discharge Date: 08/20/21               ?  ?  ?  ?  ?  ?  ?  ?  ?  ?  ? ? ?Social Determinants of Health (SDOH) Interventions ?  ? ?Readmission Risk Interventions ?   ? View : No data to display.  ?  ?  ?  ? ? ?

## 2021-08-20 NOTE — Progress Notes (Signed)
Inpatient Diabetes Program Recommendations ? ?AACE/ADA: New Consensus Statement on Inpatient Glycemic Control (2015) ? ?Target Ranges:  Prepandial:   less than 140 mg/dL ?     Peak postprandial:   less than 180 mg/dL (1-2 hours) ?     Critically ill patients:  140 - 180 mg/dL  ? ?Lab Results  ?Component Value Date  ? GLUCAP 338 (H) 08/20/2021  ? HGBA1C 6.7 (H) 08/18/2021  ? ? ?Review of Glycemic Control ? Latest Reference Range & Units 08/18/21 22:27 08/19/21 07:37 08/19/21 11:36 08/19/21 17:16 08/19/21 21:12 08/20/21 08:13 08/20/21 12:07  ?Glucose-Capillary 70 - 99 mg/dL 304 (H) 306 (H) 277 (H) 296 (H) 350 (H) 378 (H) 338 (H)  ? ?Diabetes history: DM 2 ?Outpatient Diabetes medications:  ?Glucotrol 10 mg bid, Metformin XR 1000 mg bid ?Current orders for Inpatient glycemic control:  ?Novolog moderate tid with meals ?Novolog 3 units tid with meals ?Prednisone 50 mg daily ? ?Inpatient Diabetes Program Recommendations:   ? ?May consider adding Levemir 10 units bid while on steroids.  ? ?Thanks,  ?Adah Perl, RN, BC-ADM ?Inpatient Diabetes Coordinator ?Pager [phone]  (8a-5p) ? ? ?

## 2021-08-20 NOTE — Plan of Care (Signed)
Lisa Ibarra reports feeling more fatigued today than yesterday. Her O2 sat on 1LNC moving about the room has been in the low 90's. She still reports DOE. Tussionex given once for cough and pt reported good effect.  ?Problem: Clinical Measurements: ?Goal: Ability to maintain clinical measurements within normal limits will improve ?Outcome: Progressing ?Goal: Will remain free from infection ?Outcome: Progressing ?Goal: Diagnostic test results will improve ?Outcome: Progressing ?Goal: Respiratory complications will improve ?Outcome: Progressing ?Goal: Cardiovascular complication will be avoided ?Outcome: Progressing ?  ?Problem: Activity: ?Goal: Risk for activity intolerance will decrease ?Outcome: Progressing ?  ?Problem: Safety: ?Goal: Ability to remain free from injury will improve ?Outcome: Progressing ?  ?Problem: Education: ?Goal: Knowledge of risk factors and measures for prevention of condition will improve ?Outcome: Progressing ?  ?Problem: Respiratory: ?Goal: Complications related to the disease process, condition or treatment will be avoided or minimized ?Outcome: Progressing ?  ?

## 2021-08-20 NOTE — Progress Notes (Signed)
?PROGRESS NOTE ? ? ? Lisa Ibarra  NUU:[id number] DOB: 05-22-1961 DOA: 08/18/2021 ?PCP: Rudene Anda, MD  ? ? ? ?Brief Narrative:  ?H/o  NIDDM2, HTN, asthma, tobacco use ( cutting down to a few cigarette a day), presented with dyspnea, wheezing, hypoxia, she was put on 7 L of oxygen on admission ?+ covid 19 ? ? ?Subjective: ? ?Still has some cough  ?Blood pressure elevated, blood glucose remain elevated ? ?Assessment & Plan: ? Principal Problem: ?  COVID-19 ?Active Problems: ?  Essential hypertension ?  Asthma exacerbation ?  Diabetes mellitus ([place]) ?  Hypokalemia ?  Thrombocytopenia ([place]) ?  SIRS (systemic inflammatory response syndrome) (HCC) ?  Acute respiratory failure with hypoxia ([place]) ? ? ? ?Assessment and Plan: ? ?Acute hypoxic respiratory failure in the setting of COVID-19 infection and asthma exacerbation ?CTA no PE, did show bronchitis changes with bilateral bronchial wall thickening, diffuse mosaic attenuation likely due to air trapping and small airway disease ?COVID-19 infection: Started on Paxlovid and steroid, follow-up on inflammatory markers ?Asthma exacerbation: Continue steroid, nebulizer, received iv mag, on mucinex ?Improving, taper steroid as able, wean oxygen ? ?Hypertension ?Blood pressure elevated, resume beta-blocker and losartan ? ?Noninsulin-dependent type 2 diabetes with hyperglycemia ?A1c 6.7 ?Hyperglycemia likely due to stress and steroid ?Expect improvement with taper steroid and recovering from acute illness ?Blood glucose remains elevated , add levemir, continue sliding scale, increase meal coverage ?Continue carb modified diet ? ?Other findings on CTA chest need to follow-up with PCP: ?Aortic atherosclerosis ?Mild atherosclerotic disease of thoracic aorta ,finding on CTA chest ?Follow-up with PCP ? ?Right adrenal gland nodule measuring 1.7 cm with low ?attenuation, compatible with benign adenoma ?Follow-up with PCP ? ?Cigarette smoking ?Smoking cessation education ?Report in the  process of cutting down ?Report not able to tolerate nicotine patch in the past ? ?Mild thrombocytopenia, likely due to acute COVID infection, resolved ? ?Erythrocytosis ?Possible related to COVID infection and dehydration ?Could also be from cigarette smoking ,smoking cessation education ?Follow-up with PCP ? ?Hypokalemia/hypomagnesemia ?Replaced, normalized  ? ?: Body mass index is 39.23 kg/m?Marland Kitchen.  Meet obesity criteria ?   ? ? ? ?I have Reviewed nursing notes, Vitals, pain scores, I/o's, Lab results and  imaging results since pt's last encounter, details please see discussion above  ?I ordered the following labs:  ?Unresulted Labs (From admission, onward)  ? ?  Start     Ordered  ? 08/21/21 3474  Basic metabolic panel  Tomorrow morning,   R       ?Question:  Specimen collection method  Answer:  Lab=Lab collect  ? 08/20/21 0852  ? 08/19/21 0500  CBC with Differential/Platelet  Daily,   R     ? 08/18/21 1511  ? 08/19/21 0500  Comprehensive metabolic panel  Daily,   R     ? 08/18/21 1511  ? 08/19/21 0500  C-reactive protein  Daily,   R     ? 08/18/21 1511  ? 08/19/21 0500  D-dimer, quantitative  Daily,   R     ? 08/18/21 1511  ? 08/19/21 0500  Ferritin  Daily,   R     ? 08/18/21 1511  ? 08/19/21 0500  Magnesium  Daily,   R     ? 08/18/21 1511  ? ?  ?  ? ?  ? ? ? ?DVT prophylaxis: SCDs Start: 08/18/21 1512 ? ? ?Code Status:   Code Status: Full Code ? ?Family Communication: patient  ?Disposition:  ? ?Status  is: Inpatient ? ?Dispo: The patient is from: Home ?             Anticipated d/c is to: Home ?             Anticipated d/c date is: Wean oxygen, wean steroid, possible home on 4/1 ? ?Antimicrobials:   ? ?Anti-infectives (From admission, onward)  ? ? Start     Dose/Rate Route Frequency Ordered Stop  ? 08/18/21 1330  nirmatrelvir/ritonavir EUA (PAXLOVID) 3 tablet       ? 3 tablet Oral 2 times daily 08/18/21 1223 08/23/21 0959  ? ?  ? ? ? ? ? ?Objective: ?Vitals:  ? 08/19/21 2115 08/20/21 0557 08/20/21 0817 08/20/21 1411   ?BP: (!) 180/102 (!) 191/92 (!) 176/86 (!) 175/90  ?Pulse: 64 63 75 69  ?Resp: 20 18    ?Temp: 98.6 ?F (37 ?C) 98.1 ?F (36.7 ?C)  97.8 ?F (36.6 ?C)  ?TempSrc: Oral Oral  Oral  ?SpO2: 96% (!) 87% 95% 95%  ?Weight:  120.5 kg    ?Height:      ? ? ?Intake/Output Summary (Last 24 hours) at 08/20/2021 1654 ?Last data filed at 08/20/2021 1346 ?Gross per 24 hour  ?Intake 1320 ml  ?Output --  ?Net 1320 ml  ? ?Filed Weights  ? 08/18/21 1600 08/20/21 0557  ?Weight: 120.7 kg 120.5 kg  ? ? ?Examination: ? ?General exam: alert, awake, communicative,calm, NAD ?Respiratory system:  improved aeration, wheezing appears has much improved , Respiratory effort normal. ?Cardiovascular system:  RRR.  ?Gastrointestinal system: Abdomen is nondistended, soft and nontender.  Normal bowel sounds heard. ?Central nervous system: Alert and oriented. No focal neurological deficits. ?Extremities:  no edema ?Skin: No rashes, lesions or ulcers ?Psychiatry: Judgement and insight appear normal. Mood & affect appropriate.  ? ? ? ?Data Reviewed: I have personally reviewed  labs and visualized  imaging studies since the last encounter and formulate the plan  ? ? ? ? ? ? ?Scheduled Meds: ? vitamin C  500 mg Oral Daily  ? enoxaparin (LOVENOX) injection  60 mg Subcutaneous Q24H  ? guaiFENesin  600 mg Oral BID  ? insulin aspart  0-15 Units Subcutaneous TID WC  ? insulin aspart  3 Units Subcutaneous TID WC  ? insulin detemir  5 Units Subcutaneous BID  ? Ipratropium-Albuterol  1 puff Inhalation Q6H  ? losartan  50 mg Oral Daily  ? metoprolol succinate  50 mg Oral Daily  ? nirmatrelvir/ritonavir EUA  3 tablet Oral BID  ? pantoprazole  40 mg Oral Daily  ? [START ON 08/21/2021] predniSONE  40 mg Oral Q breakfast  ? zinc sulfate  220 mg Oral Daily  ? ?Continuous Infusions: ? ? ? ? LOS: 2 days  ? ? ?Florencia Reasons, MD PhD FACP ?[facility] ? ?Available via Epic secure chat 7am-7pm for nonurgent issues ?Please page for urgent issues ?To page the attending provider  between 7A-7P or the covering provider during after hours 7P-7A, please log into the web site [email or web address] and access using universal [place] password for that web site. If you do not have the password, please call the hospital operator. ? ? ? ?08/20/2021, 4:54 PM  ? ? ?

## 2021-08-21 ENCOUNTER — Inpatient Hospital Stay (HOSPITAL_COMMUNITY): Payer: BC Managed Care – PPO

## 2021-08-21 DIAGNOSIS — U071 COVID-19: Secondary | ICD-10-CM | POA: Diagnosis not present

## 2021-08-21 LAB — C-REACTIVE PROTEIN: CRP: 0.7 mg/dL (ref <1.0)

## 2021-08-21 LAB — CBC WITH DIFFERENTIAL/PLATELET
Abs Immature Granulocytes: 0.17 10*3/uL — ABNORMAL HIGH (ref 0.00–0.07)
Basophils Absolute: 0 10*3/uL (ref 0.0–0.1)
Basophils Relative: 0 %
Eosinophils Absolute: 0 10*3/uL (ref 0.0–0.5)
Eosinophils Relative: 0 %
HCT: 50.7 % — ABNORMAL HIGH (ref 36.0–46.0)
Hemoglobin: 16.5 g/dL — ABNORMAL HIGH (ref 12.0–15.0)
Immature Granulocytes: 2 %
Lymphocytes Relative: 8 %
Lymphs Abs: 0.9 10*3/uL (ref 0.7–4.0)
MCH: 29.2 pg (ref 26.0–34.0)
MCHC: 32.5 g/dL (ref 30.0–36.0)
MCV: 89.6 fL (ref 80.0–100.0)
Monocytes Absolute: 0.6 10*3/uL (ref 0.1–1.0)
Monocytes Relative: 6 %
Neutro Abs: 9.5 10*3/uL — ABNORMAL HIGH (ref 1.7–7.7)
Neutrophils Relative %: 84 %
Platelets: 170 10*3/uL (ref 150–400)
RBC: 5.66 MIL/uL — ABNORMAL HIGH (ref 3.87–5.11)
RDW: 14.4 % (ref 11.5–15.5)
WBC: 11.2 10*3/uL — ABNORMAL HIGH (ref 4.0–10.5)
nRBC: 0 % (ref 0.0–0.2)

## 2021-08-21 LAB — MAGNESIUM: Magnesium: 1.9 mg/dL (ref 1.7–2.4)

## 2021-08-21 LAB — COMPREHENSIVE METABOLIC PANEL
ALT: 17 U/L (ref 0–44)
AST: 19 U/L (ref 15–41)
Albumin: 3 g/dL — ABNORMAL LOW (ref 3.5–5.0)
Alkaline Phosphatase: 90 U/L (ref 38–126)
Anion gap: 7 (ref 5–15)
BUN: 17 mg/dL (ref 8–23)
CO2: 31 mmol/L (ref 22–32)
Calcium: 9 mg/dL (ref 8.9–10.3)
Chloride: 97 mmol/L — ABNORMAL LOW (ref 98–111)
Creatinine, Ser: 0.65 mg/dL (ref 0.44–1.00)
GFR, Estimated: >60 mL/min (ref >60)
Glucose, Bld: 321 mg/dL — ABNORMAL HIGH (ref 70–99)
Potassium: 3.5 mmol/L (ref 3.5–5.1)
Sodium: 135 mmol/L (ref 135–145)
Total Bilirubin: 0.2 mg/dL — ABNORMAL LOW (ref 0.3–1.2)
Total Protein: 6.9 g/dL (ref 6.5–8.1)

## 2021-08-21 LAB — D-DIMER, QUANTITATIVE: D-Dimer, Quant: 1.65 ug/mL-FEU — ABNORMAL HIGH (ref 0.00–0.50)

## 2021-08-21 LAB — GLUCOSE, CAPILLARY
Glucose-Capillary: 282 mg/dL — ABNORMAL HIGH (ref 70–99)
Glucose-Capillary: 321 mg/dL — ABNORMAL HIGH (ref 70–99)
Glucose-Capillary: 394 mg/dL — ABNORMAL HIGH (ref 70–99)
Glucose-Capillary: 346 mg/dL — ABNORMAL HIGH (ref 70–99)

## 2021-08-21 LAB — FERRITIN: Ferritin: 32 ng/mL (ref 11–307)

## 2021-08-21 MED ORDER — GLUCERNA SHAKE PO LIQD
237.0000 mL | Freq: Three times a day (TID) | ORAL | Status: DC
  Administered 2021-08-21 – 2021-08-22 (×3): 237 mL via ORAL
  Filled 2021-08-21 (×5): qty 237

## 2021-08-21 MED ORDER — LOSARTAN POTASSIUM 50 MG PO TABS
75.0000 mg | ORAL_TABLET | Freq: Every day | ORAL | Status: DC
  Administered 2021-08-21 – 2021-08-22 (×2): 75 mg via ORAL
  Filled 2021-08-21 (×2): qty 1

## 2021-08-21 MED ORDER — HYDRALAZINE HCL 25 MG PO TABS
25.0000 mg | ORAL_TABLET | Freq: Three times a day (TID) | ORAL | Status: DC
  Administered 2021-08-21 – 2021-08-22 (×3): 25 mg via ORAL
  Filled 2021-08-21 (×3): qty 1

## 2021-08-21 MED ORDER — PREDNISONE 20 MG PO TABS
30.0000 mg | ORAL_TABLET | Freq: Every day | ORAL | Status: DC
  Administered 2021-08-22: 30 mg via ORAL
  Filled 2021-08-21: qty 2
  Filled 2021-08-21: qty 1

## 2021-08-21 MED ORDER — FUROSEMIDE 10 MG/ML IJ SOLN
40.0000 mg | Freq: Once | INTRAMUSCULAR | Status: AC
Start: 2021-08-21 — End: 2021-08-21
  Administered 2021-08-21: 40 mg via INTRAVENOUS
  Filled 2021-08-21: qty 4

## 2021-08-21 MED ORDER — INSULIN DETEMIR 100 UNIT/ML ~~LOC~~ SOLN
10.0000 [IU] | Freq: Two times a day (BID) | SUBCUTANEOUS | Status: DC
  Administered 2021-08-21: 10 [IU] via SUBCUTANEOUS
  Filled 2021-08-21 (×2): qty 0.1

## 2021-08-21 MED ORDER — INSULIN DETEMIR 100 UNIT/ML ~~LOC~~ SOLN
12.0000 [IU] | Freq: Two times a day (BID) | SUBCUTANEOUS | Status: DC
  Administered 2021-08-21 – 2021-08-22 (×2): 12 [IU] via SUBCUTANEOUS
  Filled 2021-08-21 (×4): qty 0.12

## 2021-08-21 NOTE — Progress Notes (Signed)
?PROGRESS NOTE ? ? ? Lisa Ibarra  QIO:[id number] DOB: 1960-12-28 DOA: 08/18/2021 ?PCP: Rudene Anda, MD  ? ? ? ?Brief Narrative:  ?H/o  NIDDM2, HTN, asthma, tobacco use ( cutting down to a few cigarette a day), presented with dyspnea, wheezing, hypoxia, she was put on 7 L of oxygen on admission ?+ covid 19 ? ? ?Subjective: ? ?She is frustrated due to persistent elevated blood pressure and blood glucose despite uptitrating meds ? ? reports was in the hospital for asthma in 2014, was sent home on prednisone and had to come back in three days due to being in DKA ? ?Continue to have cough,  no chest pain, denies edema, no fever ? ?Family at bedside  ? ?Assessment & Plan: ? Principal Problem: ?  COVID-19 ?Active Problems: ?  Essential hypertension ?  Asthma exacerbation ?  Diabetes mellitus ([place]) ?  Hypokalemia ?  Thrombocytopenia ([place]) ?  SIRS (systemic inflammatory response syndrome) (HCC) ?  Acute respiratory failure with hypoxia ([place]) ? ? ? ?Assessment and Plan: ? ?Acute hypoxic respiratory failure in the setting of COVID-19 infection and asthma exacerbation ?-CTA no PE, did show bronchitis changes with bilateral bronchial wall thickening, diffuse mosaic attenuation likely due to air trapping and small airway disease ?-COVID-19 infection: Started on Paxlovid and steroid, inflammatory markers trending down ?-Asthma exacerbation:  steroid, nebulizer, received iv mag, on mucinex ?-Improving, taper steroid as able, wean oxygen ? ?Reports increased cough, denies edema, but blood pressure is elevated, will repeat cxr and give one dose of lasix ? ?Hypertension ?Blood pressure remain elevated, continue beta-blocker , increase losartan, add hydralazine ? ?Noninsulin-dependent type 2 diabetes with hyperglycemia ?A1c 6.7 ?Hyperglycemia likely due to stress and steroid ?Expect improvement with taper steroid and recovering from acute illness,  ?Blood glucose remains elevated , increase levemir, continue sliding scale and  meal  coverage ?Continue carb modified diet ? ?Other findings on CTA chest need to follow-up with PCP: ?Aortic atherosclerosis ?Mild atherosclerotic disease of thoracic aorta ,finding on CTA chest ?Follow-up with PCP ? ?Right adrenal gland nodule measuring 1.7 cm with low ?attenuation, compatible with benign adenoma ?Follow-up with PCP ? ?Cigarette smoking ?Smoking cessation education ?Report in the process of cutting down ?Report not able to tolerate nicotine patch in the past ? ?Mild thrombocytopenia, likely due to acute COVID infection, resolved ? ?Erythrocytosis ?Possible related to COVID infection and dehydration ?Could also be from cigarette smoking ,smoking cessation education ?Follow-up with PCP ? ?Hypokalemia/hypomagnesemia ?Replaced, normalized  ? ?: Body mass index is 39.91 kg/m?Marland Kitchen.  Meet obesity criteria ?   ? ? ? ?I have Reviewed nursing notes, Vitals, pain scores, I/o's, Lab results and  imaging results since pt's last encounter, details please see discussion above  ?I ordered the following labs:  ?Unresulted Labs (From admission, onward)  ? ?  Start     Ordered  ? 08/19/21 0500  CBC with Differential/Platelet  Daily,   R     ? 08/18/21 1511  ? 08/19/21 0500  Comprehensive metabolic panel  Daily,   R     ? 08/18/21 1511  ? 08/19/21 0500  C-reactive protein  Daily,   R     ? 08/18/21 1511  ? 08/19/21 0500  D-dimer, quantitative  Daily,   R     ? 08/18/21 1511  ? 08/19/21 0500  Ferritin  Daily,   R     ? 08/18/21 1511  ? 08/19/21 0500  Magnesium  Daily,   R     ?  08/18/21 1511  ? ?  ?  ? ?  ? ? ? ?DVT prophylaxis: SCDs Start: 08/18/21 1512 ? ? ?Code Status:   Code Status: Full Code ? ?Family Communication: patient and family at bedside  ?Disposition:  ? ?Status is: Inpatient ? ?Dispo: The patient is from: Home ?             Anticipated d/c is to: Home ?             Anticipated d/c date is: Wean oxygen, wean steroid,  monitor blood pressure and blood glucose , possible home on 4/2 ? ?Antimicrobials:    ? ?Anti-infectives (From admission, onward)  ? ? Start     Dose/Rate Route Frequency Ordered Stop  ? 08/18/21 1330  nirmatrelvir/ritonavir EUA (PAXLOVID) 3 tablet       ? 3 tablet Oral 2 times daily 08/18/21 1223 08/23/21 0959  ? ?  ? ? ? ? ? ?Objective: ?Vitals:  ? 08/20/21 2032 08/21/21 0257 08/21/21 0452 08/21/21 0500  ?BP: (!) 196/100 (!) 182/83 (!) 194/104 (!) 199/101  ?Pulse: 65 70 67 65  ?Resp: '20 18  18  '$ ?Temp: 97.8 ?F (36.6 ?C)   97.8 ?F (36.6 ?C)  ?TempSrc: Oral   Oral  ?SpO2: 94%   96%  ?Weight:    122.6 kg  ?Height:      ? ? ?Intake/Output Summary (Last 24 hours) at 08/21/2021 0753 ?Last data filed at 08/21/2021 0500 ?Gross per 24 hour  ?Intake 1560 ml  ?Output --  ?Net 1560 ml  ? ?Filed Weights  ? 08/18/21 1600 08/20/21 0557 08/21/21 0500  ?Weight: 120.7 kg 120.5 kg 122.6 kg  ? ? ?Examination: ? ?General exam: alert, awake, communicative,calm, NAD ?Respiratory system:  improved aeration, wheezing appears has resolved , occasional rhonchi, Respiratory effort normal. ?Cardiovascular system:  RRR.  ?Gastrointestinal system: Abdomen is nondistended, soft and nontender.  Normal bowel sounds heard. ?Central nervous system: Alert and oriented. No focal neurological deficits. ?Extremities:  no edema ?Skin: No rashes, lesions or ulcers ?Psychiatry: Judgement and insight appear normal. Mood & affect appropriate.  ? ? ? ?Data Reviewed: I have personally reviewed  labs and visualized  imaging studies since the last encounter and formulate the plan  ? ? ? ? ? ? ?Scheduled Meds: ? vitamin C  500 mg Oral Daily  ? enoxaparin (LOVENOX) injection  60 mg Subcutaneous Q24H  ? furosemide  40 mg Intravenous Once  ? guaiFENesin  600 mg Oral BID  ? insulin aspart  0-15 Units Subcutaneous TID WC  ? insulin aspart  5 Units Subcutaneous TID WC  ? insulin detemir  10 Units Subcutaneous BID  ? Ipratropium-Albuterol  1 puff Inhalation Q6H  ? losartan  75 mg Oral Daily  ? metoprolol succinate  50 mg Oral Daily  ? nirmatrelvir/ritonavir  EUA  3 tablet Oral BID  ? pantoprazole  40 mg Oral Daily  ? predniSONE  40 mg Oral Q breakfast  ? zinc sulfate  220 mg Oral Daily  ? ?Continuous Infusions: ? ? ? ? LOS: 3 days  ? ? ?Florencia Reasons, MD PhD FACP ?[facility] ? ?Available via Epic secure chat 7am-7pm for nonurgent issues ?Please page for urgent issues ?To page the attending provider between 7A-7P or the covering provider during after hours 7P-7A, please log into the web site [email or web address] and access using universal [place] password for that web site. If you do not have the password, please call the hospital operator. ? ? ? ?  08/21/2021, 7:53 AM  ? ? ?

## 2021-08-22 DIAGNOSIS — U071 COVID-19: Secondary | ICD-10-CM | POA: Diagnosis not present

## 2021-08-22 LAB — GLUCOSE, CAPILLARY
Glucose-Capillary: 219 mg/dL — ABNORMAL HIGH (ref 70–99)
Glucose-Capillary: 259 mg/dL — ABNORMAL HIGH (ref 70–99)
Glucose-Capillary: 272 mg/dL — ABNORMAL HIGH (ref 70–99)

## 2021-08-22 LAB — COMPREHENSIVE METABOLIC PANEL
ALT: 18 U/L (ref 0–44)
AST: 16 U/L (ref 15–41)
Albumin: 3.1 g/dL — ABNORMAL LOW (ref 3.5–5.0)
Alkaline Phosphatase: 87 U/L (ref 38–126)
Anion gap: 9 (ref 5–15)
BUN: 18 mg/dL (ref 8–23)
CO2: 32 mmol/L (ref 22–32)
Calcium: 9 mg/dL (ref 8.9–10.3)
Chloride: 96 mmol/L — ABNORMAL LOW (ref 98–111)
Creatinine, Ser: 0.73 mg/dL (ref 0.44–1.00)
GFR, Estimated: >60 mL/min (ref >60)
Glucose, Bld: 230 mg/dL — ABNORMAL HIGH (ref 70–99)
Potassium: 3.3 mmol/L — ABNORMAL LOW (ref 3.5–5.1)
Sodium: 137 mmol/L (ref 135–145)
Total Bilirubin: 0.6 mg/dL (ref 0.3–1.2)
Total Protein: 6.7 g/dL (ref 6.5–8.1)

## 2021-08-22 LAB — CBC WITH DIFFERENTIAL/PLATELET
Abs Immature Granulocytes: 0.18 10*3/uL — ABNORMAL HIGH (ref 0.00–0.07)
Basophils Absolute: 0 10*3/uL (ref 0.0–0.1)
Basophils Relative: 0 %
Eosinophils Absolute: 0 10*3/uL (ref 0.0–0.5)
Eosinophils Relative: 0 %
HCT: 50.6 % — ABNORMAL HIGH (ref 36.0–46.0)
Hemoglobin: 16.7 g/dL — ABNORMAL HIGH (ref 12.0–15.0)
Immature Granulocytes: 2 %
Lymphocytes Relative: 16 %
Lymphs Abs: 1.5 10*3/uL (ref 0.7–4.0)
MCH: 29.1 pg (ref 26.0–34.0)
MCHC: 33 g/dL (ref 30.0–36.0)
MCV: 88.3 fL (ref 80.0–100.0)
Monocytes Absolute: 0.8 10*3/uL (ref 0.1–1.0)
Monocytes Relative: 9 %
Neutro Abs: 6.6 10*3/uL (ref 1.7–7.7)
Neutrophils Relative %: 73 %
Platelets: 148 10*3/uL — ABNORMAL LOW (ref 150–400)
RBC: 5.73 MIL/uL — ABNORMAL HIGH (ref 3.87–5.11)
RDW: 14.3 % (ref 11.5–15.5)
WBC: 9.2 10*3/uL (ref 4.0–10.5)
nRBC: 0 % (ref 0.0–0.2)

## 2021-08-22 LAB — C-REACTIVE PROTEIN: CRP: <0.5 mg/dL (ref <1.0)

## 2021-08-22 LAB — D-DIMER, QUANTITATIVE: D-Dimer, Quant: 1.69 ug/mL-FEU — ABNORMAL HIGH (ref 0.00–0.50)

## 2021-08-22 LAB — FERRITIN: Ferritin: 31 ng/mL (ref 11–307)

## 2021-08-22 LAB — BRAIN NATRIURETIC PEPTIDE: B Natriuretic Peptide: 45.8 pg/mL (ref 0.0–100.0)

## 2021-08-22 LAB — MAGNESIUM: Magnesium: 2.1 mg/dL (ref 1.7–2.4)

## 2021-08-22 MED ORDER — HYDRALAZINE HCL 10 MG PO TABS: 10.0000 mg | ORAL_TABLET | Freq: Three times a day (TID) | ORAL | 0 refills | Status: DC | PRN

## 2021-08-22 MED ORDER — NIRMATRELVIR/RITONAVIR (PAXLOVID)TABLET: 3.0000 | ORAL_TABLET | Freq: Two times a day (BID) | ORAL | 0 refills | Status: AC

## 2021-08-22 MED ORDER — POTASSIUM CHLORIDE 20 MEQ PO PACK
40.0000 meq | PACK | Freq: Once | ORAL | Status: AC
  Administered 2021-08-22: 40 meq via ORAL
  Filled 2021-08-22: qty 2

## 2021-08-22 MED ORDER — INSULIN DETEMIR 100 UNIT/ML FLEXPEN
12.0000 [IU] | PEN_INJECTOR | Freq: Two times a day (BID) | SUBCUTANEOUS | 0 refills | Status: DC
Start: 2021-08-22 — End: 2024-03-23

## 2021-08-22 MED ORDER — ZINC SULFATE 220 (50 ZN) MG PO CAPS: 220.0000 mg | ORAL_CAPSULE | Freq: Every day | ORAL | Status: DC

## 2021-08-22 MED ORDER — METOPROLOL SUCCINATE ER 50 MG PO TB24
50.0000 mg | ORAL_TABLET | Freq: Every day | ORAL | 0 refills | Status: DC
Start: 2021-08-22 — End: 2024-03-24

## 2021-08-22 MED ORDER — LOSARTAN POTASSIUM 50 MG PO TABS: 100.0000 mg | ORAL_TABLET | Freq: Every day | ORAL | Status: DC

## 2021-08-22 MED ORDER — INSULIN PEN NEEDLE 29G X 5MM MISC: 1 refills | Status: AC

## 2021-08-22 MED ORDER — INSULIN ASPART 100 UNIT/ML FLEXPEN
PEN_INJECTOR | SUBCUTANEOUS | 0 refills | Status: DC
Start: 2021-08-22 — End: 2024-03-23

## 2021-08-22 MED ORDER — PREDNISONE 10 MG PO TABS
ORAL_TABLET | ORAL | 0 refills | Status: DC
Start: 2021-08-22 — End: 2024-03-23

## 2021-08-22 MED ORDER — ALBUTEROL SULFATE HFA 108 (90 BASE) MCG/ACT IN AERS
2.0000 | INHALATION_SPRAY | Freq: Four times a day (QID) | RESPIRATORY_TRACT | 3 refills | Status: AC | PRN
Start: 2021-08-22 — End: ?

## 2021-08-22 MED ORDER — ASCORBIC ACID 500 MG PO TABS: 500.0000 mg | ORAL_TABLET | Freq: Every day | ORAL | Status: DC

## 2021-08-22 MED ORDER — BENZONATATE 100 MG PO CAPS: 100.0000 mg | ORAL_CAPSULE | Freq: Four times a day (QID) | ORAL | 0 refills | Status: AC | PRN

## 2021-08-22 MED ORDER — BLOOD GLUCOSE MONITOR KIT: PACK | 0 refills | Status: DC

## 2021-08-22 NOTE — Progress Notes (Signed)
Patient discharged home, discharge instructions given and explained to patient/daughter, they verbalized understanding, patient denies any pain/distress, no pressure injury noted. accompanied home by daughter. ?

## 2021-08-22 NOTE — Progress Notes (Signed)
SATURATION QUALIFICATIONS: (This note is used to comply with regulatory documentation for home oxygen) ? ?Patient Saturations on Room Air at Rest = 93% ? ?Patient Saturations on Room Air while Ambulating = 90% ? ?Patient Saturations on 0 Liters of oxygen while Ambulating = 90% ? ?Please briefly explain why patient needs home oxygen: ?

## 2021-08-22 NOTE — Discharge Summary (Addendum)
, ?Discharge Summary ? ?Lisa Ibarra [id number] DOB: [birth year] ? ?PCP: Rudene Anda, MD ? ?Admit date: 08/18/2021 ?Discharge date: 08/22/2021 ? ?Time spent: 41mns, more than 50% time spent on coordination of care.  ? ?Recommendations for Outpatient Follow-up:  ?F/u with PCP within a week  for hospital discharge follow up, repeat cbc/bmp at follow up ?F/u with pulmonology for dyspnea, hypoxia, home o2 management ?Home 02 ordered, this was cancelled due to she improved and did well on repeat home o2 eval prior to discharge, please see RN home o2 eval note prior to discharge ? ?  ? ?Discharge Diagnoses:  ?Active Hospital Problems  ? Diagnosis Date Noted  ? COVID-19 08/18/2021  ? Hypokalemia 08/18/2021  ? Thrombocytopenia ([facility] 08/18/2021  ? SIRS (systemic inflammatory response syndrome) ([facility] 08/18/2021  ? Acute respiratory failure with hypoxia ([facility] 08/18/2021  ? Asthma exacerbation 05/26/2012  ? Diabetes mellitus ([facility] 05/26/2012  ? Essential hypertension 01/05/2007  ?  ?Resolved Hospital Problems  ?No resolved problems to display.  ? ? ?Discharge Condition: stable ? ?Diet recommendation: heart healthy/carb modified ? ?Filed Weights  ? 08/18/21 1600 08/20/21 0557 08/21/21 0500  ?Weight: 120.7 kg 120.5 kg 122.6 kg  ? ? ?History of present illness: ( per admitting provider Dr KMarylyn Ishihara ?HPI: Lisa STUMPPis a 61y.o. female with medical history significant of HTN, asthma, tobacco abuse, DM2, anxiety. Presenting with dyspnea. Her symptoms began roughly 5 days ago. She had cough and congestion that she couldn't clear. She tried her albuterol, but it didn't help. Her symptoms progressed to include shortness of breath. She didn't have any fever. She believes she had a sick contact at church. When her symptoms didn't improve this morning, she decided to come to the ED for help. She denies any other aggravating or alleviating factors ? ?Hospital Course:  ?Principal Problem: ?  COVID-19 ?Active Problems: ?  Essential  hypertension ?  Asthma exacerbation ?  Diabetes mellitus ([facility] ?  Hypokalemia ?  Thrombocytopenia ([facility] ?  SIRS (systemic inflammatory response syndrome) (HCC) ?  Acute respiratory failure with hypoxia ([facility] [place] ? ? ?Assessment and Plan: ? ?Acute hypoxic respiratory failure in the setting of COVID-19 infection and asthma exacerbation ?-CTA no PE, did show bronchitis changes with bilateral bronchial wall thickening, diffuse mosaic attenuation likely due to air trapping and small airway disease ?-COVID-19 infection: Started on Paxlovid and steroid, inflammatory markers trending down ?-Asthma exacerbation:  steroid, nebulizer, received iv mag, on mucinex ?-Improving, taper steroid as able, wean oxygen ?-she qualified for home o2, order placed  ? ?Mild thrombocytopenia, likely due to acute COVID infection, resolved ?  ?Erythrocytosis ?Possible related to COVID infection and dehydration ?Could also be from cigarette smoking ,smoking cessation education ?Follow-up with PCP ?  ?Hypokalemia/hypomagnesemia ?Replaced, normalized  ? ?  ?  ?Hypertension ?Blood pressure  elevated likely due to stress/steroids ? continue beta-blocker , increase losartan, add  prn hydralazine ?She is advised to check blood pressure at home twice a day bring blood pressure record to PCP, further blood pressure medication adjustment per PCP ?  ?Noninsulin-dependent type 2 diabetes with hyperglycemia ?A1c 6.7 ?Hyperglycemia likely due to stress and steroid ?Expect improvement with taper steroid and recovering from acute illness,  ?Discharged on levemir, continue sliding scale, testing strips/pen needles prescription provided  ?Continue carb modified diet ?  ?Other findings on CTA chest need to follow-up with PCP: ?Aortic atherosclerosis ?Mild atherosclerotic disease of thoracic aorta ,finding on CTA chest ?Follow-up with PCP ?  ?Right adrenal gland  nodule measuring 1.7 cm with low ?attenuation, compatible with benign adenoma ?Follow-up with PCP ?   ?Cigarette smoking ?Smoking cessation education ?Report in the process of cutting down ?Report not able to tolerate nicotine patch in the past ?  ? ?  ?Body mass index is 39.91 kg/m?Marland Kitchen.  Meet obesity criteria ?   ? ? ?Discharge Exam: ?BP (!) 189/68 (BP Location: Right Arm)   Pulse 60   Temp 97.9 ?F (36.6 ?C) (Oral)   Resp 19   Ht '5\' 9"'  (1.753 m)   Wt 122.6 kg   LMP 06/12/2011   SpO2 94%   BMI 39.91 kg/m?  ? ?General: NAD ?Cardiovascular: RRR, no edema ?Respiratory: normal respiratory effort  ? ? ? ?Discharge Instructions   ? ? Diet - low sodium heart healthy   Complete by: As directed ?  ? Carb modified  ? For home use only DME oxygen   Complete by: As directed ?  ? Length of Need: 6 Months  ? Liters per Minute: 2  ? Oxygen conserving device: Yes  ? Oxygen delivery system: Gas  ? Increase activity slowly   Complete by: As directed ?  ? Increase activity slowly   Complete by: As directed ?  ? ?  ? ?Allergies as of 08/22/2021   ? ?   Reactions  ? Motrin [ibuprofen] Nausea And Vomiting  ? Empagliflozin Itching, Rash  ? ?  ? ?  ?Medication List  ?  ? ?STOP taking these medications   ? ?acetaminophen 500 MG tablet ?Commonly known as: TYLENOL ?  ?cyclobenzaprine 10 MG tablet ?Commonly known as: FLEXERIL ?  ?diazepam 2 MG tablet ?Commonly known as: Valium ?  ?meclizine 12.5 MG tablet ?Commonly known as: ANTIVERT ?  ?ondansetron 4 MG tablet ?Commonly known as: ZOFRAN ?  ? ?  ? ?TAKE these medications   ? ?albuterol 108 (90 Base) MCG/ACT inhaler ?Commonly known as: VENTOLIN HFA ?Inhale 2 puffs into the lungs every 6 (six) hours as needed for wheezing or shortness of breath. ?  ?ascorbic acid 500 MG tablet ?Commonly known as: VITAMIN C ?Take 1 tablet (500 mg total) by mouth daily. ?  ?benzonatate 100 MG capsule ?Commonly known as: Best boy ?Take 1 capsule (100 mg total) by mouth every 6 (six) hours as needed for up to 10 days for cough. ?  ?blood glucose meter kit and supplies Kit ?Dispense based on patient and  insurance preference. Use up to four times daily as directed. ?  ?glipiZIDE 10 MG tablet ?Commonly known as: GLUCOTROL ?Take 10 mg by mouth 2 (two) times daily. ?  ?[employer] Extra Strength [id number] MG Pack ?Generic drug: Aspirin-Acetaminophen-Caffeine ?Take 1 packet by mouth daily as needed (pain). ?  ?hydrALAZINE 10 MG tablet ?Commonly known as: APRESOLINE ?Take 1 tablet (10 mg total) by mouth every 8 (eight) hours as needed (for sbp> 170). ?  ?insulin aspart 100 UNIT/ML FlexPen ?Commonly known as: NOVOLOG ?Insulin sliding scale: ?Blood sugar  120-150   3units ?                      151-200   4units ?                      201-250   7units ?                      251- 300  11units ?  301-350   15uints ?                      351-400   20units ?                      >400         call MD immediately ?  ?insulin detemir 100 UNIT/ML FlexPen ?Commonly known as: LEVEMIR ?Inject 12 Units into the skin 2 (two) times daily. Stop using levemir insulin if you am blood glucose less than 80 ?  ?Insulin Pen Needle 29G X 5MM Misc ?For insulin injection ?  ?losartan 50 MG tablet ?Commonly known as: COZAAR ?Take 2 tablets (100 mg total) by mouth daily. ?What changed: how much to take ?  ?metFORMIN 500 MG 24 hr tablet ?Commonly known as: GLUCOPHAGE-XR ?Take 1,000 mg by mouth 2 (two) times daily. ?  ?metoprolol succinate 50 MG 24 hr tablet ?Commonly known as: TOPROL-XL ?Take 1 tablet (50 mg total) by mouth at bedtime. Take with or immediately following a meal. ?What changed: when to take this ?  ?nirmatrelvir/ritonavir EUA 20 x 150 MG & 10 x 100MG Tabs ?Commonly known as: PAXLOVID ?Take 3 tablets by mouth 2 (two) times daily for 1 dose. Patient GFR is 60 ?Take nirmatrelvir (150 mg) two tablets and ritonavir (100 mg) one tablet this evening on 4/2 ?  ?predniSONE 10 MG tablet ?Commonly known as: DELTASONE ?Please take prednisone with breakfast ?Please take 2tabs on 4/3, then take 1tab on 4/4, then stop. ?  ?zinc  sulfate 220 (50 Zn) MG capsule ?Take 1 capsule (220 mg total) by mouth daily. ?  ? ?  ? ?  ?  ? ? ?  ?Durable Medical Equipment  ?(From admission, onward)  ?  ? ? ?  ? ?  Start     Ordered  ? 08/22/21 0849  For home use only DME oxyge

## 2022-02-01 ENCOUNTER — Emergency Department (HOSPITAL_BASED_OUTPATIENT_CLINIC_OR_DEPARTMENT_OTHER)
Admission: EM | Admit: 2022-02-01 | Discharge: 2022-02-01 | Disposition: A | Discharge status: Home / Self Care | Payer: BC Managed Care – PPO | Attending: Emergency Medicine | Admitting: Emergency Medicine

## 2022-02-01 ENCOUNTER — Other Ambulatory Visit: Payer: Self-pay

## 2022-02-01 ENCOUNTER — Emergency Department (HOSPITAL_BASED_OUTPATIENT_CLINIC_OR_DEPARTMENT_OTHER): Payer: BC Managed Care – PPO

## 2022-02-01 ENCOUNTER — Encounter (HOSPITAL_BASED_OUTPATIENT_CLINIC_OR_DEPARTMENT_OTHER): Payer: Self-pay | Admitting: Emergency Medicine

## 2022-02-01 DIAGNOSIS — Z7984 Long term (current) use of oral hypoglycemic drugs: Secondary | ICD-10-CM | POA: Diagnosis not present

## 2022-02-01 DIAGNOSIS — Z794 Long term (current) use of insulin: Secondary | ICD-10-CM | POA: Diagnosis not present

## 2022-02-01 DIAGNOSIS — Z7982 Long term (current) use of aspirin: Secondary | ICD-10-CM | POA: Insufficient documentation

## 2022-02-01 DIAGNOSIS — Z7951 Long term (current) use of inhaled steroids: Secondary | ICD-10-CM | POA: Diagnosis not present

## 2022-02-01 DIAGNOSIS — Z20822 Contact with and (suspected) exposure to covid-19: Secondary | ICD-10-CM | POA: Diagnosis not present

## 2022-02-01 DIAGNOSIS — Z79899 Other long term (current) drug therapy: Secondary | ICD-10-CM | POA: Diagnosis not present

## 2022-02-01 DIAGNOSIS — R0602 Shortness of breath: Secondary | ICD-10-CM | POA: Diagnosis present

## 2022-02-01 DIAGNOSIS — I10 Essential (primary) hypertension: Secondary | ICD-10-CM | POA: Insufficient documentation

## 2022-02-01 DIAGNOSIS — E119 Type 2 diabetes mellitus without complications: Secondary | ICD-10-CM | POA: Insufficient documentation

## 2022-02-01 DIAGNOSIS — J45909 Unspecified asthma, uncomplicated: Secondary | ICD-10-CM | POA: Insufficient documentation

## 2022-02-01 DIAGNOSIS — Z859 Personal history of malignant neoplasm, unspecified: Secondary | ICD-10-CM | POA: Insufficient documentation

## 2022-02-01 DIAGNOSIS — R030 Elevated blood-pressure reading, without diagnosis of hypertension: Secondary | ICD-10-CM

## 2022-02-01 DIAGNOSIS — J441 Chronic obstructive pulmonary disease with (acute) exacerbation: Secondary | ICD-10-CM | POA: Insufficient documentation

## 2022-02-01 HISTORY — DX: Malignant (primary) neoplasm, unspecified: C80.1

## 2022-02-01 HISTORY — DX: Other chronic pain: G89.29

## 2022-02-01 LAB — RESP PANEL BY RT-PCR (FLU A&B, COVID) ARPGX2
Influenza A by PCR: NEGATIVE
Influenza B by PCR: NEGATIVE
SARS Coronavirus 2 by RT PCR: NEGATIVE

## 2022-02-01 MED ORDER — METHYLPREDNISOLONE SODIUM SUCC 125 MG IJ SOLR
125.0000 mg | Freq: Once | INTRAMUSCULAR | Status: AC
  Administered 2022-02-01: 125 mg via INTRAMUSCULAR

## 2022-02-01 MED ORDER — AMOXICILLIN-POT CLAVULANATE 875-125 MG PO TABS: 1.0000 | ORAL_TABLET | Freq: Two times a day (BID) | ORAL | 0 refills | Status: DC

## 2022-02-01 MED ORDER — METHYLPREDNISOLONE SODIUM SUCC 125 MG IJ SOLR
125.0000 mg | Freq: Once | INTRAMUSCULAR | Status: DC
  Filled 2022-02-01: qty 2

## 2022-02-01 NOTE — ED Triage Notes (Signed)
States has been having SOB, cough and fever x 2 weeks . COVID x 5 months ago.

## 2022-02-01 NOTE — ED Provider Notes (Signed)
[facility] EMERGENCY DEPARTMENT Provider Note   CSN: [id number] Arrival date & time: 02/01/22  1012     History  Chief Complaint  Patient presents with   Shortness of Breath   Cough    Lisa Ibarra is a 61 y.o. female.  61 year old female with past medical history significant for asthma, COPD, hypertension, diabetes presents today for evaluation of URI symptoms that have been ongoing for the past 2 weeks.  She is concerned she has COVID.  She was admitted in April for [place].  She denies chest pain.  She reports she feels that she has mucus in her chest that she is unable to bring up.  She denies productive cough, sinus congestion, sore throat, or fever.  The history is provided by the patient. No language interpreter was used.       Home Medications Prior to Admission medications   Medication Sig Start Date End Date Taking? Authorizing Provider  albuterol (VENTOLIN HFA) 108 (90 Base) MCG/ACT inhaler Inhale 2 puffs into the lungs every 6 (six) hours as needed for wheezing or shortness of breath. 08/22/21  Yes Florencia Reasons, MD  Aspirin-Acetaminophen-Caffeine (GOODYS EXTRA STRENGTH) [phone] MG PACK Take 1 packet by mouth daily as needed (pain).   Yes [provider]  losartan (COZAAR) 50 MG tablet Take 2 tablets (100 mg total) by mouth daily. 08/22/21  Yes Florencia Reasons, MD  metFORMIN (GLUCOPHAGE-XR) 500 MG 24 hr tablet Take 1,000 mg by mouth 2 (two) times daily. 06/23/21  Yes [provider]  metoprolol succinate (TOPROL-XL) 50 MG 24 hr tablet Take 1 tablet (50 mg total) by mouth at bedtime. Take with or immediately following a meal. 08/22/21  Yes Florencia Reasons, MD  ascorbic acid (VITAMIN C) 500 MG tablet Take 1 tablet (500 mg total) by mouth daily. 08/22/21   Florencia Reasons, MD  blood glucose meter kit and supplies KIT Dispense based on patient and insurance preference. Use up to four times daily as directed. 08/22/21   Florencia Reasons, MD  glipiZIDE (GLUCOTROL) 10 MG tablet Take 10 mg by  mouth 2 (two) times daily. 07/22/21   [provider]  hydrALAZINE (APRESOLINE) 10 MG tablet Take 1 tablet (10 mg total) by mouth every 8 (eight) hours as needed (for sbp> 170). 08/22/21   Florencia Reasons, MD  insulin aspart (NOVOLOG) 100 UNIT/ML FlexPen Insulin sliding scale: Blood sugar  120-150   3units                       151-200   4units                       201-250   7units                       251- 300  11units                       301-350   15uints                       351-400   20units                       >400         call MD immediately 08/22/21   Florencia Reasons, MD  insulin detemir (LEVEMIR) 100 UNIT/ML FlexPen Inject 12 Units into the skin 2 (  two) times daily. Stop using levemir insulin if you am blood glucose less than 80 08/22/21   Florencia Reasons, MD  Insulin Pen Needle 29G X 5MM MISC For insulin injection 08/22/21   Florencia Reasons, MD  predniSONE (DELTASONE) 10 MG tablet Please take prednisone with breakfast Please take 2tabs on 4/3, then take 1tab on 4/4, then stop. 08/22/21   Florencia Reasons, MD  zinc sulfate 220 (50 Zn) MG capsule Take 1 capsule (220 mg total) by mouth daily. 08/22/21   Florencia Reasons, MD      Allergies    Motrin [ibuprofen] and Empagliflozin    Review of Systems   Review of Systems  Constitutional:  Negative for chills and fever.  HENT:  Negative for congestion, sore throat, trouble swallowing and voice change.   Respiratory:  Positive for cough and shortness of breath. Negative for wheezing.   Cardiovascular:  Negative for chest pain and leg swelling.  Gastrointestinal:  Negative for nausea.  Neurological:  Negative for headaches.  All other systems reviewed and are negative.   Physical Exam Updated Vital Signs BP (!) 165/87 (BP Location: Left Arm)   Pulse 64   Temp 98.5 F (36.9 C) (Oral)   Resp 18   Ht 5' 9.5" (1.765 m)   Wt 115.7 kg   LMP 06/12/2011   SpO2 93%   BMI 37.12 kg/m  Physical Exam Vitals and nursing note reviewed.  Constitutional:      General: She is  not in acute distress.    Appearance: Normal appearance. She is not ill-appearing.  HENT:     Head: Normocephalic and atraumatic.     Nose: Nose normal.  Eyes:     General: No scleral icterus.    Extraocular Movements: Extraocular movements intact.     Conjunctiva/sclera: Conjunctivae normal.  Cardiovascular:     Rate and Rhythm: Normal rate and regular rhythm.     Pulses: Normal pulses.  Pulmonary:     Effort: Pulmonary effort is normal. No respiratory distress.     Breath sounds: Normal breath sounds. No wheezing or rales.  Abdominal:     General: There is no distension.     Tenderness: There is no abdominal tenderness.  Musculoskeletal:        General: Normal range of motion.     Cervical back: Normal range of motion.  Skin:    General: Skin is warm and dry.  Neurological:     General: No focal deficit present.     Mental Status: She is alert. Mental status is at baseline.     ED Results / Procedures / Treatments   Labs (all labs ordered are listed, but only abnormal results are displayed) Labs Reviewed  RESP PANEL BY RT-PCR (FLU A&B, COVID) ARPGX2    EKG None  Radiology DG Chest Portable 1 View  Result Date: 02/01/2022 CLINICAL DATA:  Fever, shortness of breath and cough EXAM: PORTABLE CHEST 1 VIEW COMPARISON:  Chest radiograph dated 08/21/2021 FINDINGS: Normal lung volumes. Linear left lower lung opacities may represent subsegmental atelectasis/scarring or sequela of prior infection. No focal consolidation. No pleural effusion or pneumothorax. Similar cardiomediastinal silhouette. Radiodensity projecting over the aortic arch likely reflects aortic atherosclerosis. The visualized skeletal structures are unremarkable. IMPRESSION: 1. No focal consolidation. Left lower lung linear opacities may reflect atelectasis, scarring, or sequela of prior infection. 2. Radiodensity projecting over the aortic arch likely reflects aortic atherosclerosis. Aortic Atherosclerosis  (ICD10-I70.0). Electronically Signed   By: Shawn Route.D.  On: 02/01/2022 11:05    Procedures Procedures    Medications Ordered in ED Medications - No data to display  ED Course/ Medical Decision Making/ A&P Clinical Course as of 02/01/22 1200  Tue Feb 01, 2022  1115 DG Chest Portable 1 View [AA]    Clinical Course User Index [AA] Evlyn Courier, PA-C                           Medical Decision Making Amount and/or Complexity of Data Reviewed Radiology: ordered. Decision-making details documented in ED Course.   Medical Decision Making / ED Course   This patient presents to the ED for concern of URI symptoms, this involves an extensive number of treatment options, and is a complaint that carries with it a high risk of complications and morbidity.  The differential diagnosis includes COPD exacerbation, asthma exacerbation, pneumonia, viral URI, PE  MDM: 61 year old female presents today for evaluation of cough, shortness of breath.  She is without fever.  Has taken DayQuil and Coricidin over-the-counter without significant relief.  She is concerned she may have COVID.  Chest x-ray without evidence of pneumonia.  COVID and flu negative. I did ambulate patient within the room.  She was able ambulate without drop in saturations.  Discussed course of prednisone and antibiotics to cover for COPD exacerbation.  States she is in financial difficulty currently and needs to get her car repaired and cannot afford to many medications.  She prefers to have an IM shot of steroids in the emergency room and be discharged with p.o. antibiotics.  We will give IM Solu-Medrol and prescribe Augmentin.  Patient is appropriate for discharge.  Discharged in stable condition.  Return precautions discussed.  Discussed follow-up with PCP.  Lab Tests: -I ordered, reviewed, and interpreted labs.   The pertinent results include:   Labs Reviewed  RESP PANEL BY RT-PCR (FLU A&B, COVID) ARPGX2      EKG  EKG  Interpretation  Date/Time:    Ventricular Rate:    PR Interval:    QRS Duration:   QT Interval:    QTC Calculation:   R Axis:     Text Interpretation:           Imaging Studies ordered: I ordered imaging studies including chest x-ray I independently visualized and interpreted imaging. I agree with the radiologist interpretation   Medicines ordered and prescription drug management: No orders of the defined types were placed in this encounter.   -I have reviewed the patients home medicines and have made adjustments as needed  Reevaluation: After the interventions noted above, I reevaluated the patient and found that they have :stayed the same  Co morbidities that complicate the patient evaluation  Past Medical History:  Diagnosis Date   Asthma    Cancer ([place])    Cannabis abuse    Chronic pain    Diabetes mellitus type 2, controlled ([place])    Hypertension       Dispostion: Patient is appropriate for discharge.  Discharged in stable condition.  Patient does have elevated BP.  Discussed BP diary and follow-up with her PCP to have this reevaluated.  Final Clinical Impression(s) / ED Diagnoses Final diagnoses:  COPD exacerbation ([place])  Elevated blood pressure reading    Rx / DC Orders ED Discharge Orders          Ordered    amoxicillin-clavulanate (AUGMENTIN) 875-125 MG tablet  Every 12 hours  02/01/22 1208              Evlyn Courier, PA-C 02/01/22 1220    Elgie Congo, MD 02/01/22 [phone]

## 2022-02-01 NOTE — Discharge Instructions (Addendum)
Your work-up today is reassuring.  X-ray did not show evidence of pneumonia.  COVID and flu was negative.  You got a shot of steroids in the emergency room.  I have sent antibiotic into the pharmacy for you.  For any concerning symptoms return to the emergency room otherwise follow-up with your primary care provider.  Your blood pressure was also elevated today.  Please keep a blood pressure diary and follow-up with your primary care provider to determine if you need adjustment to your blood pressure medications.  If you develop chest pain, shortness of breath, visual change, balance issues return to the emergency room for evaluation.

## 2022-02-23 ENCOUNTER — Emergency Department (HOSPITAL_BASED_OUTPATIENT_CLINIC_OR_DEPARTMENT_OTHER): Payer: BC Managed Care – PPO

## 2022-02-23 ENCOUNTER — Emergency Department (HOSPITAL_BASED_OUTPATIENT_CLINIC_OR_DEPARTMENT_OTHER)
Admission: EM | Admit: 2022-02-23 | Discharge: 2022-02-23 | Disposition: A | Discharge status: Home / Self Care | Payer: BC Managed Care – PPO | Attending: Emergency Medicine | Admitting: Emergency Medicine

## 2022-02-23 ENCOUNTER — Other Ambulatory Visit: Payer: Self-pay

## 2022-02-23 ENCOUNTER — Encounter (HOSPITAL_BASED_OUTPATIENT_CLINIC_OR_DEPARTMENT_OTHER): Payer: Self-pay

## 2022-02-23 DIAGNOSIS — Z794 Long term (current) use of insulin: Secondary | ICD-10-CM | POA: Insufficient documentation

## 2022-02-23 DIAGNOSIS — I159 Secondary hypertension, unspecified: Secondary | ICD-10-CM | POA: Insufficient documentation

## 2022-02-23 DIAGNOSIS — R0602 Shortness of breath: Secondary | ICD-10-CM | POA: Diagnosis not present

## 2022-02-23 DIAGNOSIS — Z7984 Long term (current) use of oral hypoglycemic drugs: Secondary | ICD-10-CM | POA: Diagnosis not present

## 2022-02-23 DIAGNOSIS — R059 Cough, unspecified: Secondary | ICD-10-CM | POA: Diagnosis not present

## 2022-02-23 DIAGNOSIS — Z79899 Other long term (current) drug therapy: Secondary | ICD-10-CM | POA: Diagnosis not present

## 2022-02-23 DIAGNOSIS — R42 Dizziness and giddiness: Secondary | ICD-10-CM | POA: Insufficient documentation

## 2022-02-23 DIAGNOSIS — Z20822 Contact with and (suspected) exposure to covid-19: Secondary | ICD-10-CM | POA: Diagnosis not present

## 2022-02-23 DIAGNOSIS — R519 Headache, unspecified: Secondary | ICD-10-CM | POA: Diagnosis present

## 2022-02-23 LAB — CBC WITH DIFFERENTIAL/PLATELET
Abs Immature Granulocytes: 0.01 10*3/uL (ref 0.00–0.07)
Basophils Absolute: 0 10*3/uL (ref 0.0–0.1)
Basophils Relative: 1 %
Eosinophils Absolute: 0.3 10*3/uL (ref 0.0–0.5)
Eosinophils Relative: 5 %
HCT: 47.6 % — ABNORMAL HIGH (ref 36.0–46.0)
Hemoglobin: 16 g/dL — ABNORMAL HIGH (ref 12.0–15.0)
Immature Granulocytes: 0 %
Lymphocytes Relative: 37 %
Lymphs Abs: 2.2 10*3/uL (ref 0.7–4.0)
MCH: 31.1 pg (ref 26.0–34.0)
MCHC: 33.6 g/dL (ref 30.0–36.0)
MCV: 92.6 fL (ref 80.0–100.0)
Monocytes Absolute: 0.6 10*3/uL (ref 0.1–1.0)
Monocytes Relative: 10 %
Neutro Abs: 2.8 10*3/uL (ref 1.7–7.7)
Neutrophils Relative %: 47 %
Platelets: 210 10*3/uL (ref 150–400)
RBC: 5.14 MIL/uL — ABNORMAL HIGH (ref 3.87–5.11)
RDW: 13.6 % (ref 11.5–15.5)
WBC: 5.9 10*3/uL (ref 4.0–10.5)
nRBC: 0 % (ref 0.0–0.2)

## 2022-02-23 LAB — COMPREHENSIVE METABOLIC PANEL
ALT: 11 U/L (ref 0–44)
AST: 13 U/L — ABNORMAL LOW (ref 15–41)
Albumin: 3.4 g/dL — ABNORMAL LOW (ref 3.5–5.0)
Alkaline Phosphatase: 73 U/L (ref 38–126)
Anion gap: 9 (ref 5–15)
BUN: 19 mg/dL (ref 8–23)
CO2: 21 mmol/L — ABNORMAL LOW (ref 22–32)
Calcium: 9.1 mg/dL (ref 8.9–10.3)
Chloride: 109 mmol/L (ref 98–111)
Creatinine, Ser: 0.64 mg/dL (ref 0.44–1.00)
GFR, Estimated: >60 mL/min (ref >60)
Glucose, Bld: 93 mg/dL (ref 70–99)
Potassium: 3.5 mmol/L (ref 3.5–5.1)
Sodium: 139 mmol/L (ref 135–145)
Total Bilirubin: 0.6 mg/dL (ref 0.3–1.2)
Total Protein: 7.4 g/dL (ref 6.5–8.1)

## 2022-02-23 LAB — BRAIN NATRIURETIC PEPTIDE: B Natriuretic Peptide: 44.4 pg/mL (ref 0.0–100.0)

## 2022-02-23 LAB — TROPONIN I (HIGH SENSITIVITY): Troponin I (High Sensitivity): 3 ng/L (ref <18)

## 2022-02-23 LAB — SARS CORONAVIRUS 2 BY RT PCR: SARS Coronavirus 2 by RT PCR: NEGATIVE

## 2022-02-23 LAB — CBG MONITORING, ED: Glucose-Capillary: 98 mg/dL (ref 70–99)

## 2022-02-23 MED ORDER — DIPHENHYDRAMINE HCL 50 MG/ML IJ SOLN
12.5000 mg | Freq: Once | INTRAMUSCULAR | Status: AC
  Administered 2022-02-23: 12.5 mg via INTRAVENOUS
  Filled 2022-02-23: qty 1

## 2022-02-23 MED ORDER — AMLODIPINE BESYLATE 5 MG PO TABS: 5.0000 mg | ORAL_TABLET | Freq: Every day | ORAL | 0 refills | Status: DC

## 2022-02-23 MED ORDER — PROCHLORPERAZINE EDISYLATE 10 MG/2ML IJ SOLN
10.0000 mg | Freq: Once | INTRAMUSCULAR | Status: AC
  Administered 2022-02-23: 10 mg via INTRAVENOUS
  Filled 2022-02-23: qty 2

## 2022-02-23 NOTE — Discharge Instructions (Addendum)
Start amlodipine to help with blood pressure.  Follow-up with your primary care doctor.

## 2022-02-23 NOTE — ED Notes (Signed)
Discharge instructions reviewed with patient. Patient verbalizes understanding, no further questions at this time. Medications/prescriptions and follow up information provided. No acute distress noted at time of departure.  

## 2022-02-23 NOTE — ED Provider Notes (Signed)
[facility] EMERGENCY DEPARTMENT Provider Note   CSN: [id number] Arrival date & time: 02/23/22  1132     History  Chief Complaint  Patient presents with   Shortness of Breath   Dizziness    Lisa Ibarra is a 61 y.o. female.  Patient here with headache, high blood pressure, cough, shortness of breath.  Recently diagnosed with bronchitis.  Blood pressure has been difficult to control for several months.  She states headache and blood pressure seems to coincide.  She denies any trauma.  Denies any weakness, numbness, vision changes.  Nothing makes it worse or better.  Denies any abdominal pain, nausea, vomiting.  The history is provided by the patient.       Home Medications Prior to Admission medications   Medication Sig Start Date End Date Taking? Authorizing Provider  amLODipine (NORVASC) 5 MG tablet Take 1 tablet (5 mg total) by mouth daily. 02/23/22 03/25/22 Yes Darrill Vreeland, DO  albuterol (VENTOLIN HFA) 108 (90 Base) MCG/ACT inhaler Inhale 2 puffs into the lungs every 6 (six) hours as needed for wheezing or shortness of breath. 08/22/21   Florencia Reasons, MD  amoxicillin-clavulanate (AUGMENTIN) 875-125 MG tablet Take 1 tablet by mouth every 12 (twelve) hours. 02/01/22   Evlyn Courier, PA-C  ascorbic acid (VITAMIN C) 500 MG tablet Take 1 tablet (500 mg total) by mouth daily. 08/22/21   Florencia Reasons, MD  Aspirin-Acetaminophen-Caffeine (GOODYS EXTRA STRENGTH) [phone] MG PACK Take 1 packet by mouth daily as needed (pain).    [provider]  blood glucose meter kit and supplies KIT Dispense based on patient and insurance preference. Use up to four times daily as directed. 08/22/21   Florencia Reasons, MD  glipiZIDE (GLUCOTROL) 10 MG tablet Take 10 mg by mouth 2 (two) times daily. 07/22/21   [provider]  hydrALAZINE (APRESOLINE) 10 MG tablet Take 1 tablet (10 mg total) by mouth every 8 (eight) hours as needed (for sbp> 170). 08/22/21   Florencia Reasons, MD  insulin aspart (NOVOLOG) 100  UNIT/ML FlexPen Insulin sliding scale: Blood sugar  120-150   3units                       151-200   4units                       201-250   7units                       251- 300  11units                       301-350   15uints                       351-400   20units                       >400         call MD immediately 08/22/21   Florencia Reasons, MD  insulin detemir (LEVEMIR) 100 UNIT/ML FlexPen Inject 12 Units into the skin 2 (two) times daily. Stop using levemir insulin if you am blood glucose less than 80 08/22/21   Florencia Reasons, MD  Insulin Pen Needle 29G X 5MM MISC For insulin injection 08/22/21   Florencia Reasons, MD  losartan (COZAAR) 50 MG tablet Take 2 tablets (100 mg total) by mouth daily. 08/22/21  Florencia Reasons, MD  metFORMIN (GLUCOPHAGE-XR) 500 MG 24 hr tablet Take 1,000 mg by mouth 2 (two) times daily. 06/23/21   [provider]  metoprolol succinate (TOPROL-XL) 50 MG 24 hr tablet Take 1 tablet (50 mg total) by mouth at bedtime. Take with or immediately following a meal. 08/22/21   Florencia Reasons, MD  predniSONE (DELTASONE) 10 MG tablet Please take prednisone with breakfast Please take 2tabs on 4/3, then take 1tab on 4/4, then stop. 08/22/21   Florencia Reasons, MD  zinc sulfate 220 (50 Zn) MG capsule Take 1 capsule (220 mg total) by mouth daily. 08/22/21   Florencia Reasons, MD      Allergies    Motrin [ibuprofen] and Empagliflozin    Review of Systems   Review of Systems  Physical Exam Updated Vital Signs BP (!) 169/86   Pulse 65   Temp 98.1 F (36.7 C) (Oral)   Resp 16   Ht 5' 9.5" (1.765 m)   Wt 111.6 kg   LMP 06/12/2011   SpO2 92%   BMI 35.81 kg/m  Physical Exam Vitals and nursing note reviewed.  Constitutional:      General: She is not in acute distress.    Appearance: She is well-developed. She is not ill-appearing.  HENT:     Head: Normocephalic and atraumatic.  Eyes:     Extraocular Movements: Extraocular movements intact.     Conjunctiva/sclera: Conjunctivae normal.     Pupils: Pupils are equal,  round, and reactive to light.  Cardiovascular:     Rate and Rhythm: Normal rate and regular rhythm.     Pulses: Normal pulses.     Heart sounds: Normal heart sounds. No murmur heard. Pulmonary:     Effort: Pulmonary effort is normal. No respiratory distress.     Breath sounds: Normal breath sounds. No decreased breath sounds, wheezing or rhonchi.  Abdominal:     Palpations: Abdomen is soft.     Tenderness: There is no abdominal tenderness.  Musculoskeletal:        General: No swelling.     Cervical back: Normal range of motion and neck supple.     Comments: Trace edema bilaterally  Skin:    General: Skin is warm and dry.     Capillary Refill: Capillary refill takes less than 2 seconds.  Neurological:     General: No focal deficit present.     Mental Status: She is alert.     Comments: 5+ out of 5 strength throughout, normal sensation, no drift, normal finger-nose-finger, normal speech, normal visual fields  Psychiatric:        Mood and Affect: Mood normal.     ED Results / Procedures / Treatments   Labs (all labs ordered are listed, but only abnormal results are displayed) Labs Reviewed  CBC WITH DIFFERENTIAL/PLATELET - Abnormal; Notable for the following components:      Result Value   RBC 5.14 (*)    Hemoglobin 16.0 (*)    HCT 47.6 (*)    All other components within normal limits  COMPREHENSIVE METABOLIC PANEL - Abnormal; Notable for the following components:   CO2 21 (*)    Albumin 3.4 (*)    AST 13 (*)    All other components within normal limits  SARS CORONAVIRUS 2 BY RT PCR  BRAIN NATRIURETIC PEPTIDE  CBG MONITORING, ED  TROPONIN I (HIGH SENSITIVITY)    EKG EKG Interpretation  Date/Time:  Wednesday February 23 2022 11:43:31 EDT Ventricular Rate:  68  PR Interval:  166 QRS Duration: 90 QT Interval:  410 QTC Calculation: 435 R Axis:   -52 Text Interpretation: Normal sinus rhythm Left anterior fascicular block When compared with ECG of 18-Aug-2021 09:31,  PREVIOUS ECG IS PRESENT Confirmed by Lennice Sites ([id number]) on 02/23/2022 12:09:52 PM  Radiology CT Head Wo Contrast  Result Date: 02/23/2022 CLINICAL DATA:  Hypertension and dizziness for several weeks, headache EXAM: CT HEAD WITHOUT CONTRAST TECHNIQUE: Contiguous axial images were obtained from the base of the skull through the vertex without intravenous contrast. RADIATION DOSE REDUCTION: This exam was performed according to the departmental dose-optimization program which includes automated exposure control, adjustment of the mA and/or kV according to patient size and/or use of iterative reconstruction technique. COMPARISON:  CT head 08/31/2019 FINDINGS: Brain: There is no acute intracranial hemorrhage, extra-axial fluid collection, or acute infarct. Parenchymal volume is normal. The ventricles are normal in size. Gray-white differentiation is preserved. There is no mass lesion.  There is no mass effect or midline shift. Vascular: No hyperdense vessel or unexpected calcification. Skull: Normal. Negative for fracture or focal lesion. Sinuses/Orbits: The imaged paranasal sinuses are clear. The globes and orbits are not imaged. Other: None. IMPRESSION: No acute intracranial pathology. Electronically Signed   By: Valetta Mole M.D.   On: 02/23/2022 12:25   DG Chest Portable 1 View  Result Date: 02/23/2022 CLINICAL DATA:  Shortness of breath and headache. EXAM: PORTABLE CHEST 1 VIEW COMPARISON:  02/01/2022 FINDINGS: The cardiac silhouette, mediastinal and hilar contours are within normal limits and stable. The lungs are clear of process. No pleural effusions. No pneumothorax. The bony thorax is intact. IMPRESSION: No acute cardiopulmonary findings. Electronically Signed   By: Marijo Sanes M.D.   On: 02/23/2022 12:25    Procedures Procedures    Medications Ordered in ED Medications  prochlorperazine (COMPAZINE) injection 10 mg (10 mg Intravenous Given 02/23/22 1309)  diphenhydrAMINE (BENADRYL) injection  12.5 mg (12.5 mg Intravenous Given 02/23/22 1310)    ED Course/ Medical Decision Making/ A&P                           Medical Decision Making Amount and/or Complexity of Data Reviewed Labs: ordered. Radiology: ordered.  Risk Prescription drug management.   THOMASENIA DOWSE is here with primarily headache, high blood pressure.  She has been having some shortness of breath at times.  History of hypertension, diabetes, asthma, chronic pain.  Differential diagnosis is likely migraine process.  On my evaluation blood pressure is 154/80.  She has a normal neurological exam.  I have much lower suspicion for ACS or volume overload or infectious process.  I have no concern for meningitis or stroke.  We will get a CBC, CMP, troponin, BNP, head CT, chest x-ray.  She has clear breath sounds.  I will have any concern for ongoing bronchitis or COPD.  Primarily her concern is migraine headache and will give IV Compazine and IV Benadryl and reevaluate.  EKG shows sinus rhythm.  No ischemic changes.  I reviewed and interpreted EKG.  Per my review and interpretation the labs is no significant anemia, electrolyte abnormality, kidney injury or leukocytosis.  Troponin is normal.  BNP is normal.  CT scan of the head without any acute findings per my review and interpretation.  Radiology report with the same.  Chest x-ray with no evidence of pneumonia or pneumothorax.  Overall feeling better after headache cocktail.  We will add amlodipine to help  with her blood pressure.  We will have her follow-up with primary care doctor.  This chart was dictated using voice recognition software.  Despite best efforts to proofread,  errors can occur which can change the documentation meaning.         Final Clinical Impression(s) / ED Diagnoses Final diagnoses:  Nonintractable headache, unspecified chronicity pattern, unspecified headache type  Secondary hypertension    Rx / DC Orders ED Discharge Orders           Ordered    amLODipine (NORVASC) 5 MG tablet  Daily        02/23/22 [place], Quita Skye, DO 02/23/22 1352

## 2022-02-23 NOTE — ED Triage Notes (Signed)
C/o SOB, cough, hypertension and dizziness for several weeks. Seen at [facility] sent here to be evaluated. C/o severe headache, leg swelling. Diagnosed with bronchitis 2 weeks ago

## 2022-07-25 ENCOUNTER — Emergency Department (HOSPITAL_BASED_OUTPATIENT_CLINIC_OR_DEPARTMENT_OTHER): Payer: BC Managed Care – PPO

## 2022-07-25 ENCOUNTER — Other Ambulatory Visit: Payer: Self-pay

## 2022-07-25 ENCOUNTER — Emergency Department (HOSPITAL_BASED_OUTPATIENT_CLINIC_OR_DEPARTMENT_OTHER)
Admission: EM | Admit: 2022-07-25 | Discharge: 2022-07-25 | Disposition: A | Discharge status: Home / Self Care | Payer: BC Managed Care – PPO | Attending: Emergency Medicine | Admitting: Emergency Medicine

## 2022-07-25 ENCOUNTER — Encounter (HOSPITAL_BASED_OUTPATIENT_CLINIC_OR_DEPARTMENT_OTHER): Payer: Self-pay | Admitting: Urology

## 2022-07-25 DIAGNOSIS — Z1152 Encounter for screening for COVID-19: Secondary | ICD-10-CM | POA: Insufficient documentation

## 2022-07-25 DIAGNOSIS — Z79899 Other long term (current) drug therapy: Secondary | ICD-10-CM | POA: Diagnosis not present

## 2022-07-25 DIAGNOSIS — Z7984 Long term (current) use of oral hypoglycemic drugs: Secondary | ICD-10-CM | POA: Diagnosis not present

## 2022-07-25 DIAGNOSIS — Z87891 Personal history of nicotine dependence: Secondary | ICD-10-CM | POA: Insufficient documentation

## 2022-07-25 DIAGNOSIS — Z8541 Personal history of malignant neoplasm of cervix uteri: Secondary | ICD-10-CM | POA: Insufficient documentation

## 2022-07-25 DIAGNOSIS — Z7982 Long term (current) use of aspirin: Secondary | ICD-10-CM | POA: Insufficient documentation

## 2022-07-25 DIAGNOSIS — E119 Type 2 diabetes mellitus without complications: Secondary | ICD-10-CM | POA: Insufficient documentation

## 2022-07-25 DIAGNOSIS — I10 Essential (primary) hypertension: Secondary | ICD-10-CM | POA: Diagnosis not present

## 2022-07-25 DIAGNOSIS — Z794 Long term (current) use of insulin: Secondary | ICD-10-CM | POA: Insufficient documentation

## 2022-07-25 DIAGNOSIS — J45909 Unspecified asthma, uncomplicated: Secondary | ICD-10-CM | POA: Insufficient documentation

## 2022-07-25 DIAGNOSIS — R059 Cough, unspecified: Secondary | ICD-10-CM | POA: Diagnosis not present

## 2022-07-25 LAB — COMPREHENSIVE METABOLIC PANEL
ALT: 10 U/L (ref 0–44)
AST: 14 U/L — ABNORMAL LOW (ref 15–41)
Albumin: 3.4 g/dL — ABNORMAL LOW (ref 3.5–5.0)
Alkaline Phosphatase: 77 U/L (ref 38–126)
Anion gap: 5 (ref 5–15)
BUN: 10 mg/dL (ref 8–23)
CO2: 26 mmol/L (ref 22–32)
Calcium: 8.7 mg/dL — ABNORMAL LOW (ref 8.9–10.3)
Chloride: 104 mmol/L (ref 98–111)
Creatinine, Ser: 0.58 mg/dL (ref 0.44–1.00)
GFR, Estimated: >60 mL/min (ref >60)
Glucose, Bld: 90 mg/dL (ref 70–99)
Potassium: 3.6 mmol/L (ref 3.5–5.1)
Sodium: 135 mmol/L (ref 135–145)
Total Bilirubin: 0.7 mg/dL (ref 0.3–1.2)
Total Protein: 7.1 g/dL (ref 6.5–8.1)

## 2022-07-25 LAB — CBC
HCT: 50.2 % — ABNORMAL HIGH (ref 36.0–46.0)
Hemoglobin: 16.9 g/dL — ABNORMAL HIGH (ref 12.0–15.0)
MCH: 31.6 pg (ref 26.0–34.0)
MCHC: 33.7 g/dL (ref 30.0–36.0)
MCV: 93.8 fL (ref 80.0–100.0)
Platelets: 172 10*3/uL (ref 150–400)
RBC: 5.35 MIL/uL — ABNORMAL HIGH (ref 3.87–5.11)
RDW: 14.4 % (ref 11.5–15.5)
WBC: 4.7 10*3/uL (ref 4.0–10.5)
nRBC: 0 % (ref 0.0–0.2)

## 2022-07-25 LAB — RESP PANEL BY RT-PCR (RSV, FLU A&B, COVID)  RVPGX2
Influenza A by PCR: NEGATIVE
Influenza B by PCR: NEGATIVE
Resp Syncytial Virus by PCR: NEGATIVE
SARS Coronavirus 2 by RT PCR: NEGATIVE

## 2022-07-25 LAB — BRAIN NATRIURETIC PEPTIDE: B Natriuretic Peptide: 21.5 pg/mL (ref 0.0–100.0)

## 2022-07-25 LAB — TROPONIN I (HIGH SENSITIVITY): Troponin I (High Sensitivity): 3 ng/L (ref <18)

## 2022-07-25 MED ORDER — IPRATROPIUM BROMIDE 0.02 % IN SOLN
0.5000 mg | Freq: Once | RESPIRATORY_TRACT | Status: AC
  Administered 2022-07-25: 0.5 mg via RESPIRATORY_TRACT
  Filled 2022-07-25: qty 2.5

## 2022-07-25 MED ORDER — ALBUTEROL (5 MG/ML) CONTINUOUS INHALATION SOLN: 10.0000 mg/h | INHALATION_SOLUTION | Freq: Once | RESPIRATORY_TRACT | Status: DC

## 2022-07-25 MED ORDER — ALBUTEROL SULFATE (2.5 MG/3ML) 0.083% IN NEBU
INHALATION_SOLUTION | RESPIRATORY_TRACT | Status: AC
  Administered 2022-07-25: 10 mg
  Filled 2022-07-25: qty 12

## 2022-07-25 MED ORDER — METHYLPREDNISOLONE SODIUM SUCC 125 MG IJ SOLR
125.0000 mg | Freq: Once | INTRAMUSCULAR | Status: AC
  Administered 2022-07-25: 125 mg via INTRAVENOUS
  Filled 2022-07-25: qty 2

## 2022-07-25 MED ORDER — MAGNESIUM SULFATE 2 GM/50ML IV SOLN
2.0000 g | Freq: Once | INTRAVENOUS | Status: AC
  Administered 2022-07-25: 2 g via INTRAVENOUS
  Filled 2022-07-25: qty 50

## 2022-07-25 MED ORDER — DOXYCYCLINE HYCLATE 100 MG PO CAPS: 100.0000 mg | ORAL_CAPSULE | Freq: Two times a day (BID) | ORAL | 0 refills | Status: DC

## 2022-07-25 NOTE — ED Provider Notes (Signed)
[name]   CSN: [id number] Arrival date & time: 07/25/22  1008     History  Chief Complaint  Patient presents with   Cough   HPI Lisa Ibarra is a 62 y.o. female with hypertension, diabetes, asthma, tobacco and cannabis abuse and history of cervical cancer presenting for cough.  Started 3 weeks ago.  States she has had a productive cough, congestion headache fatigue and shortness of breath.  About a week ago she developed chest pain.  Located in the left side of her chest that is worse with coughing.  At times radiates to her left jaw and back.  States that it is nonexertional.  Feels sharp.  Shortness of breath is worse with lying flat.  States that she smokes 7 black and milds a day.  States that one of her doctors are recently mentioned that she may have the beginnings of what could be COPD.  Denies calf tenderness, OCP use and recent immobilization.   Cough      Home Medications Prior to Admission medications   Medication Sig Start Date End Date Taking? Authorizing Provider  doxycycline (VIBRAMYCIN) 100 MG capsule Take 1 capsule (100 mg total) by mouth 2 (two) times daily. 07/25/22  Yes Harriet Pho, PA-C  albuterol (VENTOLIN HFA) 108 (90 Base) MCG/ACT inhaler Inhale 2 puffs into the lungs every 6 (six) hours as needed for wheezing or shortness of breath. 08/22/21   Florencia Reasons, MD  amLODipine (NORVASC) 5 MG tablet Take 1 tablet (5 mg total) by mouth daily. 02/23/22 03/25/22  Curatolo, Adam, DO  amoxicillin-clavulanate (AUGMENTIN) 875-125 MG tablet Take 1 tablet by mouth every 12 (twelve) hours. 02/01/22   Evlyn Courier, PA-C  ascorbic acid (VITAMIN C) 500 MG tablet Take 1 tablet (500 mg total) by mouth daily. 08/22/21   Florencia Reasons, MD  Aspirin-Acetaminophen-Caffeine (GOODYS EXTRA STRENGTH) [phone] MG PACK Take 1 packet by mouth daily as needed (pain).    [provider]  blood glucose meter kit and supplies KIT  Dispense based on patient and insurance preference. Use up to four times daily as directed. 08/22/21   Florencia Reasons, MD  glipiZIDE (GLUCOTROL) 10 MG tablet Take 10 mg by mouth 2 (two) times daily. 07/22/21   [provider]  hydrALAZINE (APRESOLINE) 10 MG tablet Take 1 tablet (10 mg total) by mouth every 8 (eight) hours as needed (for sbp> 170). 08/22/21   Florencia Reasons, MD  insulin aspart (NOVOLOG) 100 UNIT/ML FlexPen Insulin sliding scale: Blood sugar  120-150   3units                       151-200   4units                       201-250   7units                       251- 300  11units                       301-350   15uints                       351-400   20units                       >400  call MD immediately 08/22/21   Florencia Reasons, MD  insulin detemir (LEVEMIR) 100 UNIT/ML FlexPen Inject 12 Units into the skin 2 (two) times daily. Stop using levemir insulin if you am blood glucose less than 80 08/22/21   Florencia Reasons, MD  Insulin Pen Needle 29G X 5MM MISC For insulin injection 08/22/21   Florencia Reasons, MD  losartan (COZAAR) 50 MG tablet Take 2 tablets (100 mg total) by mouth daily. 08/22/21   Florencia Reasons, MD  metFORMIN (GLUCOPHAGE-XR) 500 MG 24 hr tablet Take 1,000 mg by mouth 2 (two) times daily. 06/23/21   [provider]  metoprolol succinate (TOPROL-XL) 50 MG 24 hr tablet Take 1 tablet (50 mg total) by mouth at bedtime. Take with or immediately following a meal. 08/22/21   Florencia Reasons, MD  predniSONE (DELTASONE) 10 MG tablet Please take prednisone with breakfast Please take 2tabs on 4/3, then take 1tab on 4/4, then stop. 08/22/21   Florencia Reasons, MD  zinc sulfate 220 (50 Zn) MG capsule Take 1 capsule (220 mg total) by mouth daily. 08/22/21   Florencia Reasons, MD      Allergies    Motrin [ibuprofen] and Empagliflozin    Review of Systems   Review of Systems  Respiratory:  Positive for cough.     Physical Exam   Vitals:   07/25/22 1400 07/25/22 1430  BP: (!) 158/80 (!) 136/92  Pulse: 68 79  Resp: 18 18  Temp:   98.2 F (36.8 C)  SpO2: 96% 95%    CONSTITUTIONAL:  well-appearing, NAD NEURO:  Alert and oriented x 3, CN 3-12 grossly intact EYES:  eyes equal and reactive ENT/NECK:  Supple, no stridor  CARDIO:  regular rate and rhythm, appears well-perfused  PULM:  No respiratory distress, diffuse expiratory wheezing and rhonchi but no rales GI/GU:  non-distended, soft MSK/SPINE:  No gross deformities, no edema, moves all extremities  SKIN:  no rash, atraumatic   *Additional and/or pertinent findings included in MDM below   ED Results / Procedures / Treatments   Labs (all labs ordered are listed, but only abnormal results are displayed) Labs Reviewed  COMPREHENSIVE METABOLIC PANEL - Abnormal; Notable for the following components:      Result Value   Calcium 8.7 (*)    Albumin 3.4 (*)    AST 14 (*)    All other components within normal limits  CBC - Abnormal; Notable for the following components:   RBC 5.35 (*)    Hemoglobin 16.9 (*)    HCT 50.2 (*)    All other components within normal limits  RESP PANEL BY RT-PCR (RSV, FLU A&B, COVID)  [id number]  BRAIN NATRIURETIC PEPTIDE  TROPONIN I (HIGH SENSITIVITY)  TROPONIN I (HIGH SENSITIVITY)    EKG EKG Interpretation  Date/Time:  Monday July 25 2022 13:42:12 EST Ventricular Rate:  67 PR Interval:  199 QRS Duration: 97 QT Interval:  421 QTC Calculation: 445 R Axis:   -29 Text Interpretation: Sinus rhythm Borderline left axis deviation Anteroseptal infarct, age indeterminate No significant change since last tracing Confirmed by Leanord Asal ([id number]) on 07/25/2022 2:06:49 PM  Radiology DG Chest 2 View  Result Date: 07/25/2022 CLINICAL DATA:  Cough EXAM: CHEST - 2 VIEW COMPARISON:  Radiograph 02/23/2022 FINDINGS: The heart size and mediastinal contours are within normal limits.No focal airspace disease. No pleural effusion or pneumothorax.No acute osseous abnormality. Thoracic spondylosis. IMPRESSION: No evidence of acute cardiopulmonary  disease. Electronically Signed   By: Ileene Patrick.D.  On: 07/25/2022 11:00    Procedures Procedures    Medications Ordered in ED Medications  albuterol (PROVENTIL,VENTOLIN) solution continuous neb (10 mg/hr Nebulization Not Given 07/25/22 1334)  ipratropium (ATROVENT) nebulizer solution 0.5 mg (0.5 mg Nebulization Given 07/25/22 1333)  methylPREDNISolone sodium succinate (SOLU-MEDROL) 125 mg/2 mL injection 125 mg (125 mg Intravenous Given 07/25/22 1325)  magnesium sulfate IVPB 2 g 50 mL (0 g Intravenous Stopped 07/25/22 1426)  albuterol (PROVENTIL) (2.5 MG/3ML) 0.083% nebulizer solution (10 mg  Given 07/25/22 1333)    ED Course/ Medical Decision Making/ A&P                             Medical Decision Making Amount and/or Complexity of Data Reviewed Labs: ordered. Radiology: ordered.  Risk Prescription drug management.   Initial Impression and Ddx 62 year old female who is well-appearing and hemodynamically stable presenting for cough and shortness of breath.  Exam notable for wheezing and rhonchi.  DDx includes pneumonia, COPD vs asthma exacerbation, CHF, ACS and PE. Patient PMH that increases complexity of ED encounter: hypertension, diabetes, asthma, tobacco and cannabis abuse and history of cervical cancer  Interpretation of Diagnostics I independent reviewed and interpreted the labs as followed: No acute derangement  - I independently visualized the following imaging with scope of interpretation limited to determining acute life threatening conditions related to emergency care: cxr, which revealed no acute cardiopulmonary process  -I personally reviewed and interpreted EKG which revealed sinus rhythm  Patient Reassessment and Ultimate Disposition/Management Symptoms consistent with likely COPD versus asthma exacerbation.  Treated with continuous albuterol, ipratropium, IV Solu-Medrol, and IV magnesium.  Patient stated she felt better after treatment.  Sent doxycycline to her  pharmacy to treat empirically for infection given that her cough is gotten worse with worsening sputum production.  Low suspicion for ACS given reassuring workup.  Also low suspicion for PE given normal heart rate, no calf tenderness and shortness of breath improved after treatment.  Advised to follow-up with her PCP.  Discharged stable vitals.  Discussed return precautions.  Patient management required discussion with the following services or consulting groups:  None  Complexity of Problems Addressed Acute complicated illness or Injury  Additional Data Reviewed and Analyzed Further history obtained from: Further history from spouse/family member and Past medical history and medications listed in the EMR  Patient Encounter Risk Assessment Prescriptions         Final Clinical Impression(s) / ED Diagnoses Final diagnoses:  Cough, unspecified type    Rx / DC Orders ED Discharge Orders          Ordered    doxycycline (VIBRAMYCIN) 100 MG capsule  2 times daily        07/25/22 1459              Harriet Pho, PA-C 07/25/22 1814    Kemper Durie, DO 07/26/22 [phone]

## 2022-07-25 NOTE — ED Triage Notes (Signed)
Pt states she feels like she is having another URI  States head and chest congestion x 3 weeks  Denies any fever  States cough and pain with cough

## 2022-07-25 NOTE — Discharge Instructions (Addendum)
Admission for your cough and shortness of breath revealed that you likely having asthma versus COPD exacerbation.  Encouraged that after treatment you are feeling much better.  Your lungs also sounded better upon reevaluation.  If you have worsening shortness of breath, chest pain, extreme fatigue, new fever or any other concerning symptom please return emergency department for further evaluation.

## 2022-07-25 NOTE — ED Notes (Signed)
Reviewed discharge instructions, recommendations and meds with pt. Pt states understanding. Pt feeling better. Resp even and non labored at time of discharge. Ambulatory at time of discharge with daughter

## 2022-07-25 NOTE — ED Notes (Signed)
CAT stopped at this time. Patient stated she feels much better

## 2022-10-04 IMAGING — CT CT ANGIO CHEST
2 of 6 series · 18 of 36 positions shown · IV contrast (agent unspecified)
Comparison: None

CLINICAL DATA: Shortness of breath and cough, COVID positive

EXAM:
CT ANGIOGRAPHY CHEST WITH CONTRAST
TECHNIQUE: Multidetector CT imaging of the chest was performed using the
standard protocol during bolus administration of intravenous
contrast. Multiplanar CT image reconstructions and MIPs were
obtained to evaluate the vascular anatomy.

[Series 5: thins · axial · 0.75mm/px · z∈[+1388,+1676]mm · 17 of 324 slices shown]
[im 18/324  lung]
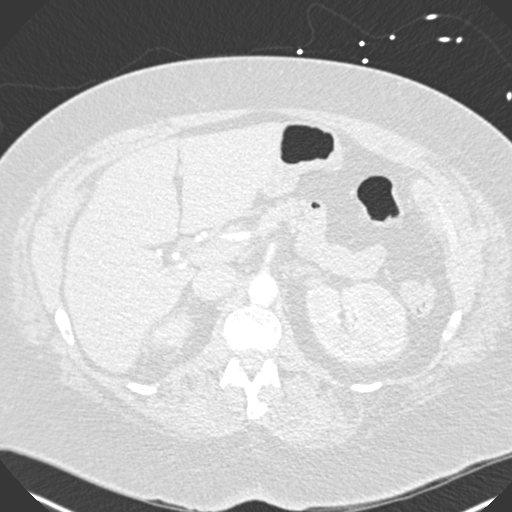
[im 36/324  mediastinal]
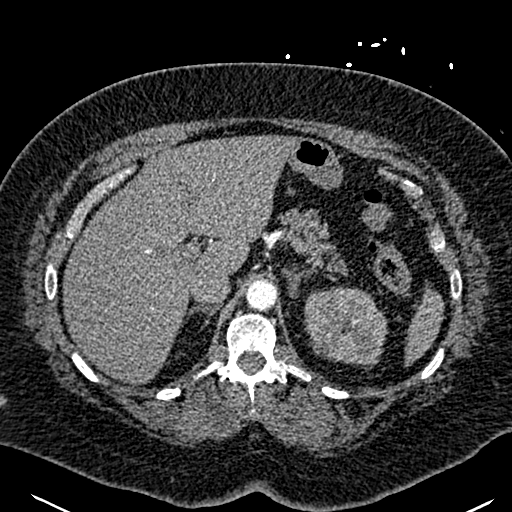
[im 54/324  lung]
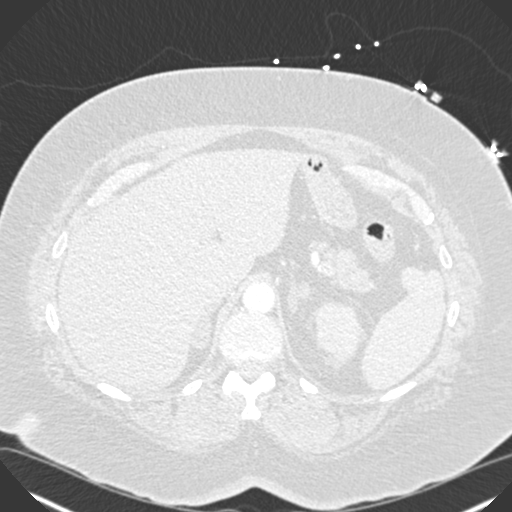
[im 72/324  mediastinal]
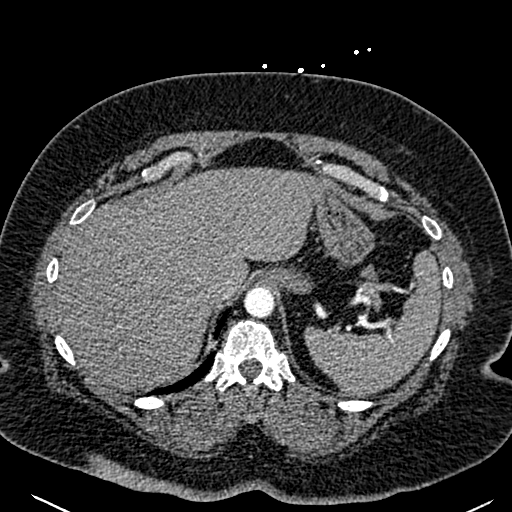
[im 90/324  lung]
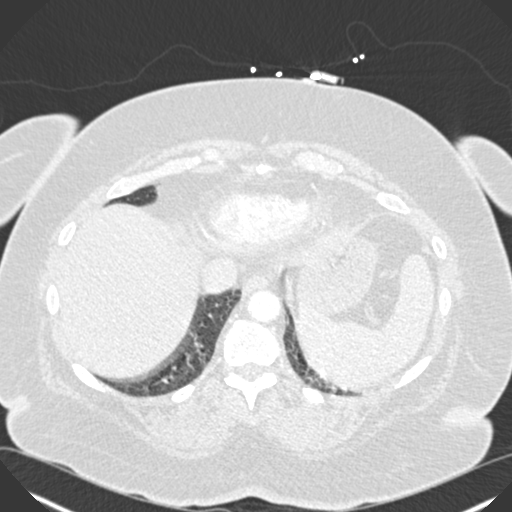
[im 108/324  mediastinal]
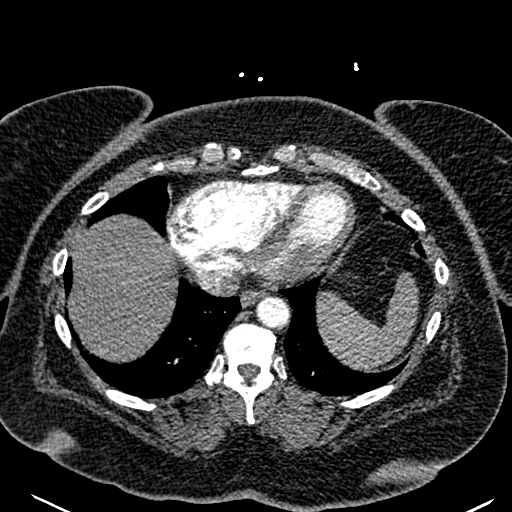
[im 126/324  lung]
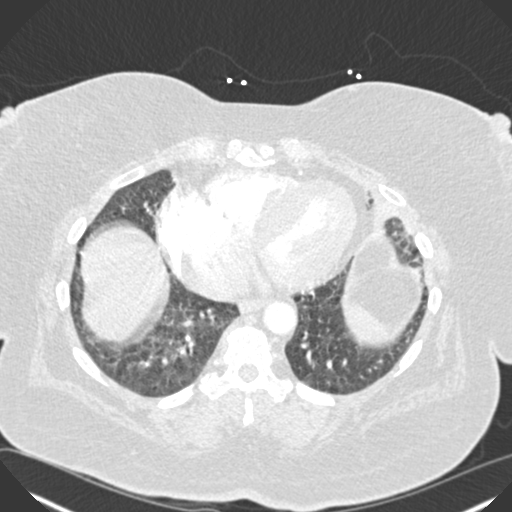
[im 144/324  mediastinal]
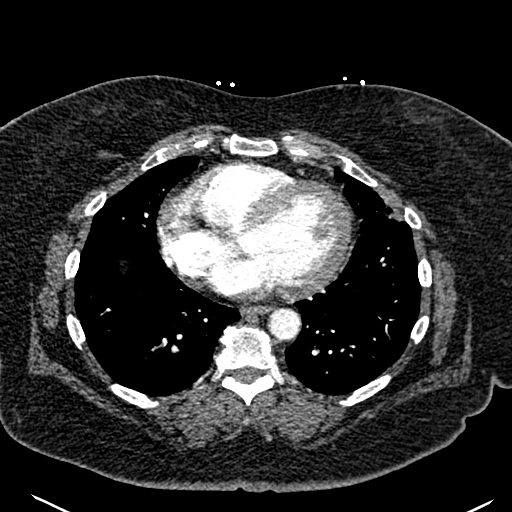
[im 162/324  lung]
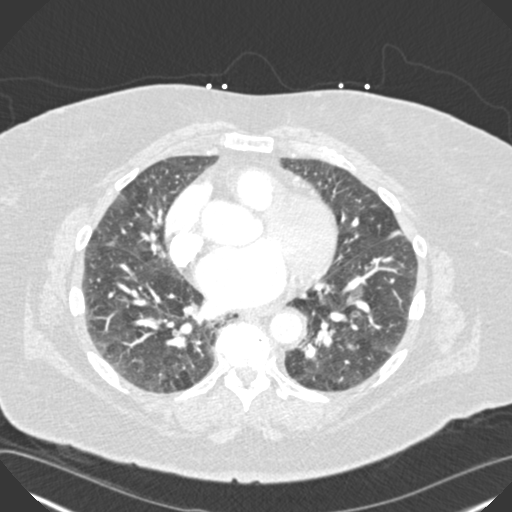
[im 180/324  mediastinal]
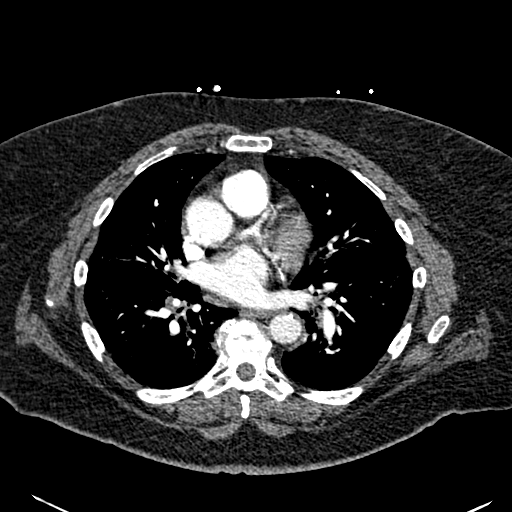
[im 198/324  lung]
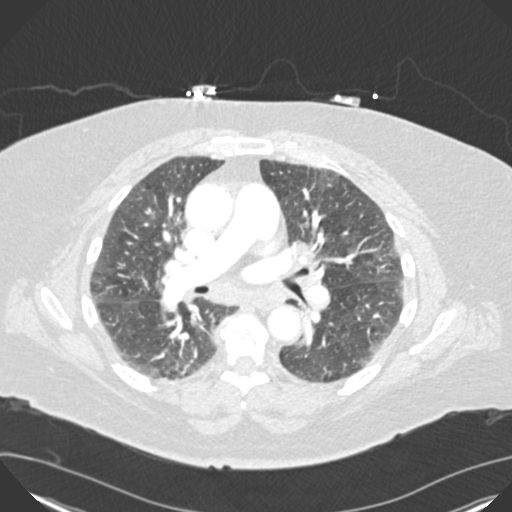
[im 216/324  mediastinal]
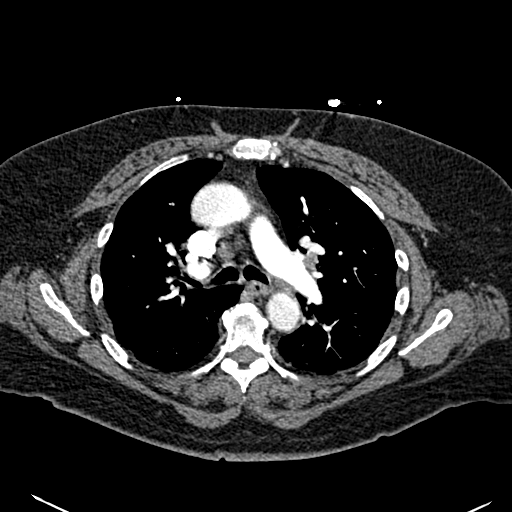
[im 234/324  lung]
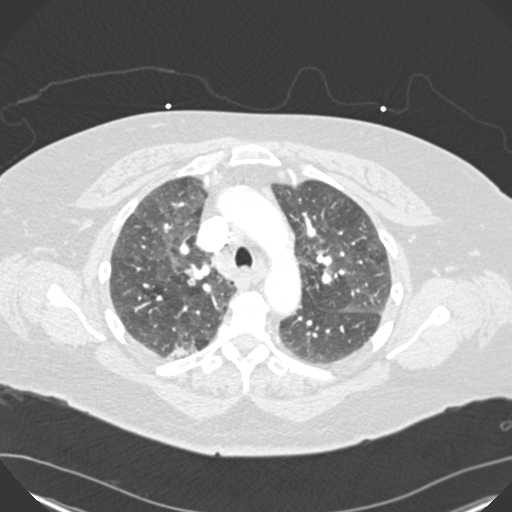
[im 252/324  mediastinal]
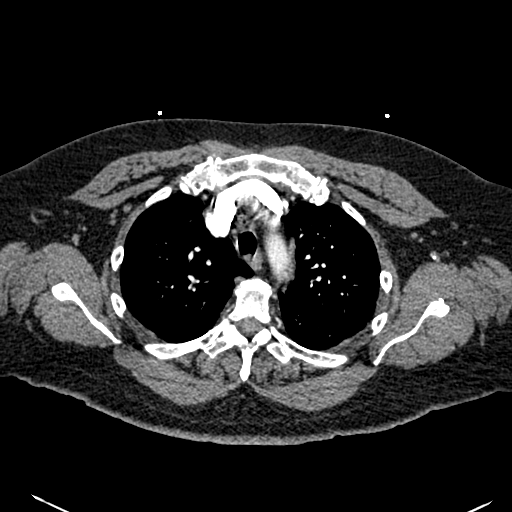
[im 270/324  lung]
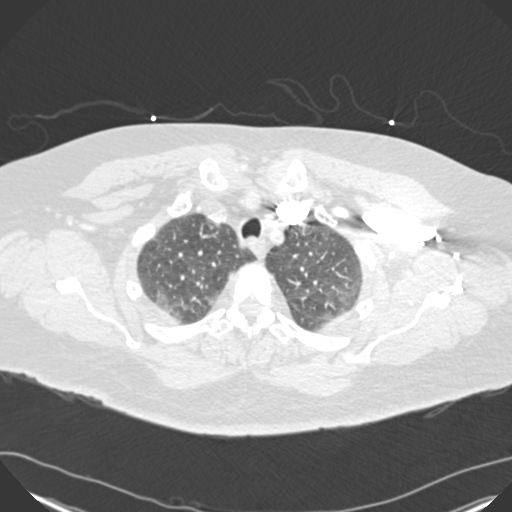
[im 288/324  mediastinal]
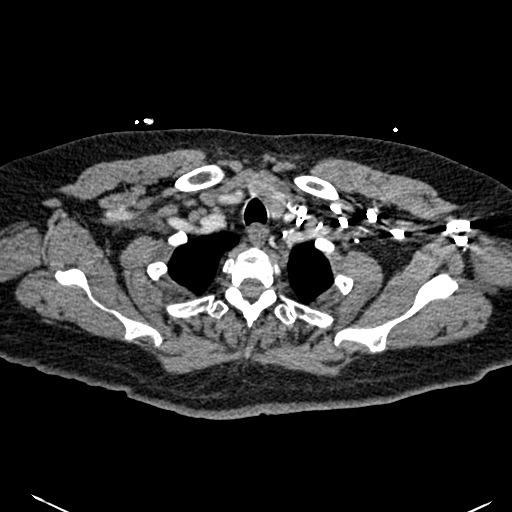
[im 306/324  lung]
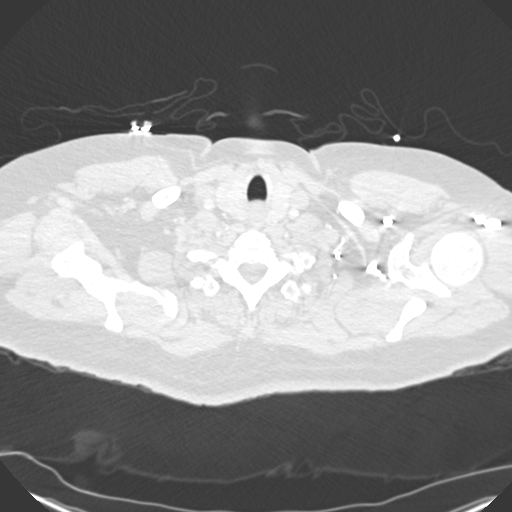

[Series 7: coronal mpr · coronal · 0.66mm/px · 1 of 151 slices shown]
[im 76/151  mediastinal]
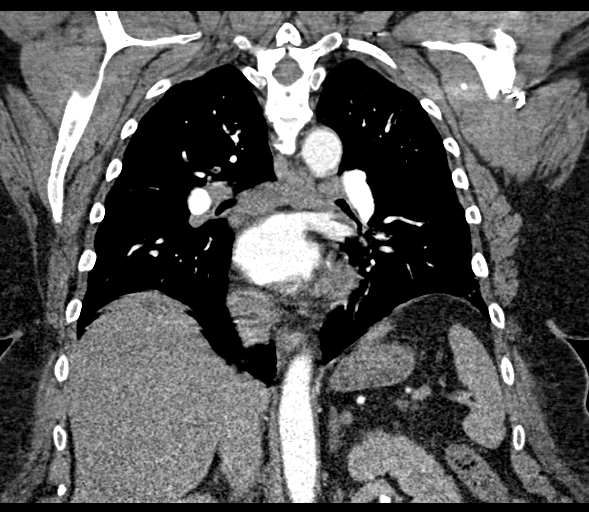

[18 of 36 positions shown; findings below may reference images not displayed]

RADIATION DOSE REDUCTION: This exam was performed according to the
departmental dose-optimization program which includes automated
exposure control, adjustment of the mA and/or kV according to
patient size and/or use of iterative reconstruction technique.

CONTRAST:  80mL OMNIPAQUE IOHEXOL 350 MG/ML SOLN
FINDINGS: Cardiovascular: Adequate contrast opacification of the pulmonary
arteries. No evidence of pulmonary embolus. Normal heart size. No
pericardial effusion. Mild atherosclerotic disease of the thoracic
aorta.

Mediastinum/Nodes: Esophagus and thyroid are unremarkable. Mildly
enlarged hilar lymph nodes, likely reactive. Reference right hilar
lymph node measuring 1.3 cm in short axis on series 4, image 59.

Lungs/Pleura: Central airways are patent. Bilateral bronchial wall
thickening. Bilateral mosaic attenuation which is favored to be due
to air trapping. No focal consolidation. No pleural effusion or
pneumothorax.

Upper Abdomen: Right adrenal gland nodule measuring 1.7 cm with low
attenuation, compatible with benign adenoma, it no follow-up imaging
recommended.

Musculoskeletal: No chest wall abnormality. No acute or significant
osseous findings.

Review of the MIP images confirms the above findings.
IMPRESSION: 1. No evidence of pulmonary embolus.
2. Bilateral bronchial wall thickening, findings can be seen in the
setting of bronchitis.
3. Diffuse mosaic attenuation, likely due to air trapping, which can
be seen in the setting of small airways disease.
4. Aortic Atherosclerosis ([id number]).

## 2022-10-07 IMAGING — DX DG CHEST 1V PORT
2 series · 2 of 2 positions shown · non-contrast
Comparison: 08/18/2021

CLINICAL DATA: COVID positive.  Productive cough since yesterday.

EXAM:
PORTABLE CHEST 1 VIEW

[chest ap (1 of 2)]
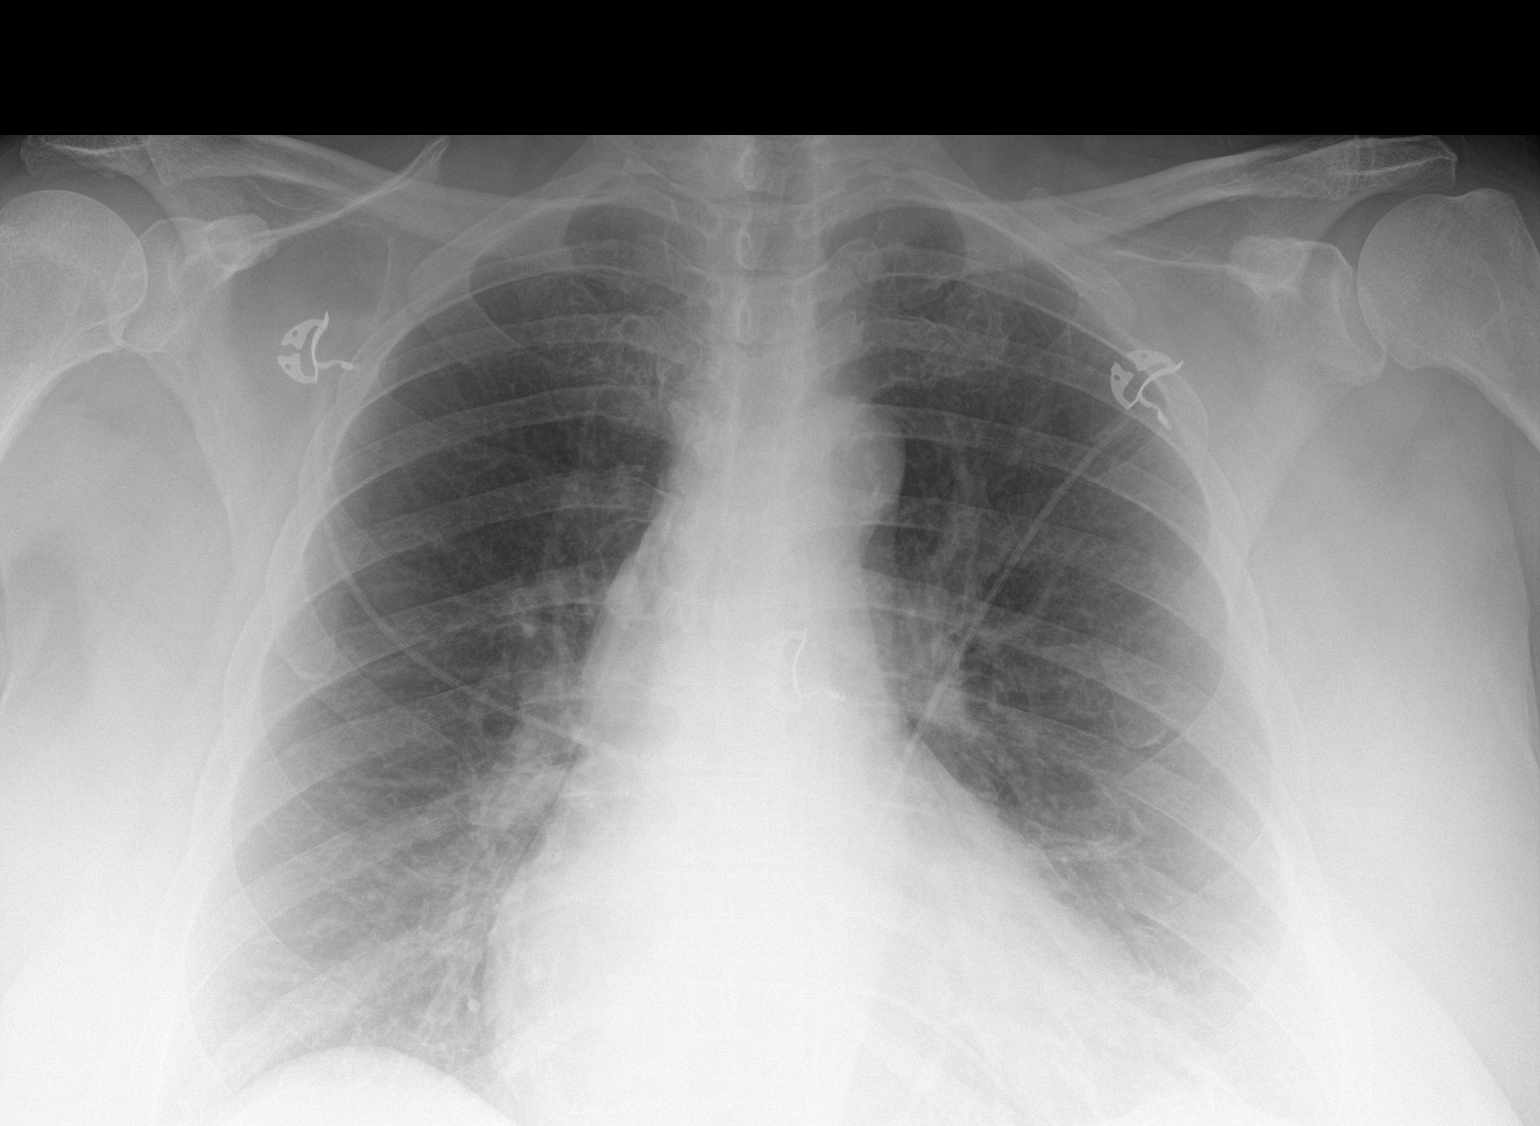

[chest ap (2 of 2)]
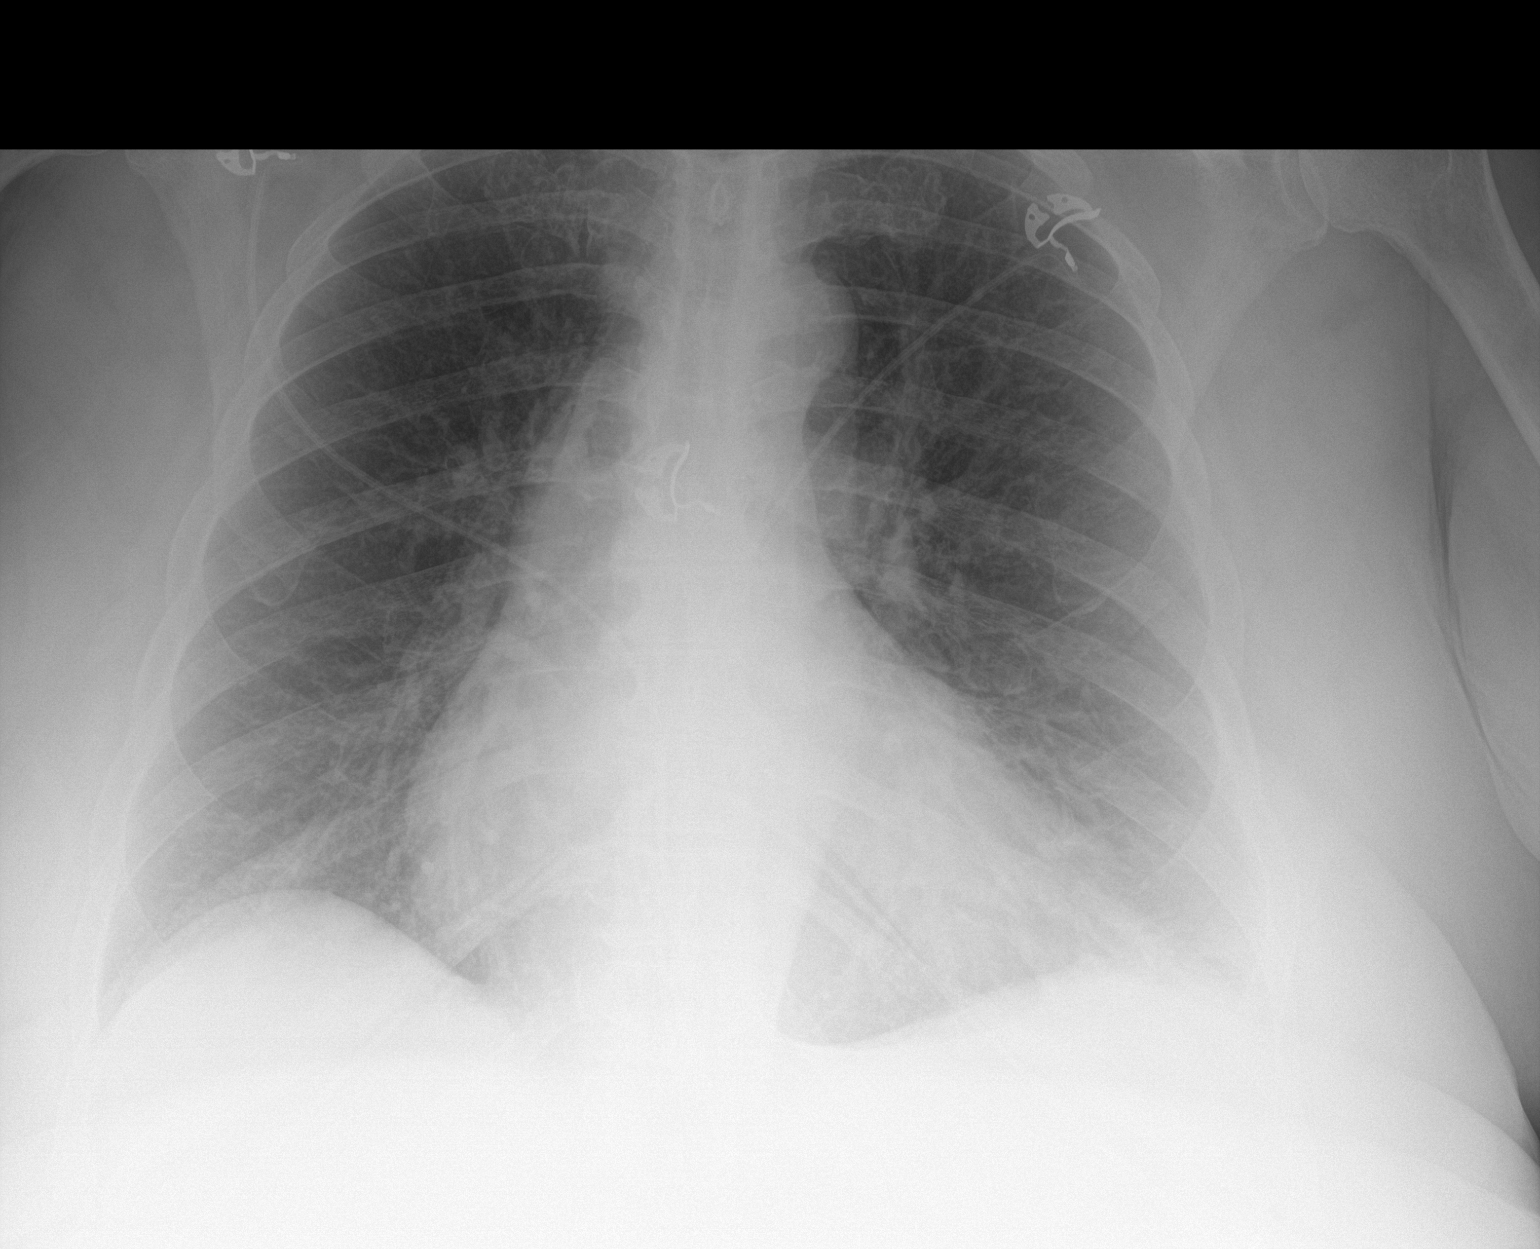

[2 of 2 positions shown; findings below may reference images not displayed]

FINDINGS: Heart size and mediastinal contours are unremarkable. No pleural
effusion or edema identified. Asymmetric opacity within the left
base is new from the previous exam and may represent atelectasis or
pneumonia. Osseous structures appear intact.
IMPRESSION: Asymmetric opacity within the left base may represent atelectasis or
pneumonia.

## 2024-03-21 ENCOUNTER — Encounter (HOSPITAL_COMMUNITY): Payer: Self-pay

## 2024-03-21 ENCOUNTER — Emergency Department (HOSPITAL_COMMUNITY)

## 2024-03-21 ENCOUNTER — Other Ambulatory Visit: Payer: Self-pay

## 2024-03-21 ENCOUNTER — Inpatient Hospital Stay (HOSPITAL_COMMUNITY)
Admission: EM | Admit: 2024-03-21 | Discharge: 2024-03-24 | DRG: 189 | Disposition: A | Discharge status: Home / Self Care | Attending: Internal Medicine | Admitting: Internal Medicine

## 2024-03-21 DIAGNOSIS — J440 Chronic obstructive pulmonary disease with acute lower respiratory infection: Secondary | ICD-10-CM | POA: Diagnosis present

## 2024-03-21 DIAGNOSIS — N179 Acute kidney failure, unspecified: Secondary | ICD-10-CM | POA: Diagnosis present

## 2024-03-21 DIAGNOSIS — I5033 Acute on chronic diastolic (congestive) heart failure: Secondary | ICD-10-CM | POA: Diagnosis present

## 2024-03-21 DIAGNOSIS — Z888 Allergy status to other drugs, medicaments and biological substances status: Secondary | ICD-10-CM

## 2024-03-21 DIAGNOSIS — F17210 Nicotine dependence, cigarettes, uncomplicated: Secondary | ICD-10-CM | POA: Diagnosis present

## 2024-03-21 DIAGNOSIS — Z79899 Other long term (current) drug therapy: Secondary | ICD-10-CM | POA: Diagnosis not present

## 2024-03-21 DIAGNOSIS — Z1152 Encounter for screening for COVID-19: Secondary | ICD-10-CM | POA: Diagnosis not present

## 2024-03-21 DIAGNOSIS — E1165 Type 2 diabetes mellitus with hyperglycemia: Secondary | ICD-10-CM | POA: Diagnosis present

## 2024-03-21 DIAGNOSIS — J209 Acute bronchitis, unspecified: Secondary | ICD-10-CM | POA: Diagnosis present

## 2024-03-21 DIAGNOSIS — E8809 Other disorders of plasma-protein metabolism, not elsewhere classified: Secondary | ICD-10-CM | POA: Diagnosis present

## 2024-03-21 DIAGNOSIS — Z833 Family history of diabetes mellitus: Secondary | ICD-10-CM | POA: Diagnosis not present

## 2024-03-21 DIAGNOSIS — J441 Chronic obstructive pulmonary disease with (acute) exacerbation: Principal | ICD-10-CM | POA: Diagnosis present

## 2024-03-21 DIAGNOSIS — Z7984 Long term (current) use of oral hypoglycemic drugs: Secondary | ICD-10-CM | POA: Diagnosis not present

## 2024-03-21 DIAGNOSIS — J9600 Acute respiratory failure, unspecified whether with hypoxia or hypercapnia: Secondary | ICD-10-CM | POA: Diagnosis not present

## 2024-03-21 DIAGNOSIS — I110 Hypertensive heart disease with heart failure: Secondary | ICD-10-CM | POA: Diagnosis present

## 2024-03-21 DIAGNOSIS — E872 Acidosis, unspecified: Secondary | ICD-10-CM | POA: Diagnosis present

## 2024-03-21 DIAGNOSIS — G8929 Other chronic pain: Secondary | ICD-10-CM | POA: Diagnosis present

## 2024-03-21 DIAGNOSIS — J208 Acute bronchitis due to other specified organisms: Secondary | ICD-10-CM

## 2024-03-21 DIAGNOSIS — J449 Chronic obstructive pulmonary disease, unspecified: Secondary | ICD-10-CM | POA: Diagnosis present

## 2024-03-21 DIAGNOSIS — Z7982 Long term (current) use of aspirin: Secondary | ICD-10-CM | POA: Diagnosis not present

## 2024-03-21 DIAGNOSIS — Z886 Allergy status to analgesic agent status: Secondary | ICD-10-CM | POA: Diagnosis not present

## 2024-03-21 DIAGNOSIS — E119 Type 2 diabetes mellitus without complications: Secondary | ICD-10-CM | POA: Diagnosis not present

## 2024-03-21 DIAGNOSIS — Z794 Long term (current) use of insulin: Secondary | ICD-10-CM | POA: Diagnosis not present

## 2024-03-21 DIAGNOSIS — D696 Thrombocytopenia, unspecified: Secondary | ICD-10-CM | POA: Diagnosis present

## 2024-03-21 DIAGNOSIS — R0602 Shortness of breath: Secondary | ICD-10-CM | POA: Diagnosis present

## 2024-03-21 DIAGNOSIS — J9601 Acute respiratory failure with hypoxia: Principal | ICD-10-CM | POA: Diagnosis present

## 2024-03-21 DIAGNOSIS — R791 Abnormal coagulation profile: Secondary | ICD-10-CM | POA: Diagnosis present

## 2024-03-21 DIAGNOSIS — R197 Diarrhea, unspecified: Secondary | ICD-10-CM | POA: Diagnosis present

## 2024-03-21 DIAGNOSIS — Z716 Tobacco abuse counseling: Secondary | ICD-10-CM

## 2024-03-21 DIAGNOSIS — I5031 Acute diastolic (congestive) heart failure: Secondary | ICD-10-CM | POA: Diagnosis not present

## 2024-03-21 DIAGNOSIS — I10 Essential (primary) hypertension: Secondary | ICD-10-CM | POA: Diagnosis not present

## 2024-03-21 DIAGNOSIS — I509 Heart failure, unspecified: Secondary | ICD-10-CM

## 2024-03-21 DIAGNOSIS — A419 Sepsis, unspecified organism: Principal | ICD-10-CM

## 2024-03-21 LAB — I-STAT CG4 LACTIC ACID, ED
Lactic Acid, Venous: 3.1 mmol/L (ref 0.5–1.9)
Lactic Acid, Venous: 3 mmol/L (ref 0.5–1.9)

## 2024-03-21 LAB — CBC WITH DIFFERENTIAL/PLATELET
Abs Immature Granulocytes: 0.14 K/uL — ABNORMAL HIGH (ref 0.00–0.07)
Basophils Absolute: 0.1 K/uL (ref 0.0–0.1)
Basophils Relative: 0 %
Eosinophils Absolute: 0.1 K/uL (ref 0.0–0.5)
Eosinophils Relative: 1 %
HCT: 59.4 % — ABNORMAL HIGH (ref 36.0–46.0)
Hemoglobin: 18.6 g/dL — ABNORMAL HIGH (ref 12.0–15.0)
Immature Granulocytes: 1 %
Lymphocytes Relative: 2 %
Lymphs Abs: 0.2 K/uL — ABNORMAL LOW (ref 0.7–4.0)
MCH: 31.3 pg (ref 26.0–34.0)
MCHC: 31.3 g/dL (ref 30.0–36.0)
MCV: 99.8 fL (ref 80.0–100.0)
Monocytes Absolute: 0.4 K/uL (ref 0.1–1.0)
Monocytes Relative: 3 %
Neutro Abs: 13.4 K/uL — ABNORMAL HIGH (ref 1.7–7.7)
Neutrophils Relative %: 93 %
Platelets: 84 K/uL — ABNORMAL LOW (ref 150–400)
RBC: 5.95 MIL/uL — ABNORMAL HIGH (ref 3.87–5.11)
RDW: 13.8 % (ref 11.5–15.5)
Smear Review: NORMAL
WBC: 14.4 K/uL — ABNORMAL HIGH (ref 4.0–10.5)
nRBC: 0 % (ref 0.0–0.2)

## 2024-03-21 LAB — DIFFERENTIAL
Abs Immature Granulocytes: 0.29 K/uL — ABNORMAL HIGH (ref 0.00–0.07)
Basophils Absolute: 0.1 K/uL (ref 0.0–0.1)
Basophils Relative: 0 %
Eosinophils Absolute: 0 K/uL (ref 0.0–0.5)
Eosinophils Relative: 0 %
Immature Granulocytes: 1 %
Lymphocytes Relative: 2 %
Lymphs Abs: 0.3 K/uL — ABNORMAL LOW (ref 0.7–4.0)
Monocytes Absolute: 0.6 K/uL (ref 0.1–1.0)
Monocytes Relative: 3 %
Neutro Abs: 18.8 K/uL — ABNORMAL HIGH (ref 1.7–7.7)
Neutrophils Relative %: 94 %
Smear Review: NORMAL

## 2024-03-21 LAB — HEMOGLOBIN A1C
Hgb A1c MFr Bld: 7 % — ABNORMAL HIGH (ref 4.8–5.6)
Mean Plasma Glucose: 154.2 mg/dL

## 2024-03-21 LAB — CBC
HCT: 57 % — ABNORMAL HIGH (ref 36.0–46.0)
Hemoglobin: 17.9 g/dL — ABNORMAL HIGH (ref 12.0–15.0)
MCH: 31.3 pg (ref 26.0–34.0)
MCHC: 31.4 g/dL (ref 30.0–36.0)
MCV: 99.7 fL (ref 80.0–100.0)
Platelets: 86 K/uL — ABNORMAL LOW (ref 150–400)
RBC: 5.72 MIL/uL — ABNORMAL HIGH (ref 3.87–5.11)
RDW: 13.8 % (ref 11.5–15.5)
WBC: 20.1 K/uL — ABNORMAL HIGH (ref 4.0–10.5)
nRBC: 0 % (ref 0.0–0.2)

## 2024-03-21 LAB — COMPREHENSIVE METABOLIC PANEL WITH GFR
ALT: 12 U/L (ref 0–44)
AST: 26 U/L (ref 15–41)
Albumin: 3.4 g/dL — ABNORMAL LOW (ref 3.5–5.0)
Alkaline Phosphatase: 83 U/L (ref 38–126)
Anion gap: 14 (ref 5–15)
BUN: 19 mg/dL (ref 8–23)
CO2: 20 mmol/L — ABNORMAL LOW (ref 22–32)
Calcium: 9.2 mg/dL (ref 8.9–10.3)
Chloride: 104 mmol/L (ref 98–111)
Creatinine, Ser: 1.16 mg/dL — ABNORMAL HIGH (ref 0.44–1.00)
GFR, Estimated: 53 mL/min — ABNORMAL LOW (ref >60)
Glucose, Bld: 179 mg/dL — ABNORMAL HIGH (ref 70–99)
Potassium: 3.5 mmol/L (ref 3.5–5.1)
Sodium: 139 mmol/L (ref 135–145)
Total Bilirubin: 1.3 mg/dL — ABNORMAL HIGH (ref 0.0–1.2)
Total Protein: 6.9 g/dL (ref 6.5–8.1)

## 2024-03-21 LAB — LACTIC ACID, PLASMA
Lactic Acid, Venous: 2.6 mmol/L (ref 0.5–1.9)
Lactic Acid, Venous: 2.3 mmol/L (ref 0.5–1.9)

## 2024-03-21 LAB — CBG MONITORING, ED: Glucose-Capillary: 203 mg/dL — ABNORMAL HIGH (ref 70–99)

## 2024-03-21 LAB — TECHNOLOGIST SMEAR REVIEW
Plt Morphology: NORMAL
WBC MORPHOLOGY: INCREASED

## 2024-03-21 LAB — RESP PANEL BY RT-PCR (RSV, FLU A&B, COVID)  RVPGX2
Influenza A by PCR: NEGATIVE
Influenza B by PCR: NEGATIVE
Resp Syncytial Virus by PCR: NEGATIVE
SARS Coronavirus 2 by RT PCR: NEGATIVE

## 2024-03-21 LAB — PROTIME-INR
INR: 1.3 — ABNORMAL HIGH (ref 0.8–1.2)
Prothrombin Time: 16.9 s — ABNORMAL HIGH (ref 11.4–15.2)

## 2024-03-21 LAB — APTT: aPTT: 40 s — ABNORMAL HIGH (ref 24–36)

## 2024-03-21 LAB — GLUCOSE, CAPILLARY: Glucose-Capillary: 167 mg/dL — ABNORMAL HIGH (ref 70–99)

## 2024-03-21 LAB — PRO BRAIN NATRIURETIC PEPTIDE: Pro Brain Natriuretic Peptide: 2893 pg/mL — ABNORMAL HIGH (ref <300.0)

## 2024-03-21 LAB — PROCALCITONIN: Procalcitonin: 52.4 ng/mL

## 2024-03-21 LAB — TROPONIN T, HIGH SENSITIVITY: Troponin T High Sensitivity: <15 ng/L (ref 0–19)

## 2024-03-21 LAB — MRSA NEXT GEN BY PCR, NASAL: MRSA by PCR Next Gen: NOT DETECTED

## 2024-03-21 LAB — HIV ANTIBODY (ROUTINE TESTING W REFLEX): HIV Screen 4th Generation wRfx: NONREACTIVE

## 2024-03-21 LAB — FIBRINOGEN: Fibrinogen: 567 mg/dL — ABNORMAL HIGH (ref 210–475)

## 2024-03-21 MED ORDER — INSULIN ASPART 100 UNIT/ML IJ SOLN
0.0000 [IU] | INTRAMUSCULAR | Status: DC
  Administered 2024-03-21 – 2024-03-22 (×4): 3 [IU] via SUBCUTANEOUS
  Administered 2024-03-22 (×2): 5 [IU] via SUBCUTANEOUS
  Administered 2024-03-22: 8 [IU] via SUBCUTANEOUS
  Administered 2024-03-22: 5 [IU] via SUBCUTANEOUS
  Administered 2024-03-23: 2 [IU] via SUBCUTANEOUS
  Administered 2024-03-23: 5 [IU] via SUBCUTANEOUS
  Administered 2024-03-23: 3 [IU] via SUBCUTANEOUS
  Administered 2024-03-23: 5 [IU] via SUBCUTANEOUS
  Administered 2024-03-23: 3 [IU] via SUBCUTANEOUS
  Administered 2024-03-24: 2 [IU] via SUBCUTANEOUS
  Administered 2024-03-24 (×2): 3 [IU] via SUBCUTANEOUS
  Administered 2024-03-24: 8 [IU] via SUBCUTANEOUS
  Filled 2024-03-21: qty 0.15

## 2024-03-21 MED ORDER — POLYETHYLENE GLYCOL 3350 17 G PO PACK: 17.0000 g | PACK | Freq: Every day | ORAL | Status: DC | PRN

## 2024-03-21 MED ORDER — REVEFENACIN 175 MCG/3ML IN SOLN
175.0000 ug | Freq: Every day | RESPIRATORY_TRACT | Status: DC
  Administered 2024-03-21 – 2024-03-24 (×4): 175 ug via RESPIRATORY_TRACT
  Filled 2024-03-21 (×4): qty 3

## 2024-03-21 MED ORDER — FUROSEMIDE 10 MG/ML IJ SOLN: 60.0000 mg | Freq: Once | INTRAMUSCULAR | Status: DC

## 2024-03-21 MED ORDER — SODIUM CHLORIDE 0.9 % IV SOLN
2.0000 g | INTRAVENOUS | Status: DC
  Administered 2024-03-21: 2 g via INTRAVENOUS
  Filled 2024-03-21: qty 20

## 2024-03-21 MED ORDER — VANCOMYCIN HCL IN DEXTROSE 1-5 GM/200ML-% IV SOLN
1000.0000 mg | Freq: Once | INTRAVENOUS | Status: AC
  Administered 2024-03-21: 1000 mg via INTRAVENOUS
  Filled 2024-03-21: qty 200

## 2024-03-21 MED ORDER — LACTATED RINGERS IV SOLN: INTRAVENOUS | Status: DC

## 2024-03-21 MED ORDER — SODIUM CHLORIDE 0.9 % IV BOLUS
500.0000 mL | Freq: Once | INTRAVENOUS | Status: AC
  Administered 2024-03-21: 500 mL via INTRAVENOUS

## 2024-03-21 MED ORDER — SODIUM CHLORIDE 0.9 % IV BOLUS: 500.0000 mL | Freq: Once | INTRAVENOUS | Status: DC

## 2024-03-21 MED ORDER — SODIUM CHLORIDE 0.9 % IV SOLN
2.0000 g | Freq: Once | INTRAVENOUS | Status: AC
  Administered 2024-03-21: 2 g via INTRAVENOUS
  Filled 2024-03-21: qty 12.5

## 2024-03-21 MED ORDER — PREDNISONE 20 MG PO TABS
40.0000 mg | ORAL_TABLET | Freq: Every day | ORAL | Status: DC
Start: 2024-03-22 — End: 2024-03-24
  Administered 2024-03-22 – 2024-03-24 (×3): 40 mg via ORAL
  Filled 2024-03-21 (×3): qty 2

## 2024-03-21 MED ORDER — LACTATED RINGERS IV BOLUS
1000.0000 mL | Freq: Once | INTRAVENOUS | Status: AC
  Administered 2024-03-21: 1000 mL via INTRAVENOUS

## 2024-03-21 MED ORDER — BUDESONIDE 0.25 MG/2ML IN SUSP
0.2500 mg | Freq: Two times a day (BID) | RESPIRATORY_TRACT | Status: DC
  Administered 2024-03-21: 0.25 mg via RESPIRATORY_TRACT
  Filled 2024-03-21 (×2): qty 2

## 2024-03-21 MED ORDER — POTASSIUM CHLORIDE CRYS ER 20 MEQ PO TBCR
40.0000 meq | EXTENDED_RELEASE_TABLET | Freq: Once | ORAL | Status: AC
  Administered 2024-03-21: 40 meq via ORAL
  Filled 2024-03-21: qty 2

## 2024-03-21 MED ORDER — CHLORHEXIDINE GLUCONATE CLOTH 2 % EX PADS
6.0000 | MEDICATED_PAD | Freq: Every day | CUTANEOUS | Status: DC
  Administered 2024-03-21 – 2024-03-24 (×2): 6 via TOPICAL

## 2024-03-21 MED ORDER — METRONIDAZOLE 500 MG/100ML IV SOLN
500.0000 mg | Freq: Once | INTRAVENOUS | Status: AC
  Administered 2024-03-21: 500 mg via INTRAVENOUS
  Filled 2024-03-21: qty 100

## 2024-03-21 MED ORDER — ARFORMOTEROL TARTRATE 15 MCG/2ML IN NEBU
15.0000 ug | INHALATION_SOLUTION | Freq: Two times a day (BID) | RESPIRATORY_TRACT | Status: DC
  Administered 2024-03-21 – 2024-03-24 (×6): 15 ug via RESPIRATORY_TRACT
  Filled 2024-03-21 (×6): qty 2

## 2024-03-21 MED ORDER — ENOXAPARIN SODIUM 40 MG/0.4ML IJ SOSY: 40.0000 mg | PREFILLED_SYRINGE | INTRAMUSCULAR | Status: DC

## 2024-03-21 MED ORDER — DOCUSATE SODIUM 100 MG PO CAPS: 100.0000 mg | ORAL_CAPSULE | Freq: Two times a day (BID) | ORAL | Status: DC | PRN

## 2024-03-21 NOTE — Consult Note (Signed)
 Critical care attending attestation note:   I agree with the Advanced Practitioner's note, impression, and recommendations as outlined. I have taken an independent interval history, reviewed the chart and examined the patient. The following reflects my medical decision making and independent critical care time    Summary of Assessment and Plan   56 female who with DM2, asthma, smoker presented with complaints of fever cough general body ache.  On arrival she was 80% on room air.  She was placed on 4 L of oxygen.  The patient continued to be drowsy and hence CCM was called. Talking to the patient she says she has been feeling short of breath for last 1 week.  She has been having cough with clear phlegm.  Denies orthopnea. No sick contacts. Chronic smoker and still smokes 7 cigarettes a day. States she will be seeing pulmonary with [facility] soon and she currently takes albuterol  only.  Pertinent Physical Exam:  General: Middle-age lady who is morbidly obese.  In no apparent distress. Lungs: Prolonged expiratory breath sounds with some end expiratory wheeze. Heart: regular rate rhythm, no murmur appreciated.  Abdomen: non tender, non distended. Normal BS.  Neuro: axox 3.    Labs and Radiology reviewed. Chest x-ray congestion with left effusion/infiltrate. EKG no ST-T changes. Lactate 3.1> 3. proBNP 2900. Decrease in platelets to 84.  A/P:  Acute hypoxic respiratory failure: Dyspnea: Acute COPD with exacerbation. Less likely to be CHF exacerbation: -Outside echo with EF 55%. -Bedside POCUS: Normal EF, normal RV size, IVC greater than 50% collapsible, thickened pleura bilaterally with some B-lines.  No pleural effusion bilaterally. - RPP and infectious workup. -Continue antibiotics. -Steroids for COPD. -Echo. -Monitor in ICU.  AKI: - Likely related to above. - Will give more fluids.  Thrombocytopenia: -Unclear cause.  Could be due to DIC.  INR 1.3.  TTP seems less likely but in  differential with new onset confusion. -Will order workup.  Rest of the plan per App Note.    CRITICAL CARE Performed by: Sammi JONETTA Fredericks.     Total critical care time: 35 minutes   Critical care time was exclusive of separately billable procedures and treating other patients.   Critical care was necessary to treat or prevent imminent or life-threatening deterioration.   Critical care was time spent personally by me on the following activities: development of treatment plan with patient and/or surrogate as well as nursing, discussions with consultants, evaluation of patient's response to treatment, examination of patient, obtaining history from patient or surrogate, ordering and performing treatments and interventions, ordering and review of laboratory studies, ordering and review of radiographic studies, pulse oximetry, re-evaluation of patient's condition and participation in multidisciplinary rounds.  Sammi JONETTA Fredericks, MD Pulmonary, Critical Care and Sleep Attending.  Pager: [phone]  03/21/2024, 4:25 PM

## 2024-03-21 NOTE — ED Provider Notes (Signed)
 [place] EMERGENCY DEPARTMENT AT [facility] Provider Note   CSN: [id number] Arrival date & time: 03/21/24  1236     Patient presents with: No chief complaint on file.   Lisa Ibarra is a 63 y.o. female.   HPI  Pt to er via ems per ems pt is here for a couple days of fever, cough and general body aches, pt is 80% on room air, pt placed on 4 L via [place], pt satting 83% on 4L, pt is drowsy, pt placed on nrb, 89 on NRB.  Ems states that they started an iv gave tylenol  650mg , zofran  4mg , soulumedrol 125 and albutertol 5mg  and 250 ml ns.        Prior to Admission medications   Medication Sig Start Date End Date Taking? Authorizing Provider  albuterol  (VENTOLIN  HFA) 108 (90 Base) MCG/ACT inhaler Inhale 2 puffs into the lungs every 6 (six) hours as needed for wheezing or shortness of breath. 08/22/21   Jerri Keys, MD  amLODipine  (NORVASC ) 5 MG tablet Take 1 tablet (5 mg total) by mouth daily. 02/23/22 03/25/22  Curatolo, Adam, DO  amoxicillin -clavulanate (AUGMENTIN ) 875-125 MG tablet Take 1 tablet by mouth every 12 (twelve) hours. 02/01/22   Hildegard Loge, PA-C  ascorbic acid  (VITAMIN C) 500 MG tablet Take 1 tablet (500 mg total) by mouth daily. 08/22/21   Jerri Keys, MD  Aspirin-Acetaminophen -Caffeine  (GOODYS EXTRA STRENGTH) 500-325-65 MG PACK Take 1 packet by mouth daily as needed (pain).    [provider]  blood glucose meter kit and supplies KIT Dispense based on patient and insurance preference. Use up to four times daily as directed. 08/22/21   Jerri Keys, MD  doxycycline  (VIBRAMYCIN ) 100 MG capsule Take 1 capsule (100 mg total) by mouth 2 (two) times daily. 07/25/22   Robinson, John K, PA-C  glipiZIDE (GLUCOTROL) 10 MG tablet Take 10 mg by mouth 2 (two) times daily. 07/22/21   [provider]  hydrALAZINE  (APRESOLINE ) 10 MG tablet Take 1 tablet (10 mg total) by mouth every 8 (eight) hours as needed (for sbp> 170). 08/22/21   Jerri Keys, MD  insulin  aspart (NOVOLOG ) 100 UNIT/ML  FlexPen Insulin  sliding scale: Blood sugar  120-150   3units                       151-200   4units                       201-250   7units                       251- 300  11units                       301-350   15uints                       351-400   20units                       >400         call MD immediately 08/22/21   Jerri Keys, MD  insulin  detemir (LEVEMIR ) 100 UNIT/ML FlexPen Inject 12 Units into the skin 2 (two) times daily. Stop using levemir  insulin  if you am blood glucose less than 80 08/22/21   Jerri Keys, MD  Insulin  Pen Needle 29G X MISC For insulin  injection 08/22/21  Jerri Keys, MD  losartan  (COZAAR ) 50 MG tablet Take 2 tablets (100 mg total) by mouth daily. 08/22/21   Jerri Keys, MD  metFORMIN  (GLUCOPHAGE -XR) 500 MG 24 hr tablet Take 1,000 mg by mouth 2 (two) times daily. 06/23/21   [provider]  metoprolol  succinate (TOPROL -XL) 50 MG 24 hr tablet Take 1 tablet (50 mg total) by mouth at bedtime. Take with or immediately following a meal. 08/22/21   Jerri Keys, MD  predniSONE  (DELTASONE ) 10 MG tablet Please take prednisone  with breakfast Please take 2tabs on 4/3, then take 1tab on 4/4, then stop. 08/22/21   Jerri Keys, MD  zinc  sulfate 220 (50 Zn) MG capsule Take 1 capsule (220 mg total) by mouth daily. 08/22/21   Jerri Keys, MD    Allergies: Motrin [ibuprofen] and Empagliflozin    Review of Systems  Updated Vital Signs BP 118/71   Pulse (!) 105   Temp 99.1 F (37.3 C) (Oral)   Resp (!) 30   Ht 1.753 m (5' 9)   Wt 127 kg   LMP 06/12/2011   SpO2 94%   BMI 41.35 kg/m   Physical Exam Vitals and nursing note reviewed.  Constitutional:      Appearance: She is well-developed. She is obese. She is ill-appearing.  HENT:     Head: Normocephalic and atraumatic.  Eyes:     Conjunctiva/sclera: Conjunctivae normal.  Cardiovascular:     Rate and Rhythm: Regular rhythm. Tachycardia present.  Pulmonary:     Effort: Pulmonary effort is normal. No respiratory distress.     Breath  sounds: Normal breath sounds. Decreased air movement present. No stridor.  Abdominal:     General: There is no distension.  Skin:    General: Skin is warm and dry.  Neurological:     Mental Status: She is alert and oriented to person, place, and time.     Cranial Nerves: No cranial nerve deficit.     Motor: Atrophy present.  Psychiatric:        Mood and Affect: Mood normal.     (all labs ordered are listed, but only abnormal results are displayed) Labs Reviewed  COMPREHENSIVE METABOLIC PANEL WITH GFR - Abnormal; Notable for the following components:      Result Value   CO2 20 (*)    Glucose, Bld 179 (*)    Creatinine, Ser 1.16 (*)    Albumin 3.4 (*)    Total Bilirubin 1.3 (*)    GFR, Estimated 53 (*)    All other components within normal limits  CBC WITH DIFFERENTIAL/PLATELET - Abnormal; Notable for the following components:   WBC 14.4 (*)    RBC 5.95 (*)    Hemoglobin 18.6 (*)    HCT 59.4 (*)    Platelets 84 (*)    Neutro Abs 13.4 (*)    Lymphs Abs 0.2 (*)    Abs Immature Granulocytes 0.14 (*)    All other components within normal limits  PROTIME-INR - Abnormal; Notable for the following components:   Prothrombin Time 16.9 (*)    INR 1.3 (*)    All other components within normal limits  I-STAT CG4 LACTIC ACID, ED - Abnormal; Notable for the following components:   Lactic Acid, Venous 3.1 (*)    All other components within normal limits  RESP PANEL BY RT-PCR (RSV, FLU A&B, COVID)  RVPGX2  CULTURE, BLOOD (ROUTINE X 2)  CULTURE, BLOOD (ROUTINE X 2)  URINALYSIS, W/ REFLEX TO CULTURE (INFECTION SUSPECTED)  EKG: None  Radiology: [facility] Chest Port 1 View Result Date: 03/21/2024 CLINICAL DATA:  Possible sepsis.  Fever, cough and body aches. EXAM: PORTABLE CHEST 1 VIEW COMPARISON:  07/25/2022 FINDINGS: Lungs are somewhat hypoinflated without focal lobar consolidation or effusion. Subtle hazy prominence of the central pulmonary vessels likely due to mild vascular  congestion. Cardiomediastinal silhouette and remainder of the exam is unchanged. IMPRESSION: Suggestion of mild vascular congestion. Electronically Signed   By: Toribio Agreste M.D.   On: 03/21/2024 13:48     Procedures   Medications Ordered in the ED  lactated ringers infusion ( Intravenous New Bag/Given 03/21/24 1345)  metroNIDAZOLE (FLAGYL) IVPB 500 mg (500 mg Intravenous New Bag/Given 03/21/24 1351)  vancomycin (VANCOCIN) IVPB 1000 mg/200 mL premix (1,000 mg Intravenous New Bag/Given 03/21/24 1353)  sodium chloride  0.9 % bolus 500 mL (has no administration in time range)  ceFEPIme (MAXIPIME) 2 g in sodium chloride  0.9 % 100 mL IVPB (0 g Intravenous Stopped 03/21/24 1429)                                    Medical Decision Making Patient presents from home with fatigue, oxygen requirement that is new.  Patient's medical history is substantial, but with initial signs and symptoms concerning for sepsis patient received empiric antibiotics, fluids, continuous monitoring, pulse oximetry. Pulse ox 94% with 2 L nasal cannula abnormal cardiac 105 sinus tach abnormal  Amount and/or Complexity of Data Reviewed Independent Historian: EMS External Data Reviewed: notes. Labs: ordered. Decision-making details documented in ED Course. Radiology: ordered and independent interpretation performed. Decision-making details documented in ED Course. ECG/medicine tests: ordered and independent interpretation performed. Decision-making details documented in ED Course.  Risk Prescription drug management. Decision regarding hospitalization. Diagnosis or treatment significantly limited by social determinants of health.  Initial lactate 3.1, leukocytosis 14,000.  Blood pressure appropriate, given tachycardia, lactic acidosis patient is receiving fluids, antibiotics.  3:56 PM Patient has improved after initial resuscitation with heart rate now below 100, now off continuous neb, but continues to require  oxygen.  Mentation now improved as well.  Patient's labs concerning for leukocytosis, lactic acidosis, as above concerning for sepsis.  Though she has improved, she continued lactic acidosis, in spite of initial fluid resuscitation.  Complication of evidence for heart failure with elevated BNP, abnormal chest x-ray requires judicious fluid resuscitation..  Given concern for sepsis, patient be admitted to critical care.  CRITICAL CARE Performed by: Lamar Salen Total critical care time: 40 minutes Critical care time was exclusive of separately billable procedures and treating other patients. Critical care was necessary to treat or prevent imminent or life-threatening deterioration. Critical care was time spent personally by me on the following activities: development of treatment plan with patient and/or surrogate as well as nursing, discussions with consultants, evaluation of patient's response to treatment, examination of patient, obtaining history from patient or surrogate, ordering and performing treatments and interventions, ordering and review of laboratory studies, ordering and review of radiographic studies, pulse oximetry and re-evaluation of patient's condition.   Final diagnoses:  Severe sepsis with acute organ dysfunction [facility])    ED Discharge Orders     None          Salen Lamar, MD 03/21/24 1558

## 2024-03-21 NOTE — Consult Note (Addendum)
 NAME:  GLADYCE MCRAY, MRN:  [id number], DOB:  May 13, 1961, LOS: 0 ADMISSION DATE:  03/21/2024, CONSULTATION DATE: 03/21/2024 REFERRING MD: ED, CHIEF COMPLAINT: Shortness of breath/sepsis  History of Present Illness:  Lisa Ibarra is a 63 year old female with a past medical history significant for type 2 diabetes, chronic pain, daily tobacco use,, and asthma who presented to the ED at [facility] on 10/30 via EMS for complaints of fever, cough, and generalized bodyaches.  On EMS arrival patient patient was found hypoxic with oxygen saturation 80% on room air.  On ED arrival patient was seen tachypneic, slightly tachycardic with appropriate oxygen saturations on nonrebreather.  Lab work on admission significant for creatinine 1.61 with GFR 53, BNP 2893, WBC 14.4, hemoglobin 18.6 with platelet 84, INR 1.3, lactic acid 3.1 > 3.0.  Chest x-ray concerning for mild vascular congestion.  Given persistently elevated lactate with new moderate oxygen requirement with acute concern for volume overload in the setting of poor cardiac output PCCM consulted for further management admission  Pertinent  Medical History   type 2 diabetes, chronic pain, daily tobacco use and asthma  Significant Hospital Events: Including procedures, antibiotic start and stop dates in addition to other pertinent events   10/30 presented to the ED with shortness of fever, cough, general body aches  Interim History / Subjective:  Seen sitting up on ED stretcher very lethargic  Objective    Blood pressure 109/68, pulse 100, temperature 99.1 F (37.3 C), temperature source Oral, resp. rate (!) 31, height 5' 9 (1.753 m), weight 127 kg, last menstrual period 06/12/2011, SpO2 95%.        Intake/Output Summary (Last 24 hours) at 03/21/2024 1532 Last data filed at 03/21/2024 1506 Gross per 24 hour  Intake 894.19 ml  Output --  Net 894.19 ml   Filed Weights   03/21/24 1252 03/21/24 1429  Weight: 127 kg 127 kg     Examination: General: Chronic ill-appearing middle-aged female sitting up in bed in no acute distress HEENT: [place]/AT, MM pink/moist, PERRL,  Neuro: Very lethargic but will arouse and to physical/light painful stimuli but quickly drifts back off to sleep, able to state name and location CV: s1s2 regular rate and rhythm, no murmur, rubs, or gallops,  PULM: Bilateral rhonchi throughout all lung fields, shallow respirations, requiring 4 L nasal cannula GI: soft, bowel sounds active in all 4 quadrants, non-tender, non-distended Extremities: warm/dry, no edema  Skin: no rashes or lesions  Resolved problem list   Assessment and Plan  Acute Hypoxic Respiratory Failure secondary to COPD exacerbation vs CAP  -Per report patient initially was found severely hypoxic with oxygen saturation 80% on room air per EMS.  On my assessment she was on 4 L nasal cannula Daily tobacco use  P: Continue supplemental oxygen for sat goal greater than 92  Check RVP panel Obtain a sputum culture as able Aspiration precautions N.p.o. Follow cultures Cessation education when appropriate  PO prednisone   Schedule BDs  Potentially evolving sepsis -Patient presented with mild tachypnea and tachycardia in the setting of lactic acid leukocytosis, and thrombocytopenia with AMS on admission.  P: Admit ICU Supplemental oxygen as above Start empiric IV ceftriaxone Check MRSA swab IV hydration provided on admission, will hold further IV fluids given evidence of volume overload Trend lactic acid Check procalcitonin Monitor urine output  Concern for underlying CHF -Patient has recently been experiencing bilateral lower extremity edema per outside hospital records and clinically on admission appears volume overloaded with bilateral pitting  edema.  Essential hypertension  P: Continuous telemetry  Collect and trend HS troponin  Strict intake and output  ECHO  Closely monitor renal function and electrolytes   Ensure hemodynamic control  Hold home meds   Acute Kidney Injury  -Creatinine on admit 1.16 with GFR 53 compared to creatinine 0.58 with GFR > 60 March 2024 P: Follow renal function  Monitor urine output Trend Bmet Avoid nephrotoxins Ensure adequate renal perfusion   Thrombocytopenia  -Platelets on admit 84, likely consumptive  P: Trend CBC  Hold on pharmacologic DVT prophylaxis  Will discuss with attending need for additional workup   Hypoalbuminemia  P: Consider RD consult to assess for possible protein calore malnutrition   Elevated INR  P: Trend   Labs   CBC: Recent Labs  Lab 03/21/24 1310  WBC 14.4*  NEUTROABS 13.4*  HGB 18.6*  HCT 59.4*  MCV 99.8  PLT 84*    Basic Metabolic Panel: Recent Labs  Lab 03/21/24 1310  NA 139  K 3.5  CL 104  CO2 20*  GLUCOSE 179*  BUN 19  CREATININE 1.16*  CALCIUM 9.2   GFR: Estimated Creatinine Clearance: 70.9 mL/min (A) (by C-G formula based on SCr of 1.16 mg/dL (H)). Recent Labs  Lab 03/21/24 1310 03/21/24 1353 03/21/24 1522  WBC 14.4*  --   --   LATICACIDVEN  --  3.1* 3.0*    Liver Function Tests: Recent Labs  Lab 03/21/24 1310  AST 26  ALT 12  ALKPHOS 83  BILITOT 1.3*  PROT 6.9  ALBUMIN 3.4*   No results for input(s): LIPASE, AMYLASE in the last 168 hours. No results for input(s): AMMONIA in the last 168 hours.  ABG No results found for: PHART, PCO2ART, PO2ART, HCO3, TCO2, ACIDBASEDEF, O2SAT   Coagulation Profile: Recent Labs  Lab 03/21/24 1310  INR 1.3*    Cardiac Enzymes: No results for input(s): CKTOTAL, CKMB, CKMBINDEX, TROPONINI in the last 168 hours.  HbA1C: Hgb A1c MFr Bld  Date/Time Value Ref Range Status  08/18/2021 04:29 PM 6.7 (H) 4.8 - 5.6 % Final    Comment:    (NOTE) Pre diabetes:          5.7%-6.4%  Diabetes:              >6.4%  Glycemic control for   <7.0% adults with diabetes   05/26/2012 05:20 PM 10.8 (H) <5.7 % Final     Comment:    (NOTE)                                                                       According to the ADA Clinical Practice Recommendations for 2011, when HbA1c is used as a screening test:  >=6.5%   Diagnostic of Diabetes Mellitus           (if abnormal result is confirmed) 5.7-6.4%   Increased risk of developing Diabetes Mellitus References:Diagnosis and Classification of Diabetes Mellitus,Diabetes Care,2011,34(Suppl 1):S62-S69 and Standards of Medical Care in         Diabetes - 2011,Diabetes Care,2011,34 (Suppl 1):S11-S61.    CBG: No results for input(s): GLUCAP in the last 168 hours.  Review of Systems:   Unable to assess   Past Medical History:  She,  has a past medical history of Asthma, Cancer (HCC), Cannabis abuse, Chronic pain, Diabetes mellitus type 2, controlled (HCC), and Hypertension.   Surgical History:   Past Surgical History:  Procedure Laterality Date   CESAREAN SECTION     JOINT REPLACEMENT     Left eye     TUBAL LIGATION     TUMOR REMOVAL from back       Social History:   reports that she has been smoking cigarettes. She has never used smokeless tobacco. She reports current drug use. Drug: Marijuana. She reports that she does not drink alcohol.   Family History:  Her family history includes Diabetes Mellitus II in her sister; Lung cancer in her father; Stroke in her mother.   Allergies Allergies  Allergen Reactions   Motrin [Ibuprofen] Nausea And Vomiting   Empagliflozin Itching and Rash     Home Medications  Prior to Admission medications   Medication Sig Start Date End Date Taking? Authorizing Provider  albuterol  (VENTOLIN  HFA) 108 (90 Base) MCG/ACT inhaler Inhale 2 puffs into the lungs every 6 (six) hours as needed for wheezing or shortness of breath. 08/22/21   Jerri Keys, MD  amLODipine  (NORVASC ) 5 MG tablet Take 1 tablet (5 mg total) by mouth daily. 02/23/22 03/25/22  Curatolo, Adam, DO  amoxicillin -clavulanate (AUGMENTIN ) 875-125 MG tablet  Take 1 tablet by mouth every 12 (twelve) hours. 02/01/22   Hildegard Loge, PA-C  ascorbic acid  (VITAMIN C) 500 MG tablet Take 1 tablet (500 mg total) by mouth daily. 08/22/21   Jerri Keys, MD  Aspirin-Acetaminophen -Caffeine  (GOODYS EXTRA STRENGTH) 500-325-65 MG PACK Take 1 packet by mouth daily as needed (pain).    [provider]  blood glucose meter kit and supplies KIT Dispense based on patient and insurance preference. Use up to four times daily as directed. 08/22/21   Jerri Keys, MD  doxycycline  (VIBRAMYCIN ) 100 MG capsule Take 1 capsule (100 mg total) by mouth 2 (two) times daily. 07/25/22   Robinson, John K, PA-C  glipiZIDE (GLUCOTROL) 10 MG tablet Take 10 mg by mouth 2 (two) times daily. 07/22/21   [provider]  hydrALAZINE  (APRESOLINE ) 10 MG tablet Take 1 tablet (10 mg total) by mouth every 8 (eight) hours as needed (for sbp> 170). 08/22/21   Jerri Keys, MD  insulin  aspart (NOVOLOG ) 100 UNIT/ML FlexPen Insulin  sliding scale: Blood sugar  120-150   3units                       151-200   4units                       201-250   7units                       251- 300  11units                       301-350   15uints                       351-400   20units                       >400         call MD immediately 08/22/21   Jerri Keys, MD  insulin  detemir (LEVEMIR ) 100 UNIT/ML FlexPen Inject 12 Units into the skin 2 (two) times daily. Stop using  levemir  insulin  if you am blood glucose less than 80 08/22/21   Jerri Keys, MD  Insulin  Pen Needle 29G X MISC For insulin  injection 08/22/21   Jerri Keys, MD  losartan  (COZAAR ) 50 MG tablet Take 2 tablets (100 mg total) by mouth daily. 08/22/21   Jerri Keys, MD  metFORMIN  (GLUCOPHAGE -XR) 500 MG 24 hr tablet Take 1,000 mg by mouth 2 (two) times daily. 06/23/21   [provider]  metoprolol  succinate (TOPROL -XL) 50 MG 24 hr tablet Take 1 tablet (50 mg total) by mouth at bedtime. Take with or immediately following a meal. 08/22/21   Jerri Keys, MD  predniSONE   (DELTASONE ) 10 MG tablet Please take prednisone  with breakfast Please take 2tabs on 4/3, then take 1tab on 4/4, then stop. 08/22/21   Jerri Keys, MD  zinc  sulfate 220 (50 Zn) MG capsule Take 1 capsule (220 mg total) by mouth daily. 08/22/21   Jerri Keys, MD     Critical care time:   CRITICAL CARE Performed by: Khloey Chern D. Harris  Total critical care time: 40 minutes  Critical care time was exclusive of separately billable procedures and treating other patients.  Critical care was necessary to treat or prevent imminent or life-threatening deterioration.  Critical care was time spent personally by me on the following activities: development of treatment plan with patient and/or surrogate as well as nursing, discussions with consultants, evaluation of patient's response to treatment, examination of patient, obtaining history from patient or surrogate, ordering and performing treatments and interventions, ordering and review of laboratory studies, ordering and review of radiographic studies, pulse oximetry and re-evaluation of patient's condition.  Carianne Taira D. Harris, NP-C [facility] Personal contact information can be found on Amion  If no contact or response made please call [phone] 03/21/2024, 4:38 PM

## 2024-03-21 NOTE — ED Triage Notes (Signed)
 Pt more awake, pt placed back on 4L via [place], pt satting 93%

## 2024-03-21 NOTE — ED Triage Notes (Signed)
 Pt to er room number 25 via ems per ems pt is here for a couple days of fever, cough and general body aches, pt is 80% on room air, pt placed on 4 L via [place], pt satting 83% on 4L, pt is drowsy, pt placed on nrb, 89 on NRB.  Ems states that they started an iv gave tylenol  650mg , zofran  4mg , soulumedrol 125 and albutertol 5mg  and 250 ml ns.

## 2024-03-21 NOTE — ED Triage Notes (Signed)
Rt at bedside,

## 2024-03-21 NOTE — Progress Notes (Deleted)
 See Consult note 10/30 for H&P

## 2024-03-21 NOTE — Sepsis Progress Note (Signed)
 Sepsis protocol monitored by eLink

## 2024-03-21 NOTE — H&P (Signed)
 See Consult note 10/30 for H&P

## 2024-03-22 ENCOUNTER — Inpatient Hospital Stay (HOSPITAL_COMMUNITY)

## 2024-03-22 DIAGNOSIS — I5031 Acute diastolic (congestive) heart failure: Secondary | ICD-10-CM | POA: Diagnosis not present

## 2024-03-22 DIAGNOSIS — I10 Essential (primary) hypertension: Secondary | ICD-10-CM

## 2024-03-22 DIAGNOSIS — E119 Type 2 diabetes mellitus without complications: Secondary | ICD-10-CM

## 2024-03-22 DIAGNOSIS — J9600 Acute respiratory failure, unspecified whether with hypoxia or hypercapnia: Secondary | ICD-10-CM

## 2024-03-22 LAB — URINALYSIS, W/ REFLEX TO CULTURE (INFECTION SUSPECTED)
Bilirubin Urine: NEGATIVE
Glucose, UA: NEGATIVE mg/dL
Ketones, ur: NEGATIVE mg/dL
Leukocytes,Ua: NEGATIVE
Nitrite: NEGATIVE
Protein, ur: NEGATIVE mg/dL
Specific Gravity, Urine: 1.006 (ref 1.005–1.030)
pH: 5 (ref 5.0–8.0)

## 2024-03-22 LAB — CBC
HCT: 53.5 % — ABNORMAL HIGH (ref 36.0–46.0)
Hemoglobin: 17 g/dL — ABNORMAL HIGH (ref 12.0–15.0)
MCH: 32.1 pg (ref 26.0–34.0)
MCHC: 31.8 g/dL (ref 30.0–36.0)
MCV: 100.9 fL — ABNORMAL HIGH (ref 80.0–100.0)
Platelets: 81 K/uL — ABNORMAL LOW (ref 150–400)
RBC: 5.3 MIL/uL — ABNORMAL HIGH (ref 3.87–5.11)
RDW: 14 % (ref 11.5–15.5)
WBC: 18.8 K/uL — ABNORMAL HIGH (ref 4.0–10.5)
nRBC: 0 % (ref 0.0–0.2)

## 2024-03-22 LAB — BLOOD CULTURE ID PANEL (REFLEXED) - BCID2
A.calcoaceticus-baumannii: NOT DETECTED
Bacteroides fragilis: NOT DETECTED
CTX-M ESBL: NOT DETECTED
Candida albicans: NOT DETECTED
Candida auris: NOT DETECTED
Candida glabrata: NOT DETECTED
Candida krusei: NOT DETECTED
Candida parapsilosis: NOT DETECTED
Candida tropicalis: NOT DETECTED
Carbapenem resist OXA 48 LIKE: NOT DETECTED
Carbapenem resistance IMP: NOT DETECTED
Carbapenem resistance KPC: NOT DETECTED
Carbapenem resistance NDM: NOT DETECTED
Carbapenem resistance VIM: NOT DETECTED
Cryptococcus neoformans/gattii: NOT DETECTED
Enterobacter cloacae complex: NOT DETECTED
Enterobacterales: DETECTED — AB
Enterococcus Faecium: NOT DETECTED
Enterococcus faecalis: NOT DETECTED
Escherichia coli: DETECTED — AB
Haemophilus influenzae: NOT DETECTED
Klebsiella aerogenes: NOT DETECTED
Klebsiella oxytoca: NOT DETECTED
Klebsiella pneumoniae: NOT DETECTED
Listeria monocytogenes: NOT DETECTED
Neisseria meningitidis: NOT DETECTED
Proteus species: NOT DETECTED
Pseudomonas aeruginosa: DETECTED — AB
Salmonella species: NOT DETECTED
Serratia marcescens: NOT DETECTED
Staphylococcus aureus (BCID): NOT DETECTED
Staphylococcus epidermidis: NOT DETECTED
Staphylococcus lugdunensis: NOT DETECTED
Staphylococcus species: NOT DETECTED
Stenotrophomonas maltophilia: NOT DETECTED
Streptococcus agalactiae: NOT DETECTED
Streptococcus pneumoniae: NOT DETECTED
Streptococcus pyogenes: NOT DETECTED
Streptococcus species: NOT DETECTED

## 2024-03-22 LAB — GLUCOSE, CAPILLARY
Glucose-Capillary: 188 mg/dL — ABNORMAL HIGH (ref 70–99)
Glucose-Capillary: 189 mg/dL — ABNORMAL HIGH (ref 70–99)
Glucose-Capillary: 195 mg/dL — ABNORMAL HIGH (ref 70–99)
Glucose-Capillary: 203 mg/dL — ABNORMAL HIGH (ref 70–99)
Glucose-Capillary: 216 mg/dL — ABNORMAL HIGH (ref 70–99)
Glucose-Capillary: 228 mg/dL — ABNORMAL HIGH (ref 70–99)
Glucose-Capillary: 300 mg/dL — ABNORMAL HIGH (ref 70–99)

## 2024-03-22 LAB — BASIC METABOLIC PANEL WITH GFR
Anion gap: 9 (ref 5–15)
BUN: 23 mg/dL (ref 8–23)
CO2: 25 mmol/L (ref 22–32)
Calcium: 8.9 mg/dL (ref 8.9–10.3)
Chloride: 104 mmol/L (ref 98–111)
Creatinine, Ser: 0.94 mg/dL (ref 0.44–1.00)
GFR, Estimated: >60 mL/min (ref >60)
Glucose, Bld: 193 mg/dL — ABNORMAL HIGH (ref 70–99)
Potassium: 4.1 mmol/L (ref 3.5–5.1)
Sodium: 138 mmol/L (ref 135–145)

## 2024-03-22 LAB — MAGNESIUM: Magnesium: 1.8 mg/dL (ref 1.7–2.4)

## 2024-03-22 LAB — RESPIRATORY PANEL BY PCR

## 2024-03-22 LAB — ECHOCARDIOGRAM COMPLETE
Area-P 1/2: 2.54 cm2
Height: 69 in
S' Lateral: 3.8 cm
Weight: 4589.1 [oz_av]

## 2024-03-22 LAB — STREP PNEUMONIAE URINARY ANTIGEN: Strep Pneumo Urinary Antigen: NEGATIVE

## 2024-03-22 LAB — HAPTOGLOBIN: Haptoglobin: 187 mg/dL (ref 37–355)

## 2024-03-22 LAB — PHOSPHORUS: Phosphorus: 3.4 mg/dL (ref 2.5–4.6)

## 2024-03-22 MED ORDER — MAGNESIUM SULFATE 2 GM/50ML IV SOLN
2.0000 g | Freq: Once | INTRAVENOUS | Status: AC
Start: 2024-03-22 — End: 2024-03-22
  Administered 2024-03-22: 2 g via INTRAVENOUS
  Filled 2024-03-22: qty 50

## 2024-03-22 MED ORDER — SODIUM CHLORIDE 0.9 % IV SOLN
2.0000 g | Freq: Three times a day (TID) | INTRAVENOUS | Status: DC
  Administered 2024-03-22 – 2024-03-24 (×6): 2 g via INTRAVENOUS
  Filled 2024-03-22 (×6): qty 12.5

## 2024-03-22 MED ORDER — ACETAMINOPHEN 325 MG PO TABS
650.0000 mg | ORAL_TABLET | Freq: Four times a day (QID) | ORAL | Status: DC | PRN
  Administered 2024-03-22 – 2024-03-24 (×3): 650 mg via ORAL
  Filled 2024-03-22 (×2): qty 2

## 2024-03-22 MED ORDER — POTASSIUM CHLORIDE CRYS ER 20 MEQ PO TBCR
40.0000 meq | EXTENDED_RELEASE_TABLET | Freq: Once | ORAL | Status: AC
  Administered 2024-03-22: 40 meq via ORAL
  Filled 2024-03-22: qty 2

## 2024-03-22 MED ORDER — SIMETHICONE 80 MG PO CHEW
80.0000 mg | CHEWABLE_TABLET | Freq: Four times a day (QID) | ORAL | Status: DC | PRN
  Administered 2024-03-22 – 2024-03-24 (×6): 80 mg via ORAL
  Filled 2024-03-22 (×6): qty 1

## 2024-03-22 MED ORDER — SODIUM CHLORIDE 0.9 % IV SOLN: 2.0000 g | INTRAVENOUS | Status: DC

## 2024-03-22 MED ORDER — ENOXAPARIN SODIUM 40 MG/0.4ML IJ SOSY
40.0000 mg | PREFILLED_SYRINGE | Freq: Every day | INTRAMUSCULAR | Status: DC
  Administered 2024-03-22 – 2024-03-24 (×3): 40 mg via SUBCUTANEOUS
  Filled 2024-03-22 (×3): qty 0.4

## 2024-03-22 MED ORDER — FUROSEMIDE 10 MG/ML IJ SOLN
40.0000 mg | Freq: Once | INTRAMUSCULAR | Status: AC
  Administered 2024-03-22: 40 mg via INTRAVENOUS
  Filled 2024-03-22: qty 4

## 2024-03-22 MED ORDER — METRONIDAZOLE 500 MG PO TABS: 500.0000 mg | ORAL_TABLET | Freq: Three times a day (TID) | ORAL | Status: DC

## 2024-03-22 MED ORDER — BUDESONIDE 0.5 MG/2ML IN SUSP
0.5000 mg | Freq: Two times a day (BID) | RESPIRATORY_TRACT | Status: DC
  Administered 2024-03-22 – 2024-03-24 (×5): 0.5 mg via RESPIRATORY_TRACT
  Filled 2024-03-22 (×5): qty 2

## 2024-03-22 MED ORDER — ONDANSETRON HCL 4 MG/2ML IJ SOLN
4.0000 mg | Freq: Four times a day (QID) | INTRAMUSCULAR | Status: AC | PRN
  Administered 2024-03-22 (×2): 4 mg via INTRAVENOUS
  Filled 2024-03-22 (×2): qty 2

## 2024-03-22 MED ORDER — METRONIDAZOLE 500 MG PO TABS
500.0000 mg | ORAL_TABLET | Freq: Two times a day (BID) | ORAL | Status: DC
  Administered 2024-03-22: 500 mg via ORAL
  Filled 2024-03-22: qty 1

## 2024-03-22 MED ORDER — ORAL CARE MOUTH RINSE: 15.0000 mL | OROMUCOSAL | Status: DC | PRN

## 2024-03-22 MED ORDER — NICOTINE 14 MG/24HR TD PT24
14.0000 mg | MEDICATED_PATCH | Freq: Every day | TRANSDERMAL | Status: DC
  Filled 2024-03-22 (×3): qty 1

## 2024-03-22 NOTE — Progress Notes (Signed)
 NAME:  Lisa Ibarra, MRN:  [id number], DOB:  06/25/1960, LOS: 1 ADMISSION DATE:  03/21/2024, CONSULTATION DATE: 03/21/2024 REFERRING MD: ED, CHIEF COMPLAINT: Shortness of breath/sepsis  History of Present Illness:  Lisa Ibarra is a 63 year old female with a past medical history significant for type 2 diabetes, chronic pain, daily tobacco use,, and asthma who presented to the ED at [facility] on 10/30 via EMS for complaints of fever, cough, and generalized bodyaches.  On EMS arrival patient patient was found hypoxic with oxygen saturation 80% on room air.  On ED arrival patient was seen tachypneic, slightly tachycardic with appropriate oxygen saturations on nonrebreather.  Lab work on admission significant for creatinine 1.61 with GFR 53, BNP 2893, WBC 14.4, hemoglobin 18.6 with platelet 84, INR 1.3, lactic acid 3.1 > 3.0.  Chest x-ray concerning for mild vascular congestion.  Given persistently elevated lactate with new moderate oxygen requirement with acute concern for volume overload in the setting of poor cardiac output PCCM consulted for further management admission  Pertinent  Medical History   type 2 diabetes, chronic pain, daily tobacco use and asthma  Significant Hospital Events: Including procedures, antibiotic start and stop dates in addition to other pertinent events   10/30 presented to the ED with shortness of fever, cough, general body aches 10/31 weaned off O2 to room air, doing well   Interim History / Subjective:  Feels a bit better. Weaned off O2 this AM and tolerating alright so far. Has had diarrhea on and off and per pt, her daughter found her at home covered in diarrhea prior to ED arrival.  Objective    Blood pressure (!) 144/76, pulse 86, temperature (!) 97.5 F (36.4 C), temperature source Axillary, resp. rate (!) 25, height 5' 9 (1.753 m), weight 130.1 kg, last menstrual period 06/12/2011, SpO2 100%.        Intake/Output Summary (Last 24 hours) at 03/22/2024  0808 Last data filed at 03/22/2024 9277 Gross per 24 hour  Intake 1476.93 ml  Output 400 ml  Net 1076.93 ml   Filed Weights   03/21/24 1252 03/21/24 1429 03/22/24 0500  Weight: 127 kg 127 kg 130.1 kg    Examination: General: Adult female, resting in bed, in NAD. Neuro: A&O x 3, no deficits. HEENT: [place]/AT. Sclerae anicteric. EOMI. Cardiovascular: RRR, no M/R/G.  Lungs: Respirations even and unlabored.  Faint crackles. Abdomen: Obese. BS x 4, soft, NT/ND.  Musculoskeletal: No gross deformities, no edema.    Assessment and Plan   Acute Hypoxic Respiratory Failure secondary to COPD exacerbation vs CAP - RVP negative, PCT high Daily tobacco use  P: Continue supplemental oxygen for sat goal greater than 92, currently weaned off Add U.Strep, U.Legionella Continue empiric CTX for now x 5 days Add Flagyl x 5 days Continue PO prednisone , BD's 40mg  Lasix  x 1 F/u on echo Aspiration precautions Nicotine patch Tobacco cessation counseling Needs outpatient follow up for establishment with pulmonary and for PFT's (was apparently supposed to have this in [place] but appointment was cancelled. I offered to get her setup with our office here in [place] considering she lives locally; however, she would like to discuss with her daughter first).  Concern for underlying CHF -Patient has recently been experiencing bilateral lower extremity edema per outside hospital records and clinically on admission appears volume overloaded with bilateral pitting edema.  Essential hypertension  P: F/u on echo 40 mg Lasxix 1 as above Strict intake and output  Hold home meds, will ask pharmacy  to do med rec for accuracy as several meds are listed as not taking Closely monitor renal function and electrolytes   Thrombocytopenia - new as was normal in 2023 and 2024. Presumed consumptive in setting acute illness. Fibrinogen high, Haptoglobin normal, Peripheral smear normal. P: Monitor for bleeding Defer  additional workup for now Follow CBC  Hx DM. P: SSI Hold PTA Ozempic  Diarrheal illness - No new meds or concern for food poisoning per pt report P: Check GI stool pathogen Add Flagyl given elevated PCT Consider further abdominal imaging depending on course but hold for now D/c Colace, Miralax   Stable for transfer to med surge. Will ask [facility] to assume care in AM 11/1 with PCCM off. Please call back if we can be of further assistance.   Sammi Gore, PA - [facility] For pager details, please see AMION or use Epic chat  After 1900, please call [facility] for cross coverage needs 03/22/2024, 8:08 AM

## 2024-03-22 NOTE — Progress Notes (Signed)
 Pt transferred to room 1528 from 2W. Agree with previous RN's assessment, will continue current plan of care.

## 2024-03-22 NOTE — Progress Notes (Signed)
  Echocardiogram 2D Echocardiogram has been performed.  Tinnie FORBES Gosling RDCS 03/22/2024, 8:59 AM

## 2024-03-22 NOTE — Progress Notes (Signed)
 eLink Physician-Brief Progress Note Patient Name: Lisa Ibarra DOB: 1960/08/21 MRN: [id number]   Date of Service  03/22/2024  HPI/Events of Note  20 pathogen panel and MRSA screen are negative. Patient needs a PRN order for pain / nausea. QTC 445 msec.  eICU Interventions  Droplet precautions discontinued. PRN Zofran  and Tylenol  ordered.        Lisa Ibarra 03/22/2024, 3:19 AM

## 2024-03-22 NOTE — Progress Notes (Signed)
 PHARMACY - PHYSICIAN COMMUNICATION CRITICAL VALUE ALERT - BLOOD CULTURE IDENTIFICATION (BCID)  Lisa Ibarra is an 63 y.o. female who presented to [facility] on 03/21/2024 with a chief complaint of fever, cough, body aches.  Assessment:  1 of 4 bottles, aerobic + for GNRs = E coli & Pseudomonas, no resistance on BCID panel  Name of physician (or Provider) Contacted: A Olalere in person  Current antibiotics: ceftriaxone, metronidazole  Changes to prescribed antibiotics recommended:  Change to cefepime 2 gm IV q8hrs   Results for orders placed or performed during the hospital encounter of 03/21/24  Blood Culture ID Panel (Reflexed) (Collected: 03/21/2024  1:10 PM)  Result Value Ref Range   Enterococcus faecalis NOT DETECTED NOT DETECTED   Enterococcus Faecium NOT DETECTED NOT DETECTED   Listeria monocytogenes NOT DETECTED NOT DETECTED   Staphylococcus species NOT DETECTED NOT DETECTED   Staphylococcus aureus (BCID) NOT DETECTED NOT DETECTED   Staphylococcus epidermidis NOT DETECTED NOT DETECTED   Staphylococcus lugdunensis NOT DETECTED NOT DETECTED   Streptococcus species NOT DETECTED NOT DETECTED   Streptococcus agalactiae NOT DETECTED NOT DETECTED   Streptococcus pneumoniae NOT DETECTED NOT DETECTED   Streptococcus pyogenes NOT DETECTED NOT DETECTED   A.calcoaceticus-baumannii NOT DETECTED NOT DETECTED   Bacteroides fragilis NOT DETECTED NOT DETECTED   Enterobacterales DETECTED (A) NOT DETECTED   Enterobacter cloacae complex NOT DETECTED NOT DETECTED   Escherichia coli DETECTED (A) NOT DETECTED   Klebsiella aerogenes NOT DETECTED NOT DETECTED   Klebsiella oxytoca NOT DETECTED NOT DETECTED   Klebsiella pneumoniae NOT DETECTED NOT DETECTED   Proteus species NOT DETECTED NOT DETECTED   Salmonella species NOT DETECTED NOT DETECTED   Serratia marcescens NOT DETECTED NOT DETECTED   Haemophilus influenzae NOT DETECTED NOT DETECTED   Neisseria meningitidis NOT DETECTED NOT DETECTED    Pseudomonas aeruginosa DETECTED (A) NOT DETECTED   Stenotrophomonas maltophilia NOT DETECTED NOT DETECTED   Candida albicans NOT DETECTED NOT DETECTED   Candida auris NOT DETECTED NOT DETECTED   Candida glabrata NOT DETECTED NOT DETECTED   Candida krusei NOT DETECTED NOT DETECTED   Candida parapsilosis NOT DETECTED NOT DETECTED   Candida tropicalis NOT DETECTED NOT DETECTED   Cryptococcus neoformans/gattii NOT DETECTED NOT DETECTED   CTX-M ESBL NOT DETECTED NOT DETECTED   Carbapenem resistance IMP NOT DETECTED NOT DETECTED   Carbapenem resistance KPC NOT DETECTED NOT DETECTED   Carbapenem resistance NDM NOT DETECTED NOT DETECTED   Carbapenem resist OXA 48 LIKE NOT DETECTED NOT DETECTED   Carbapenem resistance VIM NOT DETECTED NOT DETECTED     Rosaline IVAR Edison, Pharm.D Use secure chat for questions 03/22/2024 1:52 PM

## 2024-03-22 NOTE — Plan of Care (Signed)
  Problem: Education: Goal: Ability to describe self-care measures that may prevent or decrease complications (Diabetes Survival Skills Education) will improve Outcome: Progressing   Problem: Nutritional: Goal: Maintenance of adequate nutrition will improve Outcome: Progressing   Problem: Activity: Goal: Risk for activity intolerance will decrease Outcome: Progressing   Problem: Pain Managment: Goal: General experience of comfort will improve and/or be controlled Outcome: Progressing   Problem: Safety: Goal: Ability to remain free from injury will improve Outcome: Progressing   Problem: Skin Integrity: Goal: Risk for impaired skin integrity will decrease Outcome: Progressing

## 2024-03-22 NOTE — Inpatient Diabetes Management (Signed)
 Inpatient Diabetes Program Recommendations  AACE/ADA: New Consensus Statement on Inpatient Glycemic Control (2015)  Target Ranges:  Prepandial:   less than 140 mg/dL      Peak postprandial:   less than 180 mg/dL (1-2 hours)      Critically ill patients:  140 - 180 mg/dL   Lab Results  Component Value Date   GLUCAP 195 (H) 03/22/2024   HGBA1C 7.0 (H) 03/21/2024    Review of Glycemic Control  Latest Reference Range & Units 03/21/24 19:46 03/22/24 00:11 03/22/24 03:29 03/22/24 07:31  Glucose-Capillary 70 - 99 mg/dL 832 (H) 810 (H) 796 (H) 195 (H)  (H): Data is abnormally high Diabetes history: Type 2 DM Outpatient Diabetes medications: Ozempic 0-.25 mg qwk, Novolog  0-20 units TID, Levemir  12 units BID (NT) Current orders for Inpatient glycemic control: Novolog  0-15 units Q4H Prednisone  40 mg QD  Inpatient Diabetes Program Recommendations:    Consider adding Semglee  12 units every day  Thanks, Tinnie Minus, MSN, RNC-OB Diabetes Coordinator [phone] (8a-5p)

## 2024-03-22 NOTE — Progress Notes (Signed)
 [facility] ADULT ICU REPLACEMENT PROTOCOL   The patient does apply for the [facility] Adult ICU Electrolyte Replacment Protocol based on the criteria listed below:   1.Exclusion criteria: TCTS, ECMO, Dialysis, and Myasthenia Gravis patients 2. Is GFR >/= 30 ml/min? Yes.    Patient's GFR today is >60 3. Is SCr </= 2? Yes.   Patient's SCr is 0.94 mg/dL 4. Did SCr increase >/= 0.5 in 24 hours? No. 5.Pt's weight >40kg  Yes.   6. Abnormal electrolyte(s):   Mg 1.8    7. Electrolytes replaced per protocol 8.  Call MD STAT for K+ </= 2.5, Phos </= 1, or Mag </= 1 Physician:  CLEMENTEEN Epimenio Blackbird R Kamee Bobst 03/22/2024 5:43 AM

## 2024-03-23 DIAGNOSIS — J441 Chronic obstructive pulmonary disease with (acute) exacerbation: Secondary | ICD-10-CM | POA: Diagnosis present

## 2024-03-23 DIAGNOSIS — J449 Chronic obstructive pulmonary disease, unspecified: Secondary | ICD-10-CM | POA: Diagnosis present

## 2024-03-23 LAB — GLUCOSE, CAPILLARY
Glucose-Capillary: 143 mg/dL — ABNORMAL HIGH (ref 70–99)
Glucose-Capillary: 163 mg/dL — ABNORMAL HIGH (ref 70–99)
Glucose-Capillary: 164 mg/dL — ABNORMAL HIGH (ref 70–99)
Glucose-Capillary: 220 mg/dL — ABNORMAL HIGH (ref 70–99)
Glucose-Capillary: 239 mg/dL — ABNORMAL HIGH (ref 70–99)

## 2024-03-23 LAB — CBC
HCT: 52.1 % — ABNORMAL HIGH (ref 36.0–46.0)
Hemoglobin: 15.9 g/dL — ABNORMAL HIGH (ref 12.0–15.0)
MCH: 30.9 pg (ref 26.0–34.0)
MCHC: 30.5 g/dL (ref 30.0–36.0)
MCV: 101.4 fL — ABNORMAL HIGH (ref 80.0–100.0)
Platelets: 61 K/uL — ABNORMAL LOW (ref 150–400)
RBC: 5.14 MIL/uL — ABNORMAL HIGH (ref 3.87–5.11)
RDW: 13.7 % (ref 11.5–15.5)
WBC: 12.8 K/uL — ABNORMAL HIGH (ref 4.0–10.5)
nRBC: 0 % (ref 0.0–0.2)

## 2024-03-23 LAB — BASIC METABOLIC PANEL WITH GFR
Anion gap: 8 (ref 5–15)
BUN: 21 mg/dL (ref 8–23)
CO2: 27 mmol/L (ref 22–32)
Calcium: 9.3 mg/dL (ref 8.9–10.3)
Chloride: 103 mmol/L (ref 98–111)
Creatinine, Ser: 0.85 mg/dL (ref 0.44–1.00)
GFR, Estimated: >60 mL/min (ref >60)
Glucose, Bld: 157 mg/dL — ABNORMAL HIGH (ref 70–99)
Potassium: 4.5 mmol/L (ref 3.5–5.1)
Sodium: 137 mmol/L (ref 135–145)

## 2024-03-23 LAB — MAGNESIUM: Magnesium: 2.3 mg/dL (ref 1.7–2.4)

## 2024-03-23 LAB — LEGIONELLA PNEUMOPHILA SEROGP 1 UR AG: L. pneumophila Serogp 1 Ur Ag: NEGATIVE

## 2024-03-23 LAB — PHOSPHORUS: Phosphorus: 2.7 mg/dL (ref 2.5–4.6)

## 2024-03-23 NOTE — Evaluation (Signed)
 Occupational Therapy Evaluation Patient Details Name: Lisa Ibarra MRN: [id number] DOB: 30-Jun-1960 Today's Date: 03/23/2024   History of Present Illness   Lisa Ibarra is a 63 yr old female admitted to the hospital fever, cough, and body aches. She was found to have acute hypoxic respiratory failure, acute COPD exacerbation, and AKI. PMH: DM II, tobacco use     Clinical Impressions The pt is currently presenting below her baseline level of functioning for self-care management. She is limited by the below listed deficits (see OT problem list). As such, her occupational performance is compromised and she requires assistance for self-care management. At current, she requires CGA to min assist for tasks, including sit to stand, lower body dressing, and toileting at bedside commode level. During the session, she reported feelings of increased weakness, dizziness, shortness of breath with activity, and malaise; she attributes the dizziness to suspected vertigo, which she has a history of. She will benefit from further OT services to maximize her independence with self-care tasks and to decrease the risk for weakness and deconditioning. Home health OT is recommended.      If plan is discharge home, recommend the following:   Assistance with cooking/housework;A little help with bathing/dressing/bathroom     Functional Status Assessment   Patient has had a recent decline in their functional status and demonstrates the ability to make significant improvements in function in a reasonable and predictable amount of time.     Equipment Recommendations   Tub/shower seat     Recommendations for Other Services         Precautions/Restrictions   Precautions Precautions: Fall Restrictions Weight Bearing Restrictions Per Provider Order: No     Mobility Bed Mobility Overal bed mobility: Needs Assistance Bed Mobility: Supine to Sit     Supine to sit: Supervision           Transfers Overall transfer level: Needs assistance   Transfers: Sit to/from Stand, Bed to chair/wheelchair/BSC Sit to Stand: Contact guard assist Stand pivot transfers: Contact guard assist                Balance Overall balance assessment: Mild deficits observed, not formally tested             ADL either performed or assessed with clinical judgement   ADL Overall ADL's : Needs assistance/impaired Eating/Feeding: Independent;Sitting   Grooming: Set up;Sitting   Upper Body Bathing: Set up;Sitting;Supervision/ safety   Lower Body Bathing: Minimal assistance;Sitting/lateral leans   Upper Body Dressing : Set up;Supervision/safety;Sitting   Lower Body Dressing: Minimal assistance;Sitting/lateral leans   Toilet Transfer: BSC/3in1;Contact guard assist;Stand-pivot Toilet Transfer Details (indicate cue type and reason): Pt performed a stand-pivot transfer onto and off the bedside commode. Toileting- Clothing Manipulation and Hygiene: Contact guard assist;Sit to/from stand Toileting - Clothing Manipulation Details (indicate cue type and reason): The pt performed toileting at bedside commode level.             Vision   Additional Comments: She correctly read the time depicted on the wall clock.            Pertinent Vitals/Pain Pain Assessment Pain Assessment: 0-10 Pain Score: 8  Pain Location: abdomen Pain Intervention(s): Monitored during session, Limited activity within patient's tolerance, Repositioned     Extremity/Trunk Assessment Upper Extremity Assessment Upper Extremity Assessment: Generalized weakness;RUE deficits/detail;LUE deficits/detail RUE Deficits / Details: AROM WFL. Grip strength grossly 4-/5 LUE Deficits / Details: AROM WFL. Grip strength grossly 4-/5   Lower Extremity Assessment Lower Extremity  Assessment: RLE deficits/detail;LLE deficits/detail;Generalized weakness RLE Deficits / Details: AROM WFL LLE Deficits / Details: AROM WFL        Communication Communication Communication: No apparent difficulties   Cognition Arousal: Alert Behavior During Therapy: WFL for tasks assessed/performed               OT - Cognition Comments: Oriented x4                 Following commands: Intact       Cueing  General Comments   Cueing Techniques: Verbal cues              Home Living Family/patient expects to be discharged to:: Private residence Living Arrangements: Children (Daughter) Available Help at Discharge: Family Type of Home: Other(Comment) (Condo) Home Access: Level entry     Home Layout: One level     Bathroom Shower/Tub: Walk-in shower                    Prior Functioning/Environment Prior Level of Function : Independent/Modified Independent;Driving             Mobility Comments: She was independent with ambulation. ADLs Comments: She was independent with ADLs, driving, and sharing cooking & cleaning with her daughter. She works from home.    OT Problem List: Decreased strength;Decreased activity tolerance;Impaired balance (sitting and/or standing);Cardiopulmonary status limiting activity;Pain   OT Treatment/Interventions: Self-care/ADL training;Therapeutic exercise;Therapeutic activities;Energy conservation;DME and/or AE instruction;Patient/family education;Balance training      OT Goals(Current goals can be found in the care plan section)   Acute Rehab OT Goals Patient Stated Goal: to feel better OT Goal Formulation: With patient/family Time For Goal Achievement: 04/06/24 Potential to Achieve Goals: Good ADL Goals Pt Will Perform Grooming: with set-up;standing Pt Will Perform Lower Body Dressing: with set-up;with supervision;sit to/from stand;sitting/lateral leans Pt Will Transfer to Toilet: with supervision;ambulating Pt Will Perform Toileting - Clothing Manipulation and hygiene: with supervision;sit to/from stand   OT Frequency:  Min 2X/week       AM-PAC  OT 6 Clicks Daily Activity     Outcome Measure Help from another person eating meals?: None Help from another person taking care of personal grooming?: A Little Help from another person toileting, which includes using toliet, bedpan, or urinal?: A Little Help from another person bathing (including washing, rinsing, drying)?: A Little Help from another person to put on and taking off regular upper body clothing?: A Little Help from another person to put on and taking off regular lower body clothing?: A Little 6 Click Score: 19   End of Session Equipment Utilized During Treatment: Other (comment) (N/A) Nurse Communication: Mobility status  Activity Tolerance: Other (comment) (Fair tolerance) Patient left: in bed;with call bell/phone within reach;with family/visitor present  OT Visit Diagnosis: Muscle weakness (generalized) (M62.81);Pain Pain - part of body:  (abdomen)                Time: 8756-8690 OT Time Calculation (min): 26 min Charges:  OT General Charges $OT Visit: 1 Visit OT Evaluation $OT Eval Moderate Complexity: 1 Mod    Adriel Kessen J Harris, OTR/L 03/23/2024, 2:09 PM

## 2024-03-23 NOTE — Progress Notes (Signed)
 Micro lab informed us  that on 03/22/24 two of four blood culture bottles had GNR (BCID = Ecoli and PsA) and now (03/23/24) an additional bottle (3 of 4 bottles) has GNR. They will not do the BCID for this bottle since they already did it yesterday for the other two. The patient is on cefepime so no change in abx is needed at this time. Informed Dr. Lue of updated blood culture results.  [name], PharmD, BCPS 03/23/2024 12:44 PM

## 2024-03-23 NOTE — Progress Notes (Signed)
 Pt has stated that she is too weak to complete O2 walk test. O2 sat 97% on room air at rest.

## 2024-03-23 NOTE — Progress Notes (Signed)
 PROGRESS NOTE    Lisa Ibarra  FMW:[id number] DOB: Oct 10, 1960 DOA: 03/21/2024 PCP: Cesario Mutton, MD   Brief Narrative:  Lisa Ibarra is a 63 year old female with a past medical history significant for type 2 diabetes, chronic pain, daily tobacco use,, and asthma who presented to the ED at [facility] on 10/30 via EMS for complaints of fever, cough, shortness of breath, and generalized bodyaches.  Found to be markedly hypoxic in intake, given elevated lactic acid and profound oxygen needs patient was initially admitted by PCCM.  Transition to TRH service on 11/1.  Assessment & Plan:   Principal Problem:   COPD with acute exacerbation (HCC) Active Problems:   Essential hypertension   Diabetes mellitus (HCC)   Acute bronchitis   Thrombocytopenia   Acute respiratory failure with hypoxia (HCC)   Acute exacerbation of CHF (congestive heart failure) (HCC)  Acute Hypoxic Respiratory Failure  Secondary to COPD exacerbation Rule out bacterial pneumonia, community-acquired pneumonia  Daily tobacco use  - Able to wean off oxygen at rest today, ambulatory oxygen screen pending - Urine strep negative - Continue cefepime - Continue budesonide, prednisone , Brovana/Yupleri for COPD exacerbation - Echo EF 55 to 60% with grade 1 diastolic dysfunction - Outpatient PFTs previously planned for [facility] but appointment was cancelled -offered local follow-up   Grade 1 diastolic heart failure - Echo confirms - Currently appears more euvolemic than prior -hold further diuretics - Follow urine output   Acute thrombocytopenia Monitor for bleeding; follow outpatient for repeat labs, may be transient in the setting of above   Diabetes, non-insulin -dependent, uncontrolled with hyperglycemia - A1c 7.0 - Continue SSI - Hold PTA Ozempic   Diarrheal illness - No new meds or concern for food poisoning per pt report Resolved - hold further antibiotics Continue supportive care  DVT prophylaxis: enoxaparin   (LOVENOX ) injection 40 mg Start: 03/22/24 1000 SCDs Start: 03/21/24 1609   Code Status:   Code Status: Full Code Family Communication: None present Status is: Inpatient  Dispo: The patient is from: Home              Anticipated d/c is to: Home              Anticipated d/c date is: 24 to 48 hours              Patient currently not medically stable for discharge  Consultants:  PCCM  Procedures:  None  Antimicrobials:  Cefepime  Subjective: No acute issues or events overnight continues to complain of fatigue weakness and dyspnea with exertion, feels improved at rest but continues to be quite symptomatic with even minimal exertion to the bathroom.  Objective: Vitals:   03/22/24 1945 03/22/24 2000 03/22/24 2123 03/23/24 0418  BP:  (!) 114/59 118/63 114/74  Pulse: 85 87 81 78  Resp: (!) 22 (!) 22 (!) 21 19  Temp:   98.6 F (37 C) 98.4 F (36.9 C)  TempSrc:    Oral  SpO2: 94% 91%  92%  Weight:      Height:        Intake/Output Summary (Last 24 hours) at 03/23/2024 0755 Last data filed at 03/22/2024 1834 Gross per 24 hour  Intake 340 ml  Output 1050 ml  Net -710 ml   Filed Weights   03/21/24 1252 03/21/24 1429 03/22/24 0500  Weight: 127 kg 127 kg 130.1 kg    Examination:  General:  Pleasantly resting in bed, No acute distress. HEENT:  Normocephalic atraumatic.  Sclerae nonicteric,  noninjected.  Extraocular movements intact bilaterally. Neck:  Without mass or deformity.  Trachea is midline. Lungs: Diminished bilaterally without overt wheezes or rales. Heart:  Regular rate and rhythm.  Without murmurs, rubs, or gallops. Abdomen:  Soft, obese, nontender, nondistended.  Without guarding or rebound. Extremities: Without cyanosis, clubbing, edema Skin:  Warm and dry, no erythema.  Data Reviewed: I have personally reviewed following labs and imaging studies  CBC: Recent Labs  Lab 03/21/24 1310 03/21/24 1803 03/22/24 0313 03/23/24 0454  WBC 14.4* 20.1* 18.8*  12.8*  NEUTROABS 13.4* 18.8*  --   --   HGB 18.6* 17.9* 17.0* 15.9*  HCT 59.4* 57.0* 53.5* 52.1*  MCV 99.8 99.7 100.9* 101.4*  PLT 84* 86* 81* 61*   Basic Metabolic Panel: Recent Labs  Lab 03/21/24 1310 03/22/24 0313 03/23/24 0454  NA 139 138 137  K 3.5 4.1 4.5  CL 104 104 103  CO2 20* 25 27  GLUCOSE 179* 193* 157*  BUN 19 23 21   CREATININE 1.16* 0.94 0.85  CALCIUM 9.2 8.9 9.3  MG  --  1.8 2.3  PHOS  --  3.4 2.7   GFR: Estimated Creatinine Clearance: 98.2 mL/min (by C-G formula based on SCr of 0.85 mg/dL). Liver Function Tests: Recent Labs  Lab 03/21/24 1310  AST 26  ALT 12  ALKPHOS 83  BILITOT 1.3*  PROT 6.9  ALBUMIN 3.4*   No results for input(s): LIPASE, AMYLASE in the last 168 hours. No results for input(s): AMMONIA in the last 168 hours. Coagulation Profile: Recent Labs  Lab 03/21/24 1310  INR 1.3*   Cardiac Enzymes: No results for input(s): CKTOTAL, CKMB, CKMBINDEX, TROPONINI in the last 168 hours. BNP (last 3 results) Recent Labs    03/21/24 1310  PROBNP 2,893.0*   HbA1C: Recent Labs    03/21/24 1803  HGBA1C 7.0*   CBG: Recent Labs  Lab 03/22/24 1210 03/22/24 1624 03/22/24 2016 03/22/24 2334 03/23/24 0413  GLUCAP 216* 228* 300* 188* 143*   Lipid Profile: No results for input(s): CHOL, HDL, LDLCALC, TRIG, CHOLHDL, LDLDIRECT in the last 72 hours. Thyroid Function Tests: No results for input(s): TSH, T4TOTAL, FREET4, T3FREE, THYROIDAB in the last 72 hours. Anemia Panel: No results for input(s): VITAMINB12, FOLATE, FERRITIN, TIBC, IRON, RETICCTPCT in the last 72 hours. Sepsis Labs: Recent Labs  Lab 03/21/24 1353 03/21/24 1522 03/21/24 1803 03/21/24 1929  PROCALCITON  --   --  52.40  --   LATICACIDVEN 3.1* 3.0* 2.6* 2.3*    Recent Results (from the past 240 hours)  Resp panel by RT-PCR (RSV, Flu A&B, Covid) Anterior Nasal Swab     Status: None   Collection Time: 03/21/24  1:10 PM    Specimen: Anterior Nasal Swab  Result Value Ref Range Status   SARS Coronavirus 2 by RT PCR NEGATIVE NEGATIVE Final    Comment: (NOTE) SARS-CoV-2 target nucleic acids are NOT DETECTED.  The SARS-CoV-2 RNA is generally detectable in upper respiratory specimens during the acute phase of infection. The lowest concentration of SARS-CoV-2 viral copies this assay can detect is 138 copies/mL. A negative result does not preclude SARS-Cov-2 infection and should not be used as the sole basis for treatment or other patient management decisions. A negative result may occur with  improper specimen collection/handling, submission of specimen other than nasopharyngeal swab, presence of viral mutation(s) within the areas targeted by this assay, and inadequate number of viral copies(<138 copies/mL). A negative result must be combined with clinical observations, patient history, and  epidemiological information. The expected result is Negative.  Fact Sheet for Patients:  bloggercourse.com  Fact Sheet for Healthcare Providers:  seriousbroker.it  This test is no t yet approved or cleared by the [place]  FDA and  has been authorized for detection and/or diagnosis of SARS-CoV-2 by FDA under an Emergency Use Authorization (EUA). This EUA will remain  in effect (meaning this test can be used) for the duration of the COVID-19 declaration under Section 564(b)(1) of the Act, 21 U.S.C.section 360bbb-3(b)(1), unless the authorization is terminated  or revoked sooner.       Influenza A by PCR NEGATIVE NEGATIVE Final   Influenza B by PCR NEGATIVE NEGATIVE Final    Comment: (NOTE) The Xpert Xpress SARS-CoV-2/FLU/RSV plus assay is intended as an aid in the diagnosis of influenza from Nasopharyngeal swab specimens and should not be used as a sole basis for treatment. Nasal washings and aspirates are unacceptable for Xpert Xpress  SARS-CoV-2/FLU/RSV testing.  Fact Sheet for Patients: bloggercourse.com  Fact Sheet for Healthcare Providers: seriousbroker.it  This test is not yet approved or cleared by the [place]  FDA and has been authorized for detection and/or diagnosis of SARS-CoV-2 by FDA under an Emergency Use Authorization (EUA). This EUA will remain in effect (meaning this test can be used) for the duration of the COVID-19 declaration under Section 564(b)(1) of the Act, 21 U.S.C. section 360bbb-3(b)(1), unless the authorization is terminated or revoked.     Resp Syncytial Virus by PCR NEGATIVE NEGATIVE Final    Comment: (NOTE) Fact Sheet for Patients: bloggercourse.com  Fact Sheet for Healthcare Providers: seriousbroker.it  This test is not yet approved or cleared by the [place]  FDA and has been authorized for detection and/or diagnosis of SARS-CoV-2 by FDA under an Emergency Use Authorization (EUA). This EUA will remain in effect (meaning this test can be used) for the duration of the COVID-19 declaration under Section 564(b)(1) of the Act, 21 U.S.C. section 360bbb-3(b)(1), unless the authorization is terminated or revoked.  Performed at [facility], [address] [place], [place] [address]   Blood Culture (routine x 2)     Status: None (Preliminary result)   Collection Time: 03/21/24  1:10 PM   Specimen: BLOOD  Result Value Ref Range Status   Specimen Description   Final    BLOOD RIGHT ANTECUBITAL Performed at [facility], [address], [place], [place] [address]    Special Requests   Final    BOTTLES DRAWN AEROBIC AND ANAEROBIC Blood Culture results may not be optimal due to an inadequate volume of blood received in culture bottles Performed at [facility], [address], [place], [place] [address]    Culture    Final    NO GROWTH < 24 HOURS Performed at [facility], [address], [place], [place] [address]    Report Status PENDING  Incomplete  Blood Culture (routine x 2)     Status: None (Preliminary result)   Collection Time: 03/21/24  1:10 PM   Specimen: BLOOD  Result Value Ref Range Status   Specimen Description   Final    BLOOD LEFT ANTECUBITAL Performed at [facility], [address], [place], [place] [address]    Special Requests   Final    BOTTLES DRAWN AEROBIC AND ANAEROBIC Blood Culture results may not be optimal due to an inadequate volume of blood received in culture bottles Performed at [facility], [address] [place], [place] [address]    Culture  Setup Time   Final    GRAM NEGATIVE RODS IN BOTH AEROBIC AND ANAEROBIC BOTTLES CRITICAL RESULT CALLED TO, READ BACK BY AND VERIFIED WITH: PHARMD ADESOUA UTOMWEN ON 03/22/24 @ 1317 BY DRT Performed at [facility], [address], [place], [place] [address]    Culture GRAM NEGATIVE RODS  Final   Report Status PENDING  Incomplete  Respiratory (~20 pathogens) panel by PCR     Status: None   Collection Time: 03/21/24  1:10 PM   Specimen: Nasopharyngeal Swab; Respiratory  Result Value Ref Range Status   Adenovirus NOT DETECTED NOT DETECTED Final   Coronavirus 229E NOT DETECTED NOT DETECTED Final    Comment: (NOTE) The Coronavirus on the Respiratory Panel, DOES NOT test for the novel  Coronavirus (2019 nCoV)    Coronavirus HKU1 NOT DETECTED NOT DETECTED Final   Coronavirus NL63 NOT DETECTED NOT DETECTED Final   Coronavirus OC43 NOT DETECTED NOT DETECTED Final   Metapneumovirus NOT DETECTED NOT DETECTED Final   Rhinovirus / Enterovirus NOT DETECTED NOT DETECTED Final   Influenza A NOT DETECTED NOT DETECTED Final   Influenza B NOT DETECTED NOT DETECTED Final   Parainfluenza Virus 1 NOT DETECTED NOT DETECTED Final   Parainfluenza Virus 2 NOT DETECTED NOT DETECTED Final    Parainfluenza Virus 3 NOT DETECTED NOT DETECTED Final   Parainfluenza Virus 4 NOT DETECTED NOT DETECTED Final   Respiratory Syncytial Virus NOT DETECTED NOT DETECTED Final   Bordetella pertussis NOT DETECTED NOT DETECTED Final   Bordetella Parapertussis NOT DETECTED NOT DETECTED Final   Chlamydophila pneumoniae NOT DETECTED NOT DETECTED Final   Mycoplasma pneumoniae NOT DETECTED NOT DETECTED Final    Comment: Performed at [facility], [address] [place], [place] [address]  Blood Culture ID Panel (Reflexed)     Status: Abnormal   Collection Time: 03/21/24  1:10 PM  Result Value Ref Range Status   Enterococcus faecalis NOT DETECTED NOT DETECTED Final   Enterococcus Faecium NOT DETECTED NOT DETECTED Final   Listeria monocytogenes NOT DETECTED NOT DETECTED Final   Staphylococcus species NOT DETECTED NOT DETECTED Final   Staphylococcus aureus (BCID) NOT DETECTED NOT DETECTED Final   Staphylococcus epidermidis NOT DETECTED NOT DETECTED Final   Staphylococcus lugdunensis NOT DETECTED NOT DETECTED Final   Streptococcus species NOT DETECTED NOT DETECTED Final   Streptococcus agalactiae NOT DETECTED NOT DETECTED Final   Streptococcus pneumoniae NOT DETECTED NOT DETECTED Final   Streptococcus pyogenes NOT DETECTED NOT DETECTED Final   A.calcoaceticus-baumannii NOT DETECTED NOT DETECTED Final   Bacteroides fragilis NOT DETECTED NOT DETECTED Final   Enterobacterales DETECTED (A) NOT DETECTED Final    Comment: Enterobacterales represent a large order of gram negative bacteria, not a single organism. CRITICAL RESULT CALLED TO, READ BACK BY AND VERIFIED WITH: PHARMD ADESOUA UTOMWEN ON 03/22/24 @ 1346 BY DRT    Enterobacter cloacae complex NOT DETECTED NOT DETECTED Final   Escherichia coli DETECTED (A) NOT DETECTED Final    Comment: CRITICAL RESULT CALLED TO, READ BACK BY AND VERIFIED WITH: PHARMD ADESOUA UTOMWEN ON 03/22/24 @ 1346 BY DRT    Klebsiella aerogenes NOT DETECTED NOT DETECTED  Final   Klebsiella oxytoca NOT DETECTED NOT DETECTED Final   Klebsiella pneumoniae NOT DETECTED NOT DETECTED Final   Proteus species NOT DETECTED NOT DETECTED Final   Salmonella species NOT DETECTED NOT DETECTED Final   Serratia marcescens NOT DETECTED NOT DETECTED Final  Haemophilus influenzae NOT DETECTED NOT DETECTED Final   Neisseria meningitidis NOT DETECTED NOT DETECTED Final   Pseudomonas aeruginosa DETECTED (A) NOT DETECTED Final    Comment: CRITICAL RESULT CALLED TO, READ BACK BY AND VERIFIED WITH: PHARMD ADESOUA UTOMWEN ON 03/22/24 @ 1345 BY DRT    Stenotrophomonas maltophilia NOT DETECTED NOT DETECTED Final   Candida albicans NOT DETECTED NOT DETECTED Final   Candida auris NOT DETECTED NOT DETECTED Final   Candida glabrata NOT DETECTED NOT DETECTED Final   Candida krusei NOT DETECTED NOT DETECTED Final   Candida parapsilosis NOT DETECTED NOT DETECTED Final   Candida tropicalis NOT DETECTED NOT DETECTED Final   Cryptococcus neoformans/gattii NOT DETECTED NOT DETECTED Final   CTX-M ESBL NOT DETECTED NOT DETECTED Final   Carbapenem resistance IMP NOT DETECTED NOT DETECTED Final   Carbapenem resistance KPC NOT DETECTED NOT DETECTED Final   Carbapenem resistance NDM NOT DETECTED NOT DETECTED Final   Carbapenem resist OXA 48 LIKE NOT DETECTED NOT DETECTED Final   Carbapenem resistance VIM NOT DETECTED NOT DETECTED Final    Comment: Performed at [facility], [address] [place], [place] [address]  MRSA Next Gen by PCR, Nasal     Status: None   Collection Time: 03/21/24  5:49 PM   Specimen: Nasal Mucosa; Nasal Swab  Result Value Ref Range Status   MRSA by PCR Next Gen NOT DETECTED NOT DETECTED Final    Comment: (NOTE) The GeneXpert MRSA Assay (FDA approved for NASAL specimens only), is one component of a comprehensive MRSA colonization surveillance program. It is not intended to diagnose MRSA infection nor to guide or monitor treatment for MRSA infections. Test  performance is not FDA approved in patients less than 84 years old. Performed at [facility], [address] [place], [place] [address]          Radiology Studies: ECHOCARDIOGRAM COMPLETE Result Date: 03/22/2024    ECHOCARDIOGRAM REPORT   Patient Name:   Lisa Ibarra [facility] Date of Exam: 03/22/2024 Medical Rec #:  [id number]     Height:       69.0 in Accession #:    [id number]    Weight:       286.8 lb Date of Birth:  Oct 07, 1960     BSA:          2.408 m Patient Age:    63 years      BP:           144/76 mmHg Patient Gender: F             HR:           84 bpm. Exam Location:  Inpatient Procedure: 2D Echo, Color Doppler and Cardiac Doppler (Both Spectral and Color            Flow Doppler were utilized during procedure). Indications:    CHF I50.31  History:        Patient has no prior history of Echocardiogram examinations.                 Risk Factors:Diabetes and Hypertension.  Sonographer:    Tinnie Gosling RDCS Referring Phys: [id number] WHITNEY D HARRIS IMPRESSIONS  1. This was a very limited study due to poor acoustic windows and lack of echo contrast. In limited views, the LV systolic function appears preserved without focal wall motion abnormalities.  2. Left ventricular ejection fraction, by estimation, is 55 to 60%. The left ventricle has normal function. The  left ventricle has no regional wall motion abnormalities. Not well visualized left ventricular hypertrophy. Left ventricular diastolic parameters are consistent with Grade I diastolic dysfunction (impaired relaxation).  3. Right ventricular systolic function is normal. The right ventricular size is not well visualized. Tricuspid regurgitation signal is inadequate for assessing PA pressure.  4. The mitral valve is normal in structure. No evidence of mitral valve regurgitation. No evidence of mitral stenosis.  5. The aortic valve was not well visualized. Aortic valve regurgitation is not visualized. No aortic stenosis is present.  6.  The inferior vena cava is dilated in size with <50% respiratory variability, suggesting right atrial pressure of 15 mmHg. Comparison(s): No prior Echocardiogram. Conclusion(s)/Recommendation(s): Very limited study due to poor acoustic windows, but the LV systolic function appears normal. The RV was poorly visualized. Recommend repeat echocardiography with the use of echo contrast if better assessment of the RV function is necessary. FINDINGS  Left Ventricle: Left ventricular ejection fraction, by estimation, is 55 to 60%. The left ventricle has normal function. The left ventricle has no regional wall motion abnormalities. The left ventricular internal cavity size was normal in size. Not well  visualized left ventricular hypertrophy. Left ventricular diastolic parameters are consistent with Grade I diastolic dysfunction (impaired relaxation). Right Ventricle: The right ventricular size is not well visualized. Right vetricular wall thickness was not well visualized. Right ventricular systolic function is normal. Tricuspid regurgitation signal is inadequate for assessing PA pressure. Left Atrium: Left atrial size was normal in size. Right Atrium: Right atrial size was normal in size. Pericardium: There is no evidence of pericardial effusion. Mitral Valve: The mitral valve is normal in structure. No evidence of mitral valve regurgitation. No evidence of mitral valve stenosis. Tricuspid Valve: The tricuspid valve is not well visualized. Tricuspid valve regurgitation is not demonstrated. No evidence of tricuspid stenosis. Aortic Valve: The aortic valve was not well visualized. Aortic valve regurgitation is not visualized. No aortic stenosis is present. Pulmonic Valve: The pulmonic valve was not well visualized. Pulmonic valve regurgitation is not visualized. No evidence of pulmonic stenosis. Aorta: The aortic root and ascending aorta are structurally normal, with no evidence of dilitation. Venous: The inferior vena cava  is dilated in size with less than 50% respiratory variability, suggesting right atrial pressure of 15 mmHg. IAS/Shunts: The interatrial septum was not well visualized.  LEFT VENTRICLE PLAX 2D LVIDd:         5.00 cm   Diastology LVIDs:         3.80 cm   LV e' medial:    5.44 cm/s LV PW:         1.40 cm   LV E/e' medial:  17.4 LV IVS:        1.40 cm   LV e' lateral:   7.51 cm/s LVOT diam:     2.10 cm   LV E/e' lateral: 12.6 LV SV:         93 LV SV Index:   39 LVOT Area:     3.46 cm LV IVRT:       108 msec  RIGHT VENTRICLE             IVC RV S prime:     11.40 cm/s  IVC diam: 2.70 cm TAPSE (M-mode): 2.5 cm                             PULMONARY VEINS  Diastolic Velocity: 27.50 cm/s                             S/D Velocity:       1.00                             Systolic Velocity:  27.90 cm/s LEFT ATRIUM             Index        RIGHT ATRIUM           Index LA diam:        3.90 cm 1.62 cm/m   RA Area:     17.00 cm LA Vol (A2C):   32.1 ml 13.33 ml/m  RA Volume:   46.10 ml  19.14 ml/m LA Vol (A4C):   44.1 ml 18.31 ml/m LA Biplane Vol: 38.0 ml 15.78 ml/m  AORTIC VALVE LVOT Vmax:   125.00 cm/s LVOT Vmean:  99.800 cm/s LVOT VTI:    0.269 m  AORTA Ao Root diam: 3.30 cm Ao Asc diam:  3.30 cm MITRAL VALVE MV Area (PHT): 2.54 cm     SHUNTS MV Decel Time: 299 msec     Systemic VTI:  0.27 m MV E velocity: 94.70 cm/s   Systemic Diam: 2.10 cm MV A velocity: 114.00 cm/s MV E/A ratio:  0.83 Georganna Archer Electronically signed by Georganna Archer Signature Date/Time: 03/22/2024/12:54:44 PM    Final    DG Chest Port 1 View Result Date: 03/21/2024 CLINICAL DATA:  Possible sepsis.  Fever, cough and body aches. EXAM: PORTABLE CHEST 1 VIEW COMPARISON:  07/25/2022 FINDINGS: Lungs are somewhat hypoinflated without focal lobar consolidation or effusion. Subtle hazy prominence of the central pulmonary vessels likely due to mild vascular congestion. Cardiomediastinal silhouette and remainder of the exam is  unchanged. IMPRESSION: Suggestion of mild vascular congestion. Electronically Signed   By: Toribio Agreste M.D.   On: 03/21/2024 13:48        Scheduled Meds:  arformoterol  15 mcg Nebulization BID   budesonide (PULMICORT) nebulizer solution  0.5 mg Nebulization BID   Chlorhexidine Gluconate Cloth  6 each Topical Daily   enoxaparin  (LOVENOX ) injection  40 mg Subcutaneous Daily   insulin  aspart  0-15 Units Subcutaneous Q4H   nicotine  14 mg Transdermal Daily   predniSONE   40 mg Oral Q breakfast   revefenacin  175 mcg Nebulization Daily   Continuous Infusions:  ceFEPime (MAXIPIME) IV 2 g (03/23/24 0604)     LOS: 2 days   Time spent:  Elsie JAYSON Montclair, DO [facility]  If 7PM-7AM, please contact night-coverage [email or web address]  03/23/2024, 7:55 AM

## 2024-03-24 DIAGNOSIS — J441 Chronic obstructive pulmonary disease with (acute) exacerbation: Secondary | ICD-10-CM | POA: Diagnosis not present

## 2024-03-24 LAB — GLUCOSE, CAPILLARY
Glucose-Capillary: 128 mg/dL — ABNORMAL HIGH (ref 70–99)
Glucose-Capillary: 162 mg/dL — ABNORMAL HIGH (ref 70–99)
Glucose-Capillary: 182 mg/dL — ABNORMAL HIGH (ref 70–99)
Glucose-Capillary: 288 mg/dL — ABNORMAL HIGH (ref 70–99)

## 2024-03-24 MED ORDER — ARFORMOTEROL TARTRATE 15 MCG/2ML IN NEBU: 15.0000 ug | INHALATION_SOLUTION | Freq: Two times a day (BID) | RESPIRATORY_TRACT | 0 refills | Status: AC

## 2024-03-24 MED ORDER — METOPROLOL SUCCINATE ER 25 MG PO TB24: 12.5000 mg | ORAL_TABLET | Freq: Every day | ORAL | 0 refills | Status: DC

## 2024-03-24 MED ORDER — REVEFENACIN 175 MCG/3ML IN SOLN: 175.0000 ug | Freq: Every day | RESPIRATORY_TRACT | 0 refills | Status: AC

## 2024-03-24 MED ORDER — LOSARTAN POTASSIUM 25 MG PO TABS: 25.0000 mg | ORAL_TABLET | Freq: Every day | ORAL | Status: DC

## 2024-03-24 MED ORDER — PREDNISONE 10 MG PO TABS: ORAL_TABLET | ORAL | 0 refills | Status: DC

## 2024-03-24 MED ORDER — CEPHALEXIN 500 MG PO CAPS: 500.0000 mg | ORAL_CAPSULE | Freq: Four times a day (QID) | ORAL | 0 refills | Status: DC

## 2024-03-24 MED ORDER — FUROSEMIDE 20 MG PO TABS: 20.0000 mg | ORAL_TABLET | Freq: Every day | ORAL | Status: DC | PRN

## 2024-03-24 MED ORDER — BUDESONIDE 0.5 MG/2ML IN SUSP: 0.5000 mg | Freq: Two times a day (BID) | RESPIRATORY_TRACT | 0 refills | Status: AC

## 2024-03-24 MED ORDER — ATORVASTATIN CALCIUM 10 MG PO TABS
20.0000 mg | ORAL_TABLET | Freq: Every day | ORAL | Status: DC
Start: 2024-03-24 — End: 2024-03-24
  Administered 2024-03-24: 20 mg via ORAL
  Filled 2024-03-24 (×2): qty 2

## 2024-03-24 MED ORDER — LOSARTAN POTASSIUM 25 MG PO TABS
25.0000 mg | ORAL_TABLET | Freq: Every day | ORAL | Status: DC
  Administered 2024-03-24: 25 mg via ORAL
  Filled 2024-03-24 (×2): qty 1

## 2024-03-24 MED ORDER — METOPROLOL SUCCINATE ER 25 MG PO TB24: 12.5000 mg | ORAL_TABLET | Freq: Every day | ORAL | Status: DC

## 2024-03-24 MED ORDER — LOSARTAN POTASSIUM 50 MG PO TABS: 100.0000 mg | ORAL_TABLET | Freq: Every day | ORAL | Status: DC

## 2024-03-24 MED ORDER — LOSARTAN POTASSIUM 25 MG PO TABS: 25.0000 mg | ORAL_TABLET | Freq: Every day | ORAL | 0 refills | Status: DC

## 2024-03-24 NOTE — Evaluation (Signed)
 Physical Therapy Evaluation Patient Details Name: Lisa Ibarra MRN: [id number] DOB: 04-16-1961 Today's Date: 03/24/2024  History of Present Illness  63 yr old female admitted to the hospital fever, cough, and body aches. She was found to have acute hypoxic respiratory failure, acute COPD exacerbation, and AKI. PMH: DM II, tobacco use  Clinical Impression  On eval, pt required CGA-Min A for mobility. She ambulated ~65 feet with a RW. O2: 93% on RA at rest, 90% on RA with ambulation, dyspnea 2/4. There was some difficulty getting consistent O2 reading while mobilizing during session (likely due to fingernail polish). Discussed d/c plan-pt plans to return home. She is agreeable to HHPT f/u. Will also recommend a RW for ambulation safety. Will continue to follow pt during this hospital stay.         If plan is discharge home, recommend the following: A little help with walking and/or transfers;A little help with bathing/dressing/bathroom;Assistance with cooking/housework;Assist for transportation;Help with stairs or ramp for entrance   Can travel by private vehicle        Equipment Recommendations Rolling walker (2 wheels)  Recommendations for Other Services       Functional Status Assessment Patient has had a recent decline in their functional status and demonstrates the ability to make significant improvements in function in a reasonable and predictable amount of time.     Precautions / Restrictions Precautions Precautions: Fall Precaution/Restrictions Comments: monitor O2, HR Restrictions Weight Bearing Restrictions Per Provider Order: No      Mobility  Bed Mobility               General bed mobility comments: oob walking to bathroom with NT    Transfers Overall transfer level: Needs assistance Equipment used: Rolling walker (2 wheels) Transfers: Sit to/from Stand Sit to Stand: Contact guard assist           General transfer comment: Cues for safety, technique,  hand placement. Increased time.    Ambulation/Gait Ambulation/Gait assistance: Min assist, Contact guard assist Gait Distance (Feet): 65 Feet Assistive device: Rolling walker (2 wheels) Gait Pattern/deviations: Step-through pattern, Decreased stride length, Trunk flexed       General Gait Details: Intermittent unsteadiness requiring light steadying assist from therapist. Pt denied dizziness. Cues for safety, technique, RW positioning. Dyspnea 2/4. O2 90% on RA, HR 113 bpm  Stairs            Wheelchair Mobility     Tilt Bed    Modified Rankin (Stroke Patients Only)       Balance Overall balance assessment: Mild deficits observed, not formally tested                                           Pertinent Vitals/Pain Pain Assessment Pain Assessment: Faces Faces Pain Scale: Hurts little more Pain Location: abdomen Pain Descriptors / Indicators: Discomfort, Sore Pain Intervention(s): Monitored during session    Home Living Family/patient expects to be discharged to:: Private residence Living Arrangements: Children Available Help at Discharge: Family Type of Home: Apartment Home Access: Level entry       Home Layout: One level Home Equipment: None      Prior Function Prior Level of Function : Independent/Modified Independent             Mobility Comments: She was independent with ambulation. ADLs Comments: She was independent with ADLs, driving, and sharing  cooking & cleaning with her daughter. She works from home.     Extremity/Trunk Assessment   Upper Extremity Assessment Upper Extremity Assessment: Defer to OT evaluation    Lower Extremity Assessment Lower Extremity Assessment: Generalized weakness    Cervical / Trunk Assessment Cervical / Trunk Assessment: Normal  Communication   Communication Communication: No apparent difficulties    Cognition Arousal: Alert Behavior During Therapy: WFL for tasks assessed/performed    PT - Cognitive impairments: No apparent impairments                         Following commands: Intact       Cueing Cueing Techniques: Verbal cues     General Comments      Exercises     Assessment/Plan    PT Assessment Patient needs continued PT services  PT Problem List Decreased strength;Decreased activity tolerance;Decreased balance;Decreased mobility;Decreased knowledge of use of DME;Pain       PT Treatment Interventions DME instruction;Gait training;Functional mobility training;Therapeutic activities;Therapeutic exercise;Patient/family education;Balance training    PT Goals (Current goals can be found in the Care Plan section)  Acute Rehab PT Goals Patient Stated Goal: home soon. regain PLOF/independence PT Goal Formulation: With patient Time For Goal Achievement: 04/07/24 Potential to Achieve Goals: Good    Frequency Min 2X/week     Co-evaluation               AM-PAC PT 6 Clicks Mobility  Outcome Measure Help needed turning from your back to your side while in a flat bed without using bedrails?: A Little Help needed moving from lying on your back to sitting on the side of a flat bed without using bedrails?: A Little Help needed moving to and from a bed to a chair (including a wheelchair)?: A Little Help needed standing up from a chair using your arms (e.g., wheelchair or bedside chair)?: A Little Help needed to walk in hospital room?: A Little Help needed climbing 3-5 steps with a railing? : A Little 6 Click Score: 18    End of Session   Activity Tolerance: Patient tolerated treatment well;Patient limited by fatigue Patient left: in chair;with call bell/phone within reach   PT Visit Diagnosis: Unsteadiness on feet (R26.81);Difficulty in walking, not elsewhere classified (R26.2)    Time: 9041-8981 PT Time Calculation (min) (ACUTE ONLY): 20 min   Charges:   PT Evaluation $PT Eval Low Complexity: 1 Low   PT General Charges $$  ACUTE PT VISIT: 1 Visit            Dannial SQUIBB, PT Acute Rehabilitation  Office: [phone]

## 2024-03-24 NOTE — Discharge Summary (Signed)
 Physician Discharge Summary  Lisa Ibarra FMW:[id number] DOB: 1961-01-21 DOA: 03/21/2024  PCP: Cesario Mutton, MD  Admit date: 03/21/2024 Discharge date: 03/24/2024  Admitted From: Home Disposition: Home  Recommendations for Outpatient Follow-up:  Follow up with PCP in 1-2 weeks Follow-up with pulmonology as scheduled  Home Health: None Equipment/Devices: None  Discharge Condition: Stable CODE STATUS: Full Diet recommendation: Low-salt low-fat low-carb diet  Brief/Interim Summary: Lisa Ibarra is a 63 year old female with a past medical history significant for type 2 diabetes, chronic pain, daily tobacco use,, and asthma who presented to the ED at [facility] on 10/30 via EMS for complaints of fever, cough, shortness of breath, and generalized bodyaches.  Found to be markedly hypoxic in intake, given elevated lactic acid and profound oxygen needs patient was initially admitted by PCCM.  Transition to TRH service on 11/1.  Patient's respiratory status continues to improve as above, patient appears to have completed diuresis, with improving respiratory status on current regimen.  Echo shows grade 1 diastolic dysfunction and patient now appears euvolemic, discontinue diuretics and transition care to outpatient cardiology as scheduled.  Outpatient pulmonology scheduled in [place], PFTs will need to be repeated, patient would also benefit from sleep study given transient episodes of hypoxia while sleeping here.  Patient otherwise appears to be approaching baseline,    Discharge Diagnoses:  Principal Problem:   COPD with acute exacerbation (HCC) Active Problems:   Essential hypertension   Diabetes mellitus (HCC)   Acute bronchitis   Thrombocytopenia   Acute respiratory failure with hypoxia (HCC)   Acute exacerbation of CHF (congestive heart failure) (HCC)  Acute Hypoxic Respiratory Failure, resolving Secondary to COPD exacerbation, resolving Unlikely bacterial  pneumonia/community-acquired pneumonia  Daily tobacco use ongoing - Continues off oxygen at rest and with ambulation - Urine strep negative - Continue Keflex to complete antibiotic course  - Continue budesonide, prednisone , Brovana/Yupleri for COPD exacerbation - Echo EF 55 to 60% with grade 1 diastolic dysfunction - Outpatient PFTs previously planned for [facility] but appointment was cancelled - offered local follow-up   Grade 1 diastolic heart failure - Echo confirms - Currently appears more euvolemic than prior -hold further diuretics - Follow urine output   Acute thrombocytopenia - Monitor for bleeding; follow outpatient for repeat labs, may be transient in the setting of above   Diabetes, non-insulin -dependent, uncontrolled with hyperglycemia - A1c 7.0 - Resume Ozempic   Diarrheal illness - No new meds or concern for food poisoning per pt report - Resolved - hold further antibiotics - Continue supportive care  Discharge Instructions  Discharge Instructions     Call MD for:  difficulty breathing, headache or visual disturbances   Complete by: As directed    Call MD for:  extreme fatigue   Complete by: As directed    Call MD for:  hives   Complete by: As directed    Call MD for:  persistant dizziness or light-headedness   Complete by: As directed    Call MD for:  persistant nausea and vomiting   Complete by: As directed    Call MD for:  severe uncontrolled pain   Complete by: As directed    Call MD for:  temperature >100.4   Complete by: As directed    Diet - low sodium heart healthy   Complete by: As directed    Increase activity slowly   Complete by: As directed       Allergies as of 03/24/2024       Reactions  Bee Venom Anaphylaxis, Hives   Empagliflozin Itching, Rash, Dermatitis   Ibuprofen Nausea And Vomiting   Statins Nausea Only, Other (See Comments)   Muscle spasms and cramps, too        Medication List     STOP taking these medications     amLODipine  5 MG tablet Commonly known as: NORVASC        TAKE these medications    albuterol  108 (90 Base) MCG/ACT inhaler Commonly known as: VENTOLIN  HFA Inhale 2 puffs into the lungs every 6 (six) hours as needed for wheezing or shortness of breath.   arformoterol 15 MCG/2ML Nebu Commonly known as: BROVANA Take 2 mLs (15 mcg total) by nebulization 2 (two) times daily.   atorvastatin 20 MG tablet Commonly known as: LIPITOR Take 20 mg by mouth daily.   blood glucose meter kit and supplies Kit Dispense based on patient and insurance preference. Use up to four times daily as directed.   budesonide 0.5 MG/2ML nebulizer solution Commonly known as: PULMICORT Take 2 mLs (0.5 mg total) by nebulization 2 (two) times daily.   cephALEXin 500 MG capsule Commonly known as: KEFLEX Take 1 capsule (500 mg total) by mouth 4 (four) times daily for 8 days.   furosemide  20 MG tablet Commonly known as: LASIX  Take 20-40 mg by mouth daily as needed for fluid or edema.   [employer] Extra Strength [id number] MG Pack Generic drug: Aspirin-Acetaminophen -Caffeine  Take 1 packet by mouth See admin instructions. Dissolve 1 packet in the mouth up to six times a day as needed for pain   Insulin  Pen Needle 29G X Misc For insulin  injection   losartan  25 MG tablet Commonly known as: COZAAR  Take 1 tablet (25 mg total) by mouth daily. What changed:  medication strength how much to take   metoprolol  succinate 25 MG 24 hr tablet Commonly known as: TOPROL -XL Take 0.5 tablets (12.5 mg total) by mouth at bedtime. What changed:  medication strength how much to take additional instructions   Ozempic (0.25 or 0.5 MG/DOSE) 2 MG/3ML Sopn Generic drug: Semaglutide(0.25 or 0.5MG /DOS) Inject 0.25 mg into the skin every Sunday.   potassium chloride  SA 20 MEQ tablet Commonly known as: KLOR-CON  M Take 20 mEq by mouth daily.   predniSONE  10 MG tablet Commonly known as: DELTASONE  Take 4 tablets (40 mg  total) by mouth daily for 3 days, THEN 3 tablets (30 mg total) daily for 3 days, THEN 2 tablets (20 mg total) daily for 3 days, THEN 1 tablet (10 mg total) daily for 3 days. Start taking on: March 24, 2024   revefenacin 175 MCG/3ML nebulizer solution Commonly known as: YUPELRI Take 3 mLs (175 mcg total) by nebulization daily. Start taking on: March 25, 2024        Contact information for after-discharge care     Home Medical Care     [facility]) .   Service: Home Health Services Contact information: [address] [place]  [address] [phone]                    Allergies  Allergen Reactions   Bee Venom Anaphylaxis and Hives   Empagliflozin Itching, Rash and Dermatitis   Ibuprofen Nausea And Vomiting   Statins Nausea Only and Other (See Comments)    Muscle spasms and cramps, too    Consultations: PCCM  Procedures/Studies: ECHOCARDIOGRAM COMPLETE Result Date: 03/22/2024    ECHOCARDIOGRAM REPORT   Patient Name:  Lisa Ibarra Date of Exam: 03/22/2024 Medical Rec #:  [id number]     Height:       69.0 in Accession #:    [id number]    Weight:       286.8 lb Date of Birth:  October 25, 1960     BSA:          2.408 m Patient Age:    63 years      BP:           144/76 mmHg Patient Gender: F             HR:           84 bpm. Exam Location:  Inpatient Procedure: 2D Echo, Color Doppler and Cardiac Doppler (Both Spectral and Color            Flow Doppler were utilized during procedure). Indications:    CHF I50.31  History:        Patient has no prior history of Echocardiogram examinations.                 Risk Factors:Diabetes and Hypertension.  Sonographer:    Tinnie Gosling RDCS Referring Phys: [phone] WHITNEY D HARRIS IMPRESSIONS  1. This was a very limited study due to poor acoustic windows and lack of echo contrast. In limited views, the LV systolic function appears preserved without focal wall motion abnormalities.   2. Left ventricular ejection fraction, by estimation, is 55 to 60%. The left ventricle has normal function. The left ventricle has no regional wall motion abnormalities. Not well visualized left ventricular hypertrophy. Left ventricular diastolic parameters are consistent with Grade I diastolic dysfunction (impaired relaxation).  3. Right ventricular systolic function is normal. The right ventricular size is not well visualized. Tricuspid regurgitation signal is inadequate for assessing PA pressure.  4. The mitral valve is normal in structure. No evidence of mitral valve regurgitation. No evidence of mitral stenosis.  5. The aortic valve was not well visualized. Aortic valve regurgitation is not visualized. No aortic stenosis is present.  6. The inferior vena cava is dilated in size with <50% respiratory variability, suggesting right atrial pressure of 15 mmHg. Comparison(s): No prior Echocardiogram. Conclusion(s)/Recommendation(s): Very limited study due to poor acoustic windows, but the LV systolic function appears normal. The RV was poorly visualized. Recommend repeat echocardiography with the use of echo contrast if better assessment of the RV function is necessary. FINDINGS  Left Ventricle: Left ventricular ejection fraction, by estimation, is 55 to 60%. The left ventricle has normal function. The left ventricle has no regional wall motion abnormalities. The left ventricular internal cavity size was normal in size. Not well  visualized left ventricular hypertrophy. Left ventricular diastolic parameters are consistent with Grade I diastolic dysfunction (impaired relaxation). Right Ventricle: The right ventricular size is not well visualized. Right vetricular wall thickness was not well visualized. Right ventricular systolic function is normal. Tricuspid regurgitation signal is inadequate for assessing PA pressure. Left Atrium: Left atrial size was normal in size. Right Atrium: Right atrial size was normal in  size. Pericardium: There is no evidence of pericardial effusion. Mitral Valve: The mitral valve is normal in structure. No evidence of mitral valve regurgitation. No evidence of mitral valve stenosis. Tricuspid Valve: The tricuspid valve is not well visualized. Tricuspid valve regurgitation is not demonstrated. No evidence of tricuspid stenosis. Aortic Valve: The aortic valve was not well visualized. Aortic valve regurgitation is not visualized. No aortic stenosis is present. Pulmonic Valve: The pulmonic  valve was not well visualized. Pulmonic valve regurgitation is not visualized. No evidence of pulmonic stenosis. Aorta: The aortic root and ascending aorta are structurally normal, with no evidence of dilitation. Venous: The inferior vena cava is dilated in size with less than 50% respiratory variability, suggesting right atrial pressure of 15 mmHg. IAS/Shunts: The interatrial septum was not well visualized.  LEFT VENTRICLE PLAX 2D LVIDd:         5.00 cm   Diastology LVIDs:         3.80 cm   LV e' medial:    5.44 cm/s LV PW:         1.40 cm   LV E/e' medial:  17.4 LV IVS:        1.40 cm   LV e' lateral:   7.51 cm/s LVOT diam:     2.10 cm   LV E/e' lateral: 12.6 LV SV:         93 LV SV Index:   39 LVOT Area:     3.46 cm LV IVRT:       108 msec  RIGHT VENTRICLE             IVC RV S prime:     11.40 cm/s  IVC diam: 2.70 cm TAPSE (M-mode): 2.5 cm                             PULMONARY VEINS                             Diastolic Velocity: 27.50 cm/s                             S/D Velocity:       1.00                             Systolic Velocity:  27.90 cm/s LEFT ATRIUM             Index        RIGHT ATRIUM           Index LA diam:        3.90 cm 1.62 cm/m   RA Area:     17.00 cm LA Vol (A2C):   32.1 ml 13.33 ml/m  RA Volume:   46.10 ml  19.14 ml/m LA Vol (A4C):   44.1 ml 18.31 ml/m LA Biplane Vol: 38.0 ml 15.78 ml/m  AORTIC VALVE LVOT Vmax:   125.00 cm/s LVOT Vmean:  99.800 cm/s LVOT VTI:    0.269 m  AORTA Ao  Root diam: 3.30 cm Ao Asc diam:  3.30 cm MITRAL VALVE MV Area (PHT): 2.54 cm     SHUNTS MV Decel Time: 299 msec     Systemic VTI:  0.27 m MV E velocity: 94.70 cm/s   Systemic Diam: 2.10 cm MV A velocity: 114.00 cm/s MV E/A ratio:  0.83 Georganna Archer Electronically signed by Georganna Archer Signature Date/Time: 03/22/2024/12:54:44 PM    Final    DG Chest Port 1 View Result Date: 03/21/2024 CLINICAL DATA:  Possible sepsis.  Fever, cough and body aches. EXAM: PORTABLE CHEST 1 VIEW COMPARISON:  07/25/2022 FINDINGS: Lungs are somewhat hypoinflated without focal lobar consolidation or effusion. Subtle hazy prominence of the central pulmonary vessels likely due to mild vascular congestion.  Cardiomediastinal silhouette and remainder of the exam is unchanged. IMPRESSION: Suggestion of mild vascular congestion. Electronically Signed   By: Toribio Agreste M.D.   On: 03/21/2024 13:48   Subjective: No acute issues/events overnight   Discharge Exam: Vitals:   03/24/24 0647 03/24/24 0818  BP:    Pulse: 94   Resp:    Temp:    SpO2: 100% 94%   Vitals:   03/23/24 2229 03/24/24 0418 03/24/24 0647 03/24/24 0818  BP:  (!) 149/72    Pulse:  84 94   Resp:  16    Temp:  98.4 F (36.9 C)    TempSrc:      SpO2: 100% (!) 88% 100% 94%  Weight:      Height:        General: Pt is alert, awake, not in acute distress Cardiovascular: RRR, S1/S2 +, no rubs, no gallops Respiratory: CTA bilaterally, no wheezing, no rhonchi Abdominal: Soft, obese, NT, ND, bowel sounds + Extremities: scant BLE edema, no cyanosis    The results of significant diagnostics from this hospitalization (including imaging, microbiology, ancillary and laboratory) are listed below for reference.     Microbiology: Recent Results (from the past 240 hours)  Resp panel by RT-PCR (RSV, Flu A&B, Covid) Anterior Nasal Swab     Status: None   Collection Time: 03/21/24  1:10 PM   Specimen: Anterior Nasal Swab  Result Value Ref Range Status    SARS Coronavirus 2 by RT PCR NEGATIVE NEGATIVE Final    Comment: (NOTE) SARS-CoV-2 target nucleic acids are NOT DETECTED.  The SARS-CoV-2 RNA is generally detectable in upper respiratory specimens during the acute phase of infection. The lowest concentration of SARS-CoV-2 viral copies this assay can detect is 138 copies/mL. A negative result does not preclude SARS-Cov-2 infection and should not be used as the sole basis for treatment or other patient management decisions. A negative result may occur with  improper specimen collection/handling, submission of specimen other than nasopharyngeal swab, presence of viral mutation(s) within the areas targeted by this assay, and inadequate number of viral copies(<138 copies/mL). A negative result must be combined with clinical observations, patient history, and epidemiological information. The expected result is Negative.  Fact Sheet for Patients:  bloggercourse.com  Fact Sheet for Healthcare Providers:  seriousbroker.it  This test is no t yet approved or cleared by the [place]  FDA and  has been authorized for detection and/or diagnosis of SARS-CoV-2 by FDA under an Emergency Use Authorization (EUA). This EUA will remain  in effect (meaning this test can be used) for the duration of the COVID-19 declaration under Section 564(b)(1) of the Act, 21 U.S.C.section 360bbb-3(b)(1), unless the authorization is terminated  or revoked sooner.       Influenza A by PCR NEGATIVE NEGATIVE Final   Influenza B by PCR NEGATIVE NEGATIVE Final    Comment: (NOTE) The Xpert Xpress SARS-CoV-2/FLU/RSV plus assay is intended as an aid in the diagnosis of influenza from Nasopharyngeal swab specimens and should not be used as a sole basis for treatment. Nasal washings and aspirates are unacceptable for Xpert Xpress SARS-CoV-2/FLU/RSV testing.  Fact Sheet for  Patients: bloggercourse.com  Fact Sheet for Healthcare Providers: seriousbroker.it  This test is not yet approved or cleared by the [place]  FDA and has been authorized for detection and/or diagnosis of SARS-CoV-2 by FDA under an Emergency Use Authorization (EUA). This EUA will remain in effect (meaning this test can be used) for the duration of the COVID-19 declaration  under Section 564(b)(1) of the Act, 21 U.S.C. section 360bbb-3(b)(1), unless the authorization is terminated or revoked.     Resp Syncytial Virus by PCR NEGATIVE NEGATIVE Final    Comment: (NOTE) Fact Sheet for Patients: bloggercourse.com  Fact Sheet for Healthcare Providers: seriousbroker.it  This test is not yet approved or cleared by the [place]  FDA and has been authorized for detection and/or diagnosis of SARS-CoV-2 by FDA under an Emergency Use Authorization (EUA). This EUA will remain in effect (meaning this test can be used) for the duration of the COVID-19 declaration under Section 564(b)(1) of the Act, 21 U.S.C. section 360bbb-3(b)(1), unless the authorization is terminated or revoked.  Performed at [facility], [address], [place], [place] [address]   Blood Culture (routine x 2)     Status: None (Preliminary result)   Collection Time: 03/21/24  1:10 PM   Specimen: BLOOD  Result Value Ref Range Status   Specimen Description   Final    BLOOD RIGHT ANTECUBITAL Performed at [facility], [address] [place], [place] [address]    Special Requests   Final    BOTTLES DRAWN AEROBIC AND ANAEROBIC Blood Culture results may not be optimal due to an inadequate volume of blood received in culture bottles Performed at [facility], [address], [place], [place] [address]    Culture  Setup Time   Final    GRAM NEGATIVE RODS AEROBIC  BOTTLE ONLY CRITICAL VALUE NOTED.  VALUE IS CONSISTENT WITH PREVIOUSLY REPORTED AND CALLED VALUE.    Culture   Final    GRAM NEGATIVE RODS CULTURE REINCUBATED FOR BETTER GROWTH Performed at [facility], [address], [place], [place] [address]    Report Status PENDING  Incomplete  Blood Culture (routine x 2)     Status: None (Preliminary result)   Collection Time: 03/21/24  1:10 PM   Specimen: BLOOD  Result Value Ref Range Status   Specimen Description   Final    BLOOD LEFT ANTECUBITAL Performed at [facility], [address], [place], [place] [address]    Special Requests   Final    BOTTLES DRAWN AEROBIC AND ANAEROBIC Blood Culture results may not be optimal due to an inadequate volume of blood received in culture bottles Performed at [facility], [address] [place], [place] [address]    Culture  Setup Time   Final    GRAM NEGATIVE RODS IN BOTH AEROBIC AND ANAEROBIC BOTTLES CRITICAL RESULT CALLED TO, READ BACK BY AND VERIFIED WITH: PHARMD ADESOUA UTOMWEN ON 03/22/24 @ 1317 BY DRT    Culture   Final    Two isolates with different morphologies were identified as the same organism.The most resistant organism was reported. ESCHERICHIA COLI PSEUDOMONAS AERUGINOSA SUSCEPTIBILITIES TO FOLLOW Performed at [facility], [address] [place], [place] [address]    Report Status PENDING  Incomplete   Organism ID, Bacteria ESCHERICHIA COLI  Final      Susceptibility   Escherichia coli - MIC*    AMPICILLIN 4 SENSITIVE Sensitive     CEFAZOLIN (NON-URINE) <=1 SENSITIVE Sensitive     CEFEPIME <=0.12 SENSITIVE Sensitive     ERTAPENEM <=0.12 SENSITIVE Sensitive     CEFTRIAXONE <=0.25 SENSITIVE Sensitive     CIPROFLOXACIN <=0.06 SENSITIVE Sensitive     GENTAMICIN <=1 SENSITIVE Sensitive     MEROPENEM <=0.25 SENSITIVE Sensitive     TRIMETH/SULFA <=20 SENSITIVE Sensitive  AMPICILLIN/SULBACTAM <=2 SENSITIVE Sensitive      PIP/TAZO Value in next row Sensitive      <=4 SENSITIVEThis is a modified FDA-approved test that has been validated and its performance characteristics determined by the reporting laboratory.  This laboratory is certified under the Clinical Laboratory Improvement Amendments CLIA as qualified to perform high complexity clinical laboratory testing.    * ESCHERICHIA COLI  Respiratory (~20 pathogens) panel by PCR     Status: None   Collection Time: 03/21/24  1:10 PM   Specimen: Nasopharyngeal Swab; Respiratory  Result Value Ref Range Status   Adenovirus NOT DETECTED NOT DETECTED Final   Coronavirus 229E NOT DETECTED NOT DETECTED Final    Comment: (NOTE) The Coronavirus on the Respiratory Panel, DOES NOT test for the novel  Coronavirus (2019 nCoV)    Coronavirus HKU1 NOT DETECTED NOT DETECTED Final   Coronavirus NL63 NOT DETECTED NOT DETECTED Final   Coronavirus OC43 NOT DETECTED NOT DETECTED Final   Metapneumovirus NOT DETECTED NOT DETECTED Final   Rhinovirus / Enterovirus NOT DETECTED NOT DETECTED Final   Influenza A NOT DETECTED NOT DETECTED Final   Influenza B NOT DETECTED NOT DETECTED Final   Parainfluenza Virus 1 NOT DETECTED NOT DETECTED Final   Parainfluenza Virus 2 NOT DETECTED NOT DETECTED Final   Parainfluenza Virus 3 NOT DETECTED NOT DETECTED Final   Parainfluenza Virus 4 NOT DETECTED NOT DETECTED Final   Respiratory Syncytial Virus NOT DETECTED NOT DETECTED Final   Bordetella pertussis NOT DETECTED NOT DETECTED Final   Bordetella Parapertussis NOT DETECTED NOT DETECTED Final   Chlamydophila pneumoniae NOT DETECTED NOT DETECTED Final   Mycoplasma pneumoniae NOT DETECTED NOT DETECTED Final    Comment: Performed at [facility], [address], [place], [place] [address]  Blood Culture ID Panel (Reflexed)     Status: Abnormal   Collection Time: 03/21/24  1:10 PM  Result Value Ref Range Status   Enterococcus faecalis NOT DETECTED NOT DETECTED Final   Enterococcus Faecium  NOT DETECTED NOT DETECTED Final   Listeria monocytogenes NOT DETECTED NOT DETECTED Final   Staphylococcus species NOT DETECTED NOT DETECTED Final   Staphylococcus aureus (BCID) NOT DETECTED NOT DETECTED Final   Staphylococcus epidermidis NOT DETECTED NOT DETECTED Final   Staphylococcus lugdunensis NOT DETECTED NOT DETECTED Final   Streptococcus species NOT DETECTED NOT DETECTED Final   Streptococcus agalactiae NOT DETECTED NOT DETECTED Final   Streptococcus pneumoniae NOT DETECTED NOT DETECTED Final   Streptococcus pyogenes NOT DETECTED NOT DETECTED Final   A.calcoaceticus-baumannii NOT DETECTED NOT DETECTED Final   Bacteroides fragilis NOT DETECTED NOT DETECTED Final   Enterobacterales DETECTED (A) NOT DETECTED Final    Comment: Enterobacterales represent a large order of gram negative bacteria, not a single organism. CRITICAL RESULT CALLED TO, READ BACK BY AND VERIFIED WITH: PHARMD ADESOUA UTOMWEN ON 03/22/24 @ 1346 BY DRT    Enterobacter cloacae complex NOT DETECTED NOT DETECTED Final   Escherichia coli DETECTED (A) NOT DETECTED Final    Comment: CRITICAL RESULT CALLED TO, READ BACK BY AND VERIFIED WITH: PHARMD ADESOUA UTOMWEN ON 03/22/24 @ 1346 BY DRT    Klebsiella aerogenes NOT DETECTED NOT DETECTED Final   Klebsiella oxytoca NOT DETECTED NOT DETECTED Final   Klebsiella pneumoniae NOT DETECTED NOT DETECTED Final   Proteus species NOT DETECTED NOT DETECTED Final   Salmonella species NOT DETECTED NOT DETECTED Final   Serratia marcescens NOT DETECTED NOT DETECTED Final   Haemophilus influenzae NOT DETECTED NOT DETECTED Final  Neisseria meningitidis NOT DETECTED NOT DETECTED Final   Pseudomonas aeruginosa DETECTED (A) NOT DETECTED Final    Comment: CRITICAL RESULT CALLED TO, READ BACK BY AND VERIFIED WITH: PHARMD ADESOUA UTOMWEN ON 03/22/24 @ 1345 BY DRT    Stenotrophomonas maltophilia NOT DETECTED NOT DETECTED Final   Candida albicans NOT DETECTED NOT DETECTED Final   Candida  auris NOT DETECTED NOT DETECTED Final   Candida glabrata NOT DETECTED NOT DETECTED Final   Candida krusei NOT DETECTED NOT DETECTED Final   Candida parapsilosis NOT DETECTED NOT DETECTED Final   Candida tropicalis NOT DETECTED NOT DETECTED Final   Cryptococcus neoformans/gattii NOT DETECTED NOT DETECTED Final   CTX-M ESBL NOT DETECTED NOT DETECTED Final   Carbapenem resistance IMP NOT DETECTED NOT DETECTED Final   Carbapenem resistance KPC NOT DETECTED NOT DETECTED Final   Carbapenem resistance NDM NOT DETECTED NOT DETECTED Final   Carbapenem resist OXA 48 LIKE NOT DETECTED NOT DETECTED Final   Carbapenem resistance VIM NOT DETECTED NOT DETECTED Final    Comment: Performed at [facility], [address], [place], [place] [address]  MRSA Next Gen by PCR, Nasal     Status: None   Collection Time: 03/21/24  5:49 PM   Specimen: Nasal Mucosa; Nasal Swab  Result Value Ref Range Status   MRSA by PCR Next Gen NOT DETECTED NOT DETECTED Final    Comment: (NOTE) The GeneXpert MRSA Assay (FDA approved for NASAL specimens only), is one component of a comprehensive MRSA colonization surveillance program. It is not intended to diagnose MRSA infection nor to guide or monitor treatment for MRSA infections. Test performance is not FDA approved in patients less than 65 years old. Performed at [facility], [address] [place], [place] [address]      Labs: BNP (last 3 results) No results for input(s): BNP in the last 8760 hours. Basic Metabolic Panel: Recent Labs  Lab 03/21/24 1310 03/22/24 0313 03/23/24 0454  NA 139 138 137  K 3.5 4.1 4.5  CL 104 104 103  CO2 20* 25 27  GLUCOSE 179* 193* 157*  BUN 19 23 21   CREATININE 1.16* 0.94 0.85  CALCIUM 9.2 8.9 9.3  MG  --  1.8 2.3  PHOS  --  3.4 2.7   Liver Function Tests: Recent Labs  Lab 03/21/24 1310  AST 26  ALT 12  ALKPHOS 83  BILITOT 1.3*  PROT 6.9  ALBUMIN 3.4*   No results for input(s): LIPASE,  AMYLASE in the last 168 hours. No results for input(s): AMMONIA in the last 168 hours. CBC: Recent Labs  Lab 03/21/24 1310 03/21/24 1803 03/22/24 0313 03/23/24 0454  WBC 14.4* 20.1* 18.8* 12.8*  NEUTROABS 13.4* 18.8*  --   --   HGB 18.6* 17.9* 17.0* 15.9*  HCT 59.4* 57.0* 53.5* 52.1*  MCV 99.8 99.7 100.9* 101.4*  PLT 84* 86* 81* 61*   Cardiac Enzymes: No results for input(s): CKTOTAL, CKMB, CKMBINDEX, TROPONINI in the last 168 hours. BNP: Invalid input(s): POCBNP CBG: Recent Labs  Lab 03/23/24 2015 03/24/24 0000 03/24/24 0414 03/24/24 0734 03/24/24 1133  GLUCAP 239* 182* 128* 162* 288*   D-Dimer No results for input(s): DDIMER in the last 72 hours. Hgb A1c Recent Labs    03/21/24 1803  HGBA1C 7.0*   Lipid Profile No results for input(s): CHOL, HDL, LDLCALC, TRIG, CHOLHDL, LDLDIRECT in the last 72 hours. Thyroid function studies No results for input(s): TSH, T4TOTAL, T3FREE, THYROIDAB in the last 72  hours.  Invalid input(s): FREET3 Anemia work up No results for input(s): VITAMINB12, FOLATE, FERRITIN, TIBC, IRON, RETICCTPCT in the last 72 hours. Urinalysis    Component Value Date/Time   COLORURINE STRAW (A) 03/22/2024 1234   APPEARANCEUR CLEAR 03/22/2024 1234   LABSPEC 1.006 03/22/2024 1234   PHURINE 5.0 03/22/2024 1234   GLUCOSEU NEGATIVE 03/22/2024 1234   HGBUR SMALL (A) 03/22/2024 1234   BILIRUBINUR NEGATIVE 03/22/2024 1234   KETONESUR NEGATIVE 03/22/2024 1234   PROTEINUR NEGATIVE 03/22/2024 1234   UROBILINOGEN 1.0 04/01/2015 0948   NITRITE NEGATIVE 03/22/2024 1234   LEUKOCYTESUR NEGATIVE 03/22/2024 1234   Sepsis Labs Recent Labs  Lab 03/21/24 1310 03/21/24 1803 03/22/24 0313 03/23/24 0454  WBC 14.4* 20.1* 18.8* 12.8*   Microbiology Recent Results (from the past 240 hours)  Resp panel by RT-PCR (RSV, Flu A&B, Covid) Anterior Nasal Swab     Status: None   Collection Time: 03/21/24  1:10 PM    Specimen: Anterior Nasal Swab  Result Value Ref Range Status   SARS Coronavirus 2 by RT PCR NEGATIVE NEGATIVE Final    Comment: (NOTE) SARS-CoV-2 target nucleic acids are NOT DETECTED.  The SARS-CoV-2 RNA is generally detectable in upper respiratory specimens during the acute phase of infection. The lowest concentration of SARS-CoV-2 viral copies this assay can detect is 138 copies/mL. A negative result does not preclude SARS-Cov-2 infection and should not be used as the sole basis for treatment or other patient management decisions. A negative result may occur with  improper specimen collection/handling, submission of specimen other than nasopharyngeal swab, presence of viral mutation(s) within the areas targeted by this assay, and inadequate number of viral copies(<138 copies/mL). A negative result must be combined with clinical observations, patient history, and epidemiological information. The expected result is Negative.  Fact Sheet for Patients:  bloggercourse.com  Fact Sheet for Healthcare Providers:  seriousbroker.it  This test is no t yet approved or cleared by the [place]  FDA and  has been authorized for detection and/or diagnosis of SARS-CoV-2 by FDA under an Emergency Use Authorization (EUA). This EUA will remain  in effect (meaning this test can be used) for the duration of the COVID-19 declaration under Section 564(b)(1) of the Act, 21 U.S.C.section 360bbb-3(b)(1), unless the authorization is terminated  or revoked sooner.       Influenza A by PCR NEGATIVE NEGATIVE Final   Influenza B by PCR NEGATIVE NEGATIVE Final    Comment: (NOTE) The Xpert Xpress SARS-CoV-2/FLU/RSV plus assay is intended as an aid in the diagnosis of influenza from Nasopharyngeal swab specimens and should not be used as a sole basis for treatment. Nasal washings and aspirates are unacceptable for Xpert Xpress  SARS-CoV-2/FLU/RSV testing.  Fact Sheet for Patients: bloggercourse.com  Fact Sheet for Healthcare Providers: seriousbroker.it  This test is not yet approved or cleared by the [place]  FDA and has been authorized for detection and/or diagnosis of SARS-CoV-2 by FDA under an Emergency Use Authorization (EUA). This EUA will remain in effect (meaning this test can be used) for the duration of the COVID-19 declaration under Section 564(b)(1) of the Act, 21 U.S.C. section 360bbb-3(b)(1), unless the authorization is terminated or revoked.     Resp Syncytial Virus by PCR NEGATIVE NEGATIVE Final    Comment: (NOTE) Fact Sheet for Patients: bloggercourse.com  Fact Sheet for Healthcare Providers: seriousbroker.it  This test is not yet approved or cleared by the [place]  FDA and has been authorized for detection and/or diagnosis of SARS-CoV-2 by FDA  under an Emergency Use Authorization (EUA). This EUA will remain in effect (meaning this test can be used) for the duration of the COVID-19 declaration under Section 564(b)(1) of the Act, 21 U.S.C. section 360bbb-3(b)(1), unless the authorization is terminated or revoked.  Performed at [facility], [address] [place], [place] [address]   Blood Culture (routine x 2)     Status: None (Preliminary result)   Collection Time: 03/21/24  1:10 PM   Specimen: BLOOD  Result Value Ref Range Status   Specimen Description   Final    BLOOD RIGHT ANTECUBITAL Performed at [facility], [address], [place], [place] [address]    Special Requests   Final    BOTTLES DRAWN AEROBIC AND ANAEROBIC Blood Culture results may not be optimal due to an inadequate volume of blood received in culture bottles Performed at [facility], [address], [place], [place] [address]    Culture  Setup  Time   Final    GRAM NEGATIVE RODS AEROBIC BOTTLE ONLY CRITICAL VALUE NOTED.  VALUE IS CONSISTENT WITH PREVIOUSLY REPORTED AND CALLED VALUE.    Culture   Final    GRAM NEGATIVE RODS CULTURE REINCUBATED FOR BETTER GROWTH Performed at [facility], [address] [place], [place] [address]    Report Status PENDING  Incomplete  Blood Culture (routine x 2)     Status: None (Preliminary result)   Collection Time: 03/21/24  1:10 PM   Specimen: BLOOD  Result Value Ref Range Status   Specimen Description   Final    BLOOD LEFT ANTECUBITAL Performed at [facility], [address], [place], [place] [address]    Special Requests   Final    BOTTLES DRAWN AEROBIC AND ANAEROBIC Blood Culture results may not be optimal due to an inadequate volume of blood received in culture bottles Performed at [facility], [address], [place], [place] [address]    Culture  Setup Time   Final    GRAM NEGATIVE RODS IN BOTH AEROBIC AND ANAEROBIC BOTTLES CRITICAL RESULT CALLED TO, READ BACK BY AND VERIFIED WITH: PHARMD ADESOUA UTOMWEN ON 03/22/24 @ 1317 BY DRT    Culture   Final    Two isolates with different morphologies were identified as the same organism.The most resistant organism was reported. ESCHERICHIA COLI PSEUDOMONAS AERUGINOSA SUSCEPTIBILITIES TO FOLLOW Performed at [facility], [address], [place], [place] [address]    Report Status PENDING  Incomplete   Organism ID, Bacteria ESCHERICHIA COLI  Final      Susceptibility   Escherichia coli - MIC*    AMPICILLIN 4 SENSITIVE Sensitive     CEFAZOLIN (NON-URINE) <=1 SENSITIVE Sensitive     CEFEPIME <=0.12 SENSITIVE Sensitive     ERTAPENEM <=0.12 SENSITIVE Sensitive     CEFTRIAXONE <=0.25 SENSITIVE Sensitive     CIPROFLOXACIN <=0.06 SENSITIVE Sensitive     GENTAMICIN <=1 SENSITIVE Sensitive     MEROPENEM <=0.25 SENSITIVE Sensitive     TRIMETH/SULFA <=20 SENSITIVE Sensitive      AMPICILLIN/SULBACTAM <=2 SENSITIVE Sensitive     PIP/TAZO Value in next row Sensitive      <=4 SENSITIVEThis is a modified FDA-approved test that has been validated and its performance characteristics determined by the reporting laboratory.  This laboratory is certified under the Clinical Laboratory Improvement Amendments CLIA as qualified to perform high complexity clinical laboratory testing.    * ESCHERICHIA COLI  Respiratory (~  20 pathogens) panel by PCR     Status: None   Collection Time: 03/21/24  1:10 PM   Specimen: Nasopharyngeal Swab; Respiratory  Result Value Ref Range Status   Adenovirus NOT DETECTED NOT DETECTED Final   Coronavirus 229E NOT DETECTED NOT DETECTED Final    Comment: (NOTE) The Coronavirus on the Respiratory Panel, DOES NOT test for the novel  Coronavirus (2019 nCoV)    Coronavirus HKU1 NOT DETECTED NOT DETECTED Final   Coronavirus NL63 NOT DETECTED NOT DETECTED Final   Coronavirus OC43 NOT DETECTED NOT DETECTED Final   Metapneumovirus NOT DETECTED NOT DETECTED Final   Rhinovirus / Enterovirus NOT DETECTED NOT DETECTED Final   Influenza A NOT DETECTED NOT DETECTED Final   Influenza B NOT DETECTED NOT DETECTED Final   Parainfluenza Virus 1 NOT DETECTED NOT DETECTED Final   Parainfluenza Virus 2 NOT DETECTED NOT DETECTED Final   Parainfluenza Virus 3 NOT DETECTED NOT DETECTED Final   Parainfluenza Virus 4 NOT DETECTED NOT DETECTED Final   Respiratory Syncytial Virus NOT DETECTED NOT DETECTED Final   Bordetella pertussis NOT DETECTED NOT DETECTED Final   Bordetella Parapertussis NOT DETECTED NOT DETECTED Final   Chlamydophila pneumoniae NOT DETECTED NOT DETECTED Final   Mycoplasma pneumoniae NOT DETECTED NOT DETECTED Final    Comment: Performed at [facility], [address], [place], [place] [address]  Blood Culture ID Panel (Reflexed)     Status: Abnormal   Collection Time: 03/21/24  1:10 PM  Result Value Ref Range Status   Enterococcus faecalis NOT  DETECTED NOT DETECTED Final   Enterococcus Faecium NOT DETECTED NOT DETECTED Final   Listeria monocytogenes NOT DETECTED NOT DETECTED Final   Staphylococcus species NOT DETECTED NOT DETECTED Final   Staphylococcus aureus (BCID) NOT DETECTED NOT DETECTED Final   Staphylococcus epidermidis NOT DETECTED NOT DETECTED Final   Staphylococcus lugdunensis NOT DETECTED NOT DETECTED Final   Streptococcus species NOT DETECTED NOT DETECTED Final   Streptococcus agalactiae NOT DETECTED NOT DETECTED Final   Streptococcus pneumoniae NOT DETECTED NOT DETECTED Final   Streptococcus pyogenes NOT DETECTED NOT DETECTED Final   A.calcoaceticus-baumannii NOT DETECTED NOT DETECTED Final   Bacteroides fragilis NOT DETECTED NOT DETECTED Final   Enterobacterales DETECTED (A) NOT DETECTED Final    Comment: Enterobacterales represent a large order of gram negative bacteria, not a single organism. CRITICAL RESULT CALLED TO, READ BACK BY AND VERIFIED WITH: PHARMD ADESOUA UTOMWEN ON 03/22/24 @ 1346 BY DRT    Enterobacter cloacae complex NOT DETECTED NOT DETECTED Final   Escherichia coli DETECTED (A) NOT DETECTED Final    Comment: CRITICAL RESULT CALLED TO, READ BACK BY AND VERIFIED WITH: PHARMD ADESOUA UTOMWEN ON 03/22/24 @ 1346 BY DRT    Klebsiella aerogenes NOT DETECTED NOT DETECTED Final   Klebsiella oxytoca NOT DETECTED NOT DETECTED Final   Klebsiella pneumoniae NOT DETECTED NOT DETECTED Final   Proteus species NOT DETECTED NOT DETECTED Final   Salmonella species NOT DETECTED NOT DETECTED Final   Serratia marcescens NOT DETECTED NOT DETECTED Final   Haemophilus influenzae NOT DETECTED NOT DETECTED Final   Neisseria meningitidis NOT DETECTED NOT DETECTED Final   Pseudomonas aeruginosa DETECTED (A) NOT DETECTED Final    Comment: CRITICAL RESULT CALLED TO, READ BACK BY AND VERIFIED WITH: PHARMD ADESOUA UTOMWEN ON 03/22/24 @ 1345 BY DRT    Stenotrophomonas maltophilia NOT DETECTED NOT DETECTED Final   Candida  albicans NOT DETECTED NOT DETECTED Final   Candida auris NOT DETECTED NOT DETECTED  Final   Candida glabrata NOT DETECTED NOT DETECTED Final   Candida krusei NOT DETECTED NOT DETECTED Final   Candida parapsilosis NOT DETECTED NOT DETECTED Final   Candida tropicalis NOT DETECTED NOT DETECTED Final   Cryptococcus neoformans/gattii NOT DETECTED NOT DETECTED Final   CTX-M ESBL NOT DETECTED NOT DETECTED Final   Carbapenem resistance IMP NOT DETECTED NOT DETECTED Final   Carbapenem resistance KPC NOT DETECTED NOT DETECTED Final   Carbapenem resistance NDM NOT DETECTED NOT DETECTED Final   Carbapenem resist OXA 48 LIKE NOT DETECTED NOT DETECTED Final   Carbapenem resistance VIM NOT DETECTED NOT DETECTED Final    Comment: Performed at [facility], [address], [place], [place] [address]  MRSA Next Gen by PCR, Nasal     Status: None   Collection Time: 03/21/24  5:49 PM   Specimen: Nasal Mucosa; Nasal Swab  Result Value Ref Range Status   MRSA by PCR Next Gen NOT DETECTED NOT DETECTED Final    Comment: (NOTE) The GeneXpert MRSA Assay (FDA approved for NASAL specimens only), is one component of a comprehensive MRSA colonization surveillance program. It is not intended to diagnose MRSA infection nor to guide or monitor treatment for MRSA infections. Test performance is not FDA approved in patients less than 17 years old. Performed at [facility], [address], [place], [place] [address]      Time coordinating discharge: Over 30 minutes  SIGNED:   Elsie JAYSON Montclair, DO [facility] 03/24/2024, 12:49 PM Pager   If 7PM-7AM, please contact night-coverage [email or web address]

## 2024-03-24 NOTE — Progress Notes (Signed)
   03/24/24 1201  TOC Brief Assessment  Insurance and Status Reviewed  Patient has primary care physician Yes  Home environment has been reviewed Apartment  Prior level of function: independent  Prior/Current Home Services No current home services  Social Drivers of Health Review SDOH reviewed no interventions necessary  Readmission risk has been reviewed Yes  Transition of care needs no transition of care needs at this time   [facility] setup w/ [facility], RW to be delivered to room via [employer]

## 2024-03-24 NOTE — Plan of Care (Signed)
   Problem: Education: Goal: Ability to describe self-care measures that may prevent or decrease complications (Diabetes Survival Skills Education) will improve Outcome: Progressing

## 2024-03-25 ENCOUNTER — Encounter (HOSPITAL_COMMUNITY): Payer: Self-pay

## 2024-03-25 ENCOUNTER — Inpatient Hospital Stay (HOSPITAL_COMMUNITY)
Admission: EM | Admit: 2024-03-25 | Discharge: 2024-03-29 | DRG: 871 | Disposition: A | Discharge status: Home Health | Attending: Internal Medicine | Admitting: Internal Medicine

## 2024-03-25 ENCOUNTER — Emergency Department (HOSPITAL_COMMUNITY)

## 2024-03-25 ENCOUNTER — Other Ambulatory Visit: Payer: Self-pay

## 2024-03-25 ENCOUNTER — Other Ambulatory Visit: Payer: Self-pay | Admitting: Internal Medicine

## 2024-03-25 DIAGNOSIS — Z7951 Long term (current) use of inhaled steroids: Secondary | ICD-10-CM

## 2024-03-25 DIAGNOSIS — K5732 Diverticulitis of large intestine without perforation or abscess without bleeding: Secondary | ICD-10-CM | POA: Diagnosis present

## 2024-03-25 DIAGNOSIS — D6959 Other secondary thrombocytopenia: Secondary | ICD-10-CM | POA: Diagnosis present

## 2024-03-25 DIAGNOSIS — J441 Chronic obstructive pulmonary disease with (acute) exacerbation: Secondary | ICD-10-CM | POA: Diagnosis present

## 2024-03-25 DIAGNOSIS — J9601 Acute respiratory failure with hypoxia: Secondary | ICD-10-CM | POA: Diagnosis present

## 2024-03-25 DIAGNOSIS — J9811 Atelectasis: Secondary | ICD-10-CM | POA: Diagnosis present

## 2024-03-25 DIAGNOSIS — I5032 Chronic diastolic (congestive) heart failure: Secondary | ICD-10-CM

## 2024-03-25 DIAGNOSIS — E66813 Obesity, class 3: Secondary | ICD-10-CM | POA: Diagnosis present

## 2024-03-25 DIAGNOSIS — A419 Sepsis, unspecified organism: Principal | ICD-10-CM | POA: Diagnosis present

## 2024-03-25 DIAGNOSIS — Z7985 Long-term (current) use of injectable non-insulin antidiabetic drugs: Secondary | ICD-10-CM

## 2024-03-25 DIAGNOSIS — E1165 Type 2 diabetes mellitus with hyperglycemia: Secondary | ICD-10-CM | POA: Diagnosis present

## 2024-03-25 DIAGNOSIS — I10 Essential (primary) hypertension: Secondary | ICD-10-CM

## 2024-03-25 DIAGNOSIS — R6520 Severe sepsis without septic shock: Secondary | ICD-10-CM | POA: Diagnosis present

## 2024-03-25 DIAGNOSIS — R531 Weakness: Secondary | ICD-10-CM

## 2024-03-25 DIAGNOSIS — K5792 Diverticulitis of intestine, part unspecified, without perforation or abscess without bleeding: Secondary | ICD-10-CM | POA: Diagnosis not present

## 2024-03-25 DIAGNOSIS — F17210 Nicotine dependence, cigarettes, uncomplicated: Secondary | ICD-10-CM | POA: Diagnosis present

## 2024-03-25 DIAGNOSIS — E876 Hypokalemia: Secondary | ICD-10-CM | POA: Diagnosis present

## 2024-03-25 DIAGNOSIS — I110 Hypertensive heart disease with heart failure: Secondary | ICD-10-CM | POA: Diagnosis present

## 2024-03-25 DIAGNOSIS — J45901 Unspecified asthma with (acute) exacerbation: Secondary | ICD-10-CM | POA: Diagnosis present

## 2024-03-25 DIAGNOSIS — Z6841 Body Mass Index (BMI) 40.0 and over, adult: Secondary | ICD-10-CM

## 2024-03-25 DIAGNOSIS — E119 Type 2 diabetes mellitus without complications: Secondary | ICD-10-CM

## 2024-03-25 DIAGNOSIS — G894 Chronic pain syndrome: Secondary | ICD-10-CM | POA: Diagnosis present

## 2024-03-25 DIAGNOSIS — E872 Acidosis, unspecified: Secondary | ICD-10-CM | POA: Diagnosis present

## 2024-03-25 DIAGNOSIS — E785 Hyperlipidemia, unspecified: Secondary | ICD-10-CM | POA: Diagnosis present

## 2024-03-25 LAB — CULTURE, BLOOD (ROUTINE X 2)

## 2024-03-25 LAB — COMPREHENSIVE METABOLIC PANEL WITH GFR
ALT: 17 U/L (ref 0–44)
AST: 17 U/L (ref 15–41)
Albumin: 2.9 g/dL — ABNORMAL LOW (ref 3.5–5.0)
Alkaline Phosphatase: 104 U/L (ref 38–126)
Anion gap: 11 (ref 5–15)
BUN: 14 mg/dL (ref 8–23)
CO2: 26 mmol/L (ref 22–32)
Calcium: 9.2 mg/dL (ref 8.9–10.3)
Chloride: 100 mmol/L (ref 98–111)
Creatinine, Ser: 0.88 mg/dL (ref 0.44–1.00)
GFR, Estimated: >60 mL/min (ref >60)
Glucose, Bld: 202 mg/dL — ABNORMAL HIGH (ref 70–99)
Potassium: 3.3 mmol/L — ABNORMAL LOW (ref 3.5–5.1)
Sodium: 138 mmol/L (ref 135–145)
Total Bilirubin: 2.5 mg/dL — ABNORMAL HIGH (ref 0.0–1.2)
Total Protein: 6.4 g/dL — ABNORMAL LOW (ref 6.5–8.1)

## 2024-03-25 LAB — CBC WITH DIFFERENTIAL/PLATELET
Abs Immature Granulocytes: 0.33 K/uL — ABNORMAL HIGH (ref 0.00–0.07)
Basophils Absolute: 0 K/uL (ref 0.0–0.1)
Basophils Relative: 0 %
Eosinophils Absolute: 0 K/uL (ref 0.0–0.5)
Eosinophils Relative: 0 %
HCT: 52.3 % — ABNORMAL HIGH (ref 36.0–46.0)
Hemoglobin: 16.8 g/dL — ABNORMAL HIGH (ref 12.0–15.0)
Immature Granulocytes: 4 %
Lymphocytes Relative: 3 %
Lymphs Abs: 0.3 K/uL — ABNORMAL LOW (ref 0.7–4.0)
MCH: 30.9 pg (ref 26.0–34.0)
MCHC: 32.1 g/dL (ref 30.0–36.0)
MCV: 96.1 fL (ref 80.0–100.0)
Monocytes Absolute: 0.4 K/uL (ref 0.1–1.0)
Monocytes Relative: 4 %
Neutro Abs: 8.1 K/uL — ABNORMAL HIGH (ref 1.7–7.7)
Neutrophils Relative %: 89 %
Platelets: 48 K/uL — ABNORMAL LOW (ref 150–400)
RBC: 5.44 MIL/uL — ABNORMAL HIGH (ref 3.87–5.11)
RDW: 13.2 % (ref 11.5–15.5)
WBC: 9.2 K/uL (ref 4.0–10.5)
nRBC: 0 % (ref 0.0–0.2)

## 2024-03-25 LAB — BLOOD GAS, VENOUS
Acid-Base Excess: 6.3 mmol/L — ABNORMAL HIGH (ref 0.0–2.0)
Bicarbonate: 29.5 mmol/L — ABNORMAL HIGH (ref 20.0–28.0)
O2 Saturation: 93.2 %
Patient temperature: 37
pCO2, Ven: 37 mmHg — ABNORMAL LOW (ref 44–60)
pH, Ven: 7.51 — ABNORMAL HIGH (ref 7.25–7.43)
pO2, Ven: 62 mmHg — ABNORMAL HIGH (ref 32–45)

## 2024-03-25 LAB — PROTIME-INR
INR: 1.2 (ref 0.8–1.2)
Prothrombin Time: 16.1 s — ABNORMAL HIGH (ref 11.4–15.2)

## 2024-03-25 LAB — TROPONIN T, HIGH SENSITIVITY
Troponin T High Sensitivity: <15 ng/L (ref 0–19)
Troponin T High Sensitivity: <15 ng/L (ref 0–19)

## 2024-03-25 LAB — HCG, SERUM, QUALITATIVE: Preg, Serum: NEGATIVE

## 2024-03-25 LAB — PRO BRAIN NATRIURETIC PEPTIDE: Pro Brain Natriuretic Peptide: 646 pg/mL — ABNORMAL HIGH (ref <300.0)

## 2024-03-25 LAB — LIPASE, BLOOD: Lipase: 20 U/L (ref 11–51)

## 2024-03-25 LAB — I-STAT CG4 LACTIC ACID, ED
Lactic Acid, Venous: 2 mmol/L (ref 0.5–1.9)
Lactic Acid, Venous: 1.4 mmol/L (ref 0.5–1.9)

## 2024-03-25 MED ORDER — SENNOSIDES-DOCUSATE SODIUM 8.6-50 MG PO TABS: 1.0000 | ORAL_TABLET | Freq: Every evening | ORAL | Status: DC | PRN

## 2024-03-25 MED ORDER — LACTATED RINGERS IV BOLUS
1000.0000 mL | Freq: Once | INTRAVENOUS | Status: AC
  Administered 2024-03-25: 1000 mL via INTRAVENOUS

## 2024-03-25 MED ORDER — VANCOMYCIN HCL 2000 MG/400ML IV SOLN
2000.0000 mg | Freq: Once | INTRAVENOUS | Status: AC
  Administered 2024-03-25: 2000 mg via INTRAVENOUS
  Filled 2024-03-25: qty 400

## 2024-03-25 MED ORDER — VANCOMYCIN HCL IN DEXTROSE 1-5 GM/200ML-% IV SOLN: 1000.0000 mg | Freq: Once | INTRAVENOUS | Status: DC

## 2024-03-25 MED ORDER — IOHEXOL 350 MG/ML SOLN
100.0000 mL | Freq: Once | INTRAVENOUS | Status: AC | PRN
  Administered 2024-03-25: 100 mL via INTRAVENOUS

## 2024-03-25 MED ORDER — ONDANSETRON HCL 4 MG PO TABS: 4.0000 mg | ORAL_TABLET | Freq: Four times a day (QID) | ORAL | Status: DC | PRN

## 2024-03-25 MED ORDER — ONDANSETRON HCL 4 MG/2ML IJ SOLN
4.0000 mg | Freq: Four times a day (QID) | INTRAMUSCULAR | Status: DC | PRN
  Administered 2024-03-29: 4 mg via INTRAVENOUS
  Filled 2024-03-25: qty 2

## 2024-03-25 MED ORDER — SODIUM CHLORIDE 0.9 % IV SOLN: INTRAVENOUS | Status: AC

## 2024-03-25 MED ORDER — ACETAMINOPHEN 650 MG RE SUPP: 650.0000 mg | Freq: Four times a day (QID) | RECTAL | Status: DC | PRN

## 2024-03-25 MED ORDER — METRONIDAZOLE 500 MG/100ML IV SOLN
500.0000 mg | Freq: Once | INTRAVENOUS | Status: AC
  Administered 2024-03-26: 500 mg via INTRAVENOUS
  Filled 2024-03-25: qty 100

## 2024-03-25 MED ORDER — CIPROFLOXACIN HCL 500 MG PO TABS: 500.0000 mg | ORAL_TABLET | Freq: Two times a day (BID) | ORAL | 0 refills | Status: DC

## 2024-03-25 MED ORDER — CIPROFLOXACIN HCL 250 MG PO TABS: 750.0000 mg | ORAL_TABLET | Freq: Two times a day (BID) | ORAL | 0 refills | Status: DC

## 2024-03-25 MED ORDER — BISACODYL 5 MG PO TBEC: 5.0000 mg | DELAYED_RELEASE_TABLET | Freq: Every day | ORAL | Status: DC | PRN

## 2024-03-25 MED ORDER — CHLORHEXIDINE GLUCONATE CLOTH 2 % EX PADS
6.0000 | MEDICATED_PAD | Freq: Every day | CUTANEOUS | Status: DC
  Administered 2024-03-26 – 2024-03-27 (×2): 6 via TOPICAL

## 2024-03-25 MED ORDER — SODIUM CHLORIDE 0.9 % IV BOLUS
500.0000 mL | Freq: Once | INTRAVENOUS | Status: AC
Start: 2024-03-25 — End: 2024-03-26
  Administered 2024-03-26: 500 mL via INTRAVENOUS

## 2024-03-25 MED ORDER — SODIUM CHLORIDE 0.9 % IV SOLN
2.0000 g | Freq: Once | INTRAVENOUS | Status: AC
  Administered 2024-03-25: 2 g via INTRAVENOUS
  Filled 2024-03-25: qty 12.5

## 2024-03-25 MED ORDER — ACETAMINOPHEN 325 MG PO TABS
650.0000 mg | ORAL_TABLET | Freq: Four times a day (QID) | ORAL | Status: DC | PRN
  Administered 2024-03-28: 650 mg via ORAL
  Filled 2024-03-25: qty 2

## 2024-03-25 MED ORDER — ONDANSETRON HCL 4 MG/2ML IJ SOLN
4.0000 mg | Freq: Once | INTRAMUSCULAR | Status: AC
  Administered 2024-03-25: 4 mg via INTRAVENOUS
  Filled 2024-03-25: qty 2

## 2024-03-25 MED ORDER — POTASSIUM CHLORIDE CRYS ER 20 MEQ PO TBCR
40.0000 meq | EXTENDED_RELEASE_TABLET | Freq: Once | ORAL | Status: AC
  Administered 2024-03-26: 40 meq via ORAL
  Filled 2024-03-25: qty 2

## 2024-03-25 MED ORDER — HYDROMORPHONE HCL 1 MG/ML IJ SOLN
0.5000 mg | Freq: Once | INTRAMUSCULAR | Status: AC
  Administered 2024-03-25: 0.5 mg via INTRAVENOUS
  Filled 2024-03-25: qty 1

## 2024-03-25 NOTE — Progress Notes (Unsigned)
 Cultures reporting pseudomonas per pharmacy/ID - will transition to Cipro and await for final results.

## 2024-03-25 NOTE — H&P (Incomplete)
 History and Physical  Lisa Ibarra FMW:[id number] DOB: March 28, 1961 DOA: 03/25/2024  PCP: Cesario Mutton, MD   Chief Complaint: Generalized weakness  HPI: Lisa Ibarra is a 63 y.o. female with medical history significant for type 2 diabetes, morbid obesity, chronic pain syndrome, HTN, asthma/COPD, tobacco use disorder, chronic diastolic HF, recent thrombocytopenia and recent hospitalization who returns to the ED for evaluation of generalized weakness, persistent abdominal pain, nausea and vomiting. Patient had a brief hospitalization from 10/30 - 11/2 for acute hypoxic respiratory failure presumed due to COPD/asthma exacerbation. Patient reports she had abdominal pain during her recent hospitalization and it worsened today.  She reports subjective fever, nausea, vomiting, dizziness and generalized malaise. She also endorsed intermittent productive cough and mild shortness of breath but no chest pain, diarrhea, chills, dysuria, or headache.   ED Course: Initial vitals show temp 99, RR 17-20, HR 100-1 30s, SBP 100-1 20s, SpO2 92% on 4 L. Initial labs significant for K+ 3.3, glucose 202, WBC 9.2, Hgb 16.8, platelet 48, lactic acid 2.0-1.4, normal troponin.  CTA chest PE study negative for PE but shows atelectasis in the mid and lower lungs.  CT A/P shows sigmoid diverticulosis with active diverticulitis in the mid sigmoid colon.  Pt received IV LR 1 L bolus, IV Dilaudid, IV Zofran , IV vancomycin, IV cefepime and IV Flagyl. TRH was consulted for admission.   Review of Systems: Please see HPI for pertinent positives and negatives. A complete 10 system review of systems are otherwise negative.  Past Medical History:  Diagnosis Date   Asthma    Cancer (HCC)    Cannabis abuse    Chronic pain    Diabetes mellitus type 2, controlled (HCC)    Hypertension    Past Surgical History:  Procedure Laterality Date   CESAREAN SECTION     JOINT REPLACEMENT     Left eye     TUBAL LIGATION     TUMOR REMOVAL from  back     Social History:  reports that she has been smoking cigarettes. She has never used smokeless tobacco. She reports current drug use. Drug: Marijuana. She reports that she does not drink alcohol.  Allergies  Allergen Reactions   Bee Venom Anaphylaxis and Hives   Empagliflozin Itching, Rash and Dermatitis   Ibuprofen Nausea And Vomiting   Statins Nausea Only and Other (See Comments)    Muscle spasms and cramps, too    Family History  Problem Relation Age of Onset   Stroke Mother    Lung cancer Father    Diabetes Mellitus II Sister      Prior to Admission medications   Medication Sig Start Date End Date Taking? Authorizing Provider  albuterol  (VENTOLIN  HFA) 108 (90 Base) MCG/ACT inhaler Inhale 2 puffs into the lungs every 6 (six) hours as needed for wheezing or shortness of breath. 08/22/21   Jerri Keys, MD  arformoterol (BROVANA) 15 MCG/2ML NEBU Take 2 mLs (15 mcg total) by nebulization 2 (two) times daily. 03/24/24   Lue Elsie BROCKS, MD  Aspirin-Acetaminophen -Caffeine  (GOODYS EXTRA STRENGTH) 500-325-65 MG PACK Take 1 packet by mouth See admin instructions. Dissolve 1 packet in the mouth up to six times a day as needed for pain    [provider]  atorvastatin (LIPITOR) 20 MG tablet Take 20 mg by mouth daily.    [provider]  blood glucose meter kit and supplies KIT Dispense based on patient and insurance preference. Use up to four times daily as directed. 08/22/21  Jerri Keys, MD  budesonide (PULMICORT) 0.5 MG/2ML nebulizer solution Take 2 mLs (0.5 mg total) by nebulization 2 (two) times daily. 03/24/24   Lue Elsie BROCKS, MD  ciprofloxacin (CIPRO) 250 MG tablet Take 3 tablets (750 mg total) by mouth 2 (two) times daily for 10 days. 03/25/24 04/04/24  Lue Elsie BROCKS, MD  furosemide  (LASIX ) 20 MG tablet Take 20-40 mg by mouth daily as needed for fluid or edema.    [provider]  Insulin  Pen Needle 29G X MISC For insulin  injection 08/22/21    Jerri Keys, MD  losartan  (COZAAR ) 25 MG tablet Take 1 tablet (25 mg total) by mouth daily. 03/24/24   Lue Elsie BROCKS, MD  metoprolol  succinate (TOPROL -XL) 25 MG 24 hr tablet Take 0.5 tablets (12.5 mg total) by mouth at bedtime. 03/24/24   Lue Elsie BROCKS, MD  OZEMPIC, 0.25 OR 0.5 MG/DOSE, 2 MG/3ML SOPN Inject 0.25 mg into the skin every Sunday.    [provider]  potassium chloride  SA (KLOR-CON  M) 20 MEQ tablet Take 20 mEq by mouth daily.    [provider]  predniSONE  (DELTASONE ) 10 MG tablet Take 4 tablets (40 mg total) by mouth daily for 3 days, THEN 3 tablets (30 mg total) daily for 3 days, THEN 2 tablets (20 mg total) daily for 3 days, THEN 1 tablet (10 mg total) daily for 3 days. 03/24/24 04/05/24  Lue Elsie BROCKS, MD  revefenacin (YUPELRI) 175 MCG/3ML nebulizer solution Take 3 mLs (175 mcg total) by nebulization daily. 03/25/24   Lue Elsie BROCKS, MD    Physical Exam: BP 116/75   Pulse (!) 106   Temp 98.4 F (36.9 C) (Oral)   Resp (!) 26   Ht 5' 9 (1.753 m)   Wt 130.1 kg   LMP 06/12/2011   SpO2 93%   BMI 42.36 kg/m  General: Pleasant, weak appearing obese woman laying in bed. No acute distress. HEENT: [place]/AT. Anicteric sclera CV: Tachycardic.  Regular rhythm. No murmurs, rubs, or gallops. No LE edema Pulmonary: On 6 L HFNC. Lungs CTAB. Normal effort. Mild end expiratory wheezes.  Rhonchorous lung sounds. Abdominal: Abdominal obesity. Mild tenderness to palpation of the lower abdomen. Normal bowel sounds. Extremities: Palpable radial and DP pulses. Normal ROM. Skin: Warm and dry. No obvious rash or lesions. Neuro: A&Ox3. Moves all extremities. Normal sensation to light touch. No focal deficit. Psych: Normal mood and affect          Labs on Admission:  Basic Metabolic Panel: Recent Labs  Lab 03/21/24 1310 03/22/24 0313 03/23/24 0454 03/25/24 1938  NA 139 138 137 138  K 3.5 4.1 4.5 3.3*  CL 104 104 103 100  CO2 20* 25 27 26   GLUCOSE  179* 193* 157* 202*  BUN 19 23 21 14   CREATININE 1.16* 0.94 0.85 0.88  CALCIUM 9.2 8.9 9.3 9.2  MG  --  1.8 2.3  --   PHOS  --  3.4 2.7  --    Liver Function Tests: Recent Labs  Lab 03/21/24 1310 03/25/24 1938  AST 26 17  ALT 12 17  ALKPHOS 83 104  BILITOT 1.3* 2.5*  PROT 6.9 6.4*  ALBUMIN 3.4* 2.9*   Recent Labs  Lab 03/25/24 1938  LIPASE 20   No results for input(s): AMMONIA in the last 168 hours. CBC: Recent Labs  Lab 03/21/24 1310 03/21/24 1803 03/22/24 0313 03/23/24 0454 03/25/24 1938  WBC 14.4* 20.1* 18.8* 12.8* 9.2  NEUTROABS 13.4* 18.8*  --   --  8.1*  HGB 18.6* 17.9* 17.0* 15.9* 16.8*  HCT 59.4* 57.0* 53.5* 52.1* 52.3*  MCV 99.8 99.7 100.9* 101.4* 96.1  PLT 84* 86* 81* 61* 48*   Cardiac Enzymes: No results for input(s): CKTOTAL, CKMB, CKMBINDEX, TROPONINI in the last 168 hours. BNP (last 3 results) No results for input(s): BNP in the last 8760 hours.  ProBNP (last 3 results) Recent Labs    03/21/24 1310 03/25/24 1938  PROBNP 2,893.0* 646.0*    CBG: Recent Labs  Lab 03/23/24 2015 03/24/24 0000 03/24/24 0414 03/24/24 0734 03/24/24 1133  GLUCAP 239* 182* 128* 162* 288*    Radiological Exams on Admission: CT ABDOMEN PELVIS W CONTRAST Result Date: 03/25/2024 EXAM: CT ABDOMEN AND PELVIS WITH CONTRAST 03/25/2024 09:55:59 PM TECHNIQUE: CT of the abdomen and pelvis was performed with the administration of 100 mL of iohexol  (OMNIPAQUE ) 350 MG/ML injection. Multiplanar reformatted images are provided for review. Automated exposure control, iterative reconstruction, and/or weight-based adjustment of the mA/kV was utilized to reduce the radiation dose to as low as reasonably achievable. COMPARISON: 04/01/2015 CLINICAL HISTORY: Abdominal pain, acute, nonlocalized. FINDINGS: LOWER CHEST: Basilar atelectasis. LIVER: The liver is unremarkable. GALLBLADDER AND BILE DUCTS: Gallbladder is unremarkable. No biliary ductal dilatation. SPLEEN: No acute  abnormality. PANCREAS: No acute abnormality. ADRENAL GLANDS: Stable small nodules in the adrenal glands bilaterally, most compatible with adenomas. KIDNEYS, URETERS AND BLADDER: 1 m. m nonobstructing stone in the upper pole of the left kidney. 8 mm nonobstructing stone in the lower pole of the left kidney. No hydronephrosis. The urinary bladder is obscured by beam hardening artifact from the left hip replacement. Small bilateral renal cysts appear benign. Per consensus, no follow-up is needed for simple Bosniak type 1 and 2 renal cysts, unless the patient has a malignancy history or risk factors. No perinephric or periureteral stranding. GI AND BOWEL: Stomach demonstrates no acute abnormality. Sigmoid diverticulosis. Inflammatory stranding around the mid sigmoid colon compatible with active diverticulitis. There is no bowel obstruction. PERITONEUM AND RETROPERITONEUM: No ascites. No free air. VASCULATURE: Aorta is normal in caliber. Aortic atherosclerosis. LYMPH NODES: No lymphadenopathy. REPRODUCTIVE ORGANS: No acute abnormality. BONES AND SOFT TISSUES: Prior left hip replacement. Degenerative changes in the lumbar spine. No acute bony abnormality. No focal soft tissue abnormality. IMPRESSION: 1. Sigmoid diverticulosis with active diverticulitis in the mid sigmoid colon. 2. Nonobstructing left nephrolithiasis. Electronically signed by: Franky Crease MD 03/25/2024 10:24 PM EST RP Workstation: [id number]   CT Angio Chest PE W and/or Wo Contrast Result Date: 03/25/2024 EXAM: CTA of the Chest with contrast for PE 03/25/2024 09:55:59 PM TECHNIQUE: CTA of the chest was performed after the administration of 100 mL of iohexol  (OMNIPAQUE ) 350 MG/ML injection. Multiplanar reformatted images are provided for review. MIP images are provided for review. Automated exposure control, iterative reconstruction, and/or weight based adjustment of the mA/kV was utilized to reduce the radiation dose to as low as reasonably achievable.  COMPARISON: 08/18/2021 CLINICAL HISTORY: Pulmonary embolism (PE) suspected, high prob. FINDINGS: PULMONARY ARTERIES: Pulmonary arteries are adequately opacified for evaluation. No pulmonary embolism. Main pulmonary artery is normal in caliber. MEDIASTINUM: The heart and pericardium demonstrate no acute abnormality. Scattered coronary artery and aortic atherosclerosis. There is no acute abnormality of the thoracic aorta. LYMPH NODES: No mediastinal, hilar or axillary lymphadenopathy. LUNGS AND PLEURA: Areas of atelectasis in the mid and lower lungs. No focal consolidation or pulmonary edema. No pleural effusion or pneumothorax. UPPER ABDOMEN: Limited images of the upper abdomen are unremarkable. SOFT TISSUES AND BONES: No acute  bone or soft tissue abnormality. IMPRESSION: 1. No pulmonary embolism. 2. Areas of atelectasis in the mid and lower lungs. No effusions. Electronically signed by: Franky Crease MD 03/25/2024 10:19 PM EST RP Workstation: [id number]   My independent interpretation of EKG: Sinus tach with probable biatrial enlargement  Assessment/Plan Lisa Ibarra is a 64 y.o. female with medical history significant for type 2 diabetes, morbid obesity, chronic pain syndrome, HTN, asthma/COPD, tobacco use disorder, chronic diastolic HF, recent thrombocytopenia and recent hospitalization who returns to the ED for evaluation of generalized weakness, persistent abdominal pain, nausea and vomiting and admitted for sepsis secondary to acute diverticulitis.  # Sepsis # Acute diverticulitis - Patient returning today hospital with persistent abdominal pain, nausea and vomiting - CT A/P shows active diverticulitis in the mid sigmoid colon - Met sepsis criteria with tachycardia, tachypnea, lactic acidosis and evidence of intra-abdominal infection - Start IV Zosyn - Start IV hydration with IVNS @ 100 cc/hr - Pain control with as needed IV Dilaudid - Follow-up repeat blood culture - Trend CBC, fever curve  #  Acute hypoxic respiratory failure - Patient recently weaned off O2 prior to discharge from her recent hospitalization now hypoxic on admission requiring up to 6 L HFNC - Patient slightly dry on exam and proBNP of 646 normal for age, making CHF exacerbation unlikely - Patient does have some ongoing productive cough with low-grade fever (could also be from intra-abdominal infection), no chest imaging does not show any evidence of pneumonia - Patient has persistent hypoxia likely a combination of mild COPD exacerbation and her lung atelectasis - Continue supplemental O2, wean as able - Incentive spirometer, flutter valve - Check procalcitonin  # COPD/asthma exacerbation - Presented with hypoxia, productive cough and mild wheezing on exam - Start IV Solu-Medrol  40 mg every 12 hours - Continue Brovana, Pulmicort and Yupelri - As needed DuoNebs  # Bacteremia - Patient found to have blood culture from 10/30 growing E. coli and Pseudomonas - Blood cultures repeated on admission, follow-up to assess for clearance - Continue IV Zosyn as above, should cover Pseudomonas and intra-abdominal infection - Trend CBC, fever curve  # Hypokalemia - K+ 3.3 on admission, repleted with KCl 40 mEq x 1 - Continue potassium supplementation and follow-up morning K+ and mag  # Chronic diastolic HF - Patient euvolemic on exam, proBNP significantly improved to 646 (normal for age) - Monitor fluid status closely on IV hydration  # Acute thrombocytopenia - Patient found to have acute drop in platelet during her last hospitalization - Workup overall unremarkable with normal smear, DIC ruled out, liver unremarkable on admission CT - Unclear cause, but could be secondary to acute illness - She has no signs of bruising or bleeding - Continue to monitor with daily CBC  # T2DM - Last A1c stable at 7.0% 1 week ago - Blood sugar elevated to 202 on admission likely from recent steroid use - SSI with meals, CBG  monitoring  # HTN - BP stable with SBP in the 110-140s - Continue losartan  and Toprol  XL  # HLD -Continue atorvastatin  # Tobacco use disorder - Patient reports she has finally quit smoking with her last cigarettes prior to her recent hospitalization - Patient counseled on the importance of abstaining from cigarettes in the setting of her asthma/COPD  # Generalized weakness - In the setting of acute illness - PT/OT eval and treat  # Class III obesity Body mass index is 42.36 kg/m. Filed Weights   03/25/24 1901  Weight: 130.1 kg  - F/u with PCP for weight lost and nutrition counseling  DVT prophylaxis: Lovenox      Code Status: Full Code  Consults called: None  Family Communication: No family at bedside  Severity of Illness: The appropriate patient status for this patient is INPATIENT. Inpatient status is judged to be reasonable and necessary in order to provide the required intensity of service to ensure the patient's safety. The patient's presenting symptoms, physical exam findings, and initial radiographic and laboratory data in the context of their chronic comorbidities is felt to place them at high risk for further clinical deterioration. Furthermore, it is not anticipated that the patient will be medically stable for discharge from the hospital within 2 midnights of admission.   * I certify that at the point of admission it is my clinical judgment that the patient will require inpatient hospital care spanning beyond 2 midnights from the point of admission due to high intensity of service, high risk for further deterioration and high frequency of surveillance required.*  Level of care: Stepdown   I personally spent a total of 80 minutes in the care of the patient today including preparing to see the patient, getting/reviewing separately obtained history, performing a medically appropriate exam/evaluation, counseling and educating, placing orders, documenting clinical  information in the EHR, and independently interpreting results.   Lou Claretta HERO, MD 03/26/2024, 1:31 AM [facility] Pager: [phone] Isaiah 41:10   If 7PM-7AM, please contact night-coverage [email or web address] Password [email or web address]

## 2024-03-25 NOTE — ED Provider Notes (Signed)
 [place] EMERGENCY DEPARTMENT AT [facility] Provider Note  CSN: [id number] Arrival date & time: 03/25/24 1840  Chief Complaint(s) Weakness and Abdominal Pain  HPI Lisa Ibarra is a 63 y.o. female history of diabetes, COPD, recent admission for possible sepsis presenting to the emergency department with nausea and vomiting, generalized weakness, fatigue, subjective fevers and chills.  Was discharged yesterday.  Reports associated cough, shortness of breath, has been having diarrhea but not since leaving the hospital because she has not been eating anything.  Reports lower abdominal pain as well.  Reports feeling just as bad if not worse at the time of leaving the hospital.  Was prescribed an antibiotic and reports she has tried taking it but just vomits it all out.  Reports she feeling very bad today so called paramedics.  Paramedics found the patient to be hypoxic, diaphoretic, tachycardic, slightly confused.  Reports that she had a temperature as well but did not tell nursing what it was.   Past Medical History Past Medical History:  Diagnosis Date   Asthma    Cancer (HCC)    Cannabis abuse    Chronic pain    Diabetes mellitus type 2, controlled (HCC)    Hypertension    Patient Active Problem List   Diagnosis Date Noted   COPD with acute exacerbation (HCC) 03/23/2024   Acute exacerbation of CHF (congestive heart failure) (HCC) 03/21/2024   COVID-19 08/18/2021   Hypokalemia 08/18/2021   Thrombocytopenia 08/18/2021   SIRS (systemic inflammatory response syndrome) (HCC) 08/18/2021   Acute respiratory failure with hypoxia (HCC) 08/18/2021   Uncontrolled diabetes mellitus 05/29/2012   Influenza A H1N1 infection 05/28/2012   Acute bronchitis 05/27/2012   Asthma exacerbation 05/26/2012   Diabetes mellitus (HCC) 05/26/2012   Diarrhea 05/26/2012   Essential hypertension 01/05/2007   Headache 01/05/2007   CERVICAL CANCER, HX OF 01/05/2007   Home Medication(s) Prior to  Admission medications   Medication Sig Start Date End Date Taking? Authorizing Provider  albuterol  (VENTOLIN  HFA) 108 (90 Base) MCG/ACT inhaler Inhale 2 puffs into the lungs every 6 (six) hours as needed for wheezing or shortness of breath. 08/22/21   Jerri Keys, MD  arformoterol (BROVANA) 15 MCG/2ML NEBU Take 2 mLs (15 mcg total) by nebulization 2 (two) times daily. 03/24/24   Lue Elsie BROCKS, MD  Aspirin-Acetaminophen -Caffeine  (GOODYS EXTRA STRENGTH) 500-325-65 MG PACK Take 1 packet by mouth See admin instructions. Dissolve 1 packet in the mouth up to six times a day as needed for pain    [provider]  atorvastatin (LIPITOR) 20 MG tablet Take 20 mg by mouth daily.    [provider]  blood glucose meter kit and supplies KIT Dispense based on patient and insurance preference. Use up to four times daily as directed. 08/22/21   Jerri Keys, MD  budesonide (PULMICORT) 0.5 MG/2ML nebulizer solution Take 2 mLs (0.5 mg total) by nebulization 2 (two) times daily. 03/24/24   Lue Elsie BROCKS, MD  ciprofloxacin (CIPRO) 250 MG tablet Take 3 tablets (750 mg total) by mouth 2 (two) times daily for 10 days. 03/25/24 04/04/24  Lue Elsie BROCKS, MD  furosemide  (LASIX ) 20 MG tablet Take 20-40 mg by mouth daily as needed for fluid or edema.    [provider]  Insulin  Pen Needle 29G X MISC For insulin  injection 08/22/21   Jerri Keys, MD  losartan  (COZAAR ) 25 MG tablet Take 1 tablet (25 mg total) by mouth daily. 03/24/24   Lue Elsie BROCKS,  MD  metoprolol  succinate (TOPROL -XL) 25 MG 24 hr tablet Take 0.5 tablets (12.5 mg total) by mouth at bedtime. 03/24/24   Lue Elsie BROCKS, MD  OZEMPIC, 0.25 OR 0.5 MG/DOSE, 2 MG/3ML SOPN Inject 0.25 mg into the skin every Sunday.    [provider]  potassium chloride  SA (KLOR-CON  M) 20 MEQ tablet Take 20 mEq by mouth daily.    [provider]  predniSONE  (DELTASONE ) 10 MG tablet Take 4 tablets (40 mg total) by mouth daily for  3 days, THEN 3 tablets (30 mg total) daily for 3 days, THEN 2 tablets (20 mg total) daily for 3 days, THEN 1 tablet (10 mg total) daily for 3 days. 03/24/24 04/05/24  Lue Elsie BROCKS, MD  revefenacin (YUPELRI) 175 MCG/3ML nebulizer solution Take 3 mLs (175 mcg total) by nebulization daily. 03/25/24   Lue Elsie BROCKS, MD                                                                                                                                    Past Surgical History Past Surgical History:  Procedure Laterality Date   CESAREAN SECTION     JOINT REPLACEMENT     Left eye     TUBAL LIGATION     TUMOR REMOVAL from back     Family History Family History  Problem Relation Age of Onset   Stroke Mother    Lung cancer Father    Diabetes Mellitus II Sister     Social History Social History   Tobacco Use   Smoking status: Every Day    Current packs/day: 0.25    Types: Cigarettes   Smokeless tobacco: Never  Substance Use Topics   Alcohol use: No    Comment: last modearate drinking was 2 years ago   Drug use: Yes    Types: Marijuana    Comment: every now and then for medicinal purposes   Allergies Bee venom, Empagliflozin, Ibuprofen, and Statins  Review of Systems Review of Systems  All other systems reviewed and are negative.   Physical Exam Vital Signs  I have reviewed the triage vital signs BP 111/64   Pulse (!) 106   Temp 98.9 F (37.2 C)   Resp 17   Ht 5' 9 (1.753 m)   Wt 130.1 kg   LMP 06/12/2011   SpO2 94%   BMI 42.36 kg/m  Physical Exam Vitals and nursing note reviewed.  Constitutional:      General: She is in acute distress.     Appearance: She is well-developed.  HENT:     Head: Normocephalic and atraumatic.     Mouth/Throat:     Mouth: Mucous membranes are dry.  Eyes:     Pupils: Pupils are equal, round, and reactive to light.  Cardiovascular:     Rate and Rhythm: Regular rhythm. Tachycardia present.     Heart sounds: No murmur  heard. Pulmonary:  Effort: Tachypnea and accessory muscle usage present. No respiratory distress.     Breath sounds: Rhonchi (basilar) present.  Abdominal:     General: Abdomen is flat.     Palpations: Abdomen is soft.     Tenderness: There is abdominal tenderness (RLQ/LLQ).  Musculoskeletal:        General: No tenderness.     Right lower leg: No edema.     Left lower leg: No edema.  Skin:    General: Skin is warm and dry.  Neurological:     General: No focal deficit present.     Mental Status: She is alert. Mental status is at baseline.  Psychiatric:        Mood and Affect: Mood normal.        Behavior: Behavior normal.     ED Results and Treatments Labs (all labs ordered are listed, but only abnormal results are displayed) Labs Reviewed  COMPREHENSIVE METABOLIC PANEL WITH GFR - Abnormal; Notable for the following components:      Result Value   Potassium 3.3 (*)    Glucose, Bld 202 (*)    Total Protein 6.4 (*)    Albumin 2.9 (*)    Total Bilirubin 2.5 (*)    All other components within normal limits  CBC WITH DIFFERENTIAL/PLATELET - Abnormal; Notable for the following components:   RBC 5.44 (*)    Hemoglobin 16.8 (*)    HCT 52.3 (*)    Platelets 48 (*)    Neutro Abs 8.1 (*)    Lymphs Abs 0.3 (*)    Abs Immature Granulocytes 0.33 (*)    All other components within normal limits  BLOOD GAS, VENOUS - Abnormal; Notable for the following components:   pH, Ven 7.51 (*)    pCO2, Ven 37 (*)    pO2, Ven 62 (*)    Bicarbonate 29.5 (*)    Acid-Base Excess 6.3 (*)    All other components within normal limits  PRO BRAIN NATRIURETIC PEPTIDE - Abnormal; Notable for the following components:   Pro Brain Natriuretic Peptide 646.0 (*)    All other components within normal limits  PROTIME-INR - Abnormal; Notable for the following components:   Prothrombin Time 16.1 (*)    All other components within normal limits  I-STAT CG4 LACTIC ACID, ED - Abnormal; Notable for the  following components:   Lactic Acid, Venous 2.0 (*)    All other components within normal limits  CULTURE, BLOOD (ROUTINE X 2)  CULTURE, BLOOD (ROUTINE X 2)  HCG, SERUM, QUALITATIVE  LIPASE, BLOOD  URINALYSIS, W/ REFLEX TO CULTURE (INFECTION SUSPECTED)  I-STAT CHEM 8, ED  I-STAT CG4 LACTIC ACID, ED  TROPONIN T, HIGH SENSITIVITY  TROPONIN T, HIGH SENSITIVITY                                                                                                                          Radiology CT ABDOMEN PELVIS W CONTRAST Result Date: 03/25/2024 EXAM:  CT ABDOMEN AND PELVIS WITH CONTRAST 03/25/2024 09:55:59 PM TECHNIQUE: CT of the abdomen and pelvis was performed with the administration of 100 mL of iohexol  (OMNIPAQUE ) 350 MG/ML injection. Multiplanar reformatted images are provided for review. Automated exposure control, iterative reconstruction, and/or weight-based adjustment of the mA/kV was utilized to reduce the radiation dose to as low as reasonably achievable. COMPARISON: 04/01/2015 CLINICAL HISTORY: Abdominal pain, acute, nonlocalized. FINDINGS: LOWER CHEST: Basilar atelectasis. LIVER: The liver is unremarkable. GALLBLADDER AND BILE DUCTS: Gallbladder is unremarkable. No biliary ductal dilatation. SPLEEN: No acute abnormality. PANCREAS: No acute abnormality. ADRENAL GLANDS: Stable small nodules in the adrenal glands bilaterally, most compatible with adenomas. KIDNEYS, URETERS AND BLADDER: 1 m. m nonobstructing stone in the upper pole of the left kidney. 8 mm nonobstructing stone in the lower pole of the left kidney. No hydronephrosis. The urinary bladder is obscured by beam hardening artifact from the left hip replacement. Small bilateral renal cysts appear benign. Per consensus, no follow-up is needed for simple Bosniak type 1 and 2 renal cysts, unless the patient has a malignancy history or risk factors. No perinephric or periureteral stranding. GI AND BOWEL: Stomach demonstrates no acute  abnormality. Sigmoid diverticulosis. Inflammatory stranding around the mid sigmoid colon compatible with active diverticulitis. There is no bowel obstruction. PERITONEUM AND RETROPERITONEUM: No ascites. No free air. VASCULATURE: Aorta is normal in caliber. Aortic atherosclerosis. LYMPH NODES: No lymphadenopathy. REPRODUCTIVE ORGANS: No acute abnormality. BONES AND SOFT TISSUES: Prior left hip replacement. Degenerative changes in the lumbar spine. No acute bony abnormality. No focal soft tissue abnormality. IMPRESSION: 1. Sigmoid diverticulosis with active diverticulitis in the mid sigmoid colon. 2. Nonobstructing left nephrolithiasis. Electronically signed by: Franky Crease MD 03/25/2024 10:24 PM EST RP Workstation: [id number]   CT Angio Chest PE W and/or Wo Contrast Result Date: 03/25/2024 EXAM: CTA of the Chest with contrast for PE 03/25/2024 09:55:59 PM TECHNIQUE: CTA of the chest was performed after the administration of 100 mL of iohexol  (OMNIPAQUE ) 350 MG/ML injection. Multiplanar reformatted images are provided for review. MIP images are provided for review. Automated exposure control, iterative reconstruction, and/or weight based adjustment of the mA/kV was utilized to reduce the radiation dose to as low as reasonably achievable. COMPARISON: 08/18/2021 CLINICAL HISTORY: Pulmonary embolism (PE) suspected, high prob. FINDINGS: PULMONARY ARTERIES: Pulmonary arteries are adequately opacified for evaluation. No pulmonary embolism. Main pulmonary artery is normal in caliber. MEDIASTINUM: The heart and pericardium demonstrate no acute abnormality. Scattered coronary artery and aortic atherosclerosis. There is no acute abnormality of the thoracic aorta. LYMPH NODES: No mediastinal, hilar or axillary lymphadenopathy. LUNGS AND PLEURA: Areas of atelectasis in the mid and lower lungs. No focal consolidation or pulmonary edema. No pleural effusion or pneumothorax. UPPER ABDOMEN: Limited images of the upper abdomen are  unremarkable. SOFT TISSUES AND BONES: No acute bone or soft tissue abnormality. IMPRESSION: 1. No pulmonary embolism. 2. Areas of atelectasis in the mid and lower lungs. No effusions. Electronically signed by: Franky Crease MD 03/25/2024 10:19 PM EST RP Workstation: [id number]    Pertinent labs & imaging results that were available during my care of the patient were reviewed by me and considered in my medical decision making (see MDM for details).  Medications Ordered in ED Medications  vancomycin (VANCOREADY) IVPB 2000 mg/400 mL (2,000 mg Intravenous New Bag/Given 03/25/24 2124)  metroNIDAZOLE (FLAGYL) IVPB 500 mg (has no administration in time range)  sodium chloride  0.9 % bolus 500 mL (has no administration in time range)  lactated ringers bolus 1,000 mL (  1,000 mLs Intravenous New Bag/Given 03/25/24 2006)  ondansetron  (ZOFRAN ) injection 4 mg (4 mg Intravenous Given 03/25/24 1933)  HYDROmorphone (DILAUDID) injection 0.5 mg (0.5 mg Intravenous Given 03/25/24 1933)  ceFEPIme (MAXIPIME) 2 g in sodium chloride  0.9 % 100 mL IVPB (0 g Intravenous Stopped 03/25/24 2125)  iohexol  (OMNIPAQUE ) 350 MG/ML injection 100 mL (100 mLs Intravenous Contrast Given 03/25/24 2133)                                                                                                                                     Procedures Procedures  (including critical care time)  Medical Decision Making / ED Course   MDM:  63 year old presenting to the emergency department for feeling ill.  On exam, patient is ill-appearing.  Exam notable for new oxygen requirement, basilar rhonchi, lower abdominal tenderness.  Patient not febrile in ER but was reportedly febrile paramedics.  Reviewed patient's prior admission.  Her blood cultures were positive for E. coli and Pseudomonas.  Since discharge she reports that she has been vomiting and not being able to tolerate any antibiotics.  Presentation concerning for sepsis.  Will start with  smaller fluid bolus given underlying possible CHF.  Received Tylenol  by paramedics.  Given recent hospitalization, tachycardia and shortness of breath, hypoxia will check CTA to evaluate for PE.  She also has lower abdominal tenderness on exam, will check CT abdomen to evaluate for acute surgical process.  Has been having some diarrheal illness lately.  Anticipate patient will likely need to be admitted.    Clinical Course as of 03/25/24 2255  Mon Mar 25, 2024  2254 Workup notable for diverticulitis seen on CT abdomen.  CTA chest without evidence of PE or pneumonia.  Patient has received cefepime, Vanco.  Will add on Flagyl.  Will give small additional fluid bolus.  Patient did not receive 30 cc/kg fluid bolus given CHF and hypoxia.  Signed out to hospitalist Dr. Lou for admission  [WS]    Clinical Course User Index [WS] Francesca Elsie CROME, MD     Additional history obtained: -Additional history obtained from ems -External records from outside source obtained and reviewed including: Chart review including previous notes, labs, imaging, consultation notes including prior notes   Lab Tests: -I ordered, reviewed, and interpreted labs.   The pertinent results include:   Labs Reviewed  COMPREHENSIVE METABOLIC PANEL WITH GFR - Abnormal; Notable for the following components:      Result Value   Potassium 3.3 (*)    Glucose, Bld 202 (*)    Total Protein 6.4 (*)    Albumin 2.9 (*)    Total Bilirubin 2.5 (*)    All other components within normal limits  CBC WITH DIFFERENTIAL/PLATELET - Abnormal; Notable for the following components:   RBC 5.44 (*)    Hemoglobin 16.8 (*)    HCT 52.3 (*)    Platelets 48 (*)  Neutro Abs 8.1 (*)    Lymphs Abs 0.3 (*)    Abs Immature Granulocytes 0.33 (*)    All other components within normal limits  BLOOD GAS, VENOUS - Abnormal; Notable for the following components:   pH, Ven 7.51 (*)    pCO2, Ven 37 (*)    pO2, Ven 62 (*)    Bicarbonate 29.5 (*)     Acid-Base Excess 6.3 (*)    All other components within normal limits  PRO BRAIN NATRIURETIC PEPTIDE - Abnormal; Notable for the following components:   Pro Brain Natriuretic Peptide 646.0 (*)    All other components within normal limits  PROTIME-INR - Abnormal; Notable for the following components:   Prothrombin Time 16.1 (*)    All other components within normal limits  I-STAT CG4 LACTIC ACID, ED - Abnormal; Notable for the following components:   Lactic Acid, Venous 2.0 (*)    All other components within normal limits  CULTURE, BLOOD (ROUTINE X 2)  CULTURE, BLOOD (ROUTINE X 2)  HCG, SERUM, QUALITATIVE  LIPASE, BLOOD  URINALYSIS, W/ REFLEX TO CULTURE (INFECTION SUSPECTED)  I-STAT CHEM 8, ED  I-STAT CG4 LACTIC ACID, ED  TROPONIN T, HIGH SENSITIVITY  TROPONIN T, HIGH SENSITIVITY    Notable for hemoconcentration, lactic acidosis  EKG   EKG Interpretation Date/Time:  Monday March 25 2024 19:24:40 EST Ventricular Rate:  134 PR Interval:  145 QRS Duration:  84 QT Interval:  303 QTC Calculation: 453 R Axis:   -17  Text Interpretation: Sinus tachycardia Probable left atrial enlargement Borderline left axis deviation Probable anteroseptal infarct, old Baseline wander in lead(s) III Confirmed by Francesca Fallow ([id number]) on 03/25/2024 7:48:00 PM         Imaging Studies ordered: I ordered imaging studies including CT abdomen, CT chest On my interpretation imaging demonstrates diverticulitis  I independently visualized and interpreted imaging. I agree with the radiologist interpretation   Medicines ordered and prescription drug management: Meds ordered this encounter  Medications   lactated ringers bolus 1,000 mL   ondansetron  (ZOFRAN ) injection 4 mg   HYDROmorphone (DILAUDID) injection 0.5 mg   DISCONTD: vancomycin (VANCOCIN) IVPB 1000 mg/200 mL premix    Indication::   Other Indication (list below)    Other Indication::   HCAP   ceFEPIme (MAXIPIME) 2 g in sodium  chloride 0.9 % 100 mL IVPB    Antibiotic Indication::   HCAP   vancomycin (VANCOREADY) IVPB 2000 mg/400 mL    Indication::   Pneumonia   iohexol  (OMNIPAQUE ) 350 MG/ML injection 100 mL   metroNIDAZOLE (FLAGYL) IVPB 500 mg    Antibiotic Indication::   Intra-abdominal Infection   sodium chloride  0.9 % bolus 500 mL    -I have reviewed the patients home medicines and have made adjustments as needed   Consultations Obtained: I requested consultation with the hospitalist,  and discussed lab and imaging findings as well as pertinent plan - they recommend: admission   Cardiac Monitoring: The patient was maintained on a cardiac monitor.  I personally viewed and interpreted the cardiac monitored which showed an underlying rhythm of: sinus tachycardia   Social Determinants of Health:  Diagnosis or treatment significantly limited by social determinants of health: obesity   Reevaluation: After the interventions noted above, I reevaluated the patient and found that their symptoms have improved  Co morbidities that complicate the patient evaluation  Past Medical History:  Diagnosis Date   Asthma    Cancer (HCC)    Cannabis abuse  Chronic pain    Diabetes mellitus type 2, controlled (HCC)    Hypertension       Dispostion: Disposition decision including need for hospitalization was considered, and patient admitted to the hospital.    Final Clinical Impression(s) / ED Diagnoses Final diagnoses:  Sepsis with acute hypoxic respiratory failure without septic shock, due to unspecified organism [facility])  Diverticulitis     This chart was dictated using voice recognition software.  Despite best efforts to proofread,  errors can occur which can change the documentation meaning.    Francesca Elsie CROME, MD 03/25/24 2255

## 2024-03-25 NOTE — Sepsis Progress Note (Signed)
 Elink monitoring for the code sepsis protocol.

## 2024-03-25 NOTE — Progress Notes (Incomplete)
 PsA - adjust abx, watch sensi

## 2024-03-25 NOTE — ED Triage Notes (Signed)
 Pt BIBA from home, c/o weakness, NVD, was discharged on Sunday with diagnose of ecoli , pt had a assisted fall at home today.  650mg  tylenol  450 LR 4mg  zofran  18G LAC 20 RW   BP 138/84 HR 150 O2 89-92 baseline 2L CBG 165

## 2024-03-26 ENCOUNTER — Encounter (HOSPITAL_COMMUNITY): Payer: Self-pay

## 2024-03-26 DIAGNOSIS — E66813 Obesity, class 3: Secondary | ICD-10-CM

## 2024-03-26 DIAGNOSIS — J441 Chronic obstructive pulmonary disease with (acute) exacerbation: Secondary | ICD-10-CM

## 2024-03-26 DIAGNOSIS — A419 Sepsis, unspecified organism: Secondary | ICD-10-CM | POA: Diagnosis not present

## 2024-03-26 DIAGNOSIS — K5792 Diverticulitis of intestine, part unspecified, without perforation or abscess without bleeding: Secondary | ICD-10-CM

## 2024-03-26 DIAGNOSIS — R531 Weakness: Secondary | ICD-10-CM

## 2024-03-26 DIAGNOSIS — I5032 Chronic diastolic (congestive) heart failure: Secondary | ICD-10-CM

## 2024-03-26 LAB — CBC
HCT: 47.3 % — ABNORMAL HIGH (ref 36.0–46.0)
Hemoglobin: 15.2 g/dL — ABNORMAL HIGH (ref 12.0–15.0)
MCH: 31.7 pg (ref 26.0–34.0)
MCHC: 32.1 g/dL (ref 30.0–36.0)
MCV: 98.7 fL (ref 80.0–100.0)
Platelets: 52 K/uL — ABNORMAL LOW (ref 150–400)
RBC: 4.79 MIL/uL (ref 3.87–5.11)
RDW: 13.4 % (ref 11.5–15.5)
WBC: 14.1 K/uL — ABNORMAL HIGH (ref 4.0–10.5)
nRBC: 0 % (ref 0.0–0.2)

## 2024-03-26 LAB — GLUCOSE, CAPILLARY
Glucose-Capillary: 252 mg/dL — ABNORMAL HIGH (ref 70–99)
Glucose-Capillary: 241 mg/dL — ABNORMAL HIGH (ref 70–99)
Glucose-Capillary: 207 mg/dL — ABNORMAL HIGH (ref 70–99)
Glucose-Capillary: 282 mg/dL — ABNORMAL HIGH (ref 70–99)

## 2024-03-26 LAB — PROCALCITONIN: Procalcitonin: 14 ng/mL

## 2024-03-26 LAB — BASIC METABOLIC PANEL WITH GFR
Anion gap: 10 (ref 5–15)
BUN: 15 mg/dL (ref 8–23)
CO2: 27 mmol/L (ref 22–32)
Calcium: 8.8 mg/dL — ABNORMAL LOW (ref 8.9–10.3)
Chloride: 100 mmol/L (ref 98–111)
Creatinine, Ser: 0.88 mg/dL (ref 0.44–1.00)
GFR, Estimated: >60 mL/min
Glucose, Bld: 241 mg/dL — ABNORMAL HIGH (ref 70–99)
Potassium: 3.5 mmol/L (ref 3.5–5.1)
Sodium: 136 mmol/L (ref 135–145)

## 2024-03-26 LAB — MRSA NEXT GEN BY PCR, NASAL: MRSA by PCR Next Gen: NOT DETECTED

## 2024-03-26 MED ORDER — LOSARTAN POTASSIUM 50 MG PO TABS: 25.0000 mg | ORAL_TABLET | Freq: Every day | ORAL | Status: DC

## 2024-03-26 MED ORDER — ARFORMOTEROL TARTRATE 15 MCG/2ML IN NEBU
15.0000 ug | INHALATION_SOLUTION | Freq: Two times a day (BID) | RESPIRATORY_TRACT | Status: DC
  Administered 2024-03-26 – 2024-03-29 (×7): 15 ug via RESPIRATORY_TRACT
  Filled 2024-03-26 (×7): qty 2

## 2024-03-26 MED ORDER — IPRATROPIUM-ALBUTEROL 0.5-2.5 (3) MG/3ML IN SOLN: 3.0000 mL | Freq: Four times a day (QID) | RESPIRATORY_TRACT | Status: DC | PRN

## 2024-03-26 MED ORDER — POTASSIUM CHLORIDE CRYS ER 20 MEQ PO TBCR
20.0000 meq | EXTENDED_RELEASE_TABLET | Freq: Every day | ORAL | Status: DC
  Administered 2024-03-27 – 2024-03-29 (×3): 20 meq via ORAL
  Filled 2024-03-26 (×3): qty 1

## 2024-03-26 MED ORDER — ORAL CARE MOUTH RINSE: 15.0000 mL | OROMUCOSAL | Status: DC | PRN

## 2024-03-26 MED ORDER — INSULIN ASPART 100 UNIT/ML IJ SOLN
0.0000 [IU] | Freq: Every day | INTRAMUSCULAR | Status: DC
  Administered 2024-03-26 – 2024-03-27 (×2): 3 [IU] via SUBCUTANEOUS
  Administered 2024-03-28: 5 [IU] via SUBCUTANEOUS
  Filled 2024-03-26 (×2): qty 3
  Filled 2024-03-26: qty 5

## 2024-03-26 MED ORDER — METHYLPREDNISOLONE SODIUM SUCC 40 MG IJ SOLR
40.0000 mg | Freq: Two times a day (BID) | INTRAMUSCULAR | Status: DC
  Administered 2024-03-26: 40 mg via INTRAVENOUS
  Filled 2024-03-26: qty 1

## 2024-03-26 MED ORDER — BUDESONIDE 0.5 MG/2ML IN SUSP
0.5000 mg | Freq: Two times a day (BID) | RESPIRATORY_TRACT | Status: DC
  Administered 2024-03-26 – 2024-03-29 (×7): 0.5 mg via RESPIRATORY_TRACT
  Filled 2024-03-26 (×7): qty 2

## 2024-03-26 MED ORDER — INSULIN ASPART 100 UNIT/ML IJ SOLN
0.0000 [IU] | Freq: Three times a day (TID) | INTRAMUSCULAR | Status: DC
  Administered 2024-03-26 (×2): 5 [IU] via SUBCUTANEOUS
  Administered 2024-03-26: 8 [IU] via SUBCUTANEOUS
  Administered 2024-03-27: 11 [IU] via SUBCUTANEOUS
  Administered 2024-03-27: 3 [IU] via SUBCUTANEOUS
  Administered 2024-03-27: 11 [IU] via SUBCUTANEOUS
  Administered 2024-03-28: 5 [IU] via SUBCUTANEOUS
  Administered 2024-03-28 – 2024-03-29 (×3): 8 [IU] via SUBCUTANEOUS
  Filled 2024-03-26 (×2): qty 5
  Filled 2024-03-26: qty 8
  Filled 2024-03-26: qty 5
  Filled 2024-03-26: qty 11
  Filled 2024-03-26 (×3): qty 8
  Filled 2024-03-26: qty 11
  Filled 2024-03-26: qty 3

## 2024-03-26 MED ORDER — HYDROMORPHONE HCL 1 MG/ML IJ SOLN
0.5000 mg | Freq: Four times a day (QID) | INTRAMUSCULAR | Status: DC | PRN
  Administered 2024-03-26 – 2024-03-27 (×2): 0.5 mg via INTRAVENOUS
  Filled 2024-03-26 (×3): qty 0.5

## 2024-03-26 MED ORDER — PIPERACILLIN-TAZOBACTAM 3.375 G IVPB
3.3750 g | Freq: Three times a day (TID) | INTRAVENOUS | Status: DC
  Administered 2024-03-26 – 2024-03-29 (×9): 3.375 g via INTRAVENOUS
  Filled 2024-03-26 (×9): qty 50

## 2024-03-26 MED ORDER — SIMETHICONE 80 MG PO CHEW
80.0000 mg | CHEWABLE_TABLET | Freq: Four times a day (QID) | ORAL | Status: DC
  Administered 2024-03-26 – 2024-03-29 (×12): 80 mg via ORAL
  Filled 2024-03-26 (×12): qty 1

## 2024-03-26 MED ORDER — PREDNISONE 20 MG PO TABS
40.0000 mg | ORAL_TABLET | Freq: Every day | ORAL | Status: DC
  Administered 2024-03-27: 40 mg via ORAL
  Filled 2024-03-26: qty 2

## 2024-03-26 MED ORDER — REVEFENACIN 175 MCG/3ML IN SOLN
175.0000 ug | Freq: Every day | RESPIRATORY_TRACT | Status: DC
  Administered 2024-03-26 – 2024-03-29 (×4): 175 ug via RESPIRATORY_TRACT
  Filled 2024-03-26 (×4): qty 3

## 2024-03-26 MED ORDER — ATORVASTATIN CALCIUM 10 MG PO TABS
20.0000 mg | ORAL_TABLET | Freq: Every day | ORAL | Status: DC
  Administered 2024-03-26 – 2024-03-29 (×4): 20 mg via ORAL
  Filled 2024-03-26 (×3): qty 2
  Filled 2024-03-26: qty 1

## 2024-03-26 MED ORDER — METOPROLOL SUCCINATE ER 25 MG PO TB24: 12.5000 mg | ORAL_TABLET | Freq: Every day | ORAL | Status: DC

## 2024-03-26 NOTE — Progress Notes (Signed)
 PROGRESS NOTE  Lisa Ibarra  FMW:[id number] DOB: Oct 30, 1960 DOA: 03/25/2024 PCP: Cesario Mutton, MD   Brief Narrative: Patient is a 63 year old female with history of diabetes type 2, morbid obesity, chronic pain syndrome, hypertension, asthma/COPD, tobacco use disorder, chronic diastolic CHF, thrombocytopenia who presented for evaluation of generalized weakness, abdomen pain, nausea, vomiting.  She was recently hospitalized here and was discharged on 11/2 after treatment for acute hypoxic respiratory failure secondary to COPD/exacerbation.  Blood cultures sent during that hospitalization had shown E. coli, Pseudomonas.  Also reported subjective fever at home.  On presentation ,she was in sinus tachycardia, mildly hypertensive, requiring 4 L of oxygen per minute.  Lab work showed potassium of 3.3, platelet of 48, lactic acid of 2.  CTA PE study negative for PE but showed atelectasis in the mid and lower lungs.  CT abdomen/pelvis showed sigmoid diverticulosis with active diverticulitis in the mid sigmoid colon.  Started on IV fluids, broad-spectrum antibiotics,culture sent.  Clinically improving.Weaned to room air today.  Assessment & Plan:  Principal Problem:   Sepsis (HCC) Active Problems:   Acute hypoxic respiratory failure (HCC)   Acute diverticulitis   Generalized weakness   Obesity, Class III, BMI 40-49.9 (morbid obesity) (HCC)   Chronic diastolic CHF (congestive heart failure) (HCC)   Asthma with COPD with exacerbation (HCC)  Sepsis secondary to acute diverticulitis: Presented with generalized weakness, subjective fever, abdomen pain, nausea, vomiting.  Lab work showed leukocytosis, elevated lactate.  Patient was hypotensive, tachycardic on presentation.  CT abdomen/pelvis showed acute diverticulitis in the mid sigmoid colon.  Started on IV fluid, broad spectrum antibiotics.  Follow-up cultures. Elevated procalcitonin. Feels much better today.  Abdomen is soft and nontender.  No abdominal pain  today.  Tolerating solid diet.  Gram-negative bacteremia: Blood culture on last hospitalization showed Pseudomonas, E. coli.  Both organisms were pansensitive.  Currently on Zosyn.  Continue the same.  Follow-up repeat cultures sent during this hospitalization  Acute hypoxic respiratory failure: Not on oxygen at home.  Hypoxic on presentation.  Also reported productive cough, low-grade fever.  CTA did not show any evidence of pneumonia but showed atelectasis.  This could be likely from COPD exacerbation .  Continue bronchodilators, incentive spirometer.  Weaned to room air   COPD/asthma exacerbation: Started  Solu-Medrol , bronchodilators.  No wheezing today, steroids changed to oral  Hypokalemia: Currently being monitored and supplemented as needed  Chronic diastolic CHF: Appears euvolemic on presentation.  proBNP slightly elevated.  On gentle IV fluids  Chronic thrombocytopenia: Developed thrombocytopenia on last hospitalization.  CBC showed normal smear, DIC ruled out.  Liver imaging unremarkable.  Likely from sepsis.  Continue to monitor  Type 2 diabetes: Recent A1c of 7.  Continue sliding scale.  Monitor blood sugars  Hypertension: Hypotensive in presentation.  Currently blood pressure stable.  On losartan , Toprol  at home  Hyperlipidemia: On Lipitor  Tobacco use disorder: Counseled cessation.  Generalized weakness: PT consulted  Morbid obesity: BMI 42.3          DVT prophylaxis:SCDs Start: 03/25/24 2341     Code Status: Full Code  Family Communication: None at bedside.  Patient says no need to call her daughter.  Patient status:Inpatient  Patient is from :Home  Anticipated discharge to: Home  Estimated DC date: 1 to 2 days   Consultants: None  Procedures:None  Antimicrobials:  Anti-infectives (From admission, onward)    Start     Dose/Rate Route Frequency Ordered Stop   03/26/24 1000  piperacillin-tazobactam (ZOSYN) IVPB  3.375 g        3.375 g 12.5 mL/hr  over 240 Minutes Intravenous Every 8 hours 03/26/24 0211     03/25/24 2245  metroNIDAZOLE (FLAGYL) IVPB 500 mg        500 mg 100 mL/hr over 60 Minutes Intravenous  Once 03/25/24 2241 03/26/24 0251   03/25/24 2045  vancomycin (VANCOCIN) IVPB 1000 mg/200 mL premix  Status:  Discontinued        1,000 mg 200 mL/hr over 60 Minutes Intravenous  Once 03/25/24 2035 03/25/24 2036   03/25/24 2045  ceFEPIme (MAXIPIME) 2 g in sodium chloride  0.9 % 100 mL IVPB        2 g 200 mL/hr over 30 Minutes Intravenous  Once 03/25/24 2035 03/25/24 2125   03/25/24 2045  vancomycin (VANCOREADY) IVPB 2000 mg/400 mL        2,000 mg 200 mL/hr over 120 Minutes Intravenous  Once 03/25/24 2036 03/26/24 0003       Subjective: Patient seen and examined at bedside today.  Hemodynamically stable.  Improved this morning.  Ate all her breakfast.  Feels much better today.  No abdominal pain, nausea or vomiting today.  Was on room air during my evaluation, weaned to room air after that.  Objective: Vitals:   03/26/24 0040 03/26/24 0100 03/26/24 0345 03/26/24 0725  BP:  116/75    Pulse:      Resp: (!) 23 (!) 26    Temp: 98.4 F (36.9 C)  98.3 F (36.8 C) (P) 98 F (36.7 C)  TempSrc: Oral  Oral (P) Oral  SpO2:      Weight:      Height:        Intake/Output Summary (Last 24 hours) at 03/26/2024 0738 Last data filed at 03/26/2024 0300 Gross per 24 hour  Intake 1020.07 ml  Output 300 ml  Net 720.07 ml   Filed Weights   03/25/24 1901  Weight: 130.1 kg    Examination:  General exam: Overall comfortable, not in distress,morbidly obese HEENT: PERRL Respiratory system:  no wheezes or crackles  Cardiovascular system: S1 & S2 heard, RRR.  Gastrointestinal system: Abdomen is mildly distended, soft and nontender. Central nervous system: Alert and oriented Extremities: No edema, no clubbing ,no cyanosis Skin: No rashes, no ulcers,no icterus     Data Reviewed: I have personally reviewed following labs and imaging  studies  CBC: Recent Labs  Lab 03/21/24 1310 03/21/24 1803 03/22/24 0313 03/23/24 0454 03/25/24 1938 03/26/24 0057  WBC 14.4* 20.1* 18.8* 12.8* 9.2 14.1*  NEUTROABS 13.4* 18.8*  --   --  8.1*  --   HGB 18.6* 17.9* 17.0* 15.9* 16.8* 15.2*  HCT 59.4* 57.0* 53.5* 52.1* 52.3* 47.3*  MCV 99.8 99.7 100.9* 101.4* 96.1 98.7  PLT 84* 86* 81* 61* 48* 52*   Basic Metabolic Panel: Recent Labs  Lab 03/21/24 1310 03/22/24 0313 03/23/24 0454 03/25/24 1938 03/26/24 0057  NA 139 138 137 138 136  K 3.5 4.1 4.5 3.3* 3.5  CL 104 104 103 100 100  CO2 20* 25 27 26 27   GLUCOSE 179* 193* 157* 202* 241*  BUN 19 23 21 14 15   CREATININE 1.16* 0.94 0.85 0.88 0.88  CALCIUM 9.2 8.9 9.3 9.2 8.8*  MG  --  1.8 2.3  --   --   PHOS  --  3.4 2.7  --   --      Recent Results (from the past 240 hours)  Resp panel by RT-PCR (RSV,  Flu A&B, Covid) Anterior Nasal Swab     Status: None   Collection Time: 03/21/24  1:10 PM   Specimen: Anterior Nasal Swab  Result Value Ref Range Status   SARS Coronavirus 2 by RT PCR NEGATIVE NEGATIVE Final    Comment: (NOTE) SARS-CoV-2 target nucleic acids are NOT DETECTED.  The SARS-CoV-2 RNA is generally detectable in upper respiratory specimens during the acute phase of infection. The lowest concentration of SARS-CoV-2 viral copies this assay can detect is 138 copies/mL. A negative result does not preclude SARS-Cov-2 infection and should not be used as the sole basis for treatment or other patient management decisions. A negative result may occur with  improper specimen collection/handling, submission of specimen other than nasopharyngeal swab, presence of viral mutation(s) within the areas targeted by this assay, and inadequate number of viral copies(<138 copies/mL). A negative result must be combined with clinical observations, patient history, and epidemiological information. The expected result is Negative.  Fact Sheet for Patients:   bloggercourse.com  Fact Sheet for Healthcare Providers:  seriousbroker.it  This test is no t yet approved or cleared by the [place]  FDA and  has been authorized for detection and/or diagnosis of SARS-CoV-2 by FDA under an Emergency Use Authorization (EUA). This EUA will remain  in effect (meaning this test can be used) for the duration of the COVID-19 declaration under Section 564(b)(1) of the Act, 21 U.S.C.section 360bbb-3(b)(1), unless the authorization is terminated  or revoked sooner.       Influenza A by PCR NEGATIVE NEGATIVE Final   Influenza B by PCR NEGATIVE NEGATIVE Final    Comment: (NOTE) The Xpert Xpress SARS-CoV-2/FLU/RSV plus assay is intended as an aid in the diagnosis of influenza from Nasopharyngeal swab specimens and should not be used as a sole basis for treatment. Nasal washings and aspirates are unacceptable for Xpert Xpress SARS-CoV-2/FLU/RSV testing.  Fact Sheet for Patients: bloggercourse.com  Fact Sheet for Healthcare Providers: seriousbroker.it  This test is not yet approved or cleared by the [place]  FDA and has been authorized for detection and/or diagnosis of SARS-CoV-2 by FDA under an Emergency Use Authorization (EUA). This EUA will remain in effect (meaning this test can be used) for the duration of the COVID-19 declaration under Section 564(b)(1) of the Act, 21 U.S.C. section 360bbb-3(b)(1), unless the authorization is terminated or revoked.     Resp Syncytial Virus by PCR NEGATIVE NEGATIVE Final    Comment: (NOTE) Fact Sheet for Patients: bloggercourse.com  Fact Sheet for Healthcare Providers: seriousbroker.it  This test is not yet approved or cleared by the [place]  FDA and has been authorized for detection and/or diagnosis of SARS-CoV-2 by FDA under an Emergency Use  Authorization (EUA). This EUA will remain in effect (meaning this test can be used) for the duration of the COVID-19 declaration under Section 564(b)(1) of the Act, 21 U.S.C. section 360bbb-3(b)(1), unless the authorization is terminated or revoked.  Performed at [facility], [address], [place], [place] [address]   Blood Culture (routine x 2)     Status: None (Preliminary result)   Collection Time: 03/21/24  1:10 PM   Specimen: BLOOD  Result Value Ref Range Status   Specimen Description   Final    BLOOD RIGHT ANTECUBITAL Performed at [facility], [address] [place], [place] [address]    Special Requests   Final    BOTTLES DRAWN AEROBIC AND ANAEROBIC Blood Culture results may not be optimal due to an inadequate  volume of blood received in culture bottles Performed at [facility], [address], [place], [place] [address]    Culture  Setup Time   Final    GRAM NEGATIVE RODS IN BOTH AEROBIC AND ANAEROBIC BOTTLES CRITICAL VALUE NOTED.  VALUE IS CONSISTENT WITH PREVIOUSLY REPORTED AND CALLED VALUE.    Culture   Final    GRAM NEGATIVE RODS PSEUDOMONAS AERUGINOSA SUSCEPTIBILITIES PERFORMED ON PREVIOUS CULTURE WITHIN THE LAST 5 DAYS. CULTURE REINCUBATED FOR BETTER GROWTH Performed at [facility], [address], [place], [place] [address]    Report Status PENDING  Incomplete  Blood Culture (routine x 2)     Status: None   Collection Time: 03/21/24  1:10 PM   Specimen: BLOOD  Result Value Ref Range Status   Specimen Description   Final    BLOOD LEFT ANTECUBITAL Performed at [facility], [address], [place], [place] [address]    Special Requests   Final    BOTTLES DRAWN AEROBIC AND ANAEROBIC Blood Culture results may not be optimal due to an inadequate volume of blood received in culture bottles Performed at [facility], [address], [place], [place]  [address]    Culture  Setup Time   Final    GRAM NEGATIVE RODS IN BOTH AEROBIC AND ANAEROBIC BOTTLES CRITICAL RESULT CALLED TO, READ BACK BY AND VERIFIED WITH: PHARMD ADESOUA UTOMWEN ON 03/22/24 @ 1317 BY DRT Performed at [facility], [address] [place], [place] [address]    Culture   Final    Two isolates with different morphologies were identified as the same organism.The most resistant organism was reported. ESCHERICHIA COLI PSEUDOMONAS AERUGINOSA    Report Status 03/25/2024 FINAL  Final   Organism ID, Bacteria ESCHERICHIA COLI  Final   Organism ID, Bacteria PSEUDOMONAS AERUGINOSA  Final      Susceptibility   Escherichia coli - MIC*    AMPICILLIN 4 SENSITIVE Sensitive     CEFAZOLIN (NON-URINE) <=1 SENSITIVE Sensitive     CEFEPIME <=0.12 SENSITIVE Sensitive     ERTAPENEM <=0.12 SENSITIVE Sensitive     CEFTRIAXONE <=0.25 SENSITIVE Sensitive     CIPROFLOXACIN <=0.06 SENSITIVE Sensitive     GENTAMICIN <=1 SENSITIVE Sensitive     MEROPENEM <=0.25 SENSITIVE Sensitive     TRIMETH/SULFA <=20 SENSITIVE Sensitive     AMPICILLIN/SULBACTAM <=2 SENSITIVE Sensitive     PIP/TAZO Value in next row Sensitive      <=4 SENSITIVEThis is a modified FDA-approved test that has been validated and its performance characteristics determined by the reporting laboratory.  This laboratory is certified under the Clinical Laboratory Improvement Amendments CLIA as qualified to perform high complexity clinical laboratory testing.    * ESCHERICHIA COLI   Pseudomonas aeruginosa - MIC*    MEROPENEM Value in next row Sensitive      <=4 SENSITIVEThis is a modified FDA-approved test that has been validated and its performance characteristics determined by the reporting laboratory.  This laboratory is certified under the Clinical Laboratory Improvement Amendments CLIA as qualified to perform high complexity clinical laboratory testing.    CIPROFLOXACIN Value in next row Sensitive      <=4 SENSITIVEThis is a  modified FDA-approved test that has been validated and its performance characteristics determined by the reporting laboratory.  This laboratory is certified under the Clinical Laboratory Improvement Amendments CLIA as qualified to perform high complexity clinical laboratory testing.    IMIPENEM Value in next row  Sensitive      <=4 SENSITIVEThis is a modified FDA-approved test that has been validated and its performance characteristics determined by the reporting laboratory.  This laboratory is certified under the Clinical Laboratory Improvement Amendments CLIA as qualified to perform high complexity clinical laboratory testing.    PIP/TAZO Value in next row Sensitive      <=4 SENSITIVEThis is a modified FDA-approved test that has been validated and its performance characteristics determined by the reporting laboratory.  This laboratory is certified under the Clinical Laboratory Improvement Amendments CLIA as qualified to perform high complexity clinical laboratory testing.    CEFEPIME Value in next row Sensitive      <=4 SENSITIVEThis is a modified FDA-approved test that has been validated and its performance characteristics determined by the reporting laboratory.  This laboratory is certified under the Clinical Laboratory Improvement Amendments CLIA as qualified to perform high complexity clinical laboratory testing.    CEFTAZIDIME/AVIBACTAM Value in next row Sensitive      <=4 SENSITIVEThis is a modified FDA-approved test that has been validated and its performance characteristics determined by the reporting laboratory.  This laboratory is certified under the Clinical Laboratory Improvement Amendments CLIA as qualified to perform high complexity clinical laboratory testing.    CEFTOLOZANE/TAZOBACTAM Value in next row Sensitive      <=4 SENSITIVEThis is a modified FDA-approved test that has been validated and its performance characteristics determined by the reporting laboratory.  This laboratory is  certified under the Clinical Laboratory Improvement Amendments CLIA as qualified to perform high complexity clinical laboratory testing.    TOBRAMYCIN Value in next row Sensitive      <=4 SENSITIVEThis is a modified FDA-approved test that has been validated and its performance characteristics determined by the reporting laboratory.  This laboratory is certified under the Clinical Laboratory Improvement Amendments CLIA as qualified to perform high complexity clinical laboratory testing.    CEFTAZIDIME Value in next row Sensitive      <=4 SENSITIVEThis is a modified FDA-approved test that has been validated and its performance characteristics determined by the reporting laboratory.  This laboratory is certified under the Clinical Laboratory Improvement Amendments CLIA as qualified to perform high complexity clinical laboratory testing.    * PSEUDOMONAS AERUGINOSA  Respiratory (~20 pathogens) panel by PCR     Status: None   Collection Time: 03/21/24  1:10 PM   Specimen: Nasopharyngeal Swab; Respiratory  Result Value Ref Range Status   Adenovirus NOT DETECTED NOT DETECTED Final   Coronavirus 229E NOT DETECTED NOT DETECTED Final    Comment: (NOTE) The Coronavirus on the Respiratory Panel, DOES NOT test for the novel  Coronavirus (2019 nCoV)    Coronavirus HKU1 NOT DETECTED NOT DETECTED Final   Coronavirus NL63 NOT DETECTED NOT DETECTED Final   Coronavirus OC43 NOT DETECTED NOT DETECTED Final   Metapneumovirus NOT DETECTED NOT DETECTED Final   Rhinovirus / Enterovirus NOT DETECTED NOT DETECTED Final   Influenza A NOT DETECTED NOT DETECTED Final   Influenza B NOT DETECTED NOT DETECTED Final   Parainfluenza Virus 1 NOT DETECTED NOT DETECTED Final   Parainfluenza Virus 2 NOT DETECTED NOT DETECTED Final   Parainfluenza Virus 3 NOT DETECTED NOT DETECTED Final   Parainfluenza Virus 4 NOT DETECTED NOT DETECTED Final   Respiratory Syncytial Virus NOT DETECTED NOT DETECTED Final   Bordetella pertussis  NOT DETECTED NOT DETECTED Final   Bordetella Parapertussis NOT DETECTED NOT DETECTED Final   Chlamydophila pneumoniae NOT DETECTED NOT DETECTED Final   Mycoplasma  pneumoniae NOT DETECTED NOT DETECTED Final    Comment: Performed at [facility], [address] [place], [place] [address]  Blood Culture ID Panel (Reflexed)     Status: Abnormal   Collection Time: 03/21/24  1:10 PM  Result Value Ref Range Status   Enterococcus faecalis NOT DETECTED NOT DETECTED Final   Enterococcus Faecium NOT DETECTED NOT DETECTED Final   Listeria monocytogenes NOT DETECTED NOT DETECTED Final   Staphylococcus species NOT DETECTED NOT DETECTED Final   Staphylococcus aureus (BCID) NOT DETECTED NOT DETECTED Final   Staphylococcus epidermidis NOT DETECTED NOT DETECTED Final   Staphylococcus lugdunensis NOT DETECTED NOT DETECTED Final   Streptococcus species NOT DETECTED NOT DETECTED Final   Streptococcus agalactiae NOT DETECTED NOT DETECTED Final   Streptococcus pneumoniae NOT DETECTED NOT DETECTED Final   Streptococcus pyogenes NOT DETECTED NOT DETECTED Final   A.calcoaceticus-baumannii NOT DETECTED NOT DETECTED Final   Bacteroides fragilis NOT DETECTED NOT DETECTED Final   Enterobacterales DETECTED (A) NOT DETECTED Final    Comment: Enterobacterales represent a large order of gram negative bacteria, not a single organism. CRITICAL RESULT CALLED TO, READ BACK BY AND VERIFIED WITH: PHARMD ADESOUA UTOMWEN ON 03/22/24 @ 1346 BY DRT    Enterobacter cloacae complex NOT DETECTED NOT DETECTED Final   Escherichia coli DETECTED (A) NOT DETECTED Final    Comment: CRITICAL RESULT CALLED TO, READ BACK BY AND VERIFIED WITH: PHARMD ADESOUA UTOMWEN ON 03/22/24 @ 1346 BY DRT    Klebsiella aerogenes NOT DETECTED NOT DETECTED Final   Klebsiella oxytoca NOT DETECTED NOT DETECTED Final   Klebsiella pneumoniae NOT DETECTED NOT DETECTED Final   Proteus species NOT DETECTED NOT DETECTED Final   Salmonella species NOT  DETECTED NOT DETECTED Final   Serratia marcescens NOT DETECTED NOT DETECTED Final   Haemophilus influenzae NOT DETECTED NOT DETECTED Final   Neisseria meningitidis NOT DETECTED NOT DETECTED Final   Pseudomonas aeruginosa DETECTED (A) NOT DETECTED Final    Comment: CRITICAL RESULT CALLED TO, READ BACK BY AND VERIFIED WITH: PHARMD ADESOUA UTOMWEN ON 03/22/24 @ 1345 BY DRT    Stenotrophomonas maltophilia NOT DETECTED NOT DETECTED Final   Candida albicans NOT DETECTED NOT DETECTED Final   Candida auris NOT DETECTED NOT DETECTED Final   Candida glabrata NOT DETECTED NOT DETECTED Final   Candida krusei NOT DETECTED NOT DETECTED Final   Candida parapsilosis NOT DETECTED NOT DETECTED Final   Candida tropicalis NOT DETECTED NOT DETECTED Final   Cryptococcus neoformans/gattii NOT DETECTED NOT DETECTED Final   CTX-M ESBL NOT DETECTED NOT DETECTED Final   Carbapenem resistance IMP NOT DETECTED NOT DETECTED Final   Carbapenem resistance KPC NOT DETECTED NOT DETECTED Final   Carbapenem resistance NDM NOT DETECTED NOT DETECTED Final   Carbapenem resist OXA 48 LIKE NOT DETECTED NOT DETECTED Final   Carbapenem resistance VIM NOT DETECTED NOT DETECTED Final    Comment: Performed at [facility], [address] [place], [place] [address]  MRSA Next Gen by PCR, Nasal     Status: None   Collection Time: 03/21/24  5:49 PM   Specimen: Nasal Mucosa; Nasal Swab  Result Value Ref Range Status   MRSA by PCR Next Gen NOT DETECTED NOT DETECTED Final    Comment: (NOTE) The GeneXpert MRSA Assay (FDA approved for NASAL specimens only), is one component of a comprehensive MRSA colonization surveillance program. It is not intended to diagnose MRSA infection nor to guide or monitor treatment for MRSA infections. Test performance  is not FDA approved in patients less than 38 years old. Performed at [facility], [address], [place], [place] [address]   Culture, blood (routine x 2)      Status: None (Preliminary result)   Collection Time: 03/25/24  8:08 PM   Specimen: BLOOD LEFT HAND  Result Value Ref Range Status   Specimen Description   Final    BLOOD LEFT HAND Performed at [facility], [address], [place], [place] [address]    Special Requests   Final    BOTTLES DRAWN AEROBIC AND ANAEROBIC Blood Culture adequate volume Performed at [facility], [address] [place], [place] [address]    Culture   Final    NO GROWTH < 12 HOURS Performed at [facility], [address] [place], [place] [address]    Report Status PENDING  Incomplete  MRSA Next Gen by PCR, Nasal     Status: None   Collection Time: 03/26/24 12:31 AM   Specimen: Nasal Mucosa; Nasal Swab  Result Value Ref Range Status   MRSA by PCR Next Gen NOT DETECTED NOT DETECTED Final    Comment: (NOTE) The GeneXpert MRSA Assay (FDA approved for NASAL specimens only), is one component of a comprehensive MRSA colonization surveillance program. It is not intended to diagnose MRSA infection nor to guide or monitor treatment for MRSA infections. Test performance is not FDA approved in patients less than 30 years old. Performed at [facility], [address], [place], [place] [address]   Culture, blood (routine x 2)     Status: None (Preliminary result)   Collection Time: 03/26/24 12:57 AM   Specimen: BLOOD  Result Value Ref Range Status   Specimen Description   Final    BLOOD BLOOD RIGHT ARM Performed at [facility], [address], [place], [place] [address]    Special Requests   Final    Blood Culture results may not be optimal due to an inadequate volume of blood received in culture bottles BOTTLES DRAWN AEROBIC AND ANAEROBIC Performed at [facility], [address], [place], [place] [address]    Culture   Final    NO GROWTH < 12 HOURS Performed at [facility], [address]  [place], [place] [address]    Report Status PENDING  Incomplete     Radiology Studies: CT ABDOMEN PELVIS W CONTRAST Result Date: 03/25/2024 EXAM: CT ABDOMEN AND PELVIS WITH CONTRAST 03/25/2024 09:55:59 PM TECHNIQUE: CT of the abdomen and pelvis was performed with the administration of 100 mL of iohexol  (OMNIPAQUE ) 350 MG/ML injection. Multiplanar reformatted images are provided for review. Automated exposure control, iterative reconstruction, and/or weight-based adjustment of the mA/kV was utilized to reduce the radiation dose to as low as reasonably achievable. COMPARISON: 04/01/2015 CLINICAL HISTORY: Abdominal pain, acute, nonlocalized. FINDINGS: LOWER CHEST: Basilar atelectasis. LIVER: The liver is unremarkable. GALLBLADDER AND BILE DUCTS: Gallbladder is unremarkable. No biliary ductal dilatation. SPLEEN: No acute abnormality. PANCREAS: No acute abnormality. ADRENAL GLANDS: Stable small nodules in the adrenal glands bilaterally, most compatible with adenomas. KIDNEYS, URETERS AND BLADDER: 1 m. m nonobstructing stone in the upper pole of the left kidney. 8 mm nonobstructing stone in the lower pole of the left kidney. No hydronephrosis. The urinary bladder is obscured by beam hardening artifact from the left hip replacement. Small bilateral renal cysts appear benign. Per consensus, no follow-up is needed for simple Bosniak type 1  and 2 renal cysts, unless the patient has a malignancy history or risk factors. No perinephric or periureteral stranding. GI AND BOWEL: Stomach demonstrates no acute abnormality. Sigmoid diverticulosis. Inflammatory stranding around the mid sigmoid colon compatible with active diverticulitis. There is no bowel obstruction. PERITONEUM AND RETROPERITONEUM: No ascites. No free air. VASCULATURE: Aorta is normal in caliber. Aortic atherosclerosis. LYMPH NODES: No lymphadenopathy. REPRODUCTIVE ORGANS: No acute abnormality. BONES AND SOFT TISSUES: Prior left hip replacement. Degenerative changes  in the lumbar spine. No acute bony abnormality. No focal soft tissue abnormality. IMPRESSION: 1. Sigmoid diverticulosis with active diverticulitis in the mid sigmoid colon. 2. Nonobstructing left nephrolithiasis. Electronically signed by: Franky Crease MD 03/25/2024 10:24 PM EST RP Workstation: [id number]   CT Angio Chest PE W and/or Wo Contrast Result Date: 03/25/2024 EXAM: CTA of the Chest with contrast for PE 03/25/2024 09:55:59 PM TECHNIQUE: CTA of the chest was performed after the administration of 100 mL of iohexol  (OMNIPAQUE ) 350 MG/ML injection. Multiplanar reformatted images are provided for review. MIP images are provided for review. Automated exposure control, iterative reconstruction, and/or weight based adjustment of the mA/kV was utilized to reduce the radiation dose to as low as reasonably achievable. COMPARISON: 08/18/2021 CLINICAL HISTORY: Pulmonary embolism (PE) suspected, high prob. FINDINGS: PULMONARY ARTERIES: Pulmonary arteries are adequately opacified for evaluation. No pulmonary embolism. Main pulmonary artery is normal in caliber. MEDIASTINUM: The heart and pericardium demonstrate no acute abnormality. Scattered coronary artery and aortic atherosclerosis. There is no acute abnormality of the thoracic aorta. LYMPH NODES: No mediastinal, hilar or axillary lymphadenopathy. LUNGS AND PLEURA: Areas of atelectasis in the mid and lower lungs. No focal consolidation or pulmonary edema. No pleural effusion or pneumothorax. UPPER ABDOMEN: Limited images of the upper abdomen are unremarkable. SOFT TISSUES AND BONES: No acute bone or soft tissue abnormality. IMPRESSION: 1. No pulmonary embolism. 2. Areas of atelectasis in the mid and lower lungs. No effusions. Electronically signed by: Franky Crease MD 03/25/2024 10:19 PM EST RP Workstation: [id number]    Scheduled Meds:  arformoterol  15 mcg Nebulization BID   atorvastatin  20 mg Oral Daily   budesonide  0.5 mg Nebulization BID   Chlorhexidine  Gluconate Cloth  6 each Topical Daily   insulin  aspart  0-15 Units Subcutaneous TID WC   insulin  aspart  0-5 Units Subcutaneous QHS   losartan   25 mg Oral Daily   methylPREDNISolone  (SOLU-MEDROL ) injection  40 mg Intravenous Q12H   metoprolol  succinate  12.5 mg Oral QHS   [START ON 03/27/2024] potassium chloride  SA  20 mEq Oral Daily   revefenacin  175 mcg Nebulization Daily   Continuous Infusions:  sodium chloride  100 mL/hr at 03/26/24 0300   piperacillin-tazobactam (ZOSYN)  IV       LOS: 1 day   Ivonne Mustache, MD [facility] P11/08/2023, 7:38 AM

## 2024-03-26 NOTE — Evaluation (Signed)
 Physical Therapy Evaluation Patient Details Name: Lisa Ibarra MRN: [id number] DOB: 1960-06-21 Today's Date: 03/26/2024  History of Present Illness  63 yo female returns to ED with weakness, persisitent abdominal pain, N/V. CT A/P shows sigmoid diverticulosis with active diverticulitis. Hospitalized 10/30-11/2/25 with hypoxic respiratory failure. EFY:ubez 2 diabetes, morbid obesity, chronic pain syndrome, HTN, asthma/COPD, tobacco use disorder, chronic diastolic HF, recent thrombocytopenia , LTHA  Clinical Impression  Pt admitted with above diagnosis.  Pt currently with functional limitations due to the deficits listed below (see PT Problem List). Pt will benefit from acute skilled PT to increase their independence and safety with mobility to allow discharge.       The patient is amenable to getting to recliner again. Patient able to stand and step. Patient juste DC'd and back  to hospital. Was able to ambulate x65' with RW at Dc. Lives with daughter.  Patient should progress to return home, recommend HHPT.    If plan is discharge home, recommend the following: A little help with walking and/or transfers;A little help with bathing/dressing/bathroom;Assistance with cooking/housework;Assist for transportation;Help with stairs or ramp for entrance   Can travel by private vehicle        Equipment Recommendations None recommended by PT  Recommendations for Other Services       Functional Status Assessment Patient has had a recent decline in their functional status and demonstrates the ability to make significant improvements in function in a reasonable and predictable amount of time.     Precautions / Restrictions Precautions Precautions: Fall Precaution/Restrictions Comments: monitor O2, HR      Mobility  Bed Mobility   Bed Mobility: Supine to Sit     Supine to sit: Supervision          Transfers Overall transfer level: Needs assistance   Transfers: Sit to/from Stand Sit  to Stand: Contact guard assist Stand pivot transfers: Contact guard assist Step pivot transfers: Contact guard assist       General transfer comment: assist with lines, step pivot to recliner    Ambulation/Gait                  Stairs            Wheelchair Mobility     Tilt Bed    Modified Rankin (Stroke Patients Only)       Balance Overall balance assessment: Mild deficits observed, not formally tested                                           Pertinent Vitals/Pain Pain Assessment Pain Assessment: Faces Faces Pain Scale: Hurts little more Pain Location: abdomen, back Pain Descriptors / Indicators: Discomfort, Sore Pain Intervention(s): Monitored during session, Premedicated before session    Home Living Family/patient expects to be discharged to:: Private residence Living Arrangements: Children Available Help at Discharge: Family Type of Home: Apartment Home Access: Level entry       Home Layout: One level   Additional Comments: just DC'd 11/2    Prior Function Prior Level of Function : Independent/Modified Independent             Mobility Comments: ambulated 53' with Rw at DC using RW and supervision ADLs Comments: She was independent with ADLs, driving, and sharing cooking & cleaning with her daughter. She works from home.     Extremity/Trunk Assessment  Lower Extremity Assessment RLE Deficits / Details: AROM WFL LLE Deficits / Details: AROM WFL    Cervical / Trunk Assessment Cervical / Trunk Assessment: Normal  Communication        Cognition Arousal: Alert Behavior During Therapy: WFL for tasks assessed/performed   PT - Cognitive impairments: No apparent impairments                         Following commands: Intact       Cueing       General Comments      Exercises     Assessment/Plan    PT Assessment Patient needs continued PT services  PT Problem List Decreased  strength;Decreased activity tolerance;Decreased balance;Decreased mobility;Decreased knowledge of use of DME;Pain       PT Treatment Interventions DME instruction;Gait training;Functional mobility training;Therapeutic activities;Therapeutic exercise;Patient/family education;Balance training    PT Goals (Current goals can be found in the Care Plan section)  Acute Rehab PT Goals Patient Stated Goal: home soon. regain PLOF/independence PT Goal Formulation: With patient Time For Goal Achievement: 04/09/24 Potential to Achieve Goals: Good    Frequency Min 2X/week     Co-evaluation               AM-PAC PT 6 Clicks Mobility  Outcome Measure Help needed turning from your back to your side while in a flat bed without using bedrails?: None Help needed moving from lying on your back to sitting on the side of a flat bed without using bedrails?: A Little Help needed moving to and from a bed to a chair (including a wheelchair)?: A Little Help needed standing up from a chair using your arms (e.g., wheelchair or bedside chair)?: A Little Help needed to walk in hospital room?: A Lot Help needed climbing 3-5 steps with a railing? : A Lot 6 Click Score: 17    End of Session   Activity Tolerance: Patient tolerated treatment well;Patient limited by fatigue Patient left: in chair;with call bell/phone within reach Nurse Communication: Mobility status PT Visit Diagnosis: Unsteadiness on feet (R26.81);Difficulty in walking, not elsewhere classified (R26.2)    Time: 8579-8546 PT Time Calculation (min) (ACUTE ONLY): 33 min   Charges:   PT Evaluation $PT Eval Low Complexity: 1 Low PT Treatments $Therapeutic Activity: 8-22 mins PT General Charges $$ ACUTE PT VISIT: 1 Visit         =Lisa Ibarra [facility] PT Acute Rehabilitation Services Office [phone]   Lisa Ibarra, Lisa Ibarra 03/26/2024, 4:13 PM

## 2024-03-26 NOTE — Evaluation (Addendum)
 Occupational Therapy Evaluation Patient Details Name: Lisa Ibarra MRN: [id number] DOB: 05-Jun-1960 Today's Date: 03/26/2024   History of Present Illness   63 yo female returns to ED with weakness, persisitent abdominal pain, N/V. CT A/P shows sigmoid diverticulosis with active diverticulitis. Hospitalized 10/30-11/2/25 with hypoxic respiratory failure. EFY:ubez 2 diabetes, morbid obesity, chronic pain syndrome, HTN, asthma/COPD, tobacco use disorder, chronic diastolic HF, recent thrombocytopenia , LTHA     Clinical Impressions Pt recently discharged from hospital with recommendations for HHOT. At baseline, pt lives independently with her daughter/son-in-law and works from home. Prior to recent hospitalization pt was independent with mobility and ADL/IADL tasks, however has required more assistance lately. Pt currently requires mod A with LB ADL tasks and decreased activity tolerance. VSS during minimal activity on RA - will further assess O2 needs with increased activity. At this time recommend HHOT to maximize functional level of independence and reduce risk of falls. Acute OT to follow.      If plan is discharge home, recommend the following:   A little help with walking and/or transfers;A little help with bathing/dressing/bathroom;Assistance with cooking/housework;Assist for transportation     Functional Status Assessment   Patient has had a recent decline in their functional status and demonstrates the ability to make significant improvements in function in a reasonable and predictable amount of time.     Equipment Recommendations   BSC/3in1     Recommendations for Other Services         Precautions/Restrictions   Precautions Precautions: Fall Precaution/Restrictions Comments: monitor O2, HR     Mobility Bed Mobility               General bed mobility comments: OOB in chair    Transfers     Transfers: Sit to/from Stand Sit to Stand: Contact guard  assist                  Balance Overall balance assessment: Mild deficits observed, not formally tested, History of Falls (fell at home PTA)- unablet o get up with assistance of her son - called for lifting assistance                                         ADL either performed or assessed with clinical judgement   ADL Overall ADL's : Needs assistance/impaired Eating/Feeding: Independent   Grooming: Set up;Sitting   Upper Body Bathing: Set up;Sitting;Supervision/ safety   Lower Body Bathing: Moderate assistance;Sit to/from stand   Upper Body Dressing : Supervision/safety;Set up;Sitting   Lower Body Dressing: Moderate assistance;Sit to/from stand   Toilet Transfer: Stand-pivot;Contact guard assist   Toileting- Clothing Manipulation and Hygiene: Moderate assistance Toileting - Clothing Manipulation Details (indicate cue type and reason): Difficulty reaching; may benefit from a toilet aide  May benefit form AE for LB ADL; more fatigued with activity; says  I was recently diagnosed with COPD   Functional mobility during ADLs: Contact guard assist       Vision  Reading glasses       Perception         Praxis         Pertinent Vitals/Pain Pain Assessment Pain Assessment: Faces Faces Pain Scale: Hurts little more Pain Location: abdomen, back Pain Descriptors / Indicators: Discomfort, Sore Pain Intervention(s): Limited activity within patient's tolerance     Extremity/Trunk Assessment Upper Extremity Assessment Upper Extremity Assessment: Overall WFL for  tasks assessed   Lower Extremity Assessment Lower Extremity Assessment: Defer to PT evaluation RLE Deficits / Details: AROM WFL LLE Deficits / Details: AROM WFL   Cervical / Trunk Assessment Cervical / Trunk Assessment: Other exceptions (increased body habitus)   Communication Communication Communication: No apparent difficulties   Cognition Arousal: Alert Behavior During Therapy:  WFL for tasks assessed/performed Cognition: No apparent impairments                                       Cueing  General Comments       States she recently quit smoking after last admission; expresses concern for stress this has put on her daughter   Exercises     Shoulder Instructions      Home Living Family/patient expects to be discharged to:: Private residence Living Arrangements: Children Available Help at Discharge: Family Type of Home: Apartment Home Access: Level entry     Home Layout: One level     Bathroom Shower/Tub: Producer, Television/film/video: Handicapped height Bathroom Accessibility: Yes How Accessible: Accessible via walker;Accessible via wheelchair Home Equipment: Grab bars - tub/shower;Hand held shower head;Rolling Walker (2 wheels);Shower seat - built in   Additional Comments: just DC'd 11/2      Prior Functioning/Environment Prior Level of Function : Independent/Modified Independent             Mobility Comments: She was independent with ambulation. ADLs Comments: She was independent with ADLs, driving, and sharing cooking & cleaning with her daughter. She works from home  prior to initial admission. Since DC, daughter has been cooking.    OT Problem List: Decreased strength;Decreased activity tolerance;Impaired balance (sitting and/or standing);Decreased safety awareness;Obesity;Cardiopulmonary status limiting activity   OT Treatment/Interventions: Self-care/ADL training;Therapeutic exercise;Energy conservation;DME and/or AE instruction;Therapeutic activities;Patient/family education;Balance training      OT Goals(Current goals can be found in the care plan section)   Acute Rehab OT Goals Patient Stated Goal: feel better and go home OT Goal Formulation: With patient Time For Goal Achievement: 04/09/24 Potential to Achieve Goals: Good   OT Frequency:  Min 2X/week    Co-evaluation              AM-PAC OT  6 Clicks Daily Activity     Outcome Measure Help from another person eating meals?: None Help from another person taking care of personal grooming?: A Little Help from another person toileting, which includes using toliet, bedpan, or urinal?: A Lot Help from another person bathing (including washing, rinsing, drying)?: A Lot Help from another person to put on and taking off regular upper body clothing?: A Little Help from another person to put on and taking off regular lower body clothing?: A Lot 6 Click Score: 16   End of Session Equipment Utilized During Treatment: Rolling walker (2 wheels) Nurse Communication: Mobility status  Activity Tolerance: Patient limited by fatigue (pt states she will domore on next visit) Patient left: in chair;with call bell/phone within reach  OT Visit Diagnosis: Unsteadiness on feet (R26.81);Other abnormalities of gait and mobility (R26.89);Muscle weakness (generalized) (M62.81);History of falling (Z91.81) Pain - part of body:  (abdomen)                Time: 8458-8395 OT Time Calculation (min): 23 min Charges:  OT General Charges $OT Visit: 1 Visit OT Evaluation $OT Eval Moderate Complexity: 1 Mod  Quita Mcgrory, OT/L   Acute OT  Clinical Specialist Acute Rehabilitation Services Pager [phone] Office [phone]   [facility] 03/26/2024, 5:32 PM

## 2024-03-27 DIAGNOSIS — R6520 Severe sepsis without septic shock: Secondary | ICD-10-CM | POA: Diagnosis not present

## 2024-03-27 DIAGNOSIS — J9601 Acute respiratory failure with hypoxia: Secondary | ICD-10-CM | POA: Diagnosis not present

## 2024-03-27 DIAGNOSIS — A419 Sepsis, unspecified organism: Secondary | ICD-10-CM | POA: Diagnosis not present

## 2024-03-27 DIAGNOSIS — K5792 Diverticulitis of intestine, part unspecified, without perforation or abscess without bleeding: Secondary | ICD-10-CM | POA: Diagnosis not present

## 2024-03-27 LAB — BASIC METABOLIC PANEL WITH GFR
Anion gap: 9 (ref 5–15)
BUN: 13 mg/dL (ref 8–23)
CO2: 24 mmol/L (ref 22–32)
Calcium: 8.9 mg/dL (ref 8.9–10.3)
Chloride: 102 mmol/L (ref 98–111)
Creatinine, Ser: 0.71 mg/dL (ref 0.44–1.00)
GFR, Estimated: >60 mL/min (ref >60)
Glucose, Bld: 191 mg/dL — ABNORMAL HIGH (ref 70–99)
Potassium: 4.3 mmol/L (ref 3.5–5.1)
Sodium: 136 mmol/L (ref 135–145)

## 2024-03-27 LAB — BLOOD GAS, ARTERIAL
Acid-base deficit: 1.7 mmol/L (ref 0.0–2.0)
Bicarbonate: 24.3 mmol/L (ref 20.0–28.0)
Drawn by: 331471
O2 Content: 4 L/min
O2 Saturation: 93.3 %
Patient temperature: 37
pCO2 arterial: 45 mmHg (ref 32–48)
pH, Arterial: 7.34 — ABNORMAL LOW (ref 7.35–7.45)
pO2, Arterial: 64 mmHg — ABNORMAL LOW (ref 83–108)

## 2024-03-27 LAB — CBC
HCT: 48.8 % — ABNORMAL HIGH (ref 36.0–46.0)
Hemoglobin: 15.7 g/dL — ABNORMAL HIGH (ref 12.0–15.0)
MCH: 31.8 pg (ref 26.0–34.0)
MCHC: 32.2 g/dL (ref 30.0–36.0)
MCV: 99 fL (ref 80.0–100.0)
Platelets: 64 K/uL — ABNORMAL LOW (ref 150–400)
RBC: 4.93 MIL/uL (ref 3.87–5.11)
RDW: 13.4 % (ref 11.5–15.5)
WBC: 8.9 K/uL (ref 4.0–10.5)
nRBC: 0 % (ref 0.0–0.2)

## 2024-03-27 LAB — GLUCOSE, CAPILLARY
Glucose-Capillary: 199 mg/dL — ABNORMAL HIGH (ref 70–99)
Glucose-Capillary: 344 mg/dL — ABNORMAL HIGH (ref 70–99)
Glucose-Capillary: 344 mg/dL — ABNORMAL HIGH (ref 70–99)
Glucose-Capillary: 284 mg/dL — ABNORMAL HIGH (ref 70–99)

## 2024-03-27 LAB — CULTURE, BLOOD (ROUTINE X 2): Culture  Setup Time: NO GROWTH

## 2024-03-27 MED ORDER — POLYETHYLENE GLYCOL 3350 17 G PO PACK
17.0000 g | PACK | Freq: Every day | ORAL | Status: DC
  Administered 2024-03-27 – 2024-03-28 (×2): 17 g via ORAL
  Filled 2024-03-27 (×2): qty 1

## 2024-03-27 MED ORDER — HYDROMORPHONE HCL 1 MG/ML IJ SOLN
1.0000 mg | INTRAMUSCULAR | Status: DC | PRN
  Administered 2024-03-27 – 2024-03-28 (×6): 1 mg via INTRAVENOUS
  Filled 2024-03-27 (×6): qty 1

## 2024-03-27 NOTE — Progress Notes (Addendum)
 Progress Note    Lisa Ibarra   FMW:[id number]  DOB: [birth year]  DOA: 03/25/2024     2 PCP: Cesario Mutton, MD  Initial CC: abd pain  Hospital Course: [name] is a 63 year old female with history of diabetes type 2, morbid obesity, chronic pain syndrome, hypertension, asthma/COPD, tobacco use disorder, chronic diastolic CHF, thrombocytopenia who presented for evaluation of generalized weakness, abdomen pain, nausea, vomiting.   She was recently hospitalized 10/30 - 11/2 for treatment for acute hypoxic respiratory failure secondary to COPD/exacerbation.  Blood cultures sent during that hospitalization had shown E. coli, Pseudomonas.   Also reported subjective fever at home.  On presentation, she was in sinus tachycardia, mildly hypertensive, requiring 4 L of oxygen per minute.  Lab work showed potassium of 3.3, platelet of 48, lactic acid of 2.   CTA PE study negative for PE but showed atelectasis in the mid and lower lungs.   CT abdomen/pelvis showed sigmoid diverticulosis with active diverticulitis in the mid sigmoid colon.  Started on IV fluids, broad-spectrum antibiotics,culture sent.     Assessment & Plan:   Severe sepsis secondary to acute diverticulitis - Subjective fever, tachypnea, leukocytosis, elevated lactic.  CT showing diverticulitis - S/p IV fluids on admission due to hypotension - Overall clinically improved - Now tolerating diet with no nausea/vomiting and no diarrhea - Informed her clinically she is improving and approaching discharge - Also informed her clinically she should not be needing increased doses of pain medications if she is tolerating diet without active vomiting - Planning to decrease pain regimen tomorrow and pursue discharge; upon doing so, she immediately asked what criteria would warrant keeping her in the hospital  Recent bacteremia - Prior blood cultures from 03/21/2024 grew Pseudomonas, Klebsiella, E. Coli -Repeat cultures on 03/25/2024 remain  negative - Currently on Zosyn, continue in setting of diverticulitis; she was recently continued on Keflex at discharge to complete prior course  Acute hypoxic respiratory failure - resolved  COPD Asthma exacerbation - Not on oxygen at home.  Hypoxic on presentation.  Also reported productive cough, low-grade fever.  CTA did not show any evidence of pneumonia but showed atelectasis.  This could be likely from COPD exacerbation .  Continue bronchodilators, incentive spirometer.  Weaned to room air  - no further wheezing appreciated today.  Discontinue steroids in setting of underlying infection -Continue nebulizers   Hypokalemia - Replete as needed   Chronic diastolic CHF - Appears euvolemic on presentation.  proBNP slightly elevated.  On gentle IV fluids   Chronic thrombocytopenia - Developed thrombocytopenia on last hospitalization.  CBC showed normal smear, DIC ruled out.  Liver imaging unremarkable.  Likely from sepsis.  Continue to monitor   Type 2 diabetes - Recent A1c of 7.  Continue sliding scale.  Monitor blood sugars   Hypertension - Hypotensive in presentation.  Currently blood pressure stable.  On losartan , Toprol  at home   Hyperlipidemia - Continue Lipitor   Tobacco use disorder: Counseled cessation.   Generalized weakness: PT consulted   Morbid obesity: BMI 42.3  Interval History:  Resting comfortably in recliner this morning.  Denies any abdominal pain nor wheezing/cough. Ambulating and tolerating oral diet.  No reports of diarrhea.  Antimicrobials: Cefepime 11/3 x1 Flagyl 11/4 Zosyn 11/4 >> current   DVT prophylaxis:  SCDs Start: 03/25/24 2341   Code Status:   Code Status: Full Code  Mobility Assessment (Last 72 Hours)     Mobility Assessment     Row Name 03/27/24 0800  03/26/24 1959 03/26/24 1610 03/26/24 0800 03/26/24 0159   Does the patient have exclusion criteria? No - Perform mobility assessment No - Perform mobility assessment -- No - Perform  mobility assessment No - Perform mobility assessment   What is the highest level of mobility based on the mobility assessment? Level 4 (Ambulates with assistance) - Balance while stepping forward/back - Complete Level 4 (Ambulates with assistance) - Balance while stepping forward/back - Complete Level 4 (Ambulates with assistance) - Balance while stepping forward/back - Complete Level 3 (Stands with assistance) - Balance while standing  and cannot march in place Level 3 (Stands with assistance) - Balance while standing  and cannot march in place   Is the above level different from baseline mobility prior to current illness? Yes - Recommend PT order Yes - Recommend PT order -- Yes - Recommend PT order Yes - Recommend PT order      Diet: Diet Orders (From admission, onward)     Start     Ordered   03/25/24 2341  Diet heart healthy/carb modified Room service appropriate? Yes; Fluid consistency: Thin  Diet effective now       Question Answer Comment  Diet-HS Snack? Nothing   Room service appropriate? Yes   Fluid consistency: Thin      03/25/24 2340            Barriers to discharge: none Disposition Plan:  Home HH orders placed: HHPT/OT Status is: Inpt  Objective: Blood pressure (!) 164/71, pulse 64, temperature 98.6 F (37 C), temperature source Oral, resp. rate 20, height 5' 9 (1.753 m), weight 130.1 kg, last menstrual period 06/12/2011, SpO2 96%.  Examination:  Physical Exam Constitutional:      Appearance: Normal appearance. She is obese.  HENT:     Head: Normocephalic and atraumatic.     Mouth/Throat:     Mouth: Mucous membranes are moist.  Eyes:     Extraocular Movements: Extraocular movements intact.  Cardiovascular:     Rate and Rhythm: Normal rate and regular rhythm.  Pulmonary:     Effort: Pulmonary effort is normal. No respiratory distress.     Breath sounds: Normal breath sounds. No wheezing.  Abdominal:     General: Bowel sounds are normal. There is no  distension.     Palpations: Abdomen is soft.     Tenderness: There is no abdominal tenderness.  Musculoskeletal:        General: Normal range of motion.     Cervical back: Normal range of motion and neck supple.  Skin:    General: Skin is warm and dry.  Neurological:     General: No focal deficit present.     Mental Status: She is alert.  Psychiatric:        Mood and Affect: Mood normal.      Consultants:    Procedures:    Data Reviewed: Results for orders placed or performed during the hospital encounter of 03/25/24 (from the past 24 hours)  Glucose, capillary     Status: Abnormal   Collection Time: 03/26/24  4:32 PM  Result Value Ref Range   Glucose-Capillary 207 (H) 70 - 99 mg/dL  Glucose, capillary     Status: Abnormal   Collection Time: 03/26/24  8:29 PM  Result Value Ref Range   Glucose-Capillary 282 (H) 70 - 99 mg/dL  CBC     Status: Abnormal   Collection Time: 03/27/24  5:40 AM  Result Value Ref Range   WBC 8.9  4.0 - 10.5 K/uL   RBC 4.93 3.87 - 5.11 MIL/uL   Hemoglobin 15.7 (H) 12.0 - 15.0 g/dL   HCT 51.1 (H) 63.9 - 53.9 %   MCV 99.0 80.0 - 100.0 fL   MCH 31.8 26.0 - 34.0 pg   MCHC 32.2 30.0 - 36.0 g/dL   RDW 86.5 88.4 - 84.4 %   Platelets 64 (L) 150 - 400 K/uL   nRBC 0.0 0.0 - 0.2 %  Basic metabolic panel with GFR     Status: Abnormal   Collection Time: 03/27/24  5:40 AM  Result Value Ref Range   Sodium 136 135 - 145 mmol/L   Potassium 4.3 3.5 - 5.1 mmol/L   Chloride 102 98 - 111 mmol/L   CO2 24 22 - 32 mmol/L   Glucose, Bld 191 (H) 70 - 99 mg/dL   BUN 13 8 - 23 mg/dL   Creatinine, Ser 9.28 0.44 - 1.00 mg/dL   Calcium 8.9 8.9 - 89.6 mg/dL   GFR, Estimated >39 >39 mL/min   Anion gap 9 5 - 15  Glucose, capillary     Status: Abnormal   Collection Time: 03/27/24  7:44 AM  Result Value Ref Range   Glucose-Capillary 199 (H) 70 - 99 mg/dL  Glucose, capillary     Status: Abnormal   Collection Time: 03/27/24 11:17 AM  Result Value Ref Range    Glucose-Capillary 344 (H) 70 - 99 mg/dL    I have reviewed pertinent nursing notes, vitals, labs, and images as necessary. I have ordered labwork to follow up on as indicated.  I have reviewed the last notes from staff over past 24 hours. I have discussed patient's care plan and test results with nursing staff, CM/SW, and other staff as appropriate.  Old records reviewed in assessment of this patient  Time spent: Greater than 50% of the 55 minute visit was spent in counseling/coordination of care for the patient as laid out in the A&P.   LOS: 2 days   Alm Apo, MD [facility] 03/27/2024, 3:15 PM

## 2024-03-27 NOTE — TOC Initial Note (Signed)
 Transition of Care [facility]) - Initial/Assessment Note   Patient Details  Name: Lisa Ibarra MRN: [id number] Date of Birth: 06-Feb-1961  Transition of Care [facility]) CM/SW Contact:    Duwaine GORMAN Aran, LCSW Phone Number: 03/27/2024, 12:06 PM  Clinical Narrative: Patient was recently discharged 03/24/24 and was set up with [facility] for HHPT/OT at that time. Resumption HH orders placed by hospitalist. CSW updated [facility] with [facility]. [facility] to follow patient throughout admission.  Expected Discharge Plan: Home w Home Health Services Barriers to Discharge: Continued Medical Work up  Patient Goals and CMS Choice CMS Medicare.gov Compare Post Acute Care list provided to:: Patient Choice offered to / list presented to : Patient  Expected Discharge Plan and Services In-house Referral: Clinical Social Work Post Acute Care Choice: Home Health Living arrangements for the past 2 months: Apartment           DME Arranged: N/A DME Agency: NA HH Arranged: PT, OT HH Agency: [facility] Date HH Agency Contacted: 03/27/24 Time HH Agency Contacted: 1155 Representative spoke with at [facility] Agency: [place]  Prior Living Arrangements/Services Living arrangements for the past 2 months: Apartment Lives with:: Adult Children Patient language and need for interpreter reviewed:: Yes Do you feel safe going back to the place where you live?: Yes      Need for Family Participation in Patient Care: No (Comment) Care giver support system in place?: Yes (comment) Criminal Activity/Legal Involvement Pertinent to Current Situation/Hospitalization: No - Comment as needed  Activities of Daily Living ADL Screening (condition at time of admission) Independently performs ADLs?: Yes (appropriate for developmental age) Is the patient deaf or have difficulty hearing?: No Does the patient have difficulty seeing, even when wearing glasses/contacts?: No Does the patient have difficulty concentrating, remembering, or  making decisions?: No  Emotional Assessment Orientation: : Oriented to Self, Oriented to Place, Oriented to  Time, Oriented to Situation Alcohol / Substance Use: Not Applicable Psych Involvement: No (comment)  Admission diagnosis:  Diverticulitis [K57.92] Sepsis (HCC) [A41.9] Sepsis with acute hypoxic respiratory failure without septic shock, due to unspecified organism (HCC) [A41.9, R65.20, J96.01] Patient Active Problem List   Diagnosis Date Noted   Acute diverticulitis 03/26/2024   Generalized weakness 03/26/2024   Obesity, Class III, BMI 40-49.9 (morbid obesity) (HCC) 03/26/2024   Chronic diastolic CHF (congestive heart failure) (HCC) 03/26/2024   Asthma with COPD with exacerbation (HCC) 03/26/2024   Sepsis (HCC) 03/25/2024   COPD with acute exacerbation (HCC) 03/23/2024   Acute exacerbation of CHF (congestive heart failure) (HCC) 03/21/2024   COVID-19 08/18/2021   Hypokalemia 08/18/2021   Thrombocytopenia 08/18/2021   SIRS (systemic inflammatory response syndrome) (HCC) 08/18/2021   Acute hypoxic respiratory failure (HCC) 08/18/2021   Uncontrolled diabetes mellitus 05/29/2012   Influenza A H1N1 infection 05/28/2012   Acute bronchitis 05/27/2012   Asthma exacerbation 05/26/2012   Diabetes mellitus (HCC) 05/26/2012   Diarrhea 05/26/2012   Essential hypertension 01/05/2007   Headache 01/05/2007   CERVICAL CANCER, HX OF 01/05/2007   PCP:  Cesario Mutton, MD Pharmacy:   [facility] [address] [place], [place] - [address] [place] [address] Phone: [phone] Fax: [phone]  Social Drivers of Health (SDOH) Social History: SDOH Screenings   Food Insecurity: No Food Insecurity (03/26/2024)  Housing: Low Risk  (03/26/2024)  Transportation Needs: Patient Unable To Answer (03/26/2024)  Utilities: Patient Unable To Answer (03/26/2024)  Financial Resource Strain: Low Risk  (10/27/2023)   Received from [facility]  Physical Activity:  Unknown (10/27/2023)   Received from [facility]  Social Connections: Moderately Integrated (10/27/2023)   Received from [facility]  Stress: Stress Concern Present (10/27/2023)   Received from [facility]  Tobacco Use: High Risk (03/25/2024)   SDOH Interventions:    Readmission Risk Interventions    03/24/2024   12:01 PM 03/22/2024    1:37 PM  Readmission Risk Prevention Plan  Post Dischage Appt Complete   Medication Screening Complete   Transportation Screening Complete Complete  PCP or Specialist Appt within 5-7 Days  Complete  Home Care Screening  Complete  Medication Review (RN CM)  Complete

## 2024-03-27 NOTE — Progress Notes (Signed)
 Mobility Specialist - Progress Note   03/27/24 1421  Mobility  Activity Ambulated with assistance  Level of Assistance Contact guard assist, steadying assist  Assistive Device Front wheel walker  Distance Ambulated (ft) 230 ft  Range of Motion/Exercises Active  Activity Response Tolerated well  Mobility Referral Yes  Mobility visit 1 Mobility  Mobility Specialist Start Time (ACUTE ONLY) 1357  Mobility Specialist Stop Time (ACUTE ONLY) 1412  Mobility Specialist Time Calculation (min) (ACUTE ONLY) 15 min   Received in chair and agreed to mobility, upon ambulating in hallway, pt had one bout of unsteadiness when waving at staff, no other issues throughout session and returned to chair with all needs met.  Cyndee Ada Mobility Specialist

## 2024-03-27 NOTE — Hospital Course (Addendum)
 Lisa Ibarra is a 63 year old female with history of diabetes type 2, morbid obesity, chronic pain syndrome, hypertension, asthma/COPD, tobacco use disorder, chronic diastolic CHF, thrombocytopenia who presented for evaluation of generalized weakness, abdomen pain, nausea, vomiting.   She was recently hospitalized 10/30 - 11/2 for treatment for acute hypoxic respiratory failure secondary to COPD/exacerbation.  Blood cultures sent during that hospitalization had shown E. coli, Pseudomonas.   Also reported subjective fever at home.  On presentation, she was in sinus tachycardia, mildly hypertensive, requiring 4 L of oxygen per minute.  Lab work showed potassium of 3.3, platelet of 48, lactic acid of 2.   CTA PE study negative for PE but showed atelectasis in the mid and lower lungs.   CT abdomen/pelvis showed sigmoid diverticulosis with active diverticulitis in the mid sigmoid colon.  Started on IV fluids, broad-spectrum antibiotics,culture sent.     Assessment & Plan:   Severe sepsis secondary to acute diverticulitis - Subjective fever, tachypnea, leukocytosis, elevated lactic.  CT showing diverticulitis - S/p IV fluids on admission due to hypotension - Overall clinically improved - Now tolerating diet with no nausea/vomiting and no diarrhea - Informed her clinically she is improving and approaching discharge - Also informed her clinically she should not be needing increased doses of pain medications if she is tolerating diet without active vomiting - Planning to decrease pain regimen tomorrow and pursue discharge; upon doing so, she immediately asked what criteria would warrant keeping her in the hospital  Recent bacteremia - Prior blood cultures from 03/21/2024 grew Pseudomonas, Klebsiella, E. Coli -Repeat cultures on 03/25/2024 remain negative - Currently on Zosyn, continue in setting of diverticulitis; she was recently continued on Keflex at discharge to complete prior course  Acute hypoxic  respiratory failure - resolved  COPD Asthma exacerbation - Not on oxygen at home.  Hypoxic on presentation.  Also reported productive cough, low-grade fever.  CTA did not show any evidence of pneumonia but showed atelectasis.  This could be likely from COPD exacerbation .  Continue bronchodilators, incentive spirometer.  Weaned to room air  - no further wheezing appreciated today.  Discontinue steroids in setting of underlying infection -Continue nebulizers   Hypokalemia - Replete as needed   Chronic diastolic CHF - Appears euvolemic on presentation.  proBNP slightly elevated.  On gentle IV fluids   Chronic thrombocytopenia - Developed thrombocytopenia on last hospitalization.  CBC showed normal smear, DIC ruled out.  Liver imaging unremarkable.  Likely from sepsis.  Continue to monitor   Type 2 diabetes - Recent A1c of 7.  Continue sliding scale.  Monitor blood sugars   Hypertension - Hypotensive in presentation.  Currently blood pressure stable.  On losartan , Toprol  at home   Hyperlipidemia - Continue Lipitor   Tobacco use disorder: Counseled cessation.   Generalized weakness: PT consulted   Morbid obesity: BMI 42.3

## 2024-03-27 NOTE — Plan of Care (Signed)
  Problem: Education: Goal: Knowledge of General Education information will improve Description: Including pain rating scale, medication(s)/side effects and non-pharmacologic comfort measures Outcome: Progressing   Problem: Health Behavior/Discharge Planning: Goal: Ability to manage health-related needs will improve Outcome: Progressing   Problem: Clinical Measurements: Goal: Ability to maintain clinical measurements within normal limits will improve Outcome: Progressing Goal: Will remain free from infection Outcome: Progressing Goal: Diagnostic test results will improve Outcome: Progressing Goal: Respiratory complications will improve Outcome: Progressing   Problem: Activity: Goal: Risk for activity intolerance will decrease Outcome: Progressing   Problem: Metabolic: Goal: Ability to maintain appropriate glucose levels will improve Outcome: Progressing

## 2024-03-27 NOTE — Plan of Care (Signed)

## 2024-03-28 DIAGNOSIS — K5792 Diverticulitis of intestine, part unspecified, without perforation or abscess without bleeding: Secondary | ICD-10-CM | POA: Diagnosis not present

## 2024-03-28 DIAGNOSIS — A419 Sepsis, unspecified organism: Secondary | ICD-10-CM | POA: Diagnosis not present

## 2024-03-28 DIAGNOSIS — J9601 Acute respiratory failure with hypoxia: Secondary | ICD-10-CM | POA: Diagnosis not present

## 2024-03-28 LAB — GLUCOSE, CAPILLARY
Glucose-Capillary: 217 mg/dL — ABNORMAL HIGH (ref 70–99)
Glucose-Capillary: 296 mg/dL — ABNORMAL HIGH (ref 70–99)
Glucose-Capillary: 272 mg/dL — ABNORMAL HIGH (ref 70–99)
Glucose-Capillary: 361 mg/dL — ABNORMAL HIGH (ref 70–99)

## 2024-03-28 MED ORDER — OXYCODONE HCL 5 MG PO TABS
5.0000 mg | ORAL_TABLET | ORAL | Status: DC | PRN
  Administered 2024-03-28 – 2024-03-29 (×4): 5 mg via ORAL
  Filled 2024-03-28 (×4): qty 1

## 2024-03-28 MED ORDER — METOPROLOL SUCCINATE ER 25 MG PO TB24
12.5000 mg | ORAL_TABLET | Freq: Every day | ORAL | Status: DC
  Administered 2024-03-28: 12.5 mg via ORAL
  Filled 2024-03-28: qty 1

## 2024-03-28 MED ORDER — HYDROMORPHONE HCL 1 MG/ML IJ SOLN
1.0000 mg | Freq: Once | INTRAMUSCULAR | Status: AC
  Administered 2024-03-28: 1 mg via INTRAVENOUS
  Filled 2024-03-28: qty 1

## 2024-03-28 MED ORDER — HYDRALAZINE HCL 25 MG PO TABS: 25.0000 mg | ORAL_TABLET | ORAL | Status: DC | PRN

## 2024-03-28 MED ORDER — LACTULOSE 10 GM/15ML PO SOLN: 30.0000 g | Freq: Two times a day (BID) | ORAL | Status: DC | PRN

## 2024-03-28 MED ORDER — LOSARTAN POTASSIUM 50 MG PO TABS
25.0000 mg | ORAL_TABLET | Freq: Every day | ORAL | Status: DC
Start: 2024-03-28 — End: 2024-03-29
  Administered 2024-03-28 – 2024-03-29 (×2): 25 mg via ORAL
  Filled 2024-03-28 (×2): qty 1

## 2024-03-28 MED ORDER — LABETALOL HCL 5 MG/ML IV SOLN
10.0000 mg | INTRAVENOUS | Status: DC | PRN
  Administered 2024-03-28: 10 mg via INTRAVENOUS
  Filled 2024-03-28: qty 4

## 2024-03-28 NOTE — Progress Notes (Signed)
 I attest to student documentation.  Brek Reece V. Tashaun Obey, MSN-RN Nursing Faculty/Clinical Instructor [facility] Associate Degree Nursing Program

## 2024-03-28 NOTE — Progress Notes (Signed)
 Occupational Therapy Treatment Patient Details Name: Lisa Ibarra MRN: [id number] DOB: 04/27/1961 Today's Date: 03/28/2024   History of present illness 63 yo female returns to ED with weakness, persisitent abdominal pain, N/V. CT A/P shows sigmoid diverticulosis with active diverticulitis. Hospitalized 10/30-11/2/25 with hypoxic respiratory failure. EFY:ubez 2 diabetes, morbid obesity, chronic pain syndrome, HTN, asthma/COPD, tobacco use disorder, chronic diastolic HF, recent thrombocytopenia , LTHA   OT comments  Session focused on AE education to maximize LB ADL independence. Pt reports familiarity with many AE from prior hip surgery (has reacher, long handled sponge at home). Pt did report some difficulty managing toileting tasks at home. Educated on use of toileting tongs, techniques and other compensatory strategies to consider. Issued toileting tongs for pt to take home to trial. Pt politely deferred ADL retraining during session today and reported plan for hallway mobility later. Continue to recommend HHOT.      If plan is discharge home, recommend the following:  A little help with walking and/or transfers;A little help with bathing/dressing/bathroom;Assistance with cooking/housework;Assist for transportation   Equipment Recommendations  BSC/3in1    Recommendations for Other Services      Precautions / Restrictions Precautions Precautions: Fall Precaution/Restrictions Comments: monitor O2, HR Restrictions Weight Bearing Restrictions Per Provider Order: No       Mobility Bed Mobility               General bed mobility comments: in recliner on entry    Transfers                   General transfer comment: politely declined ADL/mobility retraining at this time. Reports plan to walk in hallway later     Balance                                           ADL either performed or assessed with clinical judgement   ADL Overall ADL's : Needs  assistance/impaired                                       General ADL Comments: Brought AE bag for ADL education w/ pt reporting familiarity with use of reacher, sock aide and long handled sponge from prior hip surgery so able to manage these tasks if needed. Pt reports having a reacher (but may need a new one) and a long handled sponge at home. Educated on toilet tongs, techniques and how to use with pt reporting this will be helpful at home - issued this AE to pt. Also educated on bidet attachments, wet wipes, other strategies to make these easier. Initiated energy conservation education w/ pt denying concerns with energy conservations, reports able to manage routine well without excessive fatigue    Extremity/Trunk Assessment Upper Extremity Assessment Upper Extremity Assessment: Overall WFL for tasks assessed;Right hand dominant   Lower Extremity Assessment Lower Extremity Assessment: Defer to PT evaluation        Vision   Vision Assessment?: No apparent visual deficits   Perception     Praxis     Communication Communication Communication: No apparent difficulties   Cognition Arousal: Alert Behavior During Therapy: WFL for tasks assessed/performed Cognition: No apparent impairments  Following commands: Intact        Cueing   Cueing Techniques: Verbal cues  Exercises      Shoulder Instructions       General Comments      Pertinent Vitals/ Pain       Pain Assessment Pain Assessment: No/denies pain  Home Living                                          Prior Functioning/Environment              Frequency  Min 2X/week        Progress Toward Goals  OT Goals(current goals can now be found in the care plan section)  Progress towards OT goals: Progressing toward goals  Acute Rehab OT Goals Patient Stated Goal: home tomorrow when family will be there OT Goal Formulation: With  patient Time For Goal Achievement: 04/09/24 Potential to Achieve Goals: Good ADL Goals Pt Will Perform Grooming: with set-up;standing Pt Will Perform Lower Body Bathing: with supervision;with set-up;sit to/from stand;with adaptive equipment Pt Will Perform Lower Body Dressing: with supervision;with set-up;sit to/from stand;with adaptive equipment Pt Will Transfer to Toilet: with modified independence;ambulating Pt Will Perform Toileting - Clothing Manipulation and hygiene: with modified independence;sit to/from stand;with adaptive equipment Additional ADL Goal #1: Pt will independently verbalize 3 strategies to reduce risk of falls Additional ADL Goal #2: Pt will indepednently verbalize use of 3 energy conseration techniques to increase independence with ADL and mobility  Plan      Co-evaluation                 AM-PAC OT 6 Clicks Daily Activity     Outcome Measure   Help from another person eating meals?: None Help from another person taking care of personal grooming?: A Little Help from another person toileting, which includes using toliet, bedpan, or urinal?: A Lot Help from another person bathing (including washing, rinsing, drying)?: A Little Help from another person to put on and taking off regular upper body clothing?: A Little Help from another person to put on and taking off regular lower body clothing?: A Lot 6 Click Score: 17    End of Session    OT Visit Diagnosis: Unsteadiness on feet (R26.81);Other abnormalities of gait and mobility (R26.89);Muscle weakness (generalized) (M62.81);History of falling (Z91.81)   Activity Tolerance Patient tolerated treatment well   Patient Left in chair;with call bell/phone within reach;with nursing/sitter in room   Nurse Communication          Time: [phone] OT Time Calculation (min): 19 min  Charges: OT General Charges $OT Visit: 1 Visit OT Treatments $Self Care/Home Management : 8-22 mins  Mliss NOVAK, OTR/L Acute  Rehab Services Office: [phone]   Mliss Fish 03/28/2024, 9:33 AM

## 2024-03-28 NOTE — Progress Notes (Addendum)
 Physical Therapy Treatment Patient Details Name: Lisa Ibarra MRN: [id number] DOB: 1960-06-06 Today's Date: 03/28/2024   History of Present Illness 63 yo female returns to ED with weakness, persisitent abdominal pain, N/V. CT A/P shows sigmoid diverticulosis with active diverticulitis. Hospitalized 10/30-11/2/25 with hypoxic respiratory failure. EFY:ubez 2 diabetes, morbid obesity, chronic pain syndrome, HTN, asthma/COPD, tobacco use disorder, chronic diastolic HF, recent thrombocytopenia , LTHA    PT Comments  Pt reports she's tired from have gas pain during the night, she expressed frustration over being constipated. Assisted pt with ambulating to the bathroom where she had a very small bowel movement. Pt then ambulated in the hall with a RW ~110', distance limited by therapist 2* pt's HR up to 137 with walking.    If plan is discharge home, recommend the following: A little help with walking and/or transfers;A little help with bathing/dressing/bathroom;Assistance with cooking/housework;Assist for transportation;Help with stairs or ramp for entrance   Can travel by private vehicle        Equipment Recommendations  None recommended by PT    Recommendations for Other Services       Precautions / Restrictions Precautions Precautions: Fall Precaution/Restrictions Comments: monitor O2, HR Restrictions Weight Bearing Restrictions Per Provider Order: No     Mobility  Bed Mobility               General bed mobility comments: in recliner on entry    Transfers Overall transfer level: Needs assistance Equipment used: Rolling walker (2 wheels) Transfers: Sit to/from Stand Sit to Stand: Supervision           General transfer comment: VCs hand placement    Ambulation/Gait Ambulation/Gait assistance: Contact guard assist, Supervision Gait Distance (Feet): 110 Feet Assistive device: Rolling walker (2 wheels) Gait Pattern/deviations: Step-through pattern, Decreased stride  length, Trunk flexed Gait velocity: decr     General Gait Details: distance limited by therapist 2* HR 137 with walking, no loss of balance   Stairs             Wheelchair Mobility     Tilt Bed    Modified Rankin (Stroke Patients Only)       Balance Overall balance assessment: Mild deficits observed, not formally tested                                          Communication Communication Communication: No apparent difficulties  Cognition Arousal: Alert Behavior During Therapy: WFL for tasks assessed/performed                             Following commands: Intact      Cueing Cueing Techniques: Verbal cues  Exercises      General Comments        Pertinent Vitals/Pain Pain Assessment Pain Score: 0-No pain    Home Living                          Prior Function            PT Goals (current goals can now be found in the care plan section) Acute Rehab PT Goals Patient Stated Goal: home soon. regain PLOF/independence PT Goal Formulation: With patient Time For Goal Achievement: 04/09/24 Potential to Achieve Goals: Good Progress towards PT goals: Progressing toward goals    Frequency  Min 3X/week      PT Plan      Co-evaluation              AM-PAC PT 6 Clicks Mobility   Outcome Measure  Help needed turning from your back to your side while in a flat bed without using bedrails?: None Help needed moving from lying on your back to sitting on the side of a flat bed without using bedrails?: A Little Help needed moving to and from a bed to a chair (including a wheelchair)?: A Little Help needed standing up from a chair using your arms (e.g., wheelchair or bedside chair)?: A Little Help needed to walk in hospital room?: A Little Help needed climbing 3-5 steps with a railing? : A Little 6 Click Score: 19    End of Session Equipment Utilized During Treatment: Gait belt Activity Tolerance:  Treatment limited secondary to medical complications (Comment) (tachycardia with walking) Patient left: in chair;with call bell/phone within reach Nurse Communication: Mobility status PT Visit Diagnosis: Unsteadiness on feet (R26.81);Difficulty in walking, not elsewhere classified (R26.2)     Time: 8652-8591 PT Time Calculation (min) (ACUTE ONLY): 21 min  Charges:    $Gait Training: 8-22 mins  PT General Charges $$ ACUTE PT VISIT: 1 Visit                    Sylvan Delon Copp PT 03/28/2024  Acute Rehabilitation Services  Office [phone]

## 2024-03-28 NOTE — Progress Notes (Signed)
   03/28/24 1502  Assess: MEWS Score  Temp (!) 100.5 F (38.1 C)  BP (!) 173/102  MAP (mmHg) 126  Pulse Rate (!) 103  Resp (!) 22  Assess: MEWS Score  MEWS Temp 1  MEWS Systolic 0  MEWS Pulse 1  MEWS RR 1  MEWS LOC 0  MEWS Score 3  MEWS Score Color Yellow  Assess: if the MEWS score is Yellow or Red  Were vital signs accurate and taken at a resting state? Yes  Does the patient meet 2 or more of the SIRS criteria? Yes  Does the patient have a confirmed or suspected source of infection? Yes  MEWS guidelines implemented  Yes, yellow  Treat  MEWS Interventions Considered administering scheduled or prn medications/treatments as ordered  Take Vital Signs  Increase Vital Sign Frequency  Yellow: Q2hr x1, continue Q4hrs until patient remains green for 12hrs  Escalate  MEWS: Escalate Yellow: Discuss with charge nurse and consider notifying provider and/or RRT  Provider Notification  Provider Name/Title Dr. Patsy  Date Provider Notified 03/28/24  Time Provider Notified 1459  Method of Notification Page (secure chat)  Notification Reason Change in status  Provider response See new orders  Date of Provider Response 03/28/24  Time of Provider Response 1501  Assess: SIRS CRITERIA  SIRS Temperature  0  SIRS Respirations  1  SIRS Pulse 1  SIRS WBC 0  SIRS Score Sum  2

## 2024-03-28 NOTE — Progress Notes (Signed)
 Progress Note    Lisa Ibarra   FMW:[id number]  DOB: [birth year]  DOA: 03/25/2024     3 PCP: Cesario Mutton, MD  Initial CC: abd pain  Hospital Course: Ms. Lisa Ibarra is a 63 year old female with history of diabetes type 2, morbid obesity, chronic pain syndrome, hypertension, asthma/COPD, tobacco use disorder, chronic diastolic CHF, thrombocytopenia who presented for evaluation of generalized weakness, abdomen pain, nausea, vomiting.   She was recently hospitalized 10/30 - 11/2 for treatment for acute hypoxic respiratory failure secondary to COPD/exacerbation.  Blood cultures sent during that hospitalization had shown E. coli, Pseudomonas.   Also reported subjective fever at home.  On presentation, she was in sinus tachycardia, mildly hypertensive, requiring 4 L of oxygen per minute.  Lab work showed potassium of 3.3, platelet of 48, lactic acid of 2.   CTA PE study negative for PE but showed atelectasis in the mid and lower lungs.   CT abdomen/pelvis showed sigmoid diverticulosis with active diverticulitis in the mid sigmoid colon.  Started on IV fluids, broad-spectrum antibiotics,culture sent.     Assessment & Plan:   Severe sepsis secondary to acute diverticulitis - Subjective fever, tachypnea, leukocytosis, elevated lactic.  CT showing diverticulitis - S/p IV fluids on admission due to hypotension - Overall clinically improved - Now tolerating diet with no nausea/vomiting and no diarrhea - Informed her clinically she is improving and approaching discharge - Also informed her clinically she should not be needing increased doses of pain medications if she is tolerating diet without active vomiting - Change pain regimen to oral; plan is d/c Friday  Recent bacteremia - Prior blood cultures from 03/21/2024 grew Pseudomonas, Klebsiella, E. Coli -Repeat cultures on 03/25/2024 remain negative - Currently on Zosyn, continue in setting of diverticulitis; she was recently continued on Keflex at  discharge to complete prior course  Acute hypoxic respiratory failure - resolved  COPD Asthma exacerbation - Not on oxygen at home.  Hypoxic on presentation.  Also reported productive cough, low-grade fever.  CTA did not show any evidence of pneumonia but showed atelectasis.  This could be likely from COPD exacerbation .  Continue bronchodilators, incentive spirometer.  Weaned to room air  - no further wheezing appreciated today.  Discontinue steroids in setting of underlying infection -Continue nebulizers   Hypokalemia - Replete as needed   Chronic diastolic CHF - Appears euvolemic on presentation.  proBNP slightly elevated.  On gentle IV fluids   Chronic thrombocytopenia - Developed thrombocytopenia on last hospitalization.  CBC showed normal smear, DIC ruled out.  Liver imaging unremarkable.  Likely from sepsis.  Continue to monitor   Type 2 diabetes - Recent A1c of 7.  Continue sliding scale.  Monitor blood sugars   Hypertension - Hypotensive in presentation.  Currently blood pressure stable.  On losartan , Toprol  at home   Hyperlipidemia - Continue Lipitor   Tobacco use disorder: Counseled cessation.   Generalized weakness: PT consulted   Morbid obesity: BMI 42.3  Interval History:  No issues overnight.  Tolerating diet.  Changing pain regimen to oral today.  States she does not have anyone to take care of her at home until starting tomorrow.  We discussed discharge tomorrow.  Antimicrobials: Cefepime 11/3 x1 Flagyl 11/4 Zosyn 11/4 >> current   DVT prophylaxis:  SCDs Start: 03/25/24 2341   Code Status:   Code Status: Full Code  Mobility Assessment (Last 72 Hours)     Mobility Assessment     Row Name 03/28/24 0929 03/28/24 0900 03/27/24  2011 03/27/24 0800 03/26/24 1959   Does the patient have exclusion criteria? -- No - Perform mobility assessment No - Perform mobility assessment No - Perform mobility assessment No - Perform mobility assessment   What is the  highest level of mobility based on the mobility assessment? Level 4 (Ambulates with assistance) - Balance while stepping forward/back - Complete Level 4 (Ambulates with assistance) - Balance while stepping forward/back - Complete Level 4 (Ambulates with assistance) - Balance while stepping forward/back - Complete Level 4 (Ambulates with assistance) - Balance while stepping forward/back - Complete Level 4 (Ambulates with assistance) - Balance while stepping forward/back - Complete   Is the above level different from baseline mobility prior to current illness? -- Yes - Recommend PT order Yes - Recommend PT order Yes - Recommend PT order Yes - Recommend PT order    Row Name 03/26/24 1610 03/26/24 0800 03/26/24 0159       Does the patient have exclusion criteria? -- No - Perform mobility assessment No - Perform mobility assessment     What is the highest level of mobility based on the mobility assessment? Level 4 (Ambulates with assistance) - Balance while stepping forward/back - Complete Level 3 (Stands with assistance) - Balance while standing  and cannot march in place Level 3 (Stands with assistance) - Balance while standing  and cannot march in place     Is the above level different from baseline mobility prior to current illness? -- Yes - Recommend PT order Yes - Recommend PT order        Diet: Diet Orders (From admission, onward)     Start     Ordered   03/25/24 2341  Diet heart healthy/carb modified Room service appropriate? Yes; Fluid consistency: Thin  Diet effective now       Question Answer Comment  Diet-HS Snack? Nothing   Room service appropriate? Yes   Fluid consistency: Thin      03/25/24 2340            Barriers to discharge: none Disposition Plan:  Home HH orders placed: HHPT/OT Status is: Inpt  Objective: Blood pressure (!) 153/89, pulse 98, temperature 98.4 F (36.9 C), temperature source Oral, resp. rate 20, height 5' 9 (1.753 m), weight 130.1 kg, last menstrual  period 06/12/2011, SpO2 98%.  Examination:  Physical Exam Constitutional:      Appearance: Normal appearance. She is obese.  HENT:     Head: Normocephalic and atraumatic.     Mouth/Throat:     Mouth: Mucous membranes are moist.  Eyes:     Extraocular Movements: Extraocular movements intact.  Cardiovascular:     Rate and Rhythm: Normal rate and regular rhythm.  Pulmonary:     Effort: Pulmonary effort is normal. No respiratory distress.     Breath sounds: Normal breath sounds. No wheezing.  Abdominal:     General: Bowel sounds are normal. There is no distension.     Palpations: Abdomen is soft.     Tenderness: There is no abdominal tenderness.  Musculoskeletal:        General: Normal range of motion.     Cervical back: Normal range of motion and neck supple.  Skin:    General: Skin is warm and dry.  Neurological:     General: No focal deficit present.     Mental Status: She is alert.  Psychiatric:        Mood and Affect: Mood normal.      Consultants:  Procedures:    Data Reviewed: Results for orders placed or performed during the hospital encounter of 03/25/24 (from the past 24 hours)  Glucose, capillary     Status: Abnormal   Collection Time: 03/27/24  4:07 PM  Result Value Ref Range   Glucose-Capillary 344 (H) 70 - 99 mg/dL  Glucose, capillary     Status: Abnormal   Collection Time: 03/27/24  9:50 PM  Result Value Ref Range   Glucose-Capillary 284 (H) 70 - 99 mg/dL  Glucose, capillary     Status: Abnormal   Collection Time: 03/28/24  8:00 AM  Result Value Ref Range   Glucose-Capillary 217 (H) 70 - 99 mg/dL  Glucose, capillary     Status: Abnormal   Collection Time: 03/28/24 11:13 AM  Result Value Ref Range   Glucose-Capillary 296 (H) 70 - 99 mg/dL    I have reviewed pertinent nursing notes, vitals, labs, and images as necessary. I have ordered labwork to follow up on as indicated.  I have reviewed the last notes from staff over past 24 hours. I have  discussed patient's care plan and test results with nursing staff, CM/SW, and other staff as appropriate.  Old records reviewed in assessment of this patient  Time spent: Greater than 50% of the 55 minute visit was spent in counseling/coordination of care for the patient as laid out in the A&P.   LOS: 3 days   Alm Apo, MD [facility] 03/28/2024, 1:13 PM

## 2024-03-28 NOTE — Plan of Care (Signed)
  Problem: Education: Goal: Knowledge of General Education information will improve Description: Including pain rating scale, medication(s)/side effects and non-pharmacologic comfort measures Outcome: Progressing   Problem: Clinical Measurements: Goal: Ability to maintain clinical measurements within normal limits will improve Outcome: Progressing Goal: Will remain free from infection Outcome: Progressing Goal: Diagnostic test results will improve Outcome: Progressing Goal: Respiratory complications will improve Outcome: Progressing   Problem: Activity: Goal: Risk for activity intolerance will decrease Outcome: Progressing   Problem: Elimination: Goal: Will not experience complications related to bowel motility Outcome: Progressing   Problem: Pain Managment: Goal: General experience of comfort will improve and/or be controlled Outcome: Progressing   Problem: Fluid Volume: Goal: Ability to maintain a balanced intake and output will improve Outcome: Progressing   Problem: Metabolic: Goal: Ability to maintain appropriate glucose levels will improve Outcome: Progressing

## 2024-03-29 ENCOUNTER — Encounter: Payer: Self-pay | Admitting: Internal Medicine

## 2024-03-29 ENCOUNTER — Other Ambulatory Visit (HOSPITAL_COMMUNITY): Payer: Self-pay

## 2024-03-29 DIAGNOSIS — K5792 Diverticulitis of intestine, part unspecified, without perforation or abscess without bleeding: Secondary | ICD-10-CM | POA: Diagnosis not present

## 2024-03-29 DIAGNOSIS — A419 Sepsis, unspecified organism: Secondary | ICD-10-CM | POA: Diagnosis not present

## 2024-03-29 LAB — GLUCOSE, CAPILLARY: Glucose-Capillary: 272 mg/dL — ABNORMAL HIGH (ref 70–99)

## 2024-03-29 MED ORDER — CIPROFLOXACIN HCL 500 MG PO TABS
500.0000 mg | ORAL_TABLET | Freq: Two times a day (BID) | ORAL | Status: DC
  Administered 2024-03-29: 500 mg via ORAL
  Filled 2024-03-29: qty 1

## 2024-03-29 MED ORDER — SENNOSIDES-DOCUSATE SODIUM 8.6-50 MG PO TABS: 1.0000 | ORAL_TABLET | Freq: Every evening | ORAL | Status: DC | PRN

## 2024-03-29 MED ORDER — CIPROFLOXACIN HCL 500 MG PO TABS
500.0000 mg | ORAL_TABLET | Freq: Two times a day (BID) | ORAL | 0 refills | Status: AC
  Filled 2024-03-29: qty 14, 7d supply, fill #0

## 2024-03-29 MED ORDER — METRONIDAZOLE 500 MG PO TABS: 500.0000 mg | ORAL_TABLET | Freq: Two times a day (BID) | ORAL | Status: DC

## 2024-03-29 MED ORDER — OXYCODONE HCL 5 MG PO TABS
5.0000 mg | ORAL_TABLET | ORAL | 0 refills | Status: DC | PRN
  Filled 2024-03-29: qty 30, 5d supply, fill #0

## 2024-03-29 MED ORDER — METRONIDAZOLE 500 MG PO TABS
500.0000 mg | ORAL_TABLET | Freq: Two times a day (BID) | ORAL | 0 refills | Status: AC
  Filled 2024-03-29: qty 14, 7d supply, fill #0

## 2024-03-29 MED ORDER — POLYETHYLENE GLYCOL 3350 17 G PO PACK: 17.0000 g | PACK | Freq: Every day | ORAL | Status: DC

## 2024-03-29 NOTE — Discharge Summary (Addendum)
 Physician Discharge Summary   Lisa Ibarra FMW:[id number] DOB: 12/03/1960 DOA: 03/25/2024  PCP: Cesario Mutton, MD  Admit date: 03/25/2024 Discharge date: 03/29/2024  Admitted From: Home Disposition: Home Discharging physician: Alm Apo, MD Barriers to discharge: None  Recommendations at discharge: Continue encouraging smoking cessation and lifestyle changes in regards to obesity Follow-up with GI for outpatient colonoscopy in approximately 6 weeks   Discharge Condition: stable CODE STATUS: Full  Diet recommendation:  Diet Orders (From admission, onward)     Start     Ordered   03/29/24 0000  Diet - low sodium heart healthy        03/29/24 1009   03/29/24 0000  Diet Carb Modified        03/29/24 1009   03/25/24 2341  Diet heart healthy/carb modified Room service appropriate? Yes; Fluid consistency: Thin  Diet effective now       Question Answer Comment  Diet-HS Snack? Nothing   Room service appropriate? Yes   Fluid consistency: Thin      03/25/24 2340            Hospital Course: Lisa Ibarra is a 63 year old female with history of diabetes type 2, morbid obesity, chronic pain syndrome, hypertension, asthma/COPD, tobacco use disorder, chronic diastolic CHF, thrombocytopenia who presented for evaluation of generalized weakness, abdomen pain, nausea, vomiting.   She was recently hospitalized 10/30 - 11/2 for treatment for acute hypoxic respiratory failure secondary to COPD/exacerbation.  Blood cultures sent during that hospitalization had shown E. coli, Pseudomonas.   Also reported subjective fever at home.  On presentation, she was in sinus tachycardia, mildly hypertensive, requiring 4 L of oxygen per minute.  Lab work showed potassium of 3.3, platelet of 48, lactic acid of 2.   CTA PE study negative for PE but showed atelectasis in the mid and lower lungs.   CT abdomen/pelvis showed sigmoid diverticulosis with active diverticulitis in the mid sigmoid colon.  Started on IV  fluids, broad-spectrum antibiotics,culture sent.     Assessment & Plan:   Severe sepsis secondary to acute diverticulitis - Subjective fever, tachypnea, leukocytosis, elevated lactic.  CT showing diverticulitis - S/p IV fluids on admission due to hypotension - Overall clinically improved - Now tolerating diet with no nausea/vomiting and no diarrhea - Informed her clinically she is improving and approaching discharge - Also informed her clinically she should not be needing increased doses of pain medications if she is tolerating diet without active vomiting - Change pain regimen to oral; plan is d/c Friday - Continued on course of Cipro and Flagyl to complete - Patient recommended to follow-up with GI outpatient for colonoscopy  Recent bacteremia - Prior blood cultures from 03/21/2024 grew Pseudomonas, Klebsiella, E. Coli -Repeat cultures on 03/25/2024 remain negative - Currently on Zosyn, continue in setting of diverticulitis; continued on course of ciprofloxacin at discharge to complete  Acute hypoxic respiratory failure - resolved  COPD Asthma exacerbation - Not on oxygen at home.  Hypoxic on presentation.  Also reported productive cough, low-grade fever.  CTA did not show any evidence of pneumonia but showed atelectasis.  This could be likely from COPD exacerbation .  Continue bronchodilators, incentive spirometer.  Weaned to room air  - no further wheezing appreciated today.  Discontinue steroids in setting of underlying infection -Continue nebulizers   Hypokalemia - Repleted   Chronic diastolic CHF - Appears euvolemic on presentation.  proBNP slightly elevated   Chronic thrombocytopenia - Developed thrombocytopenia on last hospitalization.  CBC showed normal  smear, DIC ruled out.  Liver imaging unremarkable.  Likely from sepsis.  Continue to monitor   Type 2 diabetes - Recent A1c of 7.  Treated with SSI during hospitalization   Hypertension - Hypotensive in presentation.   Currently blood pressure stable.  On losartan , Toprol  at home   Hyperlipidemia - Continue Lipitor   Tobacco use disorder: Counseled on cessation   Generalized weakness: PT consulted   Morbid obesity: BMI 42.3   The patient's acute and chronic medical conditions were treated accordingly. On day of discharge, patient was felt deemed stable for discharge. Patient/family member advised to call PCP or come back to ER if needed.   Principal Diagnosis: Severe sepsis [facility])  Discharge Diagnoses: Active Hospital Problems   Diagnosis Date Noted   Acute diverticulitis 03/26/2024    Priority: 1.   Generalized weakness 03/26/2024   Obesity, Class III, BMI 40-49.9 (morbid obesity) (HCC) 03/26/2024   Chronic diastolic CHF (congestive heart failure) (HCC) 03/26/2024   Asthma with COPD with exacerbation (HCC) 03/26/2024    Resolved Hospital Problems   Diagnosis Date Noted Date Resolved   Severe sepsis [facility]) 03/25/2024 03/28/2024    Priority: 1.   Acute hypoxic respiratory failure (HCC) 08/18/2021 03/28/2024    Priority: 3.     Discharge Instructions     Diet - low sodium heart healthy   Complete by: As directed    Diet Carb Modified   Complete by: As directed    Increase activity slowly   Complete by: As directed       Allergies as of 03/29/2024       Reactions   Bee Venom Anaphylaxis, Hives   Empagliflozin Itching, Rash, Dermatitis   Ibuprofen Nausea And Vomiting   Statins Nausea Only, Other (See Comments)   Muscle spasms and cramps, too        Medication List     STOP taking these medications    predniSONE  10 MG tablet Commonly known as: DELTASONE        TAKE these medications    albuterol  108 (90 Base) MCG/ACT inhaler Commonly known as: VENTOLIN  HFA Inhale 2 puffs into the lungs every 6 (six) hours as needed for wheezing or shortness of breath.   arformoterol 15 MCG/2ML Nebu Commonly known as: BROVANA Take 2 mLs (15 mcg total) by nebulization 2 (two) times  daily.   atorvastatin 20 MG tablet Commonly known as: LIPITOR Take 20 mg by mouth daily.   blood glucose meter kit and supplies Kit Dispense based on patient and insurance preference. Use up to four times daily as directed.   budesonide 0.5 MG/2ML nebulizer solution Commonly known as: PULMICORT Take 2 mLs (0.5 mg total) by nebulization 2 (two) times daily.   ciprofloxacin 500 MG tablet Commonly known as: CIPRO Take 1 tablet (500 mg total) by mouth 2 (two) times daily for 7 days. What changed:  medication strength how much to take when to take this   furosemide  20 MG tablet Commonly known as: LASIX  Take 20-40 mg by mouth daily as needed for fluid or edema.   [employer] Extra Strength [id number] MG Pack Generic drug: Aspirin-Acetaminophen -Caffeine  Take 1 packet by mouth See admin instructions. Dissolve 1 packet in the mouth up to six times a day as needed for pain   Insulin  Pen Needle 29G X Misc For insulin  injection   losartan  25 MG tablet Commonly known as: COZAAR  Take 1 tablet (25 mg total) by mouth daily.   metoprolol  succinate 25 MG 24 hr  tablet Commonly known as: TOPROL -XL Take 0.5 tablets (12.5 mg total) by mouth at bedtime.   metroNIDAZOLE 500 MG tablet Commonly known as: FLAGYL Take 1 tablet (500 mg total) by mouth 2 (two) times daily for 7 days.   oxyCODONE  5 MG immediate release tablet Commonly known as: Oxy IR/ROXICODONE  Take 1 tablet (5 mg total) by mouth every 4 (four) hours as needed for moderate pain (pain score 4-6), severe pain (pain score 7-10) or breakthrough pain.   Ozempic (0.25 or 0.5 MG/DOSE) 2 MG/3ML Sopn Generic drug: Semaglutide(0.25 or 0.5MG /DOS) Inject 0.25 mg into the skin every Sunday.   polyethylene glycol 17 g packet Commonly known as: MIRALAX / GLYCOLAX Take 17 g by mouth daily.   potassium chloride  SA 20 MEQ tablet Commonly known as: KLOR-CON  M Take 20 mEq by mouth daily.   revefenacin 175 MCG/3ML nebulizer  solution Commonly known as: YUPELRI Take 3 mLs (175 mcg total) by nebulization daily.   senna-docusate 8.6-50 MG tablet Commonly known as: Senokot-S Take 1 tablet by mouth at bedtime as needed for mild constipation.        Contact information for after-discharge care     Home Medical Care     [facility]) .   Service: Home Health Services Why: [facility] will provide PT and OT in the home. Contact information: [address] [place]  [phone]                    Allergies  Allergen Reactions   Bee Venom Anaphylaxis and Hives   Empagliflozin Itching, Rash and Dermatitis   Ibuprofen Nausea And Vomiting   Statins Nausea Only and Other (See Comments)    Muscle spasms and cramps, too    Consultations:   Procedures:   Discharge Exam: BP (!) 149/71 (BP Location: Right Arm)   Pulse 62   Temp 99.2 F (37.3 C)   Resp 18   Ht 5' 9 (1.753 m)   Wt 130.1 kg   LMP 06/12/2011   SpO2 97%   BMI 42.36 kg/m  Physical Exam Constitutional:      Appearance: Normal appearance. She is obese.  HENT:     Head: Normocephalic and atraumatic.     Mouth/Throat:     Mouth: Mucous membranes are moist.  Eyes:     Extraocular Movements: Extraocular movements intact.  Cardiovascular:     Rate and Rhythm: Normal rate and regular rhythm.  Pulmonary:     Effort: Pulmonary effort is normal. No respiratory distress.     Breath sounds: Normal breath sounds. No wheezing.  Abdominal:     General: Bowel sounds are normal. There is no distension.     Palpations: Abdomen is soft.     Tenderness: There is no abdominal tenderness.  Musculoskeletal:        General: Normal range of motion.     Cervical back: Normal range of motion and neck supple.  Skin:    General: Skin is warm and dry.  Neurological:     General: No focal deficit present.     Mental Status: She is alert.  Psychiatric:        Mood and  Affect: Mood normal.      The results of significant diagnostics from this hospitalization (including imaging, microbiology, ancillary and laboratory) are listed below for reference.   Microbiology: Recent Results (from the past 240 hours)  Resp panel by RT-PCR (RSV, Flu A&B, Covid)  Anterior Nasal Swab     Status: None   Collection Time: 03/21/24  1:10 PM   Specimen: Anterior Nasal Swab  Result Value Ref Range Status   SARS Coronavirus 2 by RT PCR NEGATIVE NEGATIVE Final    Comment: (NOTE) SARS-CoV-2 target nucleic acids are NOT DETECTED.  The SARS-CoV-2 RNA is generally detectable in upper respiratory specimens during the acute phase of infection. The lowest concentration of SARS-CoV-2 viral copies this assay can detect is 138 copies/mL. A negative result does not preclude SARS-Cov-2 infection and should not be used as the sole basis for treatment or other patient management decisions. A negative result may occur with  improper specimen collection/handling, submission of specimen other than nasopharyngeal swab, presence of viral mutation(s) within the areas targeted by this assay, and inadequate number of viral copies(<138 copies/mL). A negative result must be combined with clinical observations, patient history, and epidemiological information. The expected result is Negative.  Fact Sheet for Patients:  bloggercourse.com  Fact Sheet for Healthcare Providers:  seriousbroker.it  This test is no t yet approved or cleared by the [place]  FDA and  has been authorized for detection and/or diagnosis of SARS-CoV-2 by FDA under an Emergency Use Authorization (EUA). This EUA will remain  in effect (meaning this test can be used) for the duration of the COVID-19 declaration under Section 564(b)(1) of the Act, 21 U.S.C.section 360bbb-3(b)(1), unless the authorization is terminated  or revoked sooner.       Influenza A by PCR  NEGATIVE NEGATIVE Final   Influenza B by PCR NEGATIVE NEGATIVE Final    Comment: (NOTE) The Xpert Xpress SARS-CoV-2/FLU/RSV plus assay is intended as an aid in the diagnosis of influenza from Nasopharyngeal swab specimens and should not be used as a sole basis for treatment. Nasal washings and aspirates are unacceptable for Xpert Xpress SARS-CoV-2/FLU/RSV testing.  Fact Sheet for Patients: bloggercourse.com  Fact Sheet for Healthcare Providers: seriousbroker.it  This test is not yet approved or cleared by the [place]  FDA and has been authorized for detection and/or diagnosis of SARS-CoV-2 by FDA under an Emergency Use Authorization (EUA). This EUA will remain in effect (meaning this test can be used) for the duration of the COVID-19 declaration under Section 564(b)(1) of the Act, 21 U.S.C. section 360bbb-3(b)(1), unless the authorization is terminated or revoked.     Resp Syncytial Virus by PCR NEGATIVE NEGATIVE Final    Comment: (NOTE) Fact Sheet for Patients: bloggercourse.com  Fact Sheet for Healthcare Providers: seriousbroker.it  This test is not yet approved or cleared by the [place]  FDA and has been authorized for detection and/or diagnosis of SARS-CoV-2 by FDA under an Emergency Use Authorization (EUA). This EUA will remain in effect (meaning this test can be used) for the duration of the COVID-19 declaration under Section 564(b)(1) of the Act, 21 U.S.C. section 360bbb-3(b)(1), unless the authorization is terminated or revoked.  Performed at [facility], [address], [place], [place] [address]   Blood Culture (routine x 2)     Status: Abnormal   Collection Time: 03/21/24  1:10 PM   Specimen: BLOOD  Result Value Ref Range Status   Specimen Description   Final    BLOOD RIGHT ANTECUBITAL Performed at [facility],  [address] [place], [place] [address]    Special Requests   Final    BOTTLES DRAWN AEROBIC AND ANAEROBIC Blood Culture results may not be optimal due to an inadequate volume of blood received in  culture bottles Performed at [facility], [address], [place], [place] [address]    Culture  Setup Time   Final    GRAM NEGATIVE RODS IN BOTH AEROBIC AND ANAEROBIC BOTTLES CRITICAL VALUE NOTED.  VALUE IS CONSISTENT WITH PREVIOUSLY REPORTED AND CALLED VALUE.    Culture (A)  Final    KLEBSIELLA PNEUMONIAE PSEUDOMONAS AERUGINOSA SUSCEPTIBILITIES PERFORMED ON PREVIOUS CULTURE WITHIN THE LAST 5 DAYS. CRITICAL RESULT CALLED TO, READ BACK BY AND VERIFIED WITH: PHARMD A PHAN [id number] AT 1010 BU CM Performed at [facility], [address], [place], [place] [address]    Report Status 03/27/2024 FINAL  Final   Organism ID, Bacteria KLEBSIELLA PNEUMONIAE  Final      Susceptibility   Klebsiella pneumoniae - MIC*    AMPICILLIN >=32 RESISTANT Resistant     CEFAZOLIN (NON-URINE) 2 SENSITIVE Sensitive     CEFEPIME <=0.12 SENSITIVE Sensitive     ERTAPENEM <=0.12 SENSITIVE Sensitive     CEFTRIAXONE <=0.25 SENSITIVE Sensitive     CIPROFLOXACIN <=0.06 SENSITIVE Sensitive     GENTAMICIN <=1 SENSITIVE Sensitive     MEROPENEM <=0.25 SENSITIVE Sensitive     TRIMETH/SULFA <=20 SENSITIVE Sensitive     AMPICILLIN/SULBACTAM 4 SENSITIVE Sensitive     PIP/TAZO Value in next row Sensitive      <=4 SENSITIVEThis is a modified FDA-approved test that has been validated and its performance characteristics determined by the reporting laboratory.  This laboratory is certified under the Clinical Laboratory Improvement Amendments CLIA as qualified to perform high complexity clinical laboratory testing.    * KLEBSIELLA PNEUMONIAE  Blood Culture (routine x 2)     Status: None   Collection Time: 03/21/24  1:10 PM   Specimen: BLOOD  Result Value Ref Range Status   Specimen Description   Final     BLOOD LEFT ANTECUBITAL Performed at [facility], [address], [place], [place] [address]    Special Requests   Final    BOTTLES DRAWN AEROBIC AND ANAEROBIC Blood Culture results may not be optimal due to an inadequate volume of blood received in culture bottles Performed at [facility], [address], [place], [place] [address]    Culture  Setup Time   Final    GRAM NEGATIVE RODS IN BOTH AEROBIC AND ANAEROBIC BOTTLES CRITICAL RESULT CALLED TO, READ BACK BY AND VERIFIED WITH: PHARMD ADESOUA UTOMWEN ON 03/22/24 @ 1317 BY DRT Performed at [facility], [address] [place], [place] [address]    Culture   Final    Two isolates with different morphologies were identified as the same organism.The most resistant organism was reported. ESCHERICHIA COLI PSEUDOMONAS AERUGINOSA    Report Status 03/25/2024 FINAL  Final   Organism ID, Bacteria ESCHERICHIA COLI  Final   Organism ID, Bacteria PSEUDOMONAS AERUGINOSA  Final      Susceptibility   Escherichia coli - MIC*    AMPICILLIN 4 SENSITIVE Sensitive     CEFAZOLIN (NON-URINE) <=1 SENSITIVE Sensitive     CEFEPIME <=0.12 SENSITIVE Sensitive     ERTAPENEM <=0.12 SENSITIVE Sensitive     CEFTRIAXONE <=0.25 SENSITIVE Sensitive     CIPROFLOXACIN <=0.06 SENSITIVE Sensitive     GENTAMICIN <=1 SENSITIVE Sensitive     MEROPENEM <=0.25 SENSITIVE Sensitive     TRIMETH/SULFA <=20 SENSITIVE Sensitive     AMPICILLIN/SULBACTAM <=2 SENSITIVE Sensitive     PIP/TAZO Value in next row Sensitive      <=4  SENSITIVEThis is a modified FDA-approved test that has been validated and its performance characteristics determined by the reporting laboratory.  This laboratory is certified under the Clinical Laboratory Improvement Amendments CLIA as qualified to perform high complexity clinical laboratory testing.    * ESCHERICHIA COLI   Pseudomonas aeruginosa - MIC*    MEROPENEM Value in next row Sensitive      <=4  SENSITIVEThis is a modified FDA-approved test that has been validated and its performance characteristics determined by the reporting laboratory.  This laboratory is certified under the Clinical Laboratory Improvement Amendments CLIA as qualified to perform high complexity clinical laboratory testing.    CIPROFLOXACIN Value in next row Sensitive      <=4 SENSITIVEThis is a modified FDA-approved test that has been validated and its performance characteristics determined by the reporting laboratory.  This laboratory is certified under the Clinical Laboratory Improvement Amendments CLIA as qualified to perform high complexity clinical laboratory testing.    IMIPENEM Value in next row Sensitive      <=4 SENSITIVEThis is a modified FDA-approved test that has been validated and its performance characteristics determined by the reporting laboratory.  This laboratory is certified under the Clinical Laboratory Improvement Amendments CLIA as qualified to perform high complexity clinical laboratory testing.    PIP/TAZO Value in next row Sensitive      <=4 SENSITIVEThis is a modified FDA-approved test that has been validated and its performance characteristics determined by the reporting laboratory.  This laboratory is certified under the Clinical Laboratory Improvement Amendments CLIA as qualified to perform high complexity clinical laboratory testing.    CEFEPIME Value in next row Sensitive      <=4 SENSITIVEThis is a modified FDA-approved test that has been validated and its performance characteristics determined by the reporting laboratory.  This laboratory is certified under the Clinical Laboratory Improvement Amendments CLIA as qualified to perform high complexity clinical laboratory testing.    CEFTAZIDIME/AVIBACTAM Value in next row Sensitive      <=4 SENSITIVEThis is a modified FDA-approved test that has been validated and its performance characteristics determined by the reporting laboratory.  This  laboratory is certified under the Clinical Laboratory Improvement Amendments CLIA as qualified to perform high complexity clinical laboratory testing.    CEFTOLOZANE/TAZOBACTAM Value in next row Sensitive      <=4 SENSITIVEThis is a modified FDA-approved test that has been validated and its performance characteristics determined by the reporting laboratory.  This laboratory is certified under the Clinical Laboratory Improvement Amendments CLIA as qualified to perform high complexity clinical laboratory testing.    TOBRAMYCIN Value in next row Sensitive      <=4 SENSITIVEThis is a modified FDA-approved test that has been validated and its performance characteristics determined by the reporting laboratory.  This laboratory is certified under the Clinical Laboratory Improvement Amendments CLIA as qualified to perform high complexity clinical laboratory testing.    CEFTAZIDIME Value in next row Sensitive      <=4 SENSITIVEThis is a modified FDA-approved test that has been validated and its performance characteristics determined by the reporting laboratory.  This laboratory is certified under the Clinical Laboratory Improvement Amendments CLIA as qualified to perform high complexity clinical laboratory testing.    * PSEUDOMONAS AERUGINOSA  Respiratory (~20 pathogens) panel by PCR     Status: None   Collection Time: 03/21/24  1:10 PM   Specimen: Nasopharyngeal Swab; Respiratory  Result Value Ref Range Status   Adenovirus NOT DETECTED NOT DETECTED Final   Coronavirus 229E  NOT DETECTED NOT DETECTED Final    Comment: (NOTE) The Coronavirus on the Respiratory Panel, DOES NOT test for the novel  Coronavirus (2019 nCoV)    Coronavirus HKU1 NOT DETECTED NOT DETECTED Final   Coronavirus NL63 NOT DETECTED NOT DETECTED Final   Coronavirus OC43 NOT DETECTED NOT DETECTED Final   Metapneumovirus NOT DETECTED NOT DETECTED Final   Rhinovirus / Enterovirus NOT DETECTED NOT DETECTED Final   Influenza A NOT DETECTED  NOT DETECTED Final   Influenza B NOT DETECTED NOT DETECTED Final   Parainfluenza Virus 1 NOT DETECTED NOT DETECTED Final   Parainfluenza Virus 2 NOT DETECTED NOT DETECTED Final   Parainfluenza Virus 3 NOT DETECTED NOT DETECTED Final   Parainfluenza Virus 4 NOT DETECTED NOT DETECTED Final   Respiratory Syncytial Virus NOT DETECTED NOT DETECTED Final   Bordetella pertussis NOT DETECTED NOT DETECTED Final   Bordetella Parapertussis NOT DETECTED NOT DETECTED Final   Chlamydophila pneumoniae NOT DETECTED NOT DETECTED Final   Mycoplasma pneumoniae NOT DETECTED NOT DETECTED Final    Comment: Performed at [facility], [address], [place], [place] [address]  Blood Culture ID Panel (Reflexed)     Status: Abnormal   Collection Time: 03/21/24  1:10 PM  Result Value Ref Range Status   Enterococcus faecalis NOT DETECTED NOT DETECTED Final   Enterococcus Faecium NOT DETECTED NOT DETECTED Final   Listeria monocytogenes NOT DETECTED NOT DETECTED Final   Staphylococcus species NOT DETECTED NOT DETECTED Final   Staphylococcus aureus (BCID) NOT DETECTED NOT DETECTED Final   Staphylococcus epidermidis NOT DETECTED NOT DETECTED Final   Staphylococcus lugdunensis NOT DETECTED NOT DETECTED Final   Streptococcus species NOT DETECTED NOT DETECTED Final   Streptococcus agalactiae NOT DETECTED NOT DETECTED Final   Streptococcus pneumoniae NOT DETECTED NOT DETECTED Final   Streptococcus pyogenes NOT DETECTED NOT DETECTED Final   A.calcoaceticus-baumannii NOT DETECTED NOT DETECTED Final   Bacteroides fragilis NOT DETECTED NOT DETECTED Final   Enterobacterales DETECTED (A) NOT DETECTED Final    Comment: Enterobacterales represent a large order of gram negative bacteria, not a single organism. CRITICAL RESULT CALLED TO, READ BACK BY AND VERIFIED WITH: PHARMD ADESOUA UTOMWEN ON 03/22/24 @ 1346 BY DRT    Enterobacter cloacae complex NOT DETECTED NOT DETECTED Final   Escherichia coli DETECTED (A) NOT  DETECTED Final    Comment: CRITICAL RESULT CALLED TO, READ BACK BY AND VERIFIED WITH: PHARMD ADESOUA UTOMWEN ON 03/22/24 @ 1346 BY DRT    Klebsiella aerogenes NOT DETECTED NOT DETECTED Final   Klebsiella oxytoca NOT DETECTED NOT DETECTED Final   Klebsiella pneumoniae NOT DETECTED NOT DETECTED Final   Proteus species NOT DETECTED NOT DETECTED Final   Salmonella species NOT DETECTED NOT DETECTED Final   Serratia marcescens NOT DETECTED NOT DETECTED Final   Haemophilus influenzae NOT DETECTED NOT DETECTED Final   Neisseria meningitidis NOT DETECTED NOT DETECTED Final   Pseudomonas aeruginosa DETECTED (A) NOT DETECTED Final    Comment: CRITICAL RESULT CALLED TO, READ BACK BY AND VERIFIED WITH: PHARMD ADESOUA UTOMWEN ON 03/22/24 @ 1345 BY DRT    Stenotrophomonas maltophilia NOT DETECTED NOT DETECTED Final   Candida albicans NOT DETECTED NOT DETECTED Final   Candida auris NOT DETECTED NOT DETECTED Final   Candida glabrata NOT DETECTED NOT DETECTED Final   Candida krusei NOT DETECTED NOT DETECTED Final   Candida parapsilosis NOT DETECTED NOT DETECTED Final   Candida tropicalis NOT DETECTED NOT DETECTED Final   Cryptococcus neoformans/gattii NOT DETECTED  NOT DETECTED Final   CTX-M ESBL NOT DETECTED NOT DETECTED Final   Carbapenem resistance IMP NOT DETECTED NOT DETECTED Final   Carbapenem resistance KPC NOT DETECTED NOT DETECTED Final   Carbapenem resistance NDM NOT DETECTED NOT DETECTED Final   Carbapenem resist OXA 48 LIKE NOT DETECTED NOT DETECTED Final   Carbapenem resistance VIM NOT DETECTED NOT DETECTED Final    Comment: Performed at [facility], [address], [place], [place] [address]  MRSA Next Gen by PCR, Nasal     Status: None   Collection Time: 03/21/24  5:49 PM   Specimen: Nasal Mucosa; Nasal Swab  Result Value Ref Range Status   MRSA by PCR Next Gen NOT DETECTED NOT DETECTED Final    Comment: (NOTE) The GeneXpert MRSA Assay (FDA approved for NASAL specimens  only), is one component of a comprehensive MRSA colonization surveillance program. It is not intended to diagnose MRSA infection nor to guide or monitor treatment for MRSA infections. Test performance is not FDA approved in patients less than 15 years old. Performed at [facility], [address], [place], [place] [address]   Culture, blood (routine x 2)     Status: None (Preliminary result)   Collection Time: 03/25/24  8:08 PM   Specimen: BLOOD LEFT HAND  Result Value Ref Range Status   Specimen Description   Final    BLOOD LEFT HAND Performed at [facility], [address], [place], [place] [address]    Special Requests   Final    BOTTLES DRAWN AEROBIC AND ANAEROBIC Blood Culture adequate volume Performed at [facility], [address], [place], [place] [address]    Culture   Final    NO GROWTH 3 DAYS Performed at [facility], [address] [place], [place] [address]    Report Status PENDING  Incomplete  MRSA Next Gen by PCR, Nasal     Status: None   Collection Time: 03/26/24 12:31 AM   Specimen: Nasal Mucosa; Nasal Swab  Result Value Ref Range Status   MRSA by PCR Next Gen NOT DETECTED NOT DETECTED Final    Comment: (NOTE) The GeneXpert MRSA Assay (FDA approved for NASAL specimens only), is one component of a comprehensive MRSA colonization surveillance program. It is not intended to diagnose MRSA infection nor to guide or monitor treatment for MRSA infections. Test performance is not FDA approved in patients less than 70 years old. Performed at [facility], [address] [place], [place] [address]   Culture, blood (routine x 2)     Status: None (Preliminary result)   Collection Time: 03/26/24 12:57 AM   Specimen: BLOOD RIGHT ARM  Result Value Ref Range Status   Specimen Description   Final    BLOOD RIGHT ARM Performed at [facility], [address], [place], [place]  [address]    Special Requests   Final    Blood Culture results may not be optimal due to an inadequate volume of blood received in culture bottles BOTTLES DRAWN AEROBIC AND ANAEROBIC Performed at [facility], [address], [place], [place] [address]    Culture   Final    NO GROWTH 3 DAYS Performed at [facility], [address] [place], [place] [address]    Report Status PENDING  Incomplete     Labs: BNP (last 3 results) No results for input(s): BNP in the last 8760 hours.  Basic Metabolic Panel: Recent Labs  Lab 03/23/24 0454 03/25/24 1938 03/26/24 0057 03/27/24 0540  NA 137 138 136 136  K 4.5 3.3* 3.5 4.3  CL 103 100 100 102  CO2 27 26 27 24   GLUCOSE 157* 202* 241* 191*  BUN 21 14 15 13   CREATININE 0.85 0.88 0.88 0.71  CALCIUM 9.3 9.2 8.8* 8.9  MG 2.3  --   --   --   PHOS 2.7  --   --   --    Liver Function Tests: Recent Labs  Lab 03/25/24 1938  AST 17  ALT 17  ALKPHOS 104  BILITOT 2.5*  PROT 6.4*  ALBUMIN 2.9*   Recent Labs  Lab 03/25/24 1938  LIPASE 20   No results for input(s): AMMONIA in the last 168 hours. CBC: Recent Labs  Lab 03/23/24 0454 03/25/24 1938 03/26/24 0057 03/27/24 0540  WBC 12.8* 9.2 14.1* 8.9  NEUTROABS  --  8.1*  --   --   HGB 15.9* 16.8* 15.2* 15.7*  HCT 52.1* 52.3* 47.3* 48.8*  MCV 101.4* 96.1 98.7 99.0  PLT 61* 48* 52* 64*   Cardiac Enzymes: No results for input(s): CKTOTAL, CKMB, CKMBINDEX, TROPONINI in the last 168 hours. BNP: Invalid input(s): POCBNP CBG: Recent Labs  Lab 03/28/24 0800 03/28/24 1113 03/28/24 1649 03/28/24 2133 03/29/24 0726  GLUCAP 217* 296* 272* 361* 272*   D-Dimer No results for input(s): DDIMER in the last 72 hours. Hgb A1c No results for input(s): HGBA1C in the last 72 hours. Lipid Profile No results for input(s): CHOL, HDL, LDLCALC, TRIG, CHOLHDL, LDLDIRECT in the last 72 hours. Thyroid function studies No results for input(s):  TSH, T4TOTAL, T3FREE, THYROIDAB in the last 72 hours.  Invalid input(s): FREET3 Anemia work up No results for input(s): VITAMINB12, FOLATE, FERRITIN, TIBC, IRON, RETICCTPCT in the last 72 hours. Urinalysis    Component Value Date/Time   COLORURINE STRAW (A) 03/22/2024 1234   APPEARANCEUR CLEAR 03/22/2024 1234   LABSPEC 1.006 03/22/2024 1234   PHURINE 5.0 03/22/2024 1234   GLUCOSEU NEGATIVE 03/22/2024 1234   HGBUR SMALL (A) 03/22/2024 1234   BILIRUBINUR NEGATIVE 03/22/2024 1234   KETONESUR NEGATIVE 03/22/2024 1234   PROTEINUR NEGATIVE 03/22/2024 1234   UROBILINOGEN 1.0 04/01/2015 0948   NITRITE NEGATIVE 03/22/2024 1234   LEUKOCYTESUR NEGATIVE 03/22/2024 1234   Sepsis Labs Recent Labs  Lab 03/23/24 0454 03/25/24 1938 03/26/24 0057 03/27/24 0540  WBC 12.8* 9.2 14.1* 8.9   Microbiology Recent Results (from the past 240 hours)  Resp panel by RT-PCR (RSV, Flu A&B, Covid) Anterior Nasal Swab     Status: None   Collection Time: 03/21/24  1:10 PM   Specimen: Anterior Nasal Swab  Result Value Ref Range Status   SARS Coronavirus 2 by RT PCR NEGATIVE NEGATIVE Final    Comment: (NOTE) SARS-CoV-2 target nucleic acids are NOT DETECTED.  The SARS-CoV-2 RNA is generally detectable in upper respiratory specimens during the acute phase of infection. The lowest concentration of SARS-CoV-2 viral copies this assay can detect is 138 copies/mL. A negative result does not preclude SARS-Cov-2 infection and should not be used as the sole basis for treatment or other patient management decisions. A negative result may occur with  improper specimen collection/handling, submission of specimen other than nasopharyngeal swab, presence of viral mutation(s) within the areas targeted by this assay, and inadequate number of viral copies(<138 copies/mL). A negative result must be combined with clinical observations, patient history, and epidemiological information. The expected  result  is Negative.  Fact Sheet for Patients:  bloggercourse.com  Fact Sheet for Healthcare Providers:  seriousbroker.it  This test is no t yet approved or cleared by the [place]  FDA and  has been authorized for detection and/or diagnosis of SARS-CoV-2 by FDA under an Emergency Use Authorization (EUA). This EUA will remain  in effect (meaning this test can be used) for the duration of the COVID-19 declaration under Section 564(b)(1) of the Act, 21 U.S.C.section 360bbb-3(b)(1), unless the authorization is terminated  or revoked sooner.       Influenza A by PCR NEGATIVE NEGATIVE Final   Influenza B by PCR NEGATIVE NEGATIVE Final    Comment: (NOTE) The Xpert Xpress SARS-CoV-2/FLU/RSV plus assay is intended as an aid in the diagnosis of influenza from Nasopharyngeal swab specimens and should not be used as a sole basis for treatment. Nasal washings and aspirates are unacceptable for Xpert Xpress SARS-CoV-2/FLU/RSV testing.  Fact Sheet for Patients: bloggercourse.com  Fact Sheet for Healthcare Providers: seriousbroker.it  This test is not yet approved or cleared by the [place]  FDA and has been authorized for detection and/or diagnosis of SARS-CoV-2 by FDA under an Emergency Use Authorization (EUA). This EUA will remain in effect (meaning this test can be used) for the duration of the COVID-19 declaration under Section 564(b)(1) of the Act, 21 U.S.C. section 360bbb-3(b)(1), unless the authorization is terminated or revoked.     Resp Syncytial Virus by PCR NEGATIVE NEGATIVE Final    Comment: (NOTE) Fact Sheet for Patients: bloggercourse.com  Fact Sheet for Healthcare Providers: seriousbroker.it  This test is not yet approved or cleared by the [place]  FDA and has been authorized for detection and/or diagnosis of  SARS-CoV-2 by FDA under an Emergency Use Authorization (EUA). This EUA will remain in effect (meaning this test can be used) for the duration of the COVID-19 declaration under Section 564(b)(1) of the Act, 21 U.S.C. section 360bbb-3(b)(1), unless the authorization is terminated or revoked.  Performed at [facility], [address], [place], [place] [address]   Blood Culture (routine x 2)     Status: Abnormal   Collection Time: 03/21/24  1:10 PM   Specimen: BLOOD  Result Value Ref Range Status   Specimen Description   Final    BLOOD RIGHT ANTECUBITAL Performed at [facility], [address], [place], [place] [address]    Special Requests   Final    BOTTLES DRAWN AEROBIC AND ANAEROBIC Blood Culture results may not be optimal due to an inadequate volume of blood received in culture bottles Performed at [facility], [address] [place], [place] [address]    Culture  Setup Time   Final    GRAM NEGATIVE RODS IN BOTH AEROBIC AND ANAEROBIC BOTTLES CRITICAL VALUE NOTED.  VALUE IS CONSISTENT WITH PREVIOUSLY REPORTED AND CALLED VALUE.    Culture (A)  Final    KLEBSIELLA PNEUMONIAE PSEUDOMONAS AERUGINOSA SUSCEPTIBILITIES PERFORMED ON PREVIOUS CULTURE WITHIN THE LAST 5 DAYS. CRITICAL RESULT CALLED TO, READ BACK BY AND VERIFIED WITH: PHARMD A PHAN [id number] AT 1010 BU CM Performed at [facility], [address] [place], [place] [address]    Report Status 03/27/2024 FINAL  Final   Organism ID, Bacteria KLEBSIELLA PNEUMONIAE  Final      Susceptibility   Klebsiella pneumoniae - MIC*    AMPICILLIN >=32 RESISTANT Resistant     CEFAZOLIN (NON-URINE) 2 SENSITIVE Sensitive     CEFEPIME <=0.12 SENSITIVE Sensitive  ERTAPENEM <=0.12 SENSITIVE Sensitive     CEFTRIAXONE <=0.25 SENSITIVE Sensitive     CIPROFLOXACIN <=0.06 SENSITIVE Sensitive     GENTAMICIN <=1 SENSITIVE Sensitive     MEROPENEM <=0.25 SENSITIVE Sensitive      TRIMETH/SULFA <=20 SENSITIVE Sensitive     AMPICILLIN/SULBACTAM 4 SENSITIVE Sensitive     PIP/TAZO Value in next row Sensitive      <=4 SENSITIVEThis is a modified FDA-approved test that has been validated and its performance characteristics determined by the reporting laboratory.  This laboratory is certified under the Clinical Laboratory Improvement Amendments CLIA as qualified to perform high complexity clinical laboratory testing.    * KLEBSIELLA PNEUMONIAE  Blood Culture (routine x 2)     Status: None   Collection Time: 03/21/24  1:10 PM   Specimen: BLOOD  Result Value Ref Range Status   Specimen Description   Final    BLOOD LEFT ANTECUBITAL Performed at [facility], [address], [place], [place] [address]    Special Requests   Final    BOTTLES DRAWN AEROBIC AND ANAEROBIC Blood Culture results may not be optimal due to an inadequate volume of blood received in culture bottles Performed at [facility], [address], [place], [place] [address]    Culture  Setup Time   Final    GRAM NEGATIVE RODS IN BOTH AEROBIC AND ANAEROBIC BOTTLES CRITICAL RESULT CALLED TO, READ BACK BY AND VERIFIED WITH: PHARMD ADESOUA UTOMWEN ON 03/22/24 @ 1317 BY DRT Performed at [facility], [address] [place], [place] [address]    Culture   Final    Two isolates with different morphologies were identified as the same organism.The most resistant organism was reported. ESCHERICHIA COLI PSEUDOMONAS AERUGINOSA    Report Status 03/25/2024 FINAL  Final   Organism ID, Bacteria ESCHERICHIA COLI  Final   Organism ID, Bacteria PSEUDOMONAS AERUGINOSA  Final      Susceptibility   Escherichia coli - MIC*    AMPICILLIN 4 SENSITIVE Sensitive     CEFAZOLIN (NON-URINE) <=1 SENSITIVE Sensitive     CEFEPIME <=0.12 SENSITIVE Sensitive     ERTAPENEM <=0.12 SENSITIVE Sensitive     CEFTRIAXONE <=0.25 SENSITIVE Sensitive     CIPROFLOXACIN <=0.06 SENSITIVE Sensitive      GENTAMICIN <=1 SENSITIVE Sensitive     MEROPENEM <=0.25 SENSITIVE Sensitive     TRIMETH/SULFA <=20 SENSITIVE Sensitive     AMPICILLIN/SULBACTAM <=2 SENSITIVE Sensitive     PIP/TAZO Value in next row Sensitive      <=4 SENSITIVEThis is a modified FDA-approved test that has been validated and its performance characteristics determined by the reporting laboratory.  This laboratory is certified under the Clinical Laboratory Improvement Amendments CLIA as qualified to perform high complexity clinical laboratory testing.    * ESCHERICHIA COLI   Pseudomonas aeruginosa - MIC*    MEROPENEM Value in next row Sensitive      <=4 SENSITIVEThis is a modified FDA-approved test that has been validated and its performance characteristics determined by the reporting laboratory.  This laboratory is certified under the Clinical Laboratory Improvement Amendments CLIA as qualified to perform high complexity clinical laboratory testing.    CIPROFLOXACIN Value in next row Sensitive      <=4 SENSITIVEThis is a modified FDA-approved test that has been validated and its performance characteristics determined by the reporting laboratory.  This laboratory is certified under the Clinical Laboratory Improvement Amendments CLIA as qualified to perform high complexity clinical laboratory testing.  IMIPENEM Value in next row Sensitive      <=4 SENSITIVEThis is a modified FDA-approved test that has been validated and its performance characteristics determined by the reporting laboratory.  This laboratory is certified under the Clinical Laboratory Improvement Amendments CLIA as qualified to perform high complexity clinical laboratory testing.    PIP/TAZO Value in next row Sensitive      <=4 SENSITIVEThis is a modified FDA-approved test that has been validated and its performance characteristics determined by the reporting laboratory.  This laboratory is certified under the Clinical Laboratory Improvement Amendments CLIA as qualified  to perform high complexity clinical laboratory testing.    CEFEPIME Value in next row Sensitive      <=4 SENSITIVEThis is a modified FDA-approved test that has been validated and its performance characteristics determined by the reporting laboratory.  This laboratory is certified under the Clinical Laboratory Improvement Amendments CLIA as qualified to perform high complexity clinical laboratory testing.    CEFTAZIDIME/AVIBACTAM Value in next row Sensitive      <=4 SENSITIVEThis is a modified FDA-approved test that has been validated and its performance characteristics determined by the reporting laboratory.  This laboratory is certified under the Clinical Laboratory Improvement Amendments CLIA as qualified to perform high complexity clinical laboratory testing.    CEFTOLOZANE/TAZOBACTAM Value in next row Sensitive      <=4 SENSITIVEThis is a modified FDA-approved test that has been validated and its performance characteristics determined by the reporting laboratory.  This laboratory is certified under the Clinical Laboratory Improvement Amendments CLIA as qualified to perform high complexity clinical laboratory testing.    TOBRAMYCIN Value in next row Sensitive      <=4 SENSITIVEThis is a modified FDA-approved test that has been validated and its performance characteristics determined by the reporting laboratory.  This laboratory is certified under the Clinical Laboratory Improvement Amendments CLIA as qualified to perform high complexity clinical laboratory testing.    CEFTAZIDIME Value in next row Sensitive      <=4 SENSITIVEThis is a modified FDA-approved test that has been validated and its performance characteristics determined by the reporting laboratory.  This laboratory is certified under the Clinical Laboratory Improvement Amendments CLIA as qualified to perform high complexity clinical laboratory testing.    * PSEUDOMONAS AERUGINOSA  Respiratory (~20 pathogens) panel by PCR     Status: None    Collection Time: 03/21/24  1:10 PM   Specimen: Nasopharyngeal Swab; Respiratory  Result Value Ref Range Status   Adenovirus NOT DETECTED NOT DETECTED Final   Coronavirus 229E NOT DETECTED NOT DETECTED Final    Comment: (NOTE) The Coronavirus on the Respiratory Panel, DOES NOT test for the novel  Coronavirus (2019 nCoV)    Coronavirus HKU1 NOT DETECTED NOT DETECTED Final   Coronavirus NL63 NOT DETECTED NOT DETECTED Final   Coronavirus OC43 NOT DETECTED NOT DETECTED Final   Metapneumovirus NOT DETECTED NOT DETECTED Final   Rhinovirus / Enterovirus NOT DETECTED NOT DETECTED Final   Influenza A NOT DETECTED NOT DETECTED Final   Influenza B NOT DETECTED NOT DETECTED Final   Parainfluenza Virus 1 NOT DETECTED NOT DETECTED Final   Parainfluenza Virus 2 NOT DETECTED NOT DETECTED Final   Parainfluenza Virus 3 NOT DETECTED NOT DETECTED Final   Parainfluenza Virus 4 NOT DETECTED NOT DETECTED Final   Respiratory Syncytial Virus NOT DETECTED NOT DETECTED Final   Bordetella pertussis NOT DETECTED NOT DETECTED Final   Bordetella Parapertussis NOT DETECTED NOT DETECTED Final   Chlamydophila pneumoniae NOT DETECTED NOT  DETECTED Final   Mycoplasma pneumoniae NOT DETECTED NOT DETECTED Final    Comment: Performed at [facility], [address] [place], [place] [address]  Blood Culture ID Panel (Reflexed)     Status: Abnormal   Collection Time: 03/21/24  1:10 PM  Result Value Ref Range Status   Enterococcus faecalis NOT DETECTED NOT DETECTED Final   Enterococcus Faecium NOT DETECTED NOT DETECTED Final   Listeria monocytogenes NOT DETECTED NOT DETECTED Final   Staphylococcus species NOT DETECTED NOT DETECTED Final   Staphylococcus aureus (BCID) NOT DETECTED NOT DETECTED Final   Staphylococcus epidermidis NOT DETECTED NOT DETECTED Final   Staphylococcus lugdunensis NOT DETECTED NOT DETECTED Final   Streptococcus species NOT DETECTED NOT DETECTED Final   Streptococcus agalactiae NOT DETECTED NOT  DETECTED Final   Streptococcus pneumoniae NOT DETECTED NOT DETECTED Final   Streptococcus pyogenes NOT DETECTED NOT DETECTED Final   A.calcoaceticus-baumannii NOT DETECTED NOT DETECTED Final   Bacteroides fragilis NOT DETECTED NOT DETECTED Final   Enterobacterales DETECTED (A) NOT DETECTED Final    Comment: Enterobacterales represent a large order of gram negative bacteria, not a single organism. CRITICAL RESULT CALLED TO, READ BACK BY AND VERIFIED WITH: PHARMD ADESOUA UTOMWEN ON 03/22/24 @ 1346 BY DRT    Enterobacter cloacae complex NOT DETECTED NOT DETECTED Final   Escherichia coli DETECTED (A) NOT DETECTED Final    Comment: CRITICAL RESULT CALLED TO, READ BACK BY AND VERIFIED WITH: PHARMD ADESOUA UTOMWEN ON 03/22/24 @ 1346 BY DRT    Klebsiella aerogenes NOT DETECTED NOT DETECTED Final   Klebsiella oxytoca NOT DETECTED NOT DETECTED Final   Klebsiella pneumoniae NOT DETECTED NOT DETECTED Final   Proteus species NOT DETECTED NOT DETECTED Final   Salmonella species NOT DETECTED NOT DETECTED Final   Serratia marcescens NOT DETECTED NOT DETECTED Final   Haemophilus influenzae NOT DETECTED NOT DETECTED Final   Neisseria meningitidis NOT DETECTED NOT DETECTED Final   Pseudomonas aeruginosa DETECTED (A) NOT DETECTED Final    Comment: CRITICAL RESULT CALLED TO, READ BACK BY AND VERIFIED WITH: PHARMD ADESOUA UTOMWEN ON 03/22/24 @ 1345 BY DRT    Stenotrophomonas maltophilia NOT DETECTED NOT DETECTED Final   Candida albicans NOT DETECTED NOT DETECTED Final   Candida auris NOT DETECTED NOT DETECTED Final   Candida glabrata NOT DETECTED NOT DETECTED Final   Candida krusei NOT DETECTED NOT DETECTED Final   Candida parapsilosis NOT DETECTED NOT DETECTED Final   Candida tropicalis NOT DETECTED NOT DETECTED Final   Cryptococcus neoformans/gattii NOT DETECTED NOT DETECTED Final   CTX-M ESBL NOT DETECTED NOT DETECTED Final   Carbapenem resistance IMP NOT DETECTED NOT DETECTED Final   Carbapenem  resistance KPC NOT DETECTED NOT DETECTED Final   Carbapenem resistance NDM NOT DETECTED NOT DETECTED Final   Carbapenem resist OXA 48 LIKE NOT DETECTED NOT DETECTED Final   Carbapenem resistance VIM NOT DETECTED NOT DETECTED Final    Comment: Performed at [facility], [address] [place], [place] [address]  MRSA Next Gen by PCR, Nasal     Status: None   Collection Time: 03/21/24  5:49 PM   Specimen: Nasal Mucosa; Nasal Swab  Result Value Ref Range Status   MRSA by PCR Next Gen NOT DETECTED NOT DETECTED Final    Comment: (NOTE) The GeneXpert MRSA Assay (FDA approved for NASAL specimens only), is one component of a comprehensive MRSA colonization surveillance program. It is not intended to diagnose MRSA infection nor to guide or monitor  treatment for MRSA infections. Test performance is not FDA approved in patients less than 71 years old. Performed at [facility], [address], [place], [place] [address]   Culture, blood (routine x 2)     Status: None (Preliminary result)   Collection Time: 03/25/24  8:08 PM   Specimen: BLOOD LEFT HAND  Result Value Ref Range Status   Specimen Description   Final    BLOOD LEFT HAND Performed at [facility], [address], [place], [place] [address]    Special Requests   Final    BOTTLES DRAWN AEROBIC AND ANAEROBIC Blood Culture adequate volume Performed at [facility], [address] [place], [place] [address]    Culture   Final    NO GROWTH 3 DAYS Performed at [facility], [address], [place], [place] [address]    Report Status PENDING  Incomplete  MRSA Next Gen by PCR, Nasal     Status: None   Collection Time: 03/26/24 12:31 AM   Specimen: Nasal Mucosa; Nasal Swab  Result Value Ref Range Status   MRSA by PCR Next Gen NOT DETECTED NOT DETECTED Final    Comment: (NOTE) The GeneXpert MRSA Assay (FDA approved for NASAL specimens only), is one component of a  comprehensive MRSA colonization surveillance program. It is not intended to diagnose MRSA infection nor to guide or monitor treatment for MRSA infections. Test performance is not FDA approved in patients less than 80 years old. Performed at [facility], [address] [place], [place] [address]   Culture, blood (routine x 2)     Status: None (Preliminary result)   Collection Time: 03/26/24 12:57 AM   Specimen: BLOOD RIGHT ARM  Result Value Ref Range Status   Specimen Description   Final    BLOOD RIGHT ARM Performed at [facility], [address], [place], [place] [address]    Special Requests   Final    Blood Culture results may not be optimal due to an inadequate volume of blood received in culture bottles BOTTLES DRAWN AEROBIC AND ANAEROBIC Performed at [facility], [address] [place], [place] [address]    Culture   Final    NO GROWTH 3 DAYS Performed at [facility], [address] [place], [place] [address]    Report Status PENDING  Incomplete    Procedures/Studies: CT ABDOMEN PELVIS W CONTRAST Result Date: 03/25/2024 EXAM: CT ABDOMEN AND PELVIS WITH CONTRAST 03/25/2024 09:55:59 PM TECHNIQUE: CT of the abdomen and pelvis was performed with the administration of 100 mL of iohexol  (OMNIPAQUE ) 350 MG/ML injection. Multiplanar reformatted images are provided for review. Automated exposure control, iterative reconstruction, and/or weight-based adjustment of the mA/kV was utilized to reduce the radiation dose to as low as reasonably achievable. COMPARISON: 04/01/2015 CLINICAL HISTORY: Abdominal pain, acute, nonlocalized. FINDINGS: LOWER CHEST: Basilar atelectasis. LIVER: The liver is unremarkable. GALLBLADDER AND BILE DUCTS: Gallbladder is unremarkable. No biliary ductal dilatation. SPLEEN: No acute abnormality. PANCREAS: No acute abnormality. ADRENAL GLANDS: Stable small nodules in the adrenal glands bilaterally, most compatible  with adenomas. KIDNEYS, URETERS AND BLADDER: 1 m. m nonobstructing stone in the upper pole of the left kidney. 8 mm nonobstructing stone in the lower pole of the left kidney. No hydronephrosis. The urinary bladder is obscured by beam hardening artifact from the left hip replacement. Small bilateral renal cysts appear benign. Per consensus, no follow-up is needed for simple  Bosniak type 1 and 2 renal cysts, unless the patient has a malignancy history or risk factors. No perinephric or periureteral stranding. GI AND BOWEL: Stomach demonstrates no acute abnormality. Sigmoid diverticulosis. Inflammatory stranding around the mid sigmoid colon compatible with active diverticulitis. There is no bowel obstruction. PERITONEUM AND RETROPERITONEUM: No ascites. No free air. VASCULATURE: Aorta is normal in caliber. Aortic atherosclerosis. LYMPH NODES: No lymphadenopathy. REPRODUCTIVE ORGANS: No acute abnormality. BONES AND SOFT TISSUES: Prior left hip replacement. Degenerative changes in the lumbar spine. No acute bony abnormality. No focal soft Ibarra abnormality. IMPRESSION: 1. Sigmoid diverticulosis with active diverticulitis in the mid sigmoid colon. 2. Nonobstructing left nephrolithiasis. Electronically signed by: Franky Crease MD 03/25/2024 10:24 PM EST RP Workstation: [id number]   CT Angio Chest PE W and/or Wo Contrast Result Date: 03/25/2024 EXAM: CTA of the Chest with contrast for PE 03/25/2024 09:55:59 PM TECHNIQUE: CTA of the chest was performed after the administration of 100 mL of iohexol  (OMNIPAQUE ) 350 MG/ML injection. Multiplanar reformatted images are provided for review. MIP images are provided for review. Automated exposure control, iterative reconstruction, and/or weight based adjustment of the mA/kV was utilized to reduce the radiation dose to as low as reasonably achievable. COMPARISON: 08/18/2021 CLINICAL HISTORY: Pulmonary embolism (PE) suspected, high prob. FINDINGS: PULMONARY ARTERIES: Pulmonary  arteries are adequately opacified for evaluation. No pulmonary embolism. Main pulmonary artery is normal in caliber. MEDIASTINUM: The heart and pericardium demonstrate no acute abnormality. Scattered coronary artery and aortic atherosclerosis. There is no acute abnormality of the thoracic aorta. LYMPH NODES: No mediastinal, hilar or axillary lymphadenopathy. LUNGS AND PLEURA: Areas of atelectasis in the mid and lower lungs. No focal consolidation or pulmonary edema. No pleural effusion or pneumothorax. UPPER ABDOMEN: Limited images of the upper abdomen are unremarkable. SOFT TISSUES AND BONES: No acute bone or soft Ibarra abnormality. IMPRESSION: 1. No pulmonary embolism. 2. Areas of atelectasis in the mid and lower lungs. No effusions. Electronically signed by: Franky Crease MD 03/25/2024 10:19 PM EST RP Workstation: [id number]   ECHOCARDIOGRAM COMPLETE Result Date: 03/22/2024    ECHOCARDIOGRAM REPORT   Patient Name:   Lisa Ibarra [facility] Date of Exam: 03/22/2024 Medical Rec #:  [id number]     Height:       69.0 in Accession #:    [id number]    Weight:       286.8 lb Date of Birth:  Oct 24, 1960     BSA:          2.408 m Patient Age:    63 years      BP:           144/76 mmHg Patient Gender: F             HR:           84 bpm. Exam Location:  Inpatient Procedure: 2D Echo, Color Doppler and Cardiac Doppler (Both Spectral and Color            Flow Doppler were utilized during procedure). Indications:    CHF I50.31  History:        Patient has no prior history of Echocardiogram examinations.                 Risk Factors:Diabetes and Hypertension.  Sonographer:    Tinnie Gosling RDCS Referring Phys: [id number] WHITNEY D HARRIS IMPRESSIONS  1. This was a very limited study due to poor acoustic windows and lack of echo contrast. In limited views, the LV systolic function appears preserved without focal wall  motion abnormalities.  2. Left ventricular ejection fraction, by estimation, is 55 to 60%. The left ventricle has normal  function. The left ventricle has no regional wall motion abnormalities. Not well visualized left ventricular hypertrophy. Left ventricular diastolic parameters are consistent with Grade I diastolic dysfunction (impaired relaxation).  3. Right ventricular systolic function is normal. The right ventricular size is not well visualized. Tricuspid regurgitation signal is inadequate for assessing PA pressure.  4. The mitral valve is normal in structure. No evidence of mitral valve regurgitation. No evidence of mitral stenosis.  5. The aortic valve was not well visualized. Aortic valve regurgitation is not visualized. No aortic stenosis is present.  6. The inferior vena cava is dilated in size with <50% respiratory variability, suggesting right atrial pressure of 15 mmHg. Comparison(s): No prior Echocardiogram. Conclusion(s)/Recommendation(s): Very limited study due to poor acoustic windows, but the LV systolic function appears normal. The RV was poorly visualized. Recommend repeat echocardiography with the use of echo contrast if better assessment of the RV function is necessary. FINDINGS  Left Ventricle: Left ventricular ejection fraction, by estimation, is 55 to 60%. The left ventricle has normal function. The left ventricle has no regional wall motion abnormalities. The left ventricular internal cavity size was normal in size. Not well  visualized left ventricular hypertrophy. Left ventricular diastolic parameters are consistent with Grade I diastolic dysfunction (impaired relaxation). Right Ventricle: The right ventricular size is not well visualized. Right vetricular wall thickness was not well visualized. Right ventricular systolic function is normal. Tricuspid regurgitation signal is inadequate for assessing PA pressure. Left Atrium: Left atrial size was normal in size. Right Atrium: Right atrial size was normal in size. Pericardium: There is no evidence of pericardial effusion. Mitral Valve: The mitral valve is  normal in structure. No evidence of mitral valve regurgitation. No evidence of mitral valve stenosis. Tricuspid Valve: The tricuspid valve is not well visualized. Tricuspid valve regurgitation is not demonstrated. No evidence of tricuspid stenosis. Aortic Valve: The aortic valve was not well visualized. Aortic valve regurgitation is not visualized. No aortic stenosis is present. Pulmonic Valve: The pulmonic valve was not well visualized. Pulmonic valve regurgitation is not visualized. No evidence of pulmonic stenosis. Aorta: The aortic root and ascending aorta are structurally normal, with no evidence of dilitation. Venous: The inferior vena cava is dilated in size with less than 50% respiratory variability, suggesting right atrial pressure of 15 mmHg. IAS/Shunts: The interatrial septum was not well visualized.  LEFT VENTRICLE PLAX 2D LVIDd:         5.00 cm   Diastology LVIDs:         3.80 cm   LV e' medial:    5.44 cm/s LV PW:         1.40 cm   LV E/e' medial:  17.4 LV IVS:        1.40 cm   LV e' lateral:   7.51 cm/s LVOT diam:     2.10 cm   LV E/e' lateral: 12.6 LV SV:         93 LV SV Index:   39 LVOT Area:     3.46 cm LV IVRT:       108 msec  RIGHT VENTRICLE             IVC RV S prime:     11.40 cm/s  IVC diam: 2.70 cm TAPSE (M-mode): 2.5 cm  PULMONARY VEINS                             Diastolic Velocity: 27.50 cm/s                             S/D Velocity:       1.00                             Systolic Velocity:  27.90 cm/s LEFT ATRIUM             Index        RIGHT ATRIUM           Index LA diam:        3.90 cm 1.62 cm/m   RA Area:     17.00 cm LA Vol (A2C):   32.1 ml 13.33 ml/m  RA Volume:   46.10 ml  19.14 ml/m LA Vol (A4C):   44.1 ml 18.31 ml/m LA Biplane Vol: 38.0 ml 15.78 ml/m  AORTIC VALVE LVOT Vmax:   125.00 cm/s LVOT Vmean:  99.800 cm/s LVOT VTI:    0.269 m  AORTA Ao Root diam: 3.30 cm Ao Asc diam:  3.30 cm MITRAL VALVE MV Area (PHT): 2.54 cm     SHUNTS MV Decel  Time: 299 msec     Systemic VTI:  0.27 m MV E velocity: 94.70 cm/s   Systemic Diam: 2.10 cm MV A velocity: 114.00 cm/s MV E/A ratio:  0.83 Georganna Archer Electronically signed by Georganna Archer Signature Date/Time: 03/22/2024/12:54:44 PM    Final    DG Chest Port 1 View Result Date: 03/21/2024 CLINICAL DATA:  Possible sepsis.  Fever, cough and body aches. EXAM: PORTABLE CHEST 1 VIEW COMPARISON:  07/25/2022 FINDINGS: Lungs are somewhat hypoinflated without focal lobar consolidation or effusion. Subtle hazy prominence of the central pulmonary vessels likely due to mild vascular congestion. Cardiomediastinal silhouette and remainder of the exam is unchanged. IMPRESSION: Suggestion of mild vascular congestion. Electronically Signed   By: Toribio Agreste M.D.   On: 03/21/2024 13:48     Time coordinating discharge: Over 30 minutes    Alm Apo, MD  [facility] 03/29/2024, 2:28 PM

## 2024-03-29 NOTE — Progress Notes (Signed)
 Outpatient pharmacy meds delivered to room, with letter for work and AVS summary.  Patient refused staff assistance with getting dressed, but stated her daughter would help when she got to the hospital.

## 2024-03-29 NOTE — Plan of Care (Signed)
  Problem: Education: Goal: Knowledge of General Education information will improve Description: Including pain rating scale, medication(s)/side effects and non-pharmacologic comfort measures Outcome: Progressing   Problem: Clinical Measurements: Goal: Ability to maintain clinical measurements within normal limits will improve Outcome: Progressing Goal: Will remain free from infection Outcome: Progressing Goal: Diagnostic test results will improve Outcome: Progressing Goal: Respiratory complications will improve Outcome: Progressing   Problem: Activity: Goal: Risk for activity intolerance will decrease Outcome: Progressing   Problem: Pain Managment: Goal: General experience of comfort will improve and/or be controlled Outcome: Progressing   Problem: Fluid Volume: Goal: Ability to maintain a balanced intake and output will improve Outcome: Progressing   Problem: Metabolic: Goal: Ability to maintain appropriate glucose levels will improve Outcome: Progressing

## 2024-03-31 LAB — CULTURE, BLOOD (ROUTINE X 2): Culture: NO GROWTH

## 2024-04-06 LAB — CULTURE, BLOOD (ROUTINE X 2): Special Requests: ADEQUATE

## 2024-04-10 ENCOUNTER — Emergency Department (HOSPITAL_BASED_OUTPATIENT_CLINIC_OR_DEPARTMENT_OTHER)

## 2024-04-10 ENCOUNTER — Inpatient Hospital Stay (HOSPITAL_BASED_OUTPATIENT_CLINIC_OR_DEPARTMENT_OTHER)
Admission: EM | Admit: 2024-04-10 | Discharge: 2024-04-17 | DRG: 391 | Disposition: A | Discharge status: Home / Self Care | Attending: Internal Medicine | Admitting: Internal Medicine

## 2024-04-10 ENCOUNTER — Other Ambulatory Visit: Payer: Self-pay

## 2024-04-10 ENCOUNTER — Emergency Department (HOSPITAL_COMMUNITY)
Admission: EM | Admit: 2024-04-10 | Discharge: 2024-04-10 | Discharge status: Against Medical Advice | Source: Home / Self Care

## 2024-04-10 ENCOUNTER — Encounter (HOSPITAL_BASED_OUTPATIENT_CLINIC_OR_DEPARTMENT_OTHER): Payer: Self-pay | Admitting: Emergency Medicine

## 2024-04-10 DIAGNOSIS — E1165 Type 2 diabetes mellitus with hyperglycemia: Secondary | ICD-10-CM | POA: Diagnosis present

## 2024-04-10 DIAGNOSIS — F17210 Nicotine dependence, cigarettes, uncomplicated: Secondary | ICD-10-CM | POA: Diagnosis present

## 2024-04-10 DIAGNOSIS — Z7985 Long-term (current) use of injectable non-insulin antidiabetic drugs: Secondary | ICD-10-CM

## 2024-04-10 DIAGNOSIS — Z888 Allergy status to other drugs, medicaments and biological substances status: Secondary | ICD-10-CM

## 2024-04-10 DIAGNOSIS — Z79899 Other long term (current) drug therapy: Secondary | ICD-10-CM

## 2024-04-10 DIAGNOSIS — Z860100 Personal history of colon polyps, unspecified: Secondary | ICD-10-CM

## 2024-04-10 DIAGNOSIS — Z5321 Procedure and treatment not carried out due to patient leaving prior to being seen by health care provider: Secondary | ICD-10-CM | POA: Insufficient documentation

## 2024-04-10 DIAGNOSIS — Z7951 Long term (current) use of inhaled steroids: Secondary | ICD-10-CM

## 2024-04-10 DIAGNOSIS — R109 Unspecified abdominal pain: Secondary | ICD-10-CM | POA: Insufficient documentation

## 2024-04-10 DIAGNOSIS — Z823 Family history of stroke: Secondary | ICD-10-CM

## 2024-04-10 DIAGNOSIS — Z91030 Bee allergy status: Secondary | ICD-10-CM

## 2024-04-10 DIAGNOSIS — Z794 Long term (current) use of insulin: Secondary | ICD-10-CM

## 2024-04-10 DIAGNOSIS — E876 Hypokalemia: Secondary | ICD-10-CM | POA: Diagnosis present

## 2024-04-10 DIAGNOSIS — Z886 Allergy status to analgesic agent status: Secondary | ICD-10-CM

## 2024-04-10 DIAGNOSIS — I5032 Chronic diastolic (congestive) heart failure: Secondary | ICD-10-CM | POA: Diagnosis present

## 2024-04-10 DIAGNOSIS — E871 Hypo-osmolality and hyponatremia: Secondary | ICD-10-CM | POA: Diagnosis present

## 2024-04-10 DIAGNOSIS — I81 Portal vein thrombosis: Secondary | ICD-10-CM | POA: Diagnosis present

## 2024-04-10 DIAGNOSIS — K5720 Diverticulitis of large intestine with perforation and abscess without bleeding: Principal | ICD-10-CM | POA: Diagnosis present

## 2024-04-10 DIAGNOSIS — I110 Hypertensive heart disease with heart failure: Secondary | ICD-10-CM | POA: Diagnosis present

## 2024-04-10 DIAGNOSIS — E66813 Obesity, class 3: Secondary | ICD-10-CM | POA: Diagnosis present

## 2024-04-10 DIAGNOSIS — K5792 Diverticulitis of intestine, part unspecified, without perforation or abscess without bleeding: Secondary | ICD-10-CM | POA: Diagnosis present

## 2024-04-10 DIAGNOSIS — Z7901 Long term (current) use of anticoagulants: Secondary | ICD-10-CM

## 2024-04-10 DIAGNOSIS — J4489 Other specified chronic obstructive pulmonary disease: Secondary | ICD-10-CM | POA: Diagnosis present

## 2024-04-10 DIAGNOSIS — Z801 Family history of malignant neoplasm of trachea, bronchus and lung: Secondary | ICD-10-CM

## 2024-04-10 DIAGNOSIS — Z833 Family history of diabetes mellitus: Secondary | ICD-10-CM

## 2024-04-10 DIAGNOSIS — G894 Chronic pain syndrome: Secondary | ICD-10-CM | POA: Diagnosis present

## 2024-04-10 DIAGNOSIS — Z6841 Body Mass Index (BMI) 40.0 and over, adult: Secondary | ICD-10-CM

## 2024-04-10 LAB — CBC
HCT: 47.1 % — ABNORMAL HIGH (ref 36.0–46.0)
Hemoglobin: 15.7 g/dL — ABNORMAL HIGH (ref 12.0–15.0)
MCH: 31.7 pg (ref 26.0–34.0)
MCHC: 33.3 g/dL (ref 30.0–36.0)
MCV: 95.2 fL (ref 80.0–100.0)
Platelets: 157 K/uL (ref 150–400)
RBC: 4.95 MIL/uL (ref 3.87–5.11)
RDW: 13.3 % (ref 11.5–15.5)
WBC: 7 K/uL (ref 4.0–10.5)
nRBC: 0 % (ref 0.0–0.2)

## 2024-04-10 LAB — LIPASE, BLOOD: Lipase: 28 U/L (ref 11–51)

## 2024-04-10 LAB — BASIC METABOLIC PANEL WITH GFR
Anion gap: 12 (ref 5–15)
BUN: 5 mg/dL — ABNORMAL LOW (ref 8–23)
CO2: 25 mmol/L (ref 22–32)
Calcium: 9.2 mg/dL (ref 8.9–10.3)
Chloride: 100 mmol/L (ref 98–111)
Creatinine, Ser: 0.64 mg/dL (ref 0.44–1.00)
GFR, Estimated: >60 mL/min (ref >60)
Glucose, Bld: 279 mg/dL — ABNORMAL HIGH (ref 70–99)
Potassium: 3.6 mmol/L (ref 3.5–5.1)
Sodium: 136 mmol/L (ref 135–145)

## 2024-04-10 LAB — HEPATIC FUNCTION PANEL
ALT: <5 U/L (ref 0–44)
AST: 11 U/L — ABNORMAL LOW (ref 15–41)
Albumin: 3.3 g/dL — ABNORMAL LOW (ref 3.5–5.0)
Alkaline Phosphatase: 87 U/L (ref 38–126)
Bilirubin, Direct: 0.3 mg/dL — ABNORMAL HIGH (ref 0.0–0.2)
Indirect Bilirubin: 0.3 mg/dL (ref 0.3–0.9)
Total Bilirubin: 0.7 mg/dL (ref 0.0–1.2)
Total Protein: 7.5 g/dL (ref 6.5–8.1)

## 2024-04-10 LAB — CBG MONITORING, ED: Glucose-Capillary: 219 mg/dL — ABNORMAL HIGH (ref 70–99)

## 2024-04-10 LAB — TROPONIN T, HIGH SENSITIVITY: Troponin T High Sensitivity: <15 ng/L (ref 0–19)

## 2024-04-10 MED ORDER — PIPERACILLIN-TAZOBACTAM 3.375 G IVPB
3.3750 g | Freq: Three times a day (TID) | INTRAVENOUS | Status: DC
  Administered 2024-04-10 – 2024-04-17 (×19): 3.375 g via INTRAVENOUS
  Filled 2024-04-10 (×20): qty 50

## 2024-04-10 MED ORDER — IOHEXOL 300 MG/ML  SOLN
125.0000 mL | Freq: Once | INTRAMUSCULAR | Status: AC | PRN
  Administered 2024-04-10: 125 mL via INTRAVENOUS

## 2024-04-10 MED ORDER — INSULIN ASPART 100 UNIT/ML IJ SOLN
0.0000 [IU] | Freq: Three times a day (TID) | INTRAMUSCULAR | Status: DC
  Administered 2024-04-10: 2 [IU] via SUBCUTANEOUS
  Filled 2024-04-10: qty 2

## 2024-04-10 MED ORDER — MORPHINE SULFATE (PF) 4 MG/ML IV SOLN
6.0000 mg | Freq: Once | INTRAVENOUS | Status: AC
  Administered 2024-04-10: 6 mg via INTRAVENOUS
  Filled 2024-04-10: qty 2

## 2024-04-10 MED ORDER — SODIUM CHLORIDE 0.9 % IV SOLN
1.0000 g | Freq: Once | INTRAVENOUS | Status: AC
  Administered 2024-04-10: 1 g via INTRAVENOUS
  Filled 2024-04-10: qty 10

## 2024-04-10 MED ORDER — SODIUM CHLORIDE 0.9 % IV BOLUS
500.0000 mL | Freq: Once | INTRAVENOUS | Status: AC
  Administered 2024-04-10: 500 mL via INTRAVENOUS

## 2024-04-10 MED ORDER — METRONIDAZOLE 500 MG/100ML IV SOLN
500.0000 mg | Freq: Once | INTRAVENOUS | Status: AC
  Administered 2024-04-10: 500 mg via INTRAVENOUS
  Filled 2024-04-10: qty 100

## 2024-04-10 MED ORDER — ONDANSETRON HCL 4 MG/2ML IJ SOLN
4.0000 mg | Freq: Once | INTRAMUSCULAR | Status: AC
  Administered 2024-04-10: 4 mg via INTRAVENOUS
  Filled 2024-04-10: qty 2

## 2024-04-10 NOTE — ED Provider Notes (Signed)
 [facility] Provider Note   CSN: [id number] Arrival date & time: 04/10/24  1458     Patient presents with: Chest Pain   Lisa Ibarra is a 63 y.o. female.   Patient is a 63 year old female with a history of diabetes, chronic pain, hypertension, COPD, CHF.  She had a recent admission from October 30 to November 2 for a COPD exacerbation.  At that time she was febrile and her blood cultures grew out E. coli and Enterobacter.  She was readmitted on November 11 through November 7 for diverticulitis and sepsis.  She has been home and her daughter states that she ran out of her pain medicine and has been having worsening pain because of that and they need some medication to last them until Friday when she has an appointment with her primary care doctor.  Patient says that overall she feels like she is doing worse.  She is having ongoing abdominal pain and intermittent nausea and vomiting.  She is having loose stools.  She says her Tmax at home has been 99.  She says the abdominal pain is making her chest hurt.  It is pain in the center of her chest.  She has intermittent shortness of breath which she thinks is related to her worsening abdominal pain.  She is currently taking Goody powders for the pain.  She denies any urinary symptoms.       Prior to Admission medications   Medication Sig Start Date End Date Taking? Authorizing Provider  albuterol  (VENTOLIN  HFA) 108 (90 Base) MCG/ACT inhaler Inhale 2 puffs into the lungs every 6 (six) hours as needed for wheezing or shortness of breath. 08/22/21   Jerri Keys, MD  arformoterol  (BROVANA ) 15 MCG/2ML NEBU Take 2 mLs (15 mcg total) by nebulization 2 (two) times daily. Patient not taking: Reported on 03/27/2024 03/24/24   Lue Elsie BROCKS, MD  Aspirin-Acetaminophen -Caffeine  (GOODYS EXTRA STRENGTH) 500-325-65 MG PACK Take 1 packet by mouth See admin instructions. Dissolve 1 packet in the mouth up to six times  a day as needed for pain    [provider]  atorvastatin  (LIPITOR) 20 MG tablet Take 20 mg by mouth daily. Patient not taking: Reported on 03/27/2024    [provider]  blood glucose meter kit and supplies KIT Dispense based on patient and insurance preference. Use up to four times daily as directed. 08/22/21   Jerri Keys, MD  budesonide  (PULMICORT ) 0.5 MG/2ML nebulizer solution Take 2 mLs (0.5 mg total) by nebulization 2 (two) times daily. Patient not taking: Reported on 03/27/2024 03/24/24   Lue Elsie BROCKS, MD  furosemide  (LASIX ) 20 MG tablet Take 20-40 mg by mouth daily as needed for fluid or edema.    [provider]  Insulin  Pen Needle 29G X MISC For insulin  injection 08/22/21   Jerri Keys, MD  losartan  (COZAAR ) 25 MG tablet Take 1 tablet (25 mg total) by mouth daily. 03/24/24   Lue Elsie BROCKS, MD  metoprolol  succinate (TOPROL -XL) 25 MG 24 hr tablet Take 0.5 tablets (12.5 mg total) by mouth at bedtime. 03/24/24   Lue Elsie BROCKS, MD  oxyCODONE  (OXY IR/ROXICODONE ) 5 MG immediate release tablet Take 1 tablet (5 mg total) by mouth every 4 (four) hours as needed for moderate pain (pain score 4-6), severe pain (pain score 7-10) or breakthrough pain. 03/29/24   Patsy Lenis, MD  OZEMPIC, 0.25 OR 0.5 MG/DOSE, 2 MG/3ML SOPN Inject 0.25 mg into the skin  every Sunday.    [provider]  polyethylene glycol (MIRALAX / GLYCOLAX) 17 g packet Take 17 g by mouth daily. 03/29/24   Patsy Lenis, MD  potassium chloride  SA (KLOR-CON  M) 20 MEQ tablet Take 20 mEq by mouth daily.    [provider]  revefenacin (YUPELRI) 175 MCG/3ML nebulizer solution Take 3 mLs (175 mcg total) by nebulization daily. Patient not taking: Reported on 03/27/2024 03/25/24   Lue Elsie BROCKS, MD  senna-docusate (SENOKOT-S) 8.6-50 MG tablet Take 1 tablet by mouth at bedtime as needed for mild constipation. 03/29/24   Patsy Lenis, MD    Allergies: Bee venom, Empagliflozin,  Ibuprofen, and Statins    Review of Systems  Constitutional:  Positive for fatigue. Negative for chills, diaphoresis and fever.  HENT:  Negative for congestion, rhinorrhea and sneezing.   Eyes: Negative.   Respiratory:  Positive for shortness of breath. Negative for cough and chest tightness.   Cardiovascular:  Positive for chest pain. Negative for leg swelling.  Gastrointestinal:  Positive for abdominal pain, diarrhea, nausea and vomiting. Negative for blood in stool.  Genitourinary:  Negative for difficulty urinating, flank pain and frequency.  Musculoskeletal:  Negative for arthralgias and back pain.  Skin:  Negative for rash.  Neurological:  Negative for dizziness, speech difficulty, weakness, numbness and headaches.    Updated Vital Signs BP (!) 158/87 (BP Location: Left Arm)   Pulse 88   Temp 98.1 F (36.7 C) (Oral)   Resp 16   LMP 06/12/2011   SpO2 90%   Physical Exam Constitutional:      Appearance: She is well-developed. She is obese.  HENT:     Head: Normocephalic and atraumatic.  Eyes:     Pupils: Pupils are equal, round, and reactive to light.  Cardiovascular:     Rate and Rhythm: Normal rate and regular rhythm.     Heart sounds: Normal heart sounds.  Pulmonary:     Effort: Pulmonary effort is normal. No respiratory distress.     Breath sounds: Wheezing (Scarce wheezes in the lung bilaterally, no increased work of breathing, good air movement) present. No rales.  Chest:     Chest wall: No tenderness.  Abdominal:     General: Bowel sounds are normal.     Palpations: Abdomen is soft.     Tenderness: There is abdominal tenderness (Generalized abdominal tenderness). There is no guarding or rebound.  Musculoskeletal:        General: Normal range of motion.     Cervical back: Normal range of motion and neck supple.  Lymphadenopathy:     Cervical: No cervical adenopathy.  Skin:    General: Skin is warm and dry.     Findings: No rash.  Neurological:     Mental  Status: She is alert and oriented to person, place, and time.     (all labs ordered are listed, but only abnormal results are displayed) Labs Reviewed  BASIC METABOLIC PANEL WITH GFR - Abnormal; Notable for the following components:      Result Value   Glucose, Bld 279 (*)    BUN 5 (*)    All other components within normal limits  CBC - Abnormal; Notable for the following components:   Hemoglobin 15.7 (*)    HCT 47.1 (*)    All other components within normal limits  HEPATIC FUNCTION PANEL - Abnormal; Notable for the following components:   Albumin 3.3 (*)    AST 11 (*)    Bilirubin,  Direct 0.3 (*)    All other components within normal limits  CULTURE, BLOOD (ROUTINE X 2)  CULTURE, BLOOD (ROUTINE X 2)  LIPASE, BLOOD  TROPONIN T, HIGH SENSITIVITY    EKG: EKG Interpretation Date/Time:  Wednesday April 10 2024 15:15:12 EST Ventricular Rate:  81 PR Interval:  182 QRS Duration:  93 QT Interval:  389 QTC Calculation: 452 R Axis:   -35  Text Interpretation: Sinus rhythm Left axis deviation since last tracing no significant change Confirmed by Lenor Hollering [phone]) on 04/10/2024 3:21:27 PM Also confirmed by Lenor Hollering [phone])  on 04/10/2024 4:07:06 PM  Radiology: CT ABDOMEN PELVIS W CONTRAST Result Date: 04/10/2024 EXAM: CT ABDOMEN AND PELVIS WITH CONTRAST 04/10/2024 05:13:38 PM TECHNIQUE: CT of the abdomen and pelvis was performed with the administration of 125 mL of iohexol  (OMNIPAQUE ) 300 MG/ML solution. Multiplanar reformatted images are provided for review. Automated exposure control, iterative reconstruction, and/or weight-based adjustment of the mA/kV was utilized to reduce the radiation dose to as low as reasonably achievable. COMPARISON: CT abdomen and pelvis 03/25/2024. CLINICAL HISTORY: Diverticulitis, complication suspected. FINDINGS: LOWER CHEST: No acute abnormality. LIVER: The liver is unremarkable. GALLBLADDER AND BILE DUCTS: Gallbladder is unremarkable. No  biliary ductal dilatation. SPLEEN: No acute abnormality. PANCREAS: No acute abnormality. ADRENAL GLANDS: No acute abnormality. KIDNEYS, URETERS AND BLADDER: No stones in the kidneys or ureters. No hydronephrosis. No perinephric or periureteral stranding. Urinary bladder is unremarkable. GI AND BOWEL: Persistent mid distal sigmoid diverticulitis with mild bowel thickening and pericolonic fat stranding superiorly. Microperforation is noted and best visualized on coronal images (8:111). There is no bowel obstruction. PERITONEUM AND RETROPERITONEUM: Interval development of a fluid and gas collection within the mid abdomen superior to the mid sigmoid colon (8:11). No free air. VASCULATURE: Aorta is normal in caliber. LYMPH NODES: No lymphadenopathy. REPRODUCTIVE ORGANS: No acute abnormality. BONES AND SOFT TISSUES: No acute osseous abnormality. No focal soft tissue abnormality. Intervertebral disc space vacuum phenomenon. Grade 1 anterolisthesis of L4 on L5. Grade 1 anterolisthesis of L3 on L4. To a superior endplate concavity likely old healed fracture. IMPRESSION: 1. Acute complicated diverticulitis of the mid-distal sigmoid colon with microperforation and associated 5.4 x 6.1 x 9.2cm gas and fluid collection formation. Recommend colonoscopy status post treatment and status post complete resolution of inflammatory changes to exclude an underlying lesion. Electronically signed by: Morgane Naveau MD 04/10/2024 05:45 PM EST RP Workstation: [id number]   DG Chest Portable 1 View Result Date: 04/10/2024 EXAM: 1 VIEW(S) XRAY OF THE CHEST 04/10/2024 03:30:00 PM COMPARISON: Comparison 03/22/2024. Stable cardiomediastinal silhouette. CLINICAL HISTORY: cp cp FINDINGS: LUNGS AND PLEURA: No focal pulmonary opacity. No pleural effusion. No pneumothorax. HEART AND MEDIASTINUM: Stable cardiomediastinal silhouette. BONES AND SOFT TISSUES: No acute osseous abnormality. IMPRESSION: 1. No acute cardiopulmonary disease. Electronically  signed by: Lynwood Seip MD 04/10/2024 03:39 PM EST RP Workstation: [id number]     Procedures   Medications Ordered in the ED  metroNIDAZOLE (FLAGYL) IVPB 500 mg (500 mg Intravenous New Bag/Given 04/10/24 1903)  piperacillin-tazobactam (ZOSYN) IVPB 3.375 g (has no administration in time range)  insulin  aspart (novoLOG ) injection 0-6 Units (has no administration in time range)  morphine  (PF) 4 MG/ML injection 6 mg (6 mg Intravenous Given 04/10/24 1600)  ondansetron  (ZOFRAN ) injection 4 mg (4 mg Intravenous Given 04/10/24 1558)  sodium chloride  0.9 % bolus 500 mL (0 mLs Intravenous Stopped 04/10/24 1725)  iohexol  (OMNIPAQUE ) 300 MG/ML solution 125 mL (125 mLs Intravenous Contrast Given 04/10/24 1651)  cefTRIAXone (ROCEPHIN) 1 g  in sodium chloride  0.9 % 100 mL IVPB (0 g Intravenous Stopped 04/10/24 1903)  morphine  (PF) 4 MG/ML injection 6 mg (6 mg Intravenous Given 04/10/24 1820)                                    Medical Decision Making Amount and/or Complexity of Data Reviewed Labs: ordered. Radiology: ordered.  Risk Prescription drug management. Decision regarding hospitalization.   This patient presents to the ED for concern of abdominal pain, this involves an extensive number of treatment options, and is a complaint that carries with it a high risk of complications and morbidity.  I considered the following differential and admission for this acute, potentially life threatening condition.  The differential diagnosis includes diverticular abscess, perforation, abdominal cramping, colitis, sepsis, bowel obstruction, C. difficile  MDM:    Patient is a 63 year old who presents with ongoing abdominal pain and vomiting with loose stools after recent mission for diverticulitis and sepsis.  On her admission prior to that, she had blood cultures that were positive for Enterobacter and E. coli.  Looks like there was also a positivity for Pseudomonas.  She initially was given Rocephin and  Flagyl here in the ED.  Her WBC count is normal, she is not febrile.  CT scan shows microperforation and associated abscess.  Discussed with Dr. Rubin with general surgery who advises that they will consult on the patient.  Discussed with Dr. Arthea who has accepted the patient for admission.  Given the Pseudomonas positivity in her prior blood cultures, will add Zosyn.  Discussed with the ED pharmacist.  CRITICAL CARE Performed by: Andrea Ness Total critical care time: 60 minutes Critical care time was exclusive of separately billable procedures and treating other patients. Critical care was necessary to treat or prevent imminent or life-threatening deterioration. Critical care was time spent personally by me on the following activities: development of treatment plan with patient and/or surrogate as well as nursing, discussions with consultants, evaluation of patient's response to treatment, examination of patient, obtaining history from patient or surrogate, ordering and performing treatments and interventions, ordering and review of laboratory studies, ordering and review of radiographic studies, pulse oximetry and re-evaluation of patient's condition.   (Labs, imaging, consults)  Labs: I Ordered, and personally interpreted labs.  The pertinent results include: Normal WBC count  Imaging Studies ordered: I ordered imaging studies including CT abdomen pelvis I independently visualized and interpreted imaging. I agree with the radiologist interpretation  Additional history obtained from daughter at bedside.  External records from outside source obtained and reviewed including history  Cardiac Monitoring: The patient was maintained on a cardiac monitor.  If on the cardiac monitor, I personally viewed and interpreted the cardiac monitored which showed an underlying rhythm of: Sinus rhythm  Reevaluation: After the interventions noted above, I reevaluated the patient and found that they  have :improved  Social Determinants of Health:    Disposition: Admit to hospital  Co morbidities that complicate the patient evaluation  Past Medical History:  Diagnosis Date   Asthma    Cancer (HCC)    Cannabis abuse    Chronic pain    Diabetes mellitus type 2, controlled (HCC)    Hypertension      Medicines Meds ordered this encounter  Medications   morphine  (PF) 4 MG/ML injection 6 mg   ondansetron  (ZOFRAN ) injection 4 mg   sodium chloride  0.9 % bolus 500  mL   iohexol  (OMNIPAQUE ) 300 MG/ML solution 125 mL   cefTRIAXone  (ROCEPHIN ) 1 g in sodium chloride  0.9 % 100 mL IVPB    Antibiotic Indication::   Intra-abdominal   metroNIDAZOLE  (FLAGYL ) IVPB 500 mg    Antibiotic Indication::   Intra-abdominal Infection   morphine  (PF) 4 MG/ML injection 6 mg   piperacillin -tazobactam (ZOSYN ) IVPB 3.375 g    Antibiotic Indication::   Intra-abdominal Infection   insulin  aspart (novoLOG ) injection 0-6 Units    Correction coverage::   Very Sensitive (ESRD/Dialysis)    CBG < 70::   Implement Hypoglycemia Standing Orders and refer to Hypoglycemia Standing Orders sidebar report    CBG 70 - 120::   0 units    CBG 121 - 150::   0 units    CBG 151 - 200::   1 unit    CBG 201-250::   2 units    CBG 251-300::   3 units    CBG 301-350::   4 units    CBG 351-400::   5 units    CBG > 400:   Give 6 units and call MD    I have reviewed the patients home medicines and have made adjustments as needed  Problem List / ED Course: Problem List Items Addressed This Visit   None            Final diagnoses:  None    ED Discharge Orders     None          Lenor Hollering, MD 04/10/24 1945

## 2024-04-10 NOTE — ED Triage Notes (Signed)
 Pt presents with cp and shob.  Reports recent extended admission to [facility] ICU for diverticulitis and has not had much improvement in bowel symptoms since. Did try prune juice at home which since then has had diarrhea.  Reports the pain medication ran out on Sunday and since then has had progressive intermittent substernal non radiating cp and shob.

## 2024-04-10 NOTE — ED Triage Notes (Signed)
 BIBA from home- Pt has dx of Diverticulitis, has finished abx and pain meds, but still having abdominal pain

## 2024-04-11 ENCOUNTER — Encounter (HOSPITAL_COMMUNITY): Payer: Self-pay | Admitting: Internal Medicine

## 2024-04-11 DIAGNOSIS — K5720 Diverticulitis of large intestine with perforation and abscess without bleeding: Secondary | ICD-10-CM | POA: Diagnosis not present

## 2024-04-11 DIAGNOSIS — K5792 Diverticulitis of intestine, part unspecified, without perforation or abscess without bleeding: Secondary | ICD-10-CM | POA: Diagnosis not present

## 2024-04-11 LAB — COMPREHENSIVE METABOLIC PANEL WITH GFR
ALT: 5 U/L (ref 0–44)
AST: 11 U/L — ABNORMAL LOW (ref 15–41)
Albumin: 3 g/dL — ABNORMAL LOW (ref 3.5–5.0)
Alkaline Phosphatase: 81 U/L (ref 38–126)
Anion gap: 9 (ref 5–15)
BUN: <5 mg/dL — ABNORMAL LOW (ref 8–23)
CO2: 26 mmol/L (ref 22–32)
Calcium: 9.1 mg/dL (ref 8.9–10.3)
Chloride: 103 mmol/L (ref 98–111)
Creatinine, Ser: 0.72 mg/dL (ref 0.44–1.00)
GFR, Estimated: >60 mL/min (ref >60)
Glucose, Bld: 195 mg/dL — ABNORMAL HIGH (ref 70–99)
Potassium: 3.5 mmol/L (ref 3.5–5.1)
Sodium: 138 mmol/L (ref 135–145)
Total Bilirubin: 0.7 mg/dL (ref 0.0–1.2)
Total Protein: 7.3 g/dL (ref 6.5–8.1)

## 2024-04-11 LAB — CBC WITH DIFFERENTIAL/PLATELET
Abs Immature Granulocytes: 0.03 K/uL (ref 0.00–0.07)
Basophils Absolute: 0 K/uL (ref 0.0–0.1)
Basophils Relative: 0 %
Eosinophils Absolute: 0.1 K/uL (ref 0.0–0.5)
Eosinophils Relative: 2 %
HCT: 47.2 % — ABNORMAL HIGH (ref 36.0–46.0)
Hemoglobin: 15 g/dL (ref 12.0–15.0)
Immature Granulocytes: 0 %
Lymphocytes Relative: 15 %
Lymphs Abs: 1.1 K/uL (ref 0.7–4.0)
MCH: 31.4 pg (ref 26.0–34.0)
MCHC: 31.8 g/dL (ref 30.0–36.0)
MCV: 98.7 fL (ref 80.0–100.0)
Monocytes Absolute: 0.8 K/uL (ref 0.1–1.0)
Monocytes Relative: 11 %
Neutro Abs: 5.3 K/uL (ref 1.7–7.7)
Neutrophils Relative %: 72 %
Platelets: 144 K/uL — ABNORMAL LOW (ref 150–400)
RBC: 4.78 MIL/uL (ref 3.87–5.11)
RDW: 13.2 % (ref 11.5–15.5)
WBC: 7.3 K/uL (ref 4.0–10.5)
nRBC: 0 % (ref 0.0–0.2)

## 2024-04-11 LAB — GLUCOSE, CAPILLARY
Glucose-Capillary: 175 mg/dL — ABNORMAL HIGH (ref 70–99)
Glucose-Capillary: 187 mg/dL — ABNORMAL HIGH (ref 70–99)
Glucose-Capillary: 197 mg/dL — ABNORMAL HIGH (ref 70–99)
Glucose-Capillary: 214 mg/dL — ABNORMAL HIGH (ref 70–99)
Glucose-Capillary: 215 mg/dL — ABNORMAL HIGH (ref 70–99)
Glucose-Capillary: 238 mg/dL — ABNORMAL HIGH (ref 70–99)

## 2024-04-11 MED ORDER — ALBUTEROL SULFATE (2.5 MG/3ML) 0.083% IN NEBU: 2.5000 mg | INHALATION_SOLUTION | RESPIRATORY_TRACT | Status: DC | PRN

## 2024-04-11 MED ORDER — HYDROMORPHONE HCL 1 MG/ML IJ SOLN
0.5000 mg | INTRAMUSCULAR | Status: DC | PRN
  Administered 2024-04-11 – 2024-04-16 (×22): 1 mg via INTRAVENOUS
  Filled 2024-04-11 (×23): qty 1

## 2024-04-11 MED ORDER — METOPROLOL SUCCINATE ER 25 MG PO TB24
12.5000 mg | ORAL_TABLET | Freq: Every day | ORAL | Status: DC
  Administered 2024-04-11 – 2024-04-14 (×4): 12.5 mg via ORAL
  Filled 2024-04-11 (×5): qty 1

## 2024-04-11 MED ORDER — CARMEX CLASSIC LIP BALM EX OINT
TOPICAL_OINTMENT | CUTANEOUS | Status: DC | PRN
  Filled 2024-04-11: qty 10

## 2024-04-11 MED ORDER — INSULIN ASPART 100 UNIT/ML IJ SOLN
0.0000 [IU] | Freq: Three times a day (TID) | INTRAMUSCULAR | Status: DC
  Administered 2024-04-11: 3 [IU] via SUBCUTANEOUS
  Administered 2024-04-11: 5 [IU] via SUBCUTANEOUS
  Administered 2024-04-12 – 2024-04-13 (×4): 3 [IU] via SUBCUTANEOUS
  Administered 2024-04-13: 5 [IU] via SUBCUTANEOUS
  Administered 2024-04-13: 3 [IU] via SUBCUTANEOUS
  Administered 2024-04-14: 8 [IU] via SUBCUTANEOUS
  Administered 2024-04-14 – 2024-04-15 (×4): 3 [IU] via SUBCUTANEOUS
  Administered 2024-04-15: 5 [IU] via SUBCUTANEOUS
  Administered 2024-04-16 (×2): 3 [IU] via SUBCUTANEOUS
  Administered 2024-04-16: 5 [IU] via SUBCUTANEOUS
  Administered 2024-04-17: 3 [IU] via SUBCUTANEOUS
  Filled 2024-04-11: qty 2
  Filled 2024-04-11 (×2): qty 5
  Filled 2024-04-11 (×5): qty 3
  Filled 2024-04-11: qty 2
  Filled 2024-04-11 (×6): qty 3
  Filled 2024-04-11: qty 5
  Filled 2024-04-11: qty 3
  Filled 2024-04-11: qty 12

## 2024-04-11 MED ORDER — ATORVASTATIN CALCIUM 20 MG PO TABS
20.0000 mg | ORAL_TABLET | Freq: Every day | ORAL | Status: DC
  Administered 2024-04-11 – 2024-04-17 (×7): 20 mg via ORAL
  Filled 2024-04-11 (×7): qty 1

## 2024-04-11 MED ORDER — ONDANSETRON HCL 4 MG/2ML IJ SOLN
4.0000 mg | Freq: Four times a day (QID) | INTRAMUSCULAR | Status: DC | PRN
  Administered 2024-04-11: 4 mg via INTRAVENOUS
  Filled 2024-04-11: qty 2

## 2024-04-11 MED ORDER — INSULIN ASPART 100 UNIT/ML IJ SOLN
0.0000 [IU] | INTRAMUSCULAR | Status: DC
  Administered 2024-04-11: 1 [IU] via SUBCUTANEOUS
  Administered 2024-04-11 (×2): 2 [IU] via SUBCUTANEOUS
  Filled 2024-04-11 (×2): qty 2

## 2024-04-11 MED ORDER — OXYCODONE HCL 5 MG PO TABS
5.0000 mg | ORAL_TABLET | Freq: Four times a day (QID) | ORAL | Status: DC | PRN
  Administered 2024-04-11 – 2024-04-17 (×14): 5 mg via ORAL
  Filled 2024-04-11 (×16): qty 1

## 2024-04-11 MED ORDER — INSULIN ASPART 100 UNIT/ML IJ SOLN
0.0000 [IU] | Freq: Every day | INTRAMUSCULAR | Status: DC
  Administered 2024-04-13 – 2024-04-16 (×3): 2 [IU] via SUBCUTANEOUS
  Filled 2024-04-11 (×3): qty 2

## 2024-04-11 MED ORDER — HEPARIN SODIUM (PORCINE) 5000 UNIT/ML IJ SOLN
5000.0000 [IU] | Freq: Three times a day (TID) | INTRAMUSCULAR | Status: DC
  Administered 2024-04-11 – 2024-04-15 (×12): 5000 [IU] via SUBCUTANEOUS
  Filled 2024-04-11 (×13): qty 1

## 2024-04-11 MED ORDER — SODIUM CHLORIDE 0.9 % IV SOLN: INTRAVENOUS | Status: AC

## 2024-04-11 MED ORDER — LOSARTAN POTASSIUM 25 MG PO TABS
25.0000 mg | ORAL_TABLET | Freq: Every day | ORAL | Status: DC
  Administered 2024-04-11: 25 mg via ORAL
  Filled 2024-04-11: qty 1

## 2024-04-11 MED ORDER — BUDESONIDE 0.5 MG/2ML IN SUSP
0.5000 mg | Freq: Two times a day (BID) | RESPIRATORY_TRACT | Status: DC
  Administered 2024-04-11 – 2024-04-17 (×13): 0.5 mg via RESPIRATORY_TRACT
  Filled 2024-04-11 (×13): qty 2

## 2024-04-11 MED ORDER — ACETAMINOPHEN 325 MG PO TABS
650.0000 mg | ORAL_TABLET | Freq: Four times a day (QID) | ORAL | Status: DC | PRN
  Administered 2024-04-11 – 2024-04-17 (×5): 650 mg via ORAL
  Filled 2024-04-11 (×5): qty 2

## 2024-04-11 MED ORDER — ONDANSETRON HCL 4 MG PO TABS: 4.0000 mg | ORAL_TABLET | Freq: Four times a day (QID) | ORAL | Status: DC | PRN

## 2024-04-11 MED ORDER — SIMETHICONE 80 MG PO CHEW
80.0000 mg | CHEWABLE_TABLET | Freq: Four times a day (QID) | ORAL | Status: DC | PRN
  Administered 2024-04-11 – 2024-04-16 (×10): 80 mg via ORAL
  Filled 2024-04-11 (×11): qty 1

## 2024-04-11 MED ORDER — ARFORMOTEROL TARTRATE 15 MCG/2ML IN NEBU
15.0000 ug | INHALATION_SOLUTION | Freq: Two times a day (BID) | RESPIRATORY_TRACT | Status: DC
  Administered 2024-04-11 – 2024-04-17 (×13): 15 ug via RESPIRATORY_TRACT
  Filled 2024-04-11 (×13): qty 2

## 2024-04-11 MED ORDER — REVEFENACIN 175 MCG/3ML IN SOLN
175.0000 ug | Freq: Every day | RESPIRATORY_TRACT | Status: DC
  Administered 2024-04-11 – 2024-04-17 (×7): 175 ug via RESPIRATORY_TRACT
  Filled 2024-04-11 (×7): qty 3

## 2024-04-11 NOTE — Progress Notes (Signed)
 Patient showed me her left groin area and stated, this happened from my heating pad at home, I just wanted to let you all know that it didn't happen here. Skin to groin has an ecchymotic spot on it and some redness in the inner thigh area. Patients daughter sitting at the bedside. Plan of care ongoing.

## 2024-04-11 NOTE — Plan of Care (Signed)
  Problem: Coping: Goal: Ability to adjust to condition or change in health will improve Outcome: Progressing   Problem: Fluid Volume: Goal: Ability to maintain a balanced intake and output will improve Outcome: Progressing   Problem: Health Behavior/Discharge Planning: Goal: Ability to manage health-related needs will improve Outcome: Progressing

## 2024-04-11 NOTE — H&P (Signed)
 History and Physical    Lisa Ibarra FMW:[id number] DOB: [birth year] DOA: 04/10/2024  PCP: Cesario Mutton, MD  Patient coming from: home  I have personally briefly reviewed patient's old medical records in [facility]  Chief Complaint:   HPI: Lisa Ibarra is a 63 y.o. female with medical history significant of 63 year old female with a history of diabetes, chronic pain, hypertension, COPD, CHF pef, tobacco abuse, who has interim hx of admission 11-7 to 11-11  with diagnosis of diverticulitis with associated sepsis.Patient at home now with increase abdominal pain,  intermittent n/v.Lisa Ibarra Per patient feels like he symptoms are getting worse. She also note pain much worse after running out of discharge pain medication.She notes that abdominal pain radiates to her chest. She also noted breathing is difficult due to abdominal pain.  Patient however s/p treatment in ED and pain medication , now feels much improved. She also noted she is getting her appetite back and wants to Eat.  She notes she had not had any vomiting since yesterday.    ED Course:  Afeb bp 179/87, hr 76, rr 19, sat 96%   EKG NSR , LAD Cxr: NAD  Wbc 7.0, hgb 15.7, plt 157  Lipase 28  Na 136, K 3.6, Cl 100, glu 279cr 0.64   CT AB IMPRESSION: 1. Acute complicated diverticulitis of the mid-distal sigmoid colon with microperforation and associated 5.4 x 6.1 x 9.2cm gas and fluid collection formation. Recommend colonoscopy status post treatment and status post complete resolution of inflammatory changes to exclude an underlying lesion. Review of Systems: As per HPI otherwise 10 point review of systems negative.   Past Medical History:  Diagnosis Date   Asthma    Cancer (HCC)    Cannabis abuse    Chronic pain    Diabetes mellitus type 2, controlled (HCC)    Hypertension     Past Surgical History:  Procedure Laterality Date   CESAREAN SECTION     JOINT REPLACEMENT     Left eye     TUBAL LIGATION     TUMOR REMOVAL  from back       reports that she has been smoking cigarettes. She has never used smokeless tobacco. She reports current drug use. Drug: Marijuana. She reports that she does not drink alcohol.  Allergies  Allergen Reactions   Bee Venom Anaphylaxis and Hives   Empagliflozin Itching, Rash and Dermatitis   Ibuprofen Nausea And Vomiting   Statins Nausea Only and Other (See Comments)    Muscle spasms and cramps, too    Family History  Problem Relation Age of Onset   Stroke Mother    Lung cancer Father    Diabetes Mellitus II Sister     Prior to Admission medications   Medication Sig Start Date End Date Taking? Authorizing Provider  albuterol  (VENTOLIN  HFA) 108 (90 Base) MCG/ACT inhaler Inhale 2 puffs into the lungs every 6 (six) hours as needed for wheezing or shortness of breath. 08/22/21   Jerri Keys, MD  arformoterol (BROVANA) 15 MCG/2ML NEBU Take 2 mLs (15 mcg total) by nebulization 2 (two) times daily. Patient not taking: Reported on 03/27/2024 03/24/24   Lue Elsie BROCKS, MD  Aspirin-Acetaminophen -Caffeine  (GOODYS EXTRA STRENGTH) 500-325-65 MG PACK Take 1 packet by mouth See admin instructions. Dissolve 1 packet in the mouth up to six times a day as needed for pain    [provider]  atorvastatin (LIPITOR) 20 MG tablet Take 20 mg by mouth daily. Patient not  taking: Reported on 03/27/2024    [provider]  blood glucose meter kit and supplies KIT Dispense based on patient and insurance preference. Use up to four times daily as directed. 08/22/21   Jerri Keys, MD  budesonide  (PULMICORT ) 0.5 MG/2ML nebulizer solution Take 2 mLs (0.5 mg total) by nebulization 2 (two) times daily. Patient not taking: Reported on 03/27/2024 03/24/24   Lue Elsie BROCKS, MD  furosemide  (LASIX ) 20 MG tablet Take 20-40 mg by mouth daily as needed for fluid or edema.    [provider]  Insulin  Pen Needle 29G X MISC For insulin  injection 08/22/21   Jerri Keys, MD  losartan  (COZAAR ) 25  MG tablet Take 1 tablet (25 mg total) by mouth daily. 03/24/24   Lue Elsie BROCKS, MD  metoprolol  succinate (TOPROL -XL) 25 MG 24 hr tablet Take 0.5 tablets (12.5 mg total) by mouth at bedtime. 03/24/24   Lue Elsie BROCKS, MD  oxyCODONE  (OXY IR/ROXICODONE ) 5 MG immediate release tablet Take 1 tablet (5 mg total) by mouth every 4 (four) hours as needed for moderate pain (pain score 4-6), severe pain (pain score 7-10) or breakthrough pain. 03/29/24   Patsy Lenis, MD  OZEMPIC, 0.25 OR 0.5 MG/DOSE, 2 MG/3ML SOPN Inject 0.25 mg into the skin every Sunday.    [provider]  polyethylene glycol (MIRALAX  / GLYCOLAX ) 17 g packet Take 17 g by mouth daily. 03/29/24   Patsy Lenis, MD  potassium chloride  SA (KLOR-CON  M) 20 MEQ tablet Take 20 mEq by mouth daily.    [provider]  revefenacin  (YUPELRI ) 175 MCG/3ML nebulizer solution Take 3 mLs (175 mcg total) by nebulization daily. Patient not taking: Reported on 03/27/2024 03/25/24   Lue Elsie BROCKS, MD  senna-docusate (SENOKOT-S) 8.6-50 MG tablet Take 1 tablet by mouth at bedtime as needed for mild constipation. 03/29/24   Patsy Lenis, MD    Physical Exam: Vitals:   04/10/24 2100 04/10/24 2130 04/10/24 2200 04/10/24 2215  BP: (!) 151/78 (!) 162/76 (!) 152/74   Pulse: 86 83  92  Resp:      Temp:      TempSrc:      SpO2: 91% 94%  96%    Constitutional: NAD, calm, comfortable Vitals:   04/10/24 2100 04/10/24 2130 04/10/24 2200 04/10/24 2215  BP: (!) 151/78 (!) 162/76 (!) 152/74   Pulse: 86 83  92  Resp:      Temp:      TempSrc:      SpO2: 91% 94%  96%   Eyes: PERRL, lids and conjunctivae normal ENMT: Mucous membranes are dry  Neck: normal, supple, no masses, no thyromegaly Respiratory: clear to auscultation bilaterally, no wheezing, no crackles. Normal respiratory effort. No accessory muscle use.  Cardiovascular: Regular rate and rhythm, no murmurs / rubs / gallops. No extremity edema. 2+ pedal pulses.   Abdomen: soft , mild tenderness lower quad, no masses palpated. No hepatosplenomegaly. Bowel sounds positive.  Musculoskeletal: no clubbing / cyanosis. No joint deformity upper and lower extremities. Good ROM, no contractures. Normal muscle tone.  Skin: no rashes, lesions, ulcers. No induration Neurologic: CN 2-12 grossly intact. Sensation intact,  Strength 5/5 in all 4.  Psychiatric: Normal judgment and insight. Alert and oriented x 3. Normal mood.    Labs on Admission: I have personally reviewed following labs and imaging studies  CBC: Recent Labs  Lab 04/10/24 1548  WBC 7.0  HGB 15.7*  HCT 47.1*  MCV 95.2  PLT 157  Basic Metabolic Panel: Recent Labs  Lab 04/10/24 1548  NA 136  K 3.6  CL 100  CO2 25  GLUCOSE 279*  BUN 5*  CREATININE 0.64  CALCIUM 9.2   GFR: CrCl cannot be calculated (Unknown ideal weight.). Liver Function Tests: Recent Labs  Lab 04/10/24 1548  AST 11*  ALT <5  ALKPHOS 87  BILITOT 0.7  PROT 7.5  ALBUMIN 3.3*   Recent Labs  Lab 04/10/24 1548  LIPASE 28   No results for input(s): AMMONIA in the last 168 hours. Coagulation Profile: No results for input(s): INR, PROTIME in the last 168 hours. Cardiac Enzymes: No results for input(s): CKTOTAL, CKMB, CKMBINDEX, TROPONINI in the last 168 hours. BNP (last 3 results) Recent Labs    03/21/24 1310 03/25/24 1938  PROBNP 2,893.0* 646.0*   HbA1C: No results for input(s): HGBA1C in the last 72 hours. CBG: Recent Labs  Lab 04/10/24 2234  GLUCAP 219*   Lipid Profile: No results for input(s): CHOL, HDL, LDLCALC, TRIG, CHOLHDL, LDLDIRECT in the last 72 hours. Thyroid Function Tests: No results for input(s): TSH, T4TOTAL, FREET4, T3FREE, THYROIDAB in the last 72 hours. Anemia Panel: No results for input(s): VITAMINB12, FOLATE, FERRITIN, TIBC, IRON, RETICCTPCT in the last 72 hours. Urine analysis:    Component Value Date/Time   COLORURINE  STRAW (A) 03/22/2024 1234   APPEARANCEUR CLEAR 03/22/2024 1234   LABSPEC 1.006 03/22/2024 1234   PHURINE 5.0 03/22/2024 1234   GLUCOSEU NEGATIVE 03/22/2024 1234   HGBUR SMALL (A) 03/22/2024 1234   BILIRUBINUR NEGATIVE 03/22/2024 1234   KETONESUR NEGATIVE 03/22/2024 1234   PROTEINUR NEGATIVE 03/22/2024 1234   UROBILINOGEN 1.0 04/01/2015 0948   NITRITE NEGATIVE 03/22/2024 1234   LEUKOCYTESUR NEGATIVE 03/22/2024 1234    Radiological Exams on Admission: CT ABDOMEN PELVIS W CONTRAST Result Date: 04/10/2024 EXAM: CT ABDOMEN AND PELVIS WITH CONTRAST 04/10/2024 05:13:38 PM TECHNIQUE: CT of the abdomen and pelvis was performed with the administration of 125 mL of iohexol  (OMNIPAQUE ) 300 MG/ML solution. Multiplanar reformatted images are provided for review. Automated exposure control, iterative reconstruction, and/or weight-based adjustment of the mA/kV was utilized to reduce the radiation dose to as low as reasonably achievable. COMPARISON: CT abdomen and pelvis 03/25/2024. CLINICAL HISTORY: Diverticulitis, complication suspected. FINDINGS: LOWER CHEST: No acute abnormality. LIVER: The liver is unremarkable. GALLBLADDER AND BILE DUCTS: Gallbladder is unremarkable. No biliary ductal dilatation. SPLEEN: No acute abnormality. PANCREAS: No acute abnormality. ADRENAL GLANDS: No acute abnormality. KIDNEYS, URETERS AND BLADDER: No stones in the kidneys or ureters. No hydronephrosis. No perinephric or periureteral stranding. Urinary bladder is unremarkable. GI AND BOWEL: Persistent mid distal sigmoid diverticulitis with mild bowel thickening and pericolonic fat stranding superiorly. Microperforation is noted and best visualized on coronal images (8:111). There is no bowel obstruction. PERITONEUM AND RETROPERITONEUM: Interval development of a fluid and gas collection within the mid abdomen superior to the mid sigmoid colon (8:11). No free air. VASCULATURE: Aorta is normal in caliber. LYMPH NODES: No lymphadenopathy.  REPRODUCTIVE ORGANS: No acute abnormality. BONES AND SOFT TISSUES: No acute osseous abnormality. No focal soft tissue abnormality. Intervertebral disc space vacuum phenomenon. Grade 1 anterolisthesis of L4 on L5. Grade 1 anterolisthesis of L3 on L4. To a superior endplate concavity likely old healed fracture. IMPRESSION: 1. Acute complicated diverticulitis of the mid-distal sigmoid colon with microperforation and associated 5.4 x 6.1 x 9.2cm gas and fluid collection formation. Recommend colonoscopy status post treatment and status post complete resolution of inflammatory changes to exclude an underlying lesion. Electronically signed  by: Morgane Naveau MD 04/10/2024 05:45 PM EST RP Workstation: [id number]   DG Chest Portable 1 View Result Date: 04/10/2024 EXAM: 1 VIEW(S) XRAY OF THE CHEST 04/10/2024 03:30:00 PM COMPARISON: Comparison 03/22/2024. Stable cardiomediastinal silhouette. CLINICAL HISTORY: cp cp FINDINGS: LUNGS AND PLEURA: No focal pulmonary opacity. No pleural effusion. No pneumothorax. HEART AND MEDIASTINUM: Stable cardiomediastinal silhouette. BONES AND SOFT TISSUES: No acute osseous abnormality. IMPRESSION: 1. No acute cardiopulmonary disease. Electronically signed by: Lynwood Seip MD 04/10/2024 03:39 PM EST RP Workstation: [id number]    EKG: Independently reviewed. See above  Assessment/Plan    Acute complicated diverticulitis -noted in the mid-distal sigmoid colon - with associated microperforation and associated 5.4 x 6.1 x 9.2cm gas and fluid collection -admit to progressive care  -surgery follow  -continue on zosyn  -await further surgery recs    DMII -iss/fs     Hypertension -iv prn while npo   COPD -no acute exacerbation  -resume chronic inhalers    CHF pef -well compensated  -holding medications currently  -monitor fluid status closely of medications  Tobacco abuse -encourage cessation   DVT prophylaxis: heparin Code Status: full/ as discussed per patient  wishes in event of cardiac arrest  Family Communication: none at bedside Disposition Plan: patient  expected to be admitted greater than 2 midnights Consults called: Surgery  Admission status: med tele   Camila DELENA Ned MD [facility]   If 7PM-7AM, please contact night-coverage [email or web address] Password [email or web address]  04/11/2024, 12:06 AM

## 2024-04-11 NOTE — Consult Note (Signed)
 Lisa Ibarra [birth year]  [id number].    Requesting MD: Dr. Mignon Bump Chief Complaint/Reason for Consult: diverticulitis with abscess  HPI:  This is a 63 yo black female with a history of asthma, chronic pain, tobacco abuse, COPD, CHF, HTN, DM, who was admitted 11/7 - 11/11 with sepsis and diverticulitis.  Prior to that she was admitted for COPD exacerbation and was placed on steroids and other treatments.  She was discharged, but then readmitted due to sepsis and abdominal pain and found to have uncomplicated diverticulitis.  She was treated with abx therapy and discharged on 11/11.  She states she was improving, but when she ran out of pain medication at home her symptoms worsened.  She states she did complete her abx therapy at home.  Her pain worsened on Sunday.  She states she had a low grade temp at home at 99.6.  she admits to intermittent nausea and dry heaves.  She moved her bowels yesterday with no blood.  She last had a colonoscopy in May at [facility].  This revealed moderate sigmoid diverticulosis and mild in the TC and DC.  She had 2 2mm polyps in the sigmoid colon and these were removed.  An 8mm polyp was also found in the DC and removed as well.  Her recall is 3 years.   She presented to the ED last night for evaluation of persistent abdominal pain.  She has a normal WBC and a CT scan that reveals acute complicated diverticulitis with microperforation and an adjacent gas and fluid collection.  We have been asked to see the patient for further recommendations.  ROS: ROS: see HPI, complains of chronic pain in her back, legs, ankles, hips, etc.  Family History  Problem Relation Age of Onset   Stroke Mother    Lung cancer Father    Diabetes Mellitus II Sister     Past Medical History:  Diagnosis Date   Asthma    Cancer (HCC)    Cannabis abuse    Chronic pain    Diabetes mellitus type 2, controlled (HCC)    Hypertension     Past Surgical History:  Procedure Laterality  Date   CESAREAN SECTION     JOINT REPLACEMENT     Left eye     TUBAL LIGATION     TUMOR REMOVAL from back      Social History:  reports that she has been smoking cigarettes. She has never used smokeless tobacco. She reports current drug use. Drug: Marijuana. She reports that she does not drink alcohol.  Allergies:  Allergies  Allergen Reactions   Bee Venom Anaphylaxis and Hives   Empagliflozin Itching, Rash and Dermatitis   Ibuprofen Nausea And Vomiting   Statins Nausea Only and Other (See Comments)    Muscle spasms and cramps, too    Medications Prior to Admission  Medication Sig Dispense Refill   albuterol  (VENTOLIN  HFA) 108 (90 Base) MCG/ACT inhaler Inhale 2 puffs into the lungs every 6 (six) hours as needed for wheezing or shortness of breath. 1 each 3   Aspirin-Acetaminophen -Caffeine  (GOODYS EXTRA STRENGTH) 500-325-65 MG PACK Take 1 packet by mouth See admin instructions. Dissolve 1 packet in the mouth up to six times a day as needed for pain     atorvastatin (LIPITOR) 20 MG tablet Take 20 mg by mouth daily.     furosemide  (LASIX ) 20 MG tablet Take 20-40 mg by mouth daily as needed for fluid or edema.  losartan  (COZAAR ) 25 MG tablet Take 1 tablet (25 mg total) by mouth daily. 90 tablet 0   metoprolol  succinate (TOPROL -XL) 25 MG 24 hr tablet Take 0.5 tablets (12.5 mg total) by mouth at bedtime. 45 tablet 0   OZEMPIC, 0.25 OR 0.5 MG/DOSE, 2 MG/3ML SOPN Inject 0.25 mg into the skin every Sunday.     potassium chloride  SA (KLOR-CON  M) 20 MEQ tablet Take 20 mEq by mouth daily.     arformoterol (BROVANA) 15 MCG/2ML NEBU Take 2 mLs (15 mcg total) by nebulization 2 (two) times daily. (Patient not taking: Reported on 03/27/2024) 120 mL 0   blood glucose meter kit and supplies KIT Dispense based on patient and insurance preference. Use up to four times daily as directed. 1 each 0   budesonide (PULMICORT) 0.5 MG/2ML nebulizer solution Take 2 mLs (0.5 mg total) by nebulization 2 (two)  times daily. (Patient not taking: Reported on 03/27/2024) 120 mL 0   Insulin  Pen Needle 29G X MISC For insulin  injection 100 each 1   oxyCODONE  (OXY IR/ROXICODONE ) 5 MG immediate release tablet Take 1 tablet (5 mg total) by mouth every 4 (four) hours as needed for moderate pain (pain score 4-6), severe pain (pain score 7-10) or breakthrough pain. (Patient not taking: Reported on 04/11/2024) 30 tablet 0   polyethylene glycol (MIRALAX / GLYCOLAX) 17 g packet Take 17 g by mouth daily. (Patient not taking: Reported on 04/11/2024)     revefenacin (YUPELRI) 175 MCG/3ML nebulizer solution Take 3 mLs (175 mcg total) by nebulization daily. (Patient not taking: Reported on 03/27/2024) 90 mL 0   senna-docusate (SENOKOT-S) 8.6-50 MG tablet Take 1 tablet by mouth at bedtime as needed for mild constipation. (Patient not taking: Reported on 04/11/2024)       Physical Exam: Blood pressure (!) 154/69, pulse 74, temperature (!) 97.5 F (36.4 C), resp. rate 16, last menstrual period 06/12/2011, SpO2 97%. General:  WD, WN, obese female who is sitting up in her chair in NAD HEENT: head is normocephalic, atraumatic.  Sclera are noninjected.  PERRL.  Ears and nose without any masses or lesions.  Mouth is pink and dry Heart: regular, rate, and rhythm.  Normal s1,s2. No obvious murmurs, gallops, or rubs noted.   Lungs: CTAB, no wheezes, rhonchi, or rales noted.  Respiratory effort nonlabored Abd: soft, minimally tender in her lower abdomen, ND, but obese, +BS, no masses or hernias. Psych: A&Ox3 with an appropriate affect.   Results for orders placed or performed during the hospital encounter of 04/10/24 (from the past 48 hours)  Basic metabolic panel     Status: Abnormal   Collection Time: 04/10/24  3:48 PM  Result Value Ref Range   Sodium 136 135 - 145 mmol/L   Potassium 3.6 3.5 - 5.1 mmol/L   Chloride 100 98 - 111 mmol/L   CO2 25 22 - 32 mmol/L   Glucose, Bld 279 (H) 70 - 99 mg/dL    Comment: Glucose  reference range applies only to samples taken after fasting for at least 8 hours.   BUN 5 (L) 8 - 23 mg/dL   Creatinine, Ser 9.35 0.44 - 1.00 mg/dL   Calcium 9.2 8.9 - 89.6 mg/dL   GFR, Estimated >39 >39 mL/min    Comment: (NOTE) Calculated using the CKD-EPI Creatinine Equation (2021)    Anion gap 12 5 - 15    Comment: Performed at [facility], [address] [place], [place] [address]  CBC  Status: Abnormal   Collection Time: 04/10/24  3:48 PM  Result Value Ref Range   WBC 7.0 4.0 - 10.5 K/uL   RBC 4.95 3.87 - 5.11 MIL/uL   Hemoglobin 15.7 (H) 12.0 - 15.0 g/dL   HCT 52.8 (H) 63.9 - 53.9 %   MCV 95.2 80.0 - 100.0 fL   MCH 31.7 26.0 - 34.0 pg   MCHC 33.3 30.0 - 36.0 g/dL   RDW 86.6 88.4 - 84.4 %   Platelets 157 150 - 400 K/uL   nRBC 0.0 0.0 - 0.2 %    Comment: Performed at [facility], [address] [place], [place] [address]  Troponin T, High Sensitivity     Status: None   Collection Time: 04/10/24  3:48 PM  Result Value Ref Range   Troponin T High Sensitivity <15 0 - 19 ng/L    Comment: (NOTE) Biotin concentrations > 1000 ng/mL falsely decrease TnT results.  Serial cardiac troponin measurements are suggested.  Refer to the Links section for chest pain algorithms and additional  guidance. Performed at [facility], [address] [place], [place] [address]   Lipase, blood     Status: None   Collection Time: 04/10/24  3:48 PM  Result Value Ref Range   Lipase 28 11 - 51 U/L    Comment: Performed at [facility], [address] [place], [place] [address]  Hepatic function panel     Status: Abnormal   Collection Time: 04/10/24  3:48 PM  Result Value Ref Range   Total Protein 7.5 6.5 - 8.1 g/dL   Albumin 3.3 (L) 3.5 - 5.0 g/dL   AST 11 (L) 15 - 41 U/L   ALT <5 0 - 44 U/L   Alkaline Phosphatase 87 38 - 126 U/L   Total Bilirubin 0.7 0.0 - 1.2 mg/dL   Bilirubin, Direct 0.3 (H) 0.0 - 0.2 mg/dL   Indirect Bilirubin 0.3  0.3 - 0.9 mg/dL    Comment: Performed at [facility], [address] [place], [place] [address]  Culture, blood (routine x 2)     Status: None (Preliminary result)   Collection Time: 04/10/24  3:48 PM   Specimen: Right Antecubital; Blood  Result Value Ref Range   Specimen Description      RIGHT ANTECUBITAL Performed at [facility], [address] [place], [place] [address]    Special Requests      BOTTLES DRAWN AEROBIC AND ANAEROBIC Blood Culture adequate volume Performed at [facility], [address] [place], [place] [address]    Culture      NO GROWTH < 12 HOURS Performed at [facility], [address] [place], [place] [address]    Report Status PENDING   CBG monitoring, ED     Status: Abnormal   Collection Time: 04/10/24 10:34 PM  Result Value Ref Range   Glucose-Capillary 219 (H) 70 - 99 mg/dL    Comment: Glucose reference range applies only to samples taken after fasting for at least 8 hours.   Comment 1 Notify RN    Comment 2 Document in Chart   Glucose, capillary     Status: Abnormal   Collection Time: 04/11/24 12:26 AM  Result Value Ref Range   Glucose-Capillary 214 (H) 70 - 99 mg/dL    Comment: Glucose reference range applies only to samples taken after fasting for at least 8  hours.  Culture, blood (routine x 2)     Status: None (Preliminary result)   Collection Time: 04/11/24  1:10 AM   Specimen: BLOOD LEFT ARM  Result Value Ref Range   Specimen Description      BLOOD LEFT ARM Performed at [facility], [address], [place], [place] [address]    Special Requests      BOTTLES DRAWN AEROBIC AND ANAEROBIC Blood Culture results may not be optimal due to an inadequate volume of blood received in culture bottles Performed at [facility], [address], [place], [place] [address]    Culture PENDING    Report Status PENDING   CBC with Differential/Platelet     Status: Abnormal   Collection  Time: 04/11/24  1:10 AM  Result Value Ref Range   WBC 7.3 4.0 - 10.5 K/uL   RBC 4.78 3.87 - 5.11 MIL/uL   Hemoglobin 15.0 12.0 - 15.0 g/dL   HCT 52.7 (H) 63.9 - 53.9 %   MCV 98.7 80.0 - 100.0 fL   MCH 31.4 26.0 - 34.0 pg   MCHC 31.8 30.0 - 36.0 g/dL   RDW 86.7 88.4 - 84.4 %   Platelets 144 (L) 150 - 400 K/uL   nRBC 0.0 0.0 - 0.2 %   Neutrophils Relative % 72 %   Neutro Abs 5.3 1.7 - 7.7 K/uL   Lymphocytes Relative 15 %   Lymphs Abs 1.1 0.7 - 4.0 K/uL   Monocytes Relative 11 %   Monocytes Absolute 0.8 0.1 - 1.0 K/uL   Eosinophils Relative 2 %   Eosinophils Absolute 0.1 0.0 - 0.5 K/uL   Basophils Relative 0 %   Basophils Absolute 0.0 0.0 - 0.1 K/uL   Immature Granulocytes 0 %   Abs Immature Granulocytes 0.03 0.00 - 0.07 K/uL    Comment: Performed at [facility], [address], [place], [place] [address]  Comprehensive metabolic panel     Status: Abnormal   Collection Time: 04/11/24  1:10 AM  Result Value Ref Range   Sodium 138 135 - 145 mmol/L   Potassium 3.5 3.5 - 5.1 mmol/L   Chloride 103 98 - 111 mmol/L   CO2 26 22 - 32 mmol/L   Glucose, Bld 195 (H) 70 - 99 mg/dL    Comment: Glucose reference range applies only to samples taken after fasting for at least 8 hours.   BUN <5 (L) 8 - 23 mg/dL   Creatinine, Ser 9.27 0.44 - 1.00 mg/dL   Calcium 9.1 8.9 - 89.6 mg/dL   Total Protein 7.3 6.5 - 8.1 g/dL   Albumin 3.0 (L) 3.5 - 5.0 g/dL   AST 11 (L) 15 - 41 U/L   ALT 5 0 - 44 U/L   Alkaline Phosphatase 81 38 - 126 U/L   Total Bilirubin 0.7 0.0 - 1.2 mg/dL   GFR, Estimated >39 >39 mL/min    Comment: (NOTE) Calculated using the CKD-EPI Creatinine Equation (2021)    Anion gap 9 5 - 15    Comment: Performed at [facility], [address], [place], [place] [address]  Glucose, capillary     Status: Abnormal   Collection Time: 04/11/24  4:22 AM  Result Value Ref Range   Glucose-Capillary 215 (H) 70 - 99 mg/dL    Comment: Glucose reference  range applies only to samples taken after fasting for at least 8 hours.  Glucose, capillary     Status: Abnormal  Collection Time: 04/11/24  7:35 AM  Result Value Ref Range   Glucose-Capillary 187 (H) 70 - 99 mg/dL    Comment: Glucose reference range applies only to samples taken after fasting for at least 8 hours.   CT ABDOMEN PELVIS W CONTRAST Result Date: 04/10/2024 EXAM: CT ABDOMEN AND PELVIS WITH CONTRAST 04/10/2024 05:13:38 PM TECHNIQUE: CT of the abdomen and pelvis was performed with the administration of 125 mL of iohexol  (OMNIPAQUE ) 300 MG/ML solution. Multiplanar reformatted images are provided for review. Automated exposure control, iterative reconstruction, and/or weight-based adjustment of the mA/kV was utilized to reduce the radiation dose to as low as reasonably achievable. COMPARISON: CT abdomen and pelvis 03/25/2024. CLINICAL HISTORY: Diverticulitis, complication suspected. FINDINGS: LOWER CHEST: No acute abnormality. LIVER: The liver is unremarkable. GALLBLADDER AND BILE DUCTS: Gallbladder is unremarkable. No biliary ductal dilatation. SPLEEN: No acute abnormality. PANCREAS: No acute abnormality. ADRENAL GLANDS: No acute abnormality. KIDNEYS, URETERS AND BLADDER: No stones in the kidneys or ureters. No hydronephrosis. No perinephric or periureteral stranding. Urinary bladder is unremarkable. GI AND BOWEL: Persistent mid distal sigmoid diverticulitis with mild bowel thickening and pericolonic fat stranding superiorly. Microperforation is noted and best visualized on coronal images (8:111). There is no bowel obstruction. PERITONEUM AND RETROPERITONEUM: Interval development of a fluid and gas collection within the mid abdomen superior to the mid sigmoid colon (8:11). No free air. VASCULATURE: Aorta is normal in caliber. LYMPH NODES: No lymphadenopathy. REPRODUCTIVE ORGANS: No acute abnormality. BONES AND SOFT TISSUES: No acute osseous abnormality. No focal soft tissue abnormality.  Intervertebral disc space vacuum phenomenon. Grade 1 anterolisthesis of L4 on L5. Grade 1 anterolisthesis of L3 on L4. To a superior endplate concavity likely old healed fracture. IMPRESSION: 1. Acute complicated diverticulitis of the mid-distal sigmoid colon with microperforation and associated 5.4 x 6.1 x 9.2cm gas and fluid collection formation. Recommend colonoscopy status post treatment and status post complete resolution of inflammatory changes to exclude an underlying lesion. Electronically signed by: Morgane Naveau MD 04/10/2024 05:45 PM EST RP Workstation: [id number]   DG Chest Portable 1 View Result Date: 04/10/2024 EXAM: 1 VIEW(S) XRAY OF THE CHEST 04/10/2024 03:30:00 PM COMPARISON: Comparison 03/22/2024. Stable cardiomediastinal silhouette. CLINICAL HISTORY: cp cp FINDINGS: LUNGS AND PLEURA: No focal pulmonary opacity. No pleural effusion. No pneumothorax. HEART AND MEDIASTINUM: Stable cardiomediastinal silhouette. BONES AND SOFT TISSUES: No acute osseous abnormality. IMPRESSION: 1. No acute cardiopulmonary disease. Electronically signed by: Lynwood Seip MD 04/10/2024 03:39 PM EST RP Workstation: [id number]      Assessment/Plan Diverticulitis with microperforation and abscess/phlegmon The patient has been seen, examined, labs, vitals, chart, and imaging personally reviewed.  She has diverticulitis with a very central phlegmon appearing collection.  This is likely not drainable by IR due to location and consistency.  She is stable and her abdominal exam is very benign at this time.  We will treat her conservatively with abx therapy for now. She  may have CLD today.  We did discuss that if she fails to improve with conservative management, she could require a Hartmann's procedure.  She would like to avoid this if able.  We will continue to follow with you.   FEN - CLD/IVFs per [facility] VTE - heparin ID - zosyn  CHF DM COPD Asthma HTN Obesity  Tobacco abuse Chronic pain - occasional  marijuana use for this  I reviewed nursing notes, hospitalist notes, last 24 h vitals and pain scores, last 48 h intake and output, last 24 h labs and trends, and last 24  h imaging results.  Burnard FORBES Banter, [facility] Surgery 04/11/2024, 10:12 AM Please see Amion for pager number during day hours 7:00am-4:30pm or 7:00am -11:30am on weekends

## 2024-04-11 NOTE — Plan of Care (Signed)
   Problem: Clinical Measurements: Goal: Diagnostic test results will improve Outcome: Progressing

## 2024-04-11 NOTE — Progress Notes (Signed)
 PROGRESS NOTE  CHARAE DEPAOLIS FMW:[id number] DOB: [birth year]   PCP: Cesario Mutton, MD  Patient is from: Home.  DOA: 04/10/2024 LOS: 1  Chief complaints Chief Complaint  Patient presents with   Chest Pain     Brief Narrative / Interim history: 63 year old F with PMH of COPD, HFpEF, DM-2, chronic pain, HTN, tobacco use disorder and recent hospitalization for sigmoid diverticulitis from 11/7-11/11 for which she was managed conservatively with bowel rest and antibiotics, returning with worsening abdominal pain with intermittent nausea and vomiting, and admitted with acute complicated sigmoid diverticulitis with microperforation and possible abscess.  In ED, slightly hypertensive.  Otherwise stable.  No leukocytosis.  Basic labs including CMP and CBC without significant finding.  Lipase 28.  CXR without acute finding.  KG NSR and LAD.  CT abdomen and pelvis showed acute complicated diverticulitis of the mid distal sigmoid colon with microperforation and associated 5.4 x 6.1 x 9.2 gas and fluid collections.  Started on IV antibiotics.  General surgery consulted.  The next day, started on clear liquid diet.   Subjective: Seen and examined earlier this morning.  No major events overnight or this morning.  She is frustrated and crying about being n.p.o. feels thirsty and hungry.  Reports 11/10 pain although she does not appear to be in that much distress.  Pain is across lower abdomen on both sides.   Assessment and plan: Acute complicated sigmoid diverticulitis with microperforation and possible abscess-recently admitted from 11/7-11/11 for which she was managed conservatively with bowel rest and antibiotics.  Returns with worsening pain with intermittent nausea and vomiting.  CT abdomen and pelvis as above.  No leukocytosis or fever.  Dynamically stable.  Exam with mild tenderness across lower abdomen.  No rebound or guarding.  Blood cultures NGTD. - Continue IV Zosyn - General Surgery on  board-started clear liquid diet - Multimodal pain control with as needed Tylenol , oxycodone  and Dilaudid  Chronic HFpEF: TTE on 10/30 with LVEF of 55 to 60%, G1DD.  No cardiopulmonary symptoms.  On Lasix  20 to 40 mg daily as needed at home.  Appears euvolemic on exam. - Hold home Lasix  for now - Continue home Toprol -XL and losartan  - Monitor fluid status closely.  Chronic COPD: Stable. - Continue home bronchodilators/nebulizers.  NIDDM-2 with hyperglycemia: A1c 7.0% on 10/30.  Seems to be on Ozempic at home. Recent Labs  Lab 04/10/24 2234 04/11/24 0026 04/11/24 0422 04/11/24 0735  GLUCAP 219* 214* 215* 187*  - Increase SSI to moderate  Essential hypertension: BP slightly elevated - Resume home losartan  and Toprol -XL - Pain control as above  Tobacco use disorder -Courage cessation. - Nicotine patch   There is no height or weight on file to calculate BMI.           DVT prophylaxis:  heparin injection 5,000 Units Start: 04/11/24 0600  Code Status: Full code Family Communication: None at bedside Level of care: Med-Surg Status is: Inpatient Remains inpatient appropriate because: Acute complicated diverticulitis with microperforation and possible abscess   Final disposition: Home   55 minutes with more than 50% spent in reviewing records, counseling patient/family and coordinating care.  Consultants:  General Surgery  Procedures: None  Microbiology summarized: Blood cultures NGTD  Objective: Vitals:   04/10/24 2200 04/10/24 2215 04/11/24 0207 04/11/24 0421  BP: (!) 152/74  (!) 158/97 (!) 154/69  Pulse:  92 80 74  Resp:   15 16  Temp:   98.6 F (37 C) (!) 97.5 F (36.4 C)  TempSrc:   Oral   SpO2:  96% 94% 97%    Examination:  GENERAL: No apparent distress.  Nontoxic. HEENT: MMM.  Vision and hearing grossly intact.  NECK: Supple.  No apparent JVD.  RESP:  No IWOB.  Fair aeration bilaterally. CVS:  RRR. Heart sounds normal.  ABD/GI/GU: BS+. Abd  soft.  Mild tenderness with deep palpation in RLQ.  No rebound or guarding. MSK/EXT:  Moves extremities. No apparent deformity. No edema.  SKIN: no apparent skin lesion or wound NEURO: AA.  Oriented appropriately.  No apparent focal neuro deficit. PSYCH: Tearful about not having something to drink or eat.  Sch Meds:  Scheduled Meds:  arformoterol  15 mcg Nebulization BID   atorvastatin  20 mg Oral Daily   budesonide  0.5 mg Nebulization BID   heparin  5,000 Units Subcutaneous Q8H   insulin  aspart  0-6 Units Subcutaneous Q4H   losartan   25 mg Oral Daily   metoprolol  succinate  12.5 mg Oral QHS   revefenacin  175 mcg Nebulization Daily   Continuous Infusions:  sodium chloride  125 mL/hr at 04/11/24 0924   piperacillin-tazobactam (ZOSYN)  IV 3.375 g (04/11/24 0534)   PRN Meds:.albuterol , HYDROmorphone (DILAUDID) injection, ondansetron  **OR** ondansetron  (ZOFRAN ) IV  Antimicrobials: Anti-infectives (From admission, onward)    Start     Dose/Rate Route Frequency Ordered Stop   04/10/24 1945  piperacillin-tazobactam (ZOSYN) IVPB 3.375 g        3.375 g 12.5 mL/hr over 240 Minutes Intravenous Every 8 hours 04/10/24 1933     04/10/24 1800  cefTRIAXone (ROCEPHIN) 1 g in sodium chloride  0.9 % 100 mL IVPB        1 g 200 mL/hr over 30 Minutes Intravenous  Once 04/10/24 1755 04/10/24 1903   04/10/24 1800  metroNIDAZOLE (FLAGYL) IVPB 500 mg        500 mg 100 mL/hr over 60 Minutes Intravenous  Once 04/10/24 1755 04/10/24 1957        I have personally reviewed the following labs and images: CBC: Recent Labs  Lab 04/10/24 1548 04/11/24 0110  WBC 7.0 7.3  NEUTROABS  --  5.3  HGB 15.7* 15.0  HCT 47.1* 47.2*  MCV 95.2 98.7  PLT 157 144*   BMP &GFR Recent Labs  Lab 04/10/24 1548 04/11/24 0110  NA 136 138  K 3.6 3.5  CL 100 103  CO2 25 26  GLUCOSE 279* 195*  BUN 5* <5*  CREATININE 0.64 0.72  CALCIUM 9.2 9.1   CrCl cannot be calculated (Unknown ideal weight.). Liver &  Pancreas: Recent Labs  Lab 04/10/24 1548 04/11/24 0110  AST 11* 11*  ALT <5 5  ALKPHOS 87 81  BILITOT 0.7 0.7  PROT 7.5 7.3  ALBUMIN 3.3* 3.0*   Recent Labs  Lab 04/10/24 1548  LIPASE 28   No results for input(s): AMMONIA in the last 168 hours. Diabetic: No results for input(s): HGBA1C in the last 72 hours. Recent Labs  Lab 04/10/24 2234 04/11/24 0026 04/11/24 0422 04/11/24 0735  GLUCAP 219* 214* 215* 187*   Cardiac Enzymes: No results for input(s): CKTOTAL, CKMB, CKMBINDEX, TROPONINI in the last 168 hours. Recent Labs    03/21/24 1310 03/25/24 1938  PROBNP 2,893.0* 646.0*   Coagulation Profile: No results for input(s): INR, PROTIME in the last 168 hours. Thyroid Function Tests: No results for input(s): TSH, T4TOTAL, FREET4, T3FREE, THYROIDAB in the last 72 hours. Lipid Profile: No results for input(s): CHOL, HDL, LDLCALC, TRIG, CHOLHDL, LDLDIRECT  in the last 72 hours. Anemia Panel: No results for input(s): VITAMINB12, FOLATE, FERRITIN, TIBC, IRON, RETICCTPCT in the last 72 hours. Urine analysis:    Component Value Date/Time   COLORURINE STRAW (A) 03/22/2024 1234   APPEARANCEUR CLEAR 03/22/2024 1234   LABSPEC 1.006 03/22/2024 1234   PHURINE 5.0 03/22/2024 1234   GLUCOSEU NEGATIVE 03/22/2024 1234   HGBUR SMALL (A) 03/22/2024 1234   BILIRUBINUR NEGATIVE 03/22/2024 1234   KETONESUR NEGATIVE 03/22/2024 1234   PROTEINUR NEGATIVE 03/22/2024 1234   UROBILINOGEN 1.0 04/01/2015 0948   NITRITE NEGATIVE 03/22/2024 1234   LEUKOCYTESUR NEGATIVE 03/22/2024 1234   Sepsis Labs: Invalid input(s): PROCALCITONIN, LACTICIDVEN  Microbiology: Recent Results (from the past 240 hours)  Culture, blood (routine x 2)     Status: None (Preliminary result)   Collection Time: 04/10/24  3:48 PM   Specimen: Right Antecubital; Blood  Result Value Ref Range Status   Specimen Description   Final    RIGHT ANTECUBITAL Performed  at [facility], [address] [place], [place] [address]    Special Requests   Final    BOTTLES DRAWN AEROBIC AND ANAEROBIC Blood Culture adequate volume Performed at [facility], [address] [place], [place] [address]    Culture   Final    NO GROWTH < 12 HOURS Performed at [facility], [address], [place], [place] [address]    Report Status PENDING  Incomplete  Culture, blood (routine x 2)     Status: None (Preliminary result)   Collection Time: 04/11/24  1:10 AM   Specimen: BLOOD LEFT ARM  Result Value Ref Range Status   Specimen Description   Final    BLOOD LEFT ARM Performed at [facility], [address] [place], [place] [address]    Special Requests   Final    BOTTLES DRAWN AEROBIC AND ANAEROBIC Blood Culture results may not be optimal due to an inadequate volume of blood received in culture bottles Performed at [facility], [address], [place], [place] [address]    Culture PENDING  Incomplete   Report Status PENDING  Incomplete    Radiology Studies: CT ABDOMEN PELVIS W CONTRAST Result Date: 04/10/2024 EXAM: CT ABDOMEN AND PELVIS WITH CONTRAST 04/10/2024 05:13:38 PM TECHNIQUE: CT of the abdomen and pelvis was performed with the administration of 125 mL of iohexol  (OMNIPAQUE ) 300 MG/ML solution. Multiplanar reformatted images are provided for review. Automated exposure control, iterative reconstruction, and/or weight-based adjustment of the mA/kV was utilized to reduce the radiation dose to as low as reasonably achievable. COMPARISON: CT abdomen and pelvis 03/25/2024. CLINICAL HISTORY: Diverticulitis, complication suspected. FINDINGS: LOWER CHEST: No acute abnormality. LIVER: The liver is unremarkable. GALLBLADDER AND BILE DUCTS: Gallbladder is unremarkable. No biliary ductal dilatation. SPLEEN: No acute abnormality. PANCREAS: No acute abnormality. ADRENAL GLANDS: No acute abnormality. KIDNEYS, URETERS AND  BLADDER: No stones in the kidneys or ureters. No hydronephrosis. No perinephric or periureteral stranding. Urinary bladder is unremarkable. GI AND BOWEL: Persistent mid distal sigmoid diverticulitis with mild bowel thickening and pericolonic fat stranding superiorly. Microperforation is noted and best visualized on coronal images (8:111). There is no bowel obstruction. PERITONEUM AND RETROPERITONEUM: Interval development of a fluid and gas collection within the mid abdomen superior to the mid sigmoid colon (8:11). No free air. VASCULATURE: Aorta is normal in caliber. LYMPH NODES: No lymphadenopathy. REPRODUCTIVE ORGANS: No acute abnormality. BONES AND SOFT TISSUES: No acute osseous abnormality. No focal soft  tissue abnormality. Intervertebral disc space vacuum phenomenon. Grade 1 anterolisthesis of L4 on L5. Grade 1 anterolisthesis of L3 on L4. To a superior endplate concavity likely old healed fracture. IMPRESSION: 1. Acute complicated diverticulitis of the mid-distal sigmoid colon with microperforation and associated 5.4 x 6.1 x 9.2cm gas and fluid collection formation. Recommend colonoscopy status post treatment and status post complete resolution of inflammatory changes to exclude an underlying lesion. Electronically signed by: Morgane Naveau MD 04/10/2024 05:45 PM EST RP Workstation: [id number]   DG Chest Portable 1 View Result Date: 04/10/2024 EXAM: 1 VIEW(S) XRAY OF THE CHEST 04/10/2024 03:30:00 PM COMPARISON: Comparison 03/22/2024. Stable cardiomediastinal silhouette. CLINICAL HISTORY: cp cp FINDINGS: LUNGS AND PLEURA: No focal pulmonary opacity. No pleural effusion. No pneumothorax. HEART AND MEDIASTINUM: Stable cardiomediastinal silhouette. BONES AND SOFT TISSUES: No acute osseous abnormality. IMPRESSION: 1. No acute cardiopulmonary disease. Electronically signed by: Lynwood Seip MD 04/10/2024 03:39 PM EST RP Workstation: [id number]      Ujbz T. Darene Nappi [facility]  If 7PM-7AM, please  contact night-coverage [email or web address] 04/11/2024, 10:16 AM

## 2024-04-11 NOTE — Inpatient Diabetes Management (Signed)
 Inpatient Diabetes Program Recommendations  AACE/ADA: New Consensus Statement on Inpatient Glycemic Control (2015)  Target Ranges:  Prepandial:   less than 140 mg/dL      Peak postprandial:   less than 180 mg/dL (1-2 hours)      Critically ill patients:  140 - 180 mg/dL   Lab Results  Component Value Date   GLUCAP 238 (H) 04/11/2024   HGBA1C 7.0 (H) 03/21/2024    Review of Glycemic Control  Latest Reference Range & Units 04/10/24 22:34 04/11/24 00:26 04/11/24 04:22 04/11/24 07:35 04/11/24 11:11  Glucose-Capillary 70 - 99 mg/dL 780 (H) 785 (H) 784 (H) 187 (H) 238 (H)  (H): Data is abnormally high  Diabetes history: DM2 Outpatient Diabetes medications: Ozempic 0.25 mg weekly Current orders for Inpatient glycemic control: Novolog  0-15 units TID   Inpatient Diabetes Program Recommendations:    Might consider discontinuing Ozempic given hx of diverticulitis and microperforation.    Thank you, Wyvonna Pinal, MSN, CDCES Diabetes Coordinator Inpatient Diabetes Program [phone] (team pager from 8a-5p)

## 2024-04-12 DIAGNOSIS — K5792 Diverticulitis of intestine, part unspecified, without perforation or abscess without bleeding: Secondary | ICD-10-CM | POA: Diagnosis not present

## 2024-04-12 DIAGNOSIS — K5720 Diverticulitis of large intestine with perforation and abscess without bleeding: Secondary | ICD-10-CM | POA: Diagnosis not present

## 2024-04-12 LAB — RENAL FUNCTION PANEL
Albumin: 3 g/dL — ABNORMAL LOW (ref 3.5–5.0)
Anion gap: 9 (ref 5–15)
BUN: <5 mg/dL — ABNORMAL LOW (ref 8–23)
CO2: 26 mmol/L (ref 22–32)
Calcium: 8.7 mg/dL — ABNORMAL LOW (ref 8.9–10.3)
Chloride: 99 mmol/L (ref 98–111)
Creatinine, Ser: 0.73 mg/dL (ref 0.44–1.00)
GFR, Estimated: >60 mL/min (ref >60)
Glucose, Bld: 152 mg/dL — ABNORMAL HIGH (ref 70–99)
Phosphorus: 2.6 mg/dL (ref 2.5–4.6)
Potassium: 3.4 mmol/L — ABNORMAL LOW (ref 3.5–5.1)
Sodium: 134 mmol/L — ABNORMAL LOW (ref 135–145)

## 2024-04-12 LAB — GLUCOSE, CAPILLARY
Glucose-Capillary: 180 mg/dL — ABNORMAL HIGH (ref 70–99)
Glucose-Capillary: 165 mg/dL — ABNORMAL HIGH (ref 70–99)
Glucose-Capillary: 172 mg/dL — ABNORMAL HIGH (ref 70–99)
Glucose-Capillary: 113 mg/dL — ABNORMAL HIGH (ref 70–99)

## 2024-04-12 LAB — CBC
HCT: 43 % (ref 36.0–46.0)
Hemoglobin: 13.9 g/dL (ref 12.0–15.0)
MCH: 31.8 pg (ref 26.0–34.0)
MCHC: 32.3 g/dL (ref 30.0–36.0)
MCV: 98.4 fL (ref 80.0–100.0)
Platelets: 141 K/uL — ABNORMAL LOW (ref 150–400)
RBC: 4.37 MIL/uL (ref 3.87–5.11)
RDW: 13.1 % (ref 11.5–15.5)
WBC: 6.2 K/uL (ref 4.0–10.5)
nRBC: 0 % (ref 0.0–0.2)

## 2024-04-12 LAB — MAGNESIUM: Magnesium: 1.4 mg/dL — ABNORMAL LOW (ref 1.7–2.4)

## 2024-04-12 MED ORDER — INSULIN ASPART 100 UNIT/ML IJ SOLN
3.0000 [IU] | Freq: Three times a day (TID) | INTRAMUSCULAR | Status: DC
  Administered 2024-04-12 – 2024-04-13 (×3): 3 [IU] via SUBCUTANEOUS
  Filled 2024-04-12 (×3): qty 3

## 2024-04-12 MED ORDER — POTASSIUM CHLORIDE CRYS ER 20 MEQ PO TBCR
60.0000 meq | EXTENDED_RELEASE_TABLET | Freq: Once | ORAL | Status: AC
  Administered 2024-04-12: 60 meq via ORAL
  Filled 2024-04-12: qty 3

## 2024-04-12 MED ORDER — MAGNESIUM SULFATE 2 GM/50ML IV SOLN
2.0000 g | Freq: Once | INTRAVENOUS | Status: AC
  Administered 2024-04-12: 2 g via INTRAVENOUS
  Filled 2024-04-12: qty 50

## 2024-04-12 MED ORDER — HYDRALAZINE HCL 25 MG PO TABS
25.0000 mg | ORAL_TABLET | Freq: Four times a day (QID) | ORAL | Status: DC | PRN
  Administered 2024-04-15 – 2024-04-17 (×4): 25 mg via ORAL
  Filled 2024-04-12 (×3): qty 1

## 2024-04-12 MED ORDER — INSULIN GLARGINE-YFGN 100 UNIT/ML ~~LOC~~ SOLN
10.0000 [IU] | Freq: Every day | SUBCUTANEOUS | Status: DC
  Administered 2024-04-12 – 2024-04-13 (×2): 10 [IU] via SUBCUTANEOUS
  Filled 2024-04-12 (×2): qty 0.1

## 2024-04-12 MED ORDER — LOSARTAN POTASSIUM 50 MG PO TABS
50.0000 mg | ORAL_TABLET | Freq: Every day | ORAL | Status: DC
  Administered 2024-04-12 – 2024-04-15 (×4): 50 mg via ORAL
  Filled 2024-04-12 (×4): qty 1

## 2024-04-12 NOTE — Progress Notes (Signed)
   04/12/24 1018  TOC Brief Assessment  Insurance and Status Reviewed  Patient has primary care physician Yes  Home environment has been reviewed home with family  Prior level of function: independent  Prior/Current Home Services No current home services  Social Drivers of Health Review SDOH reviewed no interventions necessary  Readmission risk has been reviewed Yes  Transition of care needs no transition of care needs at this time

## 2024-04-12 NOTE — Plan of Care (Signed)
   Problem: Coping: Goal: Ability to adjust to condition or change in health will improve Outcome: Progressing   Problem: Fluid Volume: Goal: Ability to maintain a balanced intake and output will improve Outcome: Progressing   Problem: Health Behavior/Discharge Planning: Goal: Ability to identify and utilize available resources and services will improve Outcome: Progressing

## 2024-04-12 NOTE — Progress Notes (Signed)
 Subjective: Still using dilaudid  for pain in her lower abdomen.  Tolerating the CLD and wanting food really badly.  ROS: See above, otherwise other systems negative  Objective: Vital signs in last 24 hours: Temp:  [97.8 F (36.6 C)-98.9 F (37.2 C)] 97.8 F (36.6 C) (11/21 0610) Pulse Rate:  [75-79] 78 (11/21 0610) Resp:  [16-18] 16 (11/21 0610) BP: (134-157)/(85-105) 149/85 (11/21 0610) SpO2:  [92 %-98 %] 92 % (11/21 0845) Last BM Date : 04/09/24  Intake/Output from previous day: 11/20 0701 - 11/21 0700 In: 5766.8 [P.O.:4720; I.V.:951.5; IV Piggyback:95.4] Out: 650 [Urine:650] Intake/Output this shift: Total I/O In: 54.7 [IV Piggyback:54.7] Out: -   PE: Gen: NAD, sitting up in a chair Abd: soft, NT to palpation (says pain is deep in her abdomen and not affected by palpation), ND, obese  Lab Results:  Recent Labs    04/11/24 0110 04/12/24 0444  WBC 7.3 6.2  HGB 15.0 13.9  HCT 47.2* 43.0  PLT 144* 141*   BMET Recent Labs    04/11/24 0110 04/12/24 0444  NA 138 134*  K 3.5 3.4*  CL 103 99  CO2 26 26  GLUCOSE 195* 152*  BUN <5* <5*  CREATININE 0.72 0.73  CALCIUM  9.1 8.7*   PT/INR No results for input(s): LABPROT, INR in the last 72 hours. CMP     Component Value Date/Time   NA 134 (L) 04/12/2024 0444   K 3.4 (L) 04/12/2024 0444   CL 99 04/12/2024 0444   CO2 26 04/12/2024 0444   GLUCOSE 152 (H) 04/12/2024 0444   BUN <5 (L) 04/12/2024 0444   CREATININE 0.73 04/12/2024 0444   CALCIUM  8.7 (L) 04/12/2024 0444   PROT 7.3 04/11/2024 0110   ALBUMIN 3.0 (L) 04/12/2024 0444   AST 11 (L) 04/11/2024 0110   ALT 5 04/11/2024 0110   ALKPHOS 81 04/11/2024 0110   BILITOT 0.7 04/11/2024 0110   GFRNONAA >60 04/12/2024 0444   GFRAA >60 04/30/2019 0918   Lipase     Component Value Date/Time   LIPASE 28 04/10/2024 1548       Studies/Results: CT ABDOMEN PELVIS W CONTRAST Result Date: 04/10/2024 EXAM: CT ABDOMEN AND PELVIS WITH CONTRAST  04/10/2024 05:13:38 PM TECHNIQUE: CT of the abdomen and pelvis was performed with the administration of 125 mL of iohexol  (OMNIPAQUE ) 300 MG/ML solution. Multiplanar reformatted images are provided for review. Automated exposure control, iterative reconstruction, and/or weight-based adjustment of the mA/kV was utilized to reduce the radiation dose to as low as reasonably achievable. COMPARISON: CT abdomen and pelvis 03/25/2024. CLINICAL HISTORY: Diverticulitis, complication suspected. FINDINGS: LOWER CHEST: No acute abnormality. LIVER: The liver is unremarkable. GALLBLADDER AND BILE DUCTS: Gallbladder is unremarkable. No biliary ductal dilatation. SPLEEN: No acute abnormality. PANCREAS: No acute abnormality. ADRENAL GLANDS: No acute abnormality. KIDNEYS, URETERS AND BLADDER: No stones in the kidneys or ureters. No hydronephrosis. No perinephric or periureteral stranding. Urinary bladder is unremarkable. GI AND BOWEL: Persistent mid distal sigmoid diverticulitis with mild bowel thickening and pericolonic fat stranding superiorly. Microperforation is noted and best visualized on coronal images (8:111). There is no bowel obstruction. PERITONEUM AND RETROPERITONEUM: Interval development of a fluid and gas collection within the mid abdomen superior to the mid sigmoid colon (8:11). No free air. VASCULATURE: Aorta is normal in caliber. LYMPH NODES: No lymphadenopathy. REPRODUCTIVE ORGANS: No acute abnormality. BONES AND SOFT TISSUES: No acute osseous abnormality. No focal soft tissue abnormality. Intervertebral disc space vacuum phenomenon. Grade 1 anterolisthesis of  L4 on L5. Grade 1 anterolisthesis of L3 on L4. To a superior endplate concavity likely old healed fracture. IMPRESSION: 1. Acute complicated diverticulitis of the mid-distal sigmoid colon with microperforation and associated 5.4 x 6.1 x 9.2cm gas and fluid collection formation. Recommend colonoscopy status post treatment and status post complete resolution of  inflammatory changes to exclude an underlying lesion. Electronically signed by: Morgane Naveau MD 04/10/2024 05:45 PM EST RP Workstation: [id number]   DG Chest Portable 1 View Result Date: 04/10/2024 EXAM: 1 VIEW(S) XRAY OF THE CHEST 04/10/2024 03:30:00 PM COMPARISON: Comparison 03/22/2024. Stable cardiomediastinal silhouette. CLINICAL HISTORY: cp cp FINDINGS: LUNGS AND PLEURA: No focal pulmonary opacity. No pleural effusion. No pneumothorax. HEART AND MEDIASTINUM: Stable cardiomediastinal silhouette. BONES AND SOFT TISSUES: No acute osseous abnormality. IMPRESSION: 1. No acute cardiopulmonary disease. Electronically signed by: Lynwood Seip MD 04/10/2024 03:39 PM EST RP Workstation: [id number]    Anti-infectives: Anti-infectives (From admission, onward)    Start     Dose/Rate Route Frequency Ordered Stop   04/10/24 1945  piperacillin -tazobactam (ZOSYN ) IVPB 3.375 g        3.375 g 12.5 mL/hr over 240 Minutes Intravenous Every 8 hours 04/10/24 1933     04/10/24 1800  cefTRIAXone  (ROCEPHIN ) 1 g in sodium chloride  0.9 % 100 mL IVPB        1 g 200 mL/hr over 30 Minutes Intravenous  Once 04/10/24 1755 04/10/24 1903   04/10/24 1800  metroNIDAZOLE  (FLAGYL ) IVPB 500 mg        500 mg 100 mL/hr over 60 Minutes Intravenous  Once 04/10/24 1755 04/10/24 1957        Assessment/Plan Diverticulitis with microperforation with abscess -patient states she is feeling a bit better today, but RN states she is still using quite a bit of dilaudid  -no nausea or vomiting.  Tolerating CLD well and wanting food -will allow FLD today -cont IV abx -WBC normal, AF  FEN - FLD VTE - heparin  ID - zosyn   I reviewed hospitalist notes, last 24 h vitals and pain scores, last 48 h intake and output, last 24 h labs and trends, and last 24 h imaging results.   LOS: 2 days    Burnard FORBES Banter , [facility] Surgery 04/12/2024, 12:08 PM Please see Amion for pager number during day hours 7:00am-4:30pm or  7:00am -11:30am on weekends

## 2024-04-12 NOTE — Progress Notes (Signed)
 PROGRESS NOTE  Lisa Ibarra FMW:[id number] DOB: [birth year]   PCP: Cesario Mutton, MD  Patient is from: Home.  DOA: 04/10/2024 LOS: 2  Chief complaints Chief Complaint  Patient presents with   Chest Pain     Brief Narrative / Interim history: 63 year old F with PMH of COPD, HFpEF, DM-2, chronic pain, HTN, tobacco use disorder and recent hospitalization for sigmoid diverticulitis from 11/7-11/11 for which she was managed conservatively with bowel rest and antibiotics, returning with worsening abdominal pain with intermittent nausea and vomiting, and admitted with acute complicated sigmoid diverticulitis with microperforation and possible abscess.  In ED, slightly hypertensive.  Otherwise stable.  No leukocytosis.  Basic labs including CMP and CBC without significant finding.  Lipase 28.  CXR without acute finding.  KG NSR and LAD.  CT abdomen and pelvis showed acute complicated diverticulitis of the mid distal sigmoid colon with microperforation and associated 5.4 x 6.1 x 9.2 gas and fluid collections.  Started on IV antibiotics.  General surgery consulted.  The next day, started on clear liquid diet and continued on IV Zosyn ..   Subjective: Seen and examined earlier this morning.  No major events overnight or this morning.  Reports feeling better but continues to endorse significant pain although she does not appear to be in pain at all.  Nuys nausea or vomiting.  Assessment and plan: Acute complicated sigmoid diverticulitis with microperforation and possible abscess-recently admitted from 11/7-11/11 for which she was managed conservatively with bowel rest and antibiotics.  Returns with worsening pain with intermittent nausea and vomiting.  CT abdomen and pelvis as above.  No leukocytosis or fever.  Hemodynamically stable.  Continues to endorse pain although she is barely tender on exam.  Does not appear to be in any distress either.  Blood cultures NGTD. - Continue IV Zosyn  - General  Surgery on board-on CLD.  Might be able to advance - Multimodal pain control with as needed Tylenol , oxycodone  and Dilaudid  - Counseled on the adverse effect of opiates and encouraged to minimize usage  Chronic HFpEF: TTE on 10/30 with LVEF of 55 to 60%, G1DD.  No cardiopulmonary symptoms.  On Lasix  20 to 40 mg daily as needed at home.  Appears euvolemic on exam. - Hold home Lasix  for now - Continue home Toprol -XL and losartan  - Monitor fluid status closely.  Chronic COPD: Stable. - Continue home bronchodilators/nebulizers.  NIDDM-2 with hyperglycemia: A1c 7.0% on 10/30.  Seems to be on Ozempic at home. Recent Labs  Lab 04/11/24 0735 04/11/24 1111 04/11/24 1531 04/11/24 2040 04/12/24 0713  GLUCAP 187* 238* 197* 175* 180*  - Continue SSI-moderate - Increase NovoLog  3 units 3 times daily with meals - Add Semglee  10 units daily  Essential hypertension: BP slightly elevated -Increase losartan  to 50 mg daily -Continue home Toprol -XL  Tobacco use disorder -Courage cessation. - Nicotine  patch  Hypokalemia/hypomagnesemia -Monitor replenish K and Mg as appropriate  Hyponatremia: Mild - Continue monitoring   There is no height or weight on file to calculate BMI.           DVT prophylaxis:  heparin  injection 5,000 Units Start: 04/11/24 0600  Code Status: Full code Family Communication: None at bedside Level of care: Med-Surg Status is: Inpatient Remains inpatient appropriate because: Acute complicated diverticulitis with microperforation and possible abscess   Final disposition: Home   55 minutes with more than 50% spent in reviewing records, counseling patient/family and coordinating care.  Consultants:  General Surgery  Procedures: None  Microbiology summarized: Blood  cultures NGTD  Objective: Vitals:   04/11/24 1922 04/11/24 2043 04/12/24 0610 04/12/24 0845  BP:  (!) 157/99 (!) 149/85   Pulse:  75 78   Resp:  16 16   Temp:  98.4 F (36.9 C) 97.8  F (36.6 C)   TempSrc:      SpO2: 98% 95% 93% 92%    Examination:  GENERAL: No apparent distress.  Nontoxic. HEENT: MMM.  Vision and hearing grossly intact.  NECK: Supple.  No apparent JVD.  RESP:  No IWOB.  Fair aeration bilaterally. CVS:  RRR. Heart sounds normal.  ABD/GI/GU: BS+. Abd soft.  Mild tenderness with deep palpation in RLQ.  No rebound or guarding. MSK/EXT:  Moves extremities. No apparent deformity. No edema.  SKIN: no apparent skin lesion or wound NEURO: AA.  Oriented appropriately.  No apparent focal neuro deficit. PSYCH: Tearful about not having something to drink or eat.  Sch Meds:  Scheduled Meds:  arformoterol   15 mcg Nebulization BID   atorvastatin   20 mg Oral Daily   budesonide   0.5 mg Nebulization BID   heparin   5,000 Units Subcutaneous Q8H   insulin  aspart  0-15 Units Subcutaneous TID WC   insulin  aspart  0-5 Units Subcutaneous QHS   losartan   50 mg Oral Daily   metoprolol  succinate  12.5 mg Oral QHS   revefenacin   175 mcg Nebulization Daily   Continuous Infusions:  magnesium  sulfate bolus IVPB 2 g (04/12/24 1022)   piperacillin -tazobactam (ZOSYN )  IV 12.5 mL/hr at 04/12/24 0504   PRN Meds:.acetaminophen , albuterol , hydrALAZINE , HYDROmorphone  (DILAUDID ) injection, lip balm, ondansetron  **OR** ondansetron  (ZOFRAN ) IV, oxyCODONE , simethicone   Antimicrobials: Anti-infectives (From admission, onward)    Start     Dose/Rate Route Frequency Ordered Stop   04/10/24 1945  piperacillin -tazobactam (ZOSYN ) IVPB 3.375 g        3.375 g 12.5 mL/hr over 240 Minutes Intravenous Every 8 hours 04/10/24 1933     04/10/24 1800  cefTRIAXone  (ROCEPHIN ) 1 g in sodium chloride  0.9 % 100 mL IVPB        1 g 200 mL/hr over 30 Minutes Intravenous  Once 04/10/24 1755 04/10/24 1903   04/10/24 1800  metroNIDAZOLE  (FLAGYL ) IVPB 500 mg        500 mg 100 mL/hr over 60 Minutes Intravenous  Once 04/10/24 1755 04/10/24 1957        I have personally reviewed the following labs  and images: CBC: Recent Labs  Lab 04/10/24 1548 04/11/24 0110 04/12/24 0444  WBC 7.0 7.3 6.2  NEUTROABS  --  5.3  --   HGB 15.7* 15.0 13.9  HCT 47.1* 47.2* 43.0  MCV 95.2 98.7 98.4  PLT 157 144* 141*   BMP &GFR Recent Labs  Lab 04/10/24 1548 04/11/24 0110 04/12/24 0444  NA 136 138 134*  K 3.6 3.5 3.4*  CL 100 103 99  CO2 25 26 26   GLUCOSE 279* 195* 152*  BUN 5* <5* <5*  CREATININE 0.64 0.72 0.73  CALCIUM  9.2 9.1 8.7*  MG  --   --  1.4*  PHOS  --   --  2.6   CrCl cannot be calculated (Unknown ideal weight.). Liver & Pancreas: Recent Labs  Lab 04/10/24 1548 04/11/24 0110 04/12/24 0444  AST 11* 11*  --   ALT <5 5  --   ALKPHOS 87 81  --   BILITOT 0.7 0.7  --   PROT 7.5 7.3  --   ALBUMIN 3.3* 3.0* 3.0*   Recent Labs  Lab 04/10/24 1548  LIPASE 28   No results for input(s): AMMONIA in the last 168 hours. Diabetic: No results for input(s): HGBA1C in the last 72 hours. Recent Labs  Lab 04/11/24 0735 04/11/24 1111 04/11/24 1531 04/11/24 2040 04/12/24 0713  GLUCAP 187* 238* 197* 175* 180*   Cardiac Enzymes: No results for input(s): CKTOTAL, CKMB, CKMBINDEX, TROPONINI in the last 168 hours. Recent Labs    03/21/24 1310 03/25/24 1938  PROBNP 2,893.0* 646.0*   Coagulation Profile: No results for input(s): INR, PROTIME in the last 168 hours. Thyroid Function Tests: No results for input(s): TSH, T4TOTAL, FREET4, T3FREE, THYROIDAB in the last 72 hours. Lipid Profile: No results for input(s): CHOL, HDL, LDLCALC, TRIG, CHOLHDL, LDLDIRECT in the last 72 hours. Anemia Panel: No results for input(s): VITAMINB12, FOLATE, FERRITIN, TIBC, IRON, RETICCTPCT in the last 72 hours. Urine analysis:    Component Value Date/Time   COLORURINE STRAW (A) 03/22/2024 1234   APPEARANCEUR CLEAR 03/22/2024 1234   LABSPEC 1.006 03/22/2024 1234   PHURINE 5.0 03/22/2024 1234   GLUCOSEU NEGATIVE 03/22/2024 1234   HGBUR SMALL  (A) 03/22/2024 1234   BILIRUBINUR NEGATIVE 03/22/2024 1234   KETONESUR NEGATIVE 03/22/2024 1234   PROTEINUR NEGATIVE 03/22/2024 1234   UROBILINOGEN 1.0 04/01/2015 0948   NITRITE NEGATIVE 03/22/2024 1234   LEUKOCYTESUR NEGATIVE 03/22/2024 1234   Sepsis Labs: Invalid input(s): PROCALCITONIN, LACTICIDVEN  Microbiology: Recent Results (from the past 240 hours)  Culture, blood (routine x 2)     Status: None (Preliminary result)   Collection Time: 04/10/24  3:48 PM   Specimen: Right Antecubital; Blood  Result Value Ref Range Status   Specimen Description   Final    RIGHT ANTECUBITAL Performed at [facility], [address] [place], [place] [address]    Special Requests   Final    BOTTLES DRAWN AEROBIC AND ANAEROBIC Blood Culture adequate volume Performed at [facility], [address] [place], [place] [address]    Culture   Final    NO GROWTH 2 DAYS Performed at [facility], [address] [place], [place] [address]    Report Status PENDING  Incomplete  Culture, blood (routine x 2)     Status: None (Preliminary result)   Collection Time: 04/11/24  1:10 AM   Specimen: BLOOD LEFT ARM  Result Value Ref Range Status   Specimen Description   Final    BLOOD LEFT ARM Performed at [facility], [address] [place], [place] [address]    Special Requests   Final    BOTTLES DRAWN AEROBIC AND ANAEROBIC Blood Culture results may not be optimal due to an inadequate volume of blood received in culture bottles Performed at [facility], [address], [place], [place] [address]    Culture   Final    NO GROWTH 1 DAY Performed at [facility], [address], [place], [place] [address]    Report Status PENDING  Incomplete    Radiology Studies: No results found.     Carletha Dawn T. Nialah Saravia [facility]  If 7PM-7AM, please contact night-coverage [email or web address] 04/12/2024, 10:34 AM

## 2024-04-13 DIAGNOSIS — K5792 Diverticulitis of intestine, part unspecified, without perforation or abscess without bleeding: Secondary | ICD-10-CM | POA: Diagnosis not present

## 2024-04-13 DIAGNOSIS — K5720 Diverticulitis of large intestine with perforation and abscess without bleeding: Secondary | ICD-10-CM | POA: Diagnosis not present

## 2024-04-13 LAB — GLUCOSE, CAPILLARY
Glucose-Capillary: 173 mg/dL — ABNORMAL HIGH (ref 70–99)
Glucose-Capillary: 225 mg/dL — ABNORMAL HIGH (ref 70–99)
Glucose-Capillary: 183 mg/dL — ABNORMAL HIGH (ref 70–99)
Glucose-Capillary: 231 mg/dL — ABNORMAL HIGH (ref 70–99)

## 2024-04-13 LAB — MAGNESIUM: Magnesium: 1.8 mg/dL (ref 1.7–2.4)

## 2024-04-13 LAB — CBC
HCT: 44.1 % (ref 36.0–46.0)
Hemoglobin: 14.5 g/dL (ref 12.0–15.0)
MCH: 31.7 pg (ref 26.0–34.0)
MCHC: 32.9 g/dL (ref 30.0–36.0)
MCV: 96.3 fL (ref 80.0–100.0)
Platelets: 145 K/uL — ABNORMAL LOW (ref 150–400)
RBC: 4.58 MIL/uL (ref 3.87–5.11)
RDW: 12.9 % (ref 11.5–15.5)
WBC: 5.4 K/uL (ref 4.0–10.5)
nRBC: 0 % (ref 0.0–0.2)

## 2024-04-13 LAB — RENAL FUNCTION PANEL
Albumin: 3 g/dL — ABNORMAL LOW (ref 3.5–5.0)
Anion gap: 10 (ref 5–15)
BUN: <5 mg/dL — ABNORMAL LOW (ref 8–23)
CO2: 24 mmol/L (ref 22–32)
Calcium: 9.1 mg/dL (ref 8.9–10.3)
Chloride: 102 mmol/L (ref 98–111)
Creatinine, Ser: 0.74 mg/dL (ref 0.44–1.00)
GFR, Estimated: >60 mL/min (ref >60)
Glucose, Bld: 145 mg/dL — ABNORMAL HIGH (ref 70–99)
Phosphorus: 2.4 mg/dL — ABNORMAL LOW (ref 2.5–4.6)
Potassium: 4 mmol/L (ref 3.5–5.1)
Sodium: 136 mmol/L (ref 135–145)

## 2024-04-13 MED ORDER — INSULIN GLARGINE-YFGN 100 UNIT/ML ~~LOC~~ SOLN
15.0000 [IU] | Freq: Every day | SUBCUTANEOUS | Status: DC
  Administered 2024-04-14 – 2024-04-17 (×4): 15 [IU] via SUBCUTANEOUS
  Filled 2024-04-13 (×4): qty 0.15

## 2024-04-13 MED ORDER — INSULIN ASPART 100 UNIT/ML IJ SOLN
4.0000 [IU] | Freq: Three times a day (TID) | INTRAMUSCULAR | Status: DC
  Administered 2024-04-13 – 2024-04-17 (×11): 4 [IU] via SUBCUTANEOUS
  Filled 2024-04-13 (×11): qty 4

## 2024-04-13 NOTE — Progress Notes (Signed)
 PROGRESS NOTE  Lisa Ibarra FMW:[id number] DOB: 1961/01/23   PCP: Cesario Mutton, MD  Patient is from: Home.  DOA: 04/10/2024 LOS: 3  Chief complaints Chief Complaint  Patient presents with   Chest Pain     Brief Narrative / Interim history: 63 year old F with PMH of COPD, HFpEF, DM-2, chronic pain, HTN, tobacco use disorder and recent hospitalization for sigmoid diverticulitis from 11/7-11/11 for which she was managed conservatively with bowel rest and antibiotics, returning with worsening abdominal pain with intermittent nausea and vomiting, and admitted with acute complicated sigmoid diverticulitis with microperforation and possible abscess.  In ED, slightly hypertensive.  Otherwise stable.  No leukocytosis.  Basic labs including CMP and CBC without significant finding.  Lipase 28.  CXR without acute finding.  KG NSR and LAD.  CT abdomen and pelvis showed acute complicated diverticulitis of the mid distal sigmoid colon with microperforation and associated 5.4 x 6.1 x 9.2 gas and fluid collections.  Started on IV antibiotics.  General surgery consulted.  The next day, started on clear liquid diet and continued on IV Zosyn ..   Subjective: Seen and examined earlier this morning.  No major events overnight or this morning.  Reports significant improvement in her pain.  Rates her pain 4/10 today.  She states she has been trying to avoid opiates after we discussed risks and benefits.  No nausea or vomiting.  Tolerated full liquid diet.  Assessment and plan: Acute complicated sigmoid diverticulitis with microperforation and possible abscess-recently admitted from 11/7-11/11 for which she was managed conservatively with bowel rest and antibiotics.  Returns with worsening pain with intermittent nausea and vomiting.  CT abdomen and pelvis as above.  No leukocytosis or fever.  Hemodynamically stable.  Symptoms improved significantly.  Blood cultures NGTD. -General surgery on board-advance to  soft -Continue IV Zosyn  -Multimodal pain control with as needed Tylenol , oxycodone  and Dilaudid .  Patient is cautiously using  Chronic HFpEF: TTE on 10/30 with LVEF of 55 to 60%, G1DD.  No cardiopulmonary symptoms.  On Lasix  20 to 40 mg daily as needed at home.  Appears euvolemic on exam. - Continue holding Lasix  for now. - Continue home Toprol -XL.  Increased home losartan  for BP.   - Monitor fluid status closely.  Chronic COPD: Stable. - Continue home bronchodilators/nebulizers.  NIDDM-2 with hyperglycemia: A1c 7.0% on 10/30.  Seems to be on Ozempic at home. Recent Labs  Lab 04/12/24 0713 04/12/24 1118 04/12/24 1549 04/12/24 2140 04/13/24 0805  GLUCAP 180* 165* 172* 113* 173*  - Continue SSI-moderate - Increase NovoLog  from 3 to 4 units 3 times daily with meals - Increase Semglee  from 10 to 15 units starting tomorrow  Essential hypertension: BP slightly elevated but improved. -Increase losartan  to 50 mg daily on 11/21 -Continue home Toprol -XL  Tobacco use disorder -Courage cessation. -Nicotine  patch  Hypokalemia/hypomagnesemia -Monitor replenish K and Mg as appropriate  Hyponatremia: Mild and resolved.   There is no height or weight on file to calculate BMI.           DVT prophylaxis:  heparin  injection 5,000 Units Start: 04/11/24 0600  Code Status: Full code Family Communication: None at bedside Level of care: Med-Surg Status is: Inpatient Remains inpatient appropriate because: Acute complicated diverticulitis with microperforation and possible abscess   Final disposition: Home   35 minutes with more than 50% spent in reviewing records, counseling patient/family and coordinating care.  Consultants:  General Surgery  Procedures: None  Microbiology summarized: Blood cultures NGTD  Objective: Vitals:  04/12/24 2117 04/12/24 2120 04/13/24 0621 04/13/24 0744  BP:   (!) 146/81   Pulse:   84   Resp:   18   Temp:   97.9 F (36.6 C)   TempSrc:    Oral   SpO2: 90% 90% 92% 93%    Examination:  GENERAL: No apparent distress.  Nontoxic. HEENT: MMM.  Vision and hearing grossly intact.  NECK: Supple.  No apparent JVD.  RESP:  No IWOB.  Fair aeration bilaterally. CVS:  RRR. Heart sounds normal.  ABD/GI/GU: BS+. Abd soft.  Mild tenderness with deep palpation in RLQ.  No rebound or guarding. MSK/EXT:  Moves extremities. No apparent deformity. No edema.  SKIN: no apparent skin lesion or wound NEURO: AA.  Oriented appropriately.  No apparent focal neuro deficit. PSYCH: Tearful about not having something to drink or eat.  Sch Meds:  Scheduled Meds:  arformoterol   15 mcg Nebulization BID   atorvastatin   20 mg Oral Daily   budesonide   0.5 mg Nebulization BID   heparin   5,000 Units Subcutaneous Q8H   insulin  aspart  0-15 Units Subcutaneous TID WC   insulin  aspart  0-5 Units Subcutaneous QHS   insulin  aspart  3 Units Subcutaneous TID WC   insulin  glargine-yfgn  10 Units Subcutaneous Daily   losartan   50 mg Oral Daily   metoprolol  succinate  12.5 mg Oral QHS   revefenacin   175 mcg Nebulization Daily   Continuous Infusions:  piperacillin -tazobactam (ZOSYN )  IV 3.375 g (04/13/24 0451)   PRN Meds:.acetaminophen , albuterol , hydrALAZINE , HYDROmorphone  (DILAUDID ) injection, lip balm, ondansetron  **OR** ondansetron  (ZOFRAN ) IV, oxyCODONE , simethicone   Antimicrobials: Anti-infectives (From admission, onward)    Start     Dose/Rate Route Frequency Ordered Stop   04/10/24 1945  piperacillin -tazobactam (ZOSYN ) IVPB 3.375 g        3.375 g 12.5 mL/hr over 240 Minutes Intravenous Every 8 hours 04/10/24 1933     04/10/24 1800  cefTRIAXone  (ROCEPHIN ) 1 g in sodium chloride  0.9 % 100 mL IVPB        1 g 200 mL/hr over 30 Minutes Intravenous  Once 04/10/24 1755 04/10/24 1903   04/10/24 1800  metroNIDAZOLE  (FLAGYL ) IVPB 500 mg        500 mg 100 mL/hr over 60 Minutes Intravenous  Once 04/10/24 1755 04/10/24 1957        I have personally  reviewed the following labs and images: CBC: Recent Labs  Lab 04/10/24 1548 04/11/24 0110 04/12/24 0444 04/13/24 0516  WBC 7.0 7.3 6.2 5.4  NEUTROABS  --  5.3  --   --   HGB 15.7* 15.0 13.9 14.5  HCT 47.1* 47.2* 43.0 44.1  MCV 95.2 98.7 98.4 96.3  PLT 157 144* 141* 145*   BMP &GFR Recent Labs  Lab 04/10/24 1548 04/11/24 0110 04/12/24 0444 04/13/24 0516  NA 136 138 134* 136  K 3.6 3.5 3.4* 4.0  CL 100 103 99 102  CO2 25 26 26 24   GLUCOSE 279* 195* 152* 145*  BUN 5* <5* <5* <5*  CREATININE 0.64 0.72 0.73 0.74  CALCIUM  9.2 9.1 8.7* 9.1  MG  --   --  1.4* 1.8  PHOS  --   --  2.6 2.4*   CrCl cannot be calculated (Unknown ideal weight.). Liver & Pancreas: Recent Labs  Lab 04/10/24 1548 04/11/24 0110 04/12/24 0444 04/13/24 0516  AST 11* 11*  --   --   ALT <5 5  --   --   ALKPHOS 87  81  --   --   BILITOT 0.7 0.7  --   --   PROT 7.5 7.3  --   --   ALBUMIN 3.3* 3.0* 3.0* 3.0*   Recent Labs  Lab 04/10/24 1548  LIPASE 28   No results for input(s): AMMONIA in the last 168 hours. Diabetic: No results for input(s): HGBA1C in the last 72 hours. Recent Labs  Lab 04/12/24 0713 04/12/24 1118 04/12/24 1549 04/12/24 2140 04/13/24 0805  GLUCAP 180* 165* 172* 113* 173*   Cardiac Enzymes: No results for input(s): CKTOTAL, CKMB, CKMBINDEX, TROPONINI in the last 168 hours. Recent Labs    03/21/24 1310 03/25/24 1938  PROBNP 2,893.0* 646.0*   Coagulation Profile: No results for input(s): INR, PROTIME in the last 168 hours. Thyroid Function Tests: No results for input(s): TSH, T4TOTAL, FREET4, T3FREE, THYROIDAB in the last 72 hours. Lipid Profile: No results for input(s): CHOL, HDL, LDLCALC, TRIG, CHOLHDL, LDLDIRECT in the last 72 hours. Anemia Panel: No results for input(s): VITAMINB12, FOLATE, FERRITIN, TIBC, IRON, RETICCTPCT in the last 72 hours. Urine analysis:    Component Value Date/Time   COLORURINE STRAW  (A) 03/22/2024 1234   APPEARANCEUR CLEAR 03/22/2024 1234   LABSPEC 1.006 03/22/2024 1234   PHURINE 5.0 03/22/2024 1234   GLUCOSEU NEGATIVE 03/22/2024 1234   HGBUR SMALL (A) 03/22/2024 1234   BILIRUBINUR NEGATIVE 03/22/2024 1234   KETONESUR NEGATIVE 03/22/2024 1234   PROTEINUR NEGATIVE 03/22/2024 1234   UROBILINOGEN 1.0 04/01/2015 0948   NITRITE NEGATIVE 03/22/2024 1234   LEUKOCYTESUR NEGATIVE 03/22/2024 1234   Sepsis Labs: Invalid input(s): PROCALCITONIN, LACTICIDVEN  Microbiology: Recent Results (from the past 240 hours)  Culture, blood (routine x 2)     Status: None (Preliminary result)   Collection Time: 04/10/24  3:48 PM   Specimen: Right Antecubital; Blood  Result Value Ref Range Status   Specimen Description   Final    RIGHT ANTECUBITAL Performed at [facility], [address] [place], [place] [address]    Special Requests   Final    BOTTLES DRAWN AEROBIC AND ANAEROBIC Blood Culture adequate volume Performed at [facility], [address] [place], [place] [address]    Culture   Final    NO GROWTH 3 DAYS Performed at [facility], [address], [place], [place] [address]    Report Status PENDING  Incomplete  Culture, blood (routine x 2)     Status: None (Preliminary result)   Collection Time: 04/11/24  1:10 AM   Specimen: BLOOD LEFT ARM  Result Value Ref Range Status   Specimen Description   Final    BLOOD LEFT ARM Performed at [facility], [address] [place], [place] [address]    Special Requests   Final    BOTTLES DRAWN AEROBIC AND ANAEROBIC Blood Culture results may not be optimal due to an inadequate volume of blood received in culture bottles Performed at [facility], [address], [place], [place] [address]    Culture   Final    NO GROWTH 2 DAYS Performed at [facility], [address] [place], [place] [address]    Report Status PENDING  Incomplete    Radiology Studies: No  results found.     Gema Ringold T. Kacelyn Rowzee [facility]  If 7PM-7AM, please contact night-coverage [email or web address] 04/13/2024, 10:47 AM

## 2024-04-13 NOTE — Plan of Care (Signed)
  Problem: Coping: Goal: Ability to adjust to condition or change in health will improve Outcome: Progressing   Problem: Fluid Volume: Goal: Ability to maintain a balanced intake and output will improve Outcome: Progressing   Problem: Metabolic: Goal: Ability to maintain appropriate glucose levels will improve Outcome: Progressing   Problem: Nutritional: Goal: Maintenance of adequate nutrition will improve Outcome: Progressing   Problem: Activity: Goal: Risk for activity intolerance will decrease Outcome: Progressing

## 2024-04-13 NOTE — Progress Notes (Signed)
 Patient ID: Lisa Ibarra, female   DOB: 1961-05-02, 63 y.o.   MRN: [id number]   Acute Care Surgery Service Progress Note:    Chief Complaint/Subjective: Reports 2 episodes of pain since yesterday Drinking more Flatus but no BM   Objective: Vital signs in last 24 hours: Temp:  [97.9 F (36.6 C)-98.4 F (36.9 C)] 97.9 F (36.6 C) (11/22 0621) Pulse Rate:  [70-84] 84 (11/22 0621) Resp:  [16-18] 18 (11/22 0621) BP: (133-158)/(67-84) 146/81 (11/22 0621) SpO2:  [90 %-96 %] 93 % (11/22 0744) Last BM Date : 04/09/24  Intake/Output from previous day: 11/21 0701 - 11/22 0700 In: 2134.7 [P.O.:2030; IV Piggyback:104.7] Out: 950 [Urine:950] Intake/Output this shift: No intake/output data recorded.  Lungs:, nonlabored  Cardiovascular: reg  Abd: soft, obese, min to NT, no guarding/rebound  Extremities: no edema, +SCDs  Neuro: alert, nonfocal  Lab Results: CBC  Recent Labs    04/12/24 0444 04/13/24 0516  WBC 6.2 5.4  HGB 13.9 14.5  HCT 43.0 44.1  PLT 141* 145*   BMET Recent Labs    04/12/24 0444 04/13/24 0516  NA 134* 136  K 3.4* 4.0  CL 99 102  CO2 26 24  GLUCOSE 152* 145*  BUN <5* <5*  CREATININE 0.73 0.74  CALCIUM  8.7* 9.1   LFT    Latest Ref Rng & Units 04/13/2024    5:16 AM 04/12/2024    4:44 AM 04/11/2024    1:10 AM  Hepatic Function  Total Protein 6.5 - 8.1 g/dL   7.3   Albumin  3.5 - 5.0 g/dL 3.0  3.0  3.0   AST 15 - 41 U/L   11   ALT 0 - 44 U/L   5   Alk Phosphatase 38 - 126 U/L   81   Total Bilirubin 0.0 - 1.2 mg/dL   0.7    PT/INR No results for input(s): LABPROT, INR in the last 72 hours. ABG No results for input(s): PHART, HCO3 in the last 72 hours.  Invalid input(s): PCO2, PO2  Studies/Results:  Anti-infectives: Anti-infectives (From admission, onward)    Start     Dose/Rate Route Frequency Ordered Stop   04/10/24 1945  piperacillin -tazobactam (ZOSYN ) IVPB 3.375 g        3.375 g 12.5 mL/hr over 240 Minutes  Intravenous Every 8 hours 04/10/24 1933     04/10/24 1800  cefTRIAXone  (ROCEPHIN ) 1 g in sodium chloride  0.9 % 100 mL IVPB        1 g 200 mL/hr over 30 Minutes Intravenous  Once 04/10/24 1755 04/10/24 1903   04/10/24 1800  metroNIDAZOLE  (FLAGYL ) IVPB 500 mg        500 mg 100 mL/hr over 60 Minutes Intravenous  Once 04/10/24 1755 04/10/24 1957       Medications: Scheduled Meds:  arformoterol   15 mcg Nebulization BID   atorvastatin   20 mg Oral Daily   budesonide   0.5 mg Nebulization BID   heparin   5,000 Units Subcutaneous Q8H   insulin  aspart  0-15 Units Subcutaneous TID WC   insulin  aspart  0-5 Units Subcutaneous QHS   insulin  aspart  4 Units Subcutaneous TID WC   [START ON 04/14/2024] insulin  glargine-yfgn  15 Units Subcutaneous Daily   losartan   50 mg Oral Daily   metoprolol  succinate  12.5 mg Oral QHS   revefenacin   175 mcg Nebulization Daily   Continuous Infusions:  piperacillin -tazobactam (ZOSYN )  IV 3.375 g (04/13/24 0451)   PRN Meds:.acetaminophen , albuterol , hydrALAZINE , HYDROmorphone  (DILAUDID ) injection,  lip balm, ondansetron  **OR** ondansetron  (ZOFRAN ) IV, oxyCODONE , simethicone   Assessment/Plan: Patient Active Problem List   Diagnosis Date Noted   Diverticulitis 04/10/2024   Acute diverticulitis 03/26/2024   Generalized weakness 03/26/2024   Obesity, Class III, BMI 40-49.9 (morbid obesity) (HCC) 03/26/2024   Chronic diastolic CHF (congestive heart failure) (HCC) 03/26/2024   Asthma with COPD with exacerbation (HCC) 03/26/2024   COPD with acute exacerbation (HCC) 03/23/2024   Acute exacerbation of CHF (congestive heart failure) (HCC) 03/21/2024   COVID-19 08/18/2021   Hypokalemia 08/18/2021   Thrombocytopenia 08/18/2021   SIRS (systemic inflammatory response syndrome) (HCC) 08/18/2021   Uncontrolled diabetes mellitus 05/29/2012   Influenza A H1N1 infection 05/28/2012   Acute bronchitis 05/27/2012   Asthma exacerbation 05/26/2012   Diabetes mellitus (HCC)  05/26/2012   Diarrhea 05/26/2012   Essential hypertension 01/05/2007   Headache 01/05/2007   CERVICAL CANCER, HX OF 01/05/2007   Diverticulitis with microperforation with abscess -patient states she is feeling a bit better today, -no nausea or vomiting.  Tolerating FLD well and wanting food -will allow soft today -cont IV abx -WBC normal, AF -would like for pt to have BM prior to discharge -clinically improving  FEN - soft VTE - heparin  ID - zosyn    I reviewed hospitalist notes, last 24 h vitals and pain scores, last 48 h intake and output, last 24 h labs and trends, and last 24 h imaging results  Low MDM Disposition:  LOS: 3 days    Brittin Belnap M. Tanda, MD, FACS General, Bariatric, & Minimally Invasive Surgery [phone] [facility], [facility]

## 2024-04-14 ENCOUNTER — Inpatient Hospital Stay (HOSPITAL_COMMUNITY)

## 2024-04-14 DIAGNOSIS — K5792 Diverticulitis of intestine, part unspecified, without perforation or abscess without bleeding: Secondary | ICD-10-CM | POA: Diagnosis not present

## 2024-04-14 LAB — RENAL FUNCTION PANEL
Albumin: 3 g/dL — ABNORMAL LOW (ref 3.5–5.0)
Anion gap: 9 (ref 5–15)
BUN: <5 mg/dL — ABNORMAL LOW (ref 8–23)
CO2: 25 mmol/L (ref 22–32)
Calcium: 9.1 mg/dL (ref 8.9–10.3)
Chloride: 102 mmol/L (ref 98–111)
Creatinine, Ser: 0.71 mg/dL (ref 0.44–1.00)
GFR, Estimated: >60 mL/min (ref >60)
Glucose, Bld: 162 mg/dL — ABNORMAL HIGH (ref 70–99)
Phosphorus: 2.5 mg/dL (ref 2.5–4.6)
Potassium: 3.6 mmol/L (ref 3.5–5.1)
Sodium: 136 mmol/L (ref 135–145)

## 2024-04-14 LAB — GLUCOSE, CAPILLARY
Glucose-Capillary: 194 mg/dL — ABNORMAL HIGH (ref 70–99)
Glucose-Capillary: 169 mg/dL — ABNORMAL HIGH (ref 70–99)
Glucose-Capillary: 257 mg/dL — ABNORMAL HIGH (ref 70–99)
Glucose-Capillary: 199 mg/dL — ABNORMAL HIGH (ref 70–99)
Glucose-Capillary: 194 mg/dL — ABNORMAL HIGH (ref 70–99)

## 2024-04-14 LAB — MAGNESIUM: Magnesium: 1.7 mg/dL (ref 1.7–2.4)

## 2024-04-14 LAB — CBC
HCT: 43.8 % (ref 36.0–46.0)
Hemoglobin: 14.5 g/dL (ref 12.0–15.0)
MCH: 32 pg (ref 26.0–34.0)
MCHC: 33.1 g/dL (ref 30.0–36.0)
MCV: 96.7 fL (ref 80.0–100.0)
Platelets: 160 K/uL (ref 150–400)
RBC: 4.53 MIL/uL (ref 3.87–5.11)
RDW: 13 % (ref 11.5–15.5)
WBC: 5.7 K/uL (ref 4.0–10.5)
nRBC: 0 % (ref 0.0–0.2)

## 2024-04-14 MED ORDER — IOHEXOL 300 MG/ML  SOLN
100.0000 mL | Freq: Once | INTRAMUSCULAR | Status: AC | PRN
  Administered 2024-04-14: 100 mL via INTRAVENOUS

## 2024-04-14 MED ORDER — IOHEXOL 9 MG/ML PO SOLN
500.0000 mL | ORAL | Status: AC
  Administered 2024-04-14 (×2): 500 mL via ORAL

## 2024-04-14 NOTE — Progress Notes (Signed)
   Subjective/Chief Complaint: Pain with bms of which she has had several   Objective: Vital signs in last 24 hours: Temp:  [97.9 F (36.6 C)-98.3 F (36.8 C)] 97.9 F (36.6 C) (11/23 0531) Pulse Rate:  [80-86] 80 (11/23 0531) Resp:  [16-18] 16 (11/23 0531) BP: (148-173)/(68-80) 173/70 (11/23 0531) SpO2:  [94 %-100 %] 96 % (11/23 0729) Last BM Date : 04/14/24  Intake/Output from previous day: 11/22 0701 - 11/23 0700 In: 1491.8 [P.O.:1240; IV Piggyback:251.8] Out: -  Intake/Output this shift: No intake/output data recorded.  Ab soft mild tender llq  Lab Results:  Recent Labs    04/13/24 0516 04/14/24 0514  WBC 5.4 5.7  HGB 14.5 14.5  HCT 44.1 43.8  PLT 145* 160   BMET Recent Labs    04/13/24 0516 04/14/24 0514  NA 136 136  K 4.0 3.6  CL 102 102  CO2 24 25  GLUCOSE 145* 162*  BUN <5* <5*  CREATININE 0.74 0.71  CALCIUM  9.1 9.1   PT/INR No results for input(s): LABPROT, INR in the last 72 hours. ABG No results for input(s): PHART, HCO3 in the last 72 hours.  Invalid input(s): PCO2, PO2  Studies/Results: No results found.  Anti-infectives: Anti-infectives (From admission, onward)    Start     Dose/Rate Route Frequency Ordered Stop   04/10/24 1945  piperacillin -tazobactam (ZOSYN ) IVPB 3.375 g        3.375 g 12.5 mL/hr over 240 Minutes Intravenous Every 8 hours 04/10/24 1933     04/10/24 1800  cefTRIAXone  (ROCEPHIN ) 1 g in sodium chloride  0.9 % 100 mL IVPB        1 g 200 mL/hr over 30 Minutes Intravenous  Once 04/10/24 1755 04/10/24 1903   04/10/24 1800  metroNIDAZOLE  (FLAGYL ) IVPB 500 mg        500 mg 100 mL/hr over 60 Minutes Intravenous  Once 04/10/24 1755 04/10/24 1957       Assessment/Plan: Diverticulitis with microperforation with abscess -cont IV abx -WBC normal, AF -she still has some pain with bm- had an abscess that was over 5 cm initially and thought to be unable to be drained.  Will get another CT today to reevaluate  this prior to any discharge   FEN - soft VTE - heparin  ID - zosyn   I reviewed hospitalist notes, last 24 h vitals and pain scores, last 48 h intake and output, last 24 h labs and trends, and last 24 h imaging results.     Lisa Ibarra 04/14/2024

## 2024-04-14 NOTE — Progress Notes (Signed)
 Ct reviewed.  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  In discussion with radiology this was present on prior.   She does not have concern for bowel ischemia. Messaged [facility] with result and they are contacting hematology to discuss options.

## 2024-04-14 NOTE — Plan of Care (Signed)
  Problem: Coping: Goal: Ability to adjust to condition or change in health will improve Outcome: Progressing   Problem: Health Behavior/Discharge Planning: Goal: Ability to identify and utilize available resources and services will improve Outcome: Progressing Goal: Ability to manage health-related needs will improve Outcome: Progressing   Problem: Education: Goal: Knowledge of General Education information will improve Description: Including pain rating scale, medication(s)/side effects and non-pharmacologic comfort measures Outcome: Progressing

## 2024-04-14 NOTE — Progress Notes (Signed)
 PROGRESS NOTE  Lisa Ibarra FMW:[id number] DOB: [birth year]   PCP: Cesario Mutton, MD  Patient is from: Home.  DOA: 04/10/2024 LOS: 4  Chief complaints Chief Complaint  Patient presents with   Chest Pain     Brief Narrative / Interim history: As per prior documentation: 63 year old F with PMH of COPD, HFpEF, DM-2, chronic pain, HTN, tobacco use disorder and recent hospitalization for sigmoid diverticulitis from 11/7-11/11 for which she was managed conservatively with bowel rest and antibiotics, returning with worsening abdominal pain with intermittent nausea and vomiting, and admitted with acute complicated sigmoid diverticulitis with microperforation and possible abscess.  In ED, slightly hypertensive.  Otherwise stable.  No leukocytosis.  Basic labs including CMP and CBC without significant finding.  Lipase 28.  CXR without acute finding.  KG NSR and LAD.  CT abdomen and pelvis showed acute complicated diverticulitis of the mid distal sigmoid colon with microperforation and associated 5.4 x 6.1 x 9.2 gas and fluid collections.  Started on IV antibiotics.  General surgery consulted.  The next day, started on clear liquid diet and continued on IV Zosyn .  04/14/2024: Patient seen.  Patient continues to report lower abdominal pain.  Will consult hematology team for portal venous thrombosis.  CT scan of the abdomen and pelvis with contrast done today, 04/14/2024, revealed: 1. Acute, perforated diverticulitis involving the sigmoid colon with a poorly defined gas and debris collection in the central pelvis, grossly unchanged compared with the previous exam. No drainable abscess identified. 2. 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 3. Several small foci of gas identified within the portal venous system 4. Results were called to Dr. Clorinda Bury at the time of  interpretation on 04/14/2024 at 12:38 pm.     Subjective: -Patient continues to report lower abdominal pain.    Assessment and plan: Acute complicated sigmoid diverticulitis with microperforation and possible abscess-recently admitted from 11/7-11/11 for which she was managed conservatively with bowel rest and antibiotics.  Returns with worsening pain with intermittent nausea and vomiting.  CT abdomen and pelvis as above.  No leukocytosis or fever.  Hemodynamically stable.  Symptoms improved significantly.  Blood cultures NGTD. -General surgery on board-advance to soft -Continue IV Zosyn  -Multimodal pain control with as needed Tylenol , oxycodone  and Dilaudid .  Patient is cautiously using 04/14/2024: Continue antibiotics.  CT scan of abdomen and pelvis with contrast as documented above.  Surgical team is directing care.  Chronic HFpEF: TTE on 10/30 with LVEF of 55 to 60%, G1DD.  No cardiopulmonary symptoms.  On Lasix  20 to 40 mg daily as needed at home.  Appears euvolemic on exam. - Continue holding Lasix  for now. - Continue home Toprol -XL.  Increased home losartan  for BP.   - Monitor fluid status closely. 04/14/2024: Stable.  Chronic COPD: Stable. - Continue home bronchodilators/nebulizers.  NIDDM-2 with hyperglycemia: - A1c 7.0% on 10/30.   - Continue SSI-moderate  Essential hypertension:  -Blood pressure is reasonably controlled.   - Continue losartan  and Toprol -XL.    Tobacco use disorder -Courage cessation. -Nicotine  patch  Hypokalemia/hypomagnesemia - Potassium of 3.6 and magnesium  of 1.7 today.  Hyponatremia:  -Resolved. - Sodium of 136 today.    There is no height or weight on file to calculate BMI.           DVT prophylaxis:  heparin  injection 5,000 Units Start: 04/11/24 0600  Code Status: Full  code Family Communication: None at bedside Level of care: Med-Surg Status is: Inpatient Remains inpatient appropriate because: Acute complicated diverticulitis  with microperforation and possible abscess   Final disposition: Home   35 minutes with more than 50% spent in reviewing records, counseling patient/family and coordinating care.  Consultants:  General Surgery  Procedures: None  Microbiology summarized: Blood cultures NGTD  Objective: Vitals:   04/13/24 2042 04/13/24 2046 04/14/24 0531 04/14/24 0729  BP:   (!) 173/70   Pulse:   80   Resp:   16   Temp:   97.9 F (36.6 C)   TempSrc:   Oral   SpO2: 94% 100% 97% 96%    Examination:  GENERAL: Not in any distress.  Obese abdomen. HEENT: No pallor or jaundice.  NECK: Supple.   RESP: Clear to auscultation. CVS: S1-S2.  ABD/GI/GU: BS+. Abd is obese, soft and tender lower abdominal area.   NEURO: AA.  Oriented appropriately.    Sch Meds:  Scheduled Meds:  arformoterol   15 mcg Nebulization BID   atorvastatin   20 mg Oral Daily   budesonide   0.5 mg Nebulization BID   heparin   5,000 Units Subcutaneous Q8H   insulin  aspart  0-15 Units Subcutaneous TID WC   insulin  aspart  0-5 Units Subcutaneous QHS   insulin  aspart  4 Units Subcutaneous TID WC   insulin  glargine-yfgn  15 Units Subcutaneous Daily   iohexol   500 mL Oral Q1H   losartan   50 mg Oral Daily   metoprolol  succinate  12.5 mg Oral QHS   revefenacin   175 mcg Nebulization Daily   Continuous Infusions:  piperacillin -tazobactam (ZOSYN )  IV 3.375 g (04/14/24 0551)   PRN Meds:.acetaminophen , albuterol , hydrALAZINE , HYDROmorphone  (DILAUDID ) injection, lip balm, ondansetron  **OR** ondansetron  (ZOFRAN ) IV, oxyCODONE , simethicone   Antimicrobials: Anti-infectives (From admission, onward)    Start     Dose/Rate Route Frequency Ordered Stop   04/10/24 1945  piperacillin -tazobactam (ZOSYN ) IVPB 3.375 g        3.375 g 12.5 mL/hr over 240 Minutes Intravenous Every 8 hours 04/10/24 1933     04/10/24 1800  cefTRIAXone  (ROCEPHIN ) 1 g in sodium chloride  0.9 % 100 mL IVPB        1 g 200 mL/hr over 30 Minutes Intravenous  Once  04/10/24 1755 04/10/24 1903   04/10/24 1800  metroNIDAZOLE  (FLAGYL ) IVPB 500 mg        500 mg 100 mL/hr over 60 Minutes Intravenous  Once 04/10/24 1755 04/10/24 1957        I have personally reviewed the following labs and images: CBC: Recent Labs  Lab 04/10/24 1548 04/11/24 0110 04/12/24 0444 04/13/24 0516 04/14/24 0514  WBC 7.0 7.3 6.2 5.4 5.7  NEUTROABS  --  5.3  --   --   --   HGB 15.7* 15.0 13.9 14.5 14.5  HCT 47.1* 47.2* 43.0 44.1 43.8  MCV 95.2 98.7 98.4 96.3 96.7  PLT 157 144* 141* 145* 160   BMP &GFR Recent Labs  Lab 04/10/24 1548 04/11/24 0110 04/12/24 0444 04/13/24 0516 04/14/24 0514  NA 136 138 134* 136 136  K 3.6 3.5 3.4* 4.0 3.6  CL 100 103 99 102 102  CO2 25 26 26 24 25   GLUCOSE 279* 195* 152* 145* 162*  BUN 5* <5* <5* <5* <5*  CREATININE 0.64 0.72 0.73 0.74 0.71  CALCIUM  9.2 9.1 8.7* 9.1 9.1  MG  --   --  1.4* 1.8 1.7  PHOS  --   --  2.6  2.4* 2.5   CrCl cannot be calculated (Unknown ideal weight.). Liver & Pancreas: Recent Labs  Lab 04/10/24 1548 04/11/24 0110 04/12/24 0444 04/13/24 0516 04/14/24 0514  AST 11* 11*  --   --   --   ALT <5 5  --   --   --   ALKPHOS 87 81  --   --   --   BILITOT 0.7 0.7  --   --   --   PROT 7.5 7.3  --   --   --   ALBUMIN 3.3* 3.0* 3.0* 3.0* 3.0*   Recent Labs  Lab 04/10/24 1548  LIPASE 28   No results for input(s): AMMONIA in the last 168 hours. Diabetic: No results for input(s): HGBA1C in the last 72 hours. Recent Labs  Lab 04/13/24 0805 04/13/24 1126 04/13/24 1654 04/13/24 2112 04/14/24 0751  GLUCAP 173* 225* 183* 231* 194*   Cardiac Enzymes: No results for input(s): CKTOTAL, CKMB, CKMBINDEX, TROPONINI in the last 168 hours. Recent Labs    03/21/24 1310 03/25/24 1938  PROBNP 2,893.0* 646.0*   Coagulation Profile: No results for input(s): INR, PROTIME in the last 168 hours. Thyroid Function Tests: No results for input(s): TSH, T4TOTAL, FREET4, T3FREE,  THYROIDAB in the last 72 hours. Lipid Profile: No results for input(s): CHOL, HDL, LDLCALC, TRIG, CHOLHDL, LDLDIRECT in the last 72 hours. Anemia Panel: No results for input(s): VITAMINB12, FOLATE, FERRITIN, TIBC, IRON, RETICCTPCT in the last 72 hours. Urine analysis:    Component Value Date/Time   COLORURINE STRAW (A) 03/22/2024 1234   APPEARANCEUR CLEAR 03/22/2024 1234   LABSPEC 1.006 03/22/2024 1234   PHURINE 5.0 03/22/2024 1234   GLUCOSEU NEGATIVE 03/22/2024 1234   HGBUR SMALL (A) 03/22/2024 1234   BILIRUBINUR NEGATIVE 03/22/2024 1234   KETONESUR NEGATIVE 03/22/2024 1234   PROTEINUR NEGATIVE 03/22/2024 1234   UROBILINOGEN 1.0 04/01/2015 0948   NITRITE NEGATIVE 03/22/2024 1234   LEUKOCYTESUR NEGATIVE 03/22/2024 1234   Sepsis Labs: Invalid input(s): PROCALCITONIN, LACTICIDVEN  Microbiology: Recent Results (from the past 240 hours)  Culture, blood (routine x 2)     Status: None (Preliminary result)   Collection Time: 04/10/24  3:48 PM   Specimen: Right Antecubital; Blood  Result Value Ref Range Status   Specimen Description   Final    RIGHT ANTECUBITAL Performed at [facility], [address] [place], [place] [address]    Special Requests   Final    BOTTLES DRAWN AEROBIC AND ANAEROBIC Blood Culture adequate volume Performed at [facility], [address] [place], [place] [address]    Culture   Final    NO GROWTH 3 DAYS Performed at [facility], [address], [place], [place] [address]    Report Status PENDING  Incomplete  Culture, blood (routine x 2)     Status: None (Preliminary result)   Collection Time: 04/11/24  1:10 AM   Specimen: BLOOD LEFT ARM  Result Value Ref Range Status   Specimen Description   Final    BLOOD LEFT ARM Performed at [facility], [address], [place], [place] [address]    Special Requests   Final    BOTTLES DRAWN AEROBIC AND ANAEROBIC Blood Culture results may not  be optimal due to an inadequate volume of blood received in culture bottles Performed at [facility], [address], [place], [place] [address]    Culture   Final    NO GROWTH  2 DAYS Performed at [facility], [address] [place], [place] [address]    Report Status PENDING  Incomplete    Radiology Studies: No results found.   Time spent: 35 minutes.  Leatrice chapel, MD [facility]  If 7PM-7AM, please contact night-coverage [email or web address] 04/14/2024, 10:49 AM

## 2024-04-15 ENCOUNTER — Telehealth (HOSPITAL_COMMUNITY): Payer: Self-pay | Admitting: Pharmacy Technician

## 2024-04-15 ENCOUNTER — Other Ambulatory Visit (HOSPITAL_COMMUNITY): Payer: Self-pay

## 2024-04-15 DIAGNOSIS — Z7901 Long term (current) use of anticoagulants: Secondary | ICD-10-CM

## 2024-04-15 DIAGNOSIS — E119 Type 2 diabetes mellitus without complications: Secondary | ICD-10-CM | POA: Diagnosis not present

## 2024-04-15 DIAGNOSIS — I81 Portal vein thrombosis: Secondary | ICD-10-CM

## 2024-04-15 DIAGNOSIS — D696 Thrombocytopenia, unspecified: Secondary | ICD-10-CM

## 2024-04-15 DIAGNOSIS — I10 Essential (primary) hypertension: Secondary | ICD-10-CM

## 2024-04-15 DIAGNOSIS — K5792 Diverticulitis of intestine, part unspecified, without perforation or abscess without bleeding: Secondary | ICD-10-CM | POA: Diagnosis not present

## 2024-04-15 DIAGNOSIS — Z79899 Other long term (current) drug therapy: Secondary | ICD-10-CM

## 2024-04-15 LAB — GLUCOSE, CAPILLARY
Glucose-Capillary: 187 mg/dL — ABNORMAL HIGH (ref 70–99)
Glucose-Capillary: 177 mg/dL — ABNORMAL HIGH (ref 70–99)
Glucose-Capillary: 207 mg/dL — ABNORMAL HIGH (ref 70–99)
Glucose-Capillary: 230 mg/dL — ABNORMAL HIGH (ref 70–99)

## 2024-04-15 LAB — CULTURE, BLOOD (ROUTINE X 2)
Culture: NO GROWTH
Special Requests: ADEQUATE

## 2024-04-15 MED ORDER — METOPROLOL SUCCINATE ER 50 MG PO TB24: 75.0000 mg | ORAL_TABLET | Freq: Once | ORAL | Status: DC

## 2024-04-15 MED ORDER — APIXABAN 5 MG PO TABS
10.0000 mg | ORAL_TABLET | Freq: Two times a day (BID) | ORAL | Status: DC
  Administered 2024-04-15 – 2024-04-17 (×4): 10 mg via ORAL
  Filled 2024-04-15 (×4): qty 2

## 2024-04-15 MED ORDER — METOPROLOL SUCCINATE ER 50 MG PO TB24
100.0000 mg | ORAL_TABLET | Freq: Every day | ORAL | Status: DC
  Administered 2024-04-15 – 2024-04-17 (×3): 100 mg via ORAL
  Filled 2024-04-15 (×3): qty 2

## 2024-04-15 MED ORDER — LOSARTAN POTASSIUM 50 MG PO TABS
50.0000 mg | ORAL_TABLET | Freq: Once | ORAL | Status: AC
  Administered 2024-04-15: 50 mg via ORAL
  Filled 2024-04-15: qty 1

## 2024-04-15 MED ORDER — APIXABAN 5 MG PO TABS: 5.0000 mg | ORAL_TABLET | Freq: Two times a day (BID) | ORAL | Status: DC

## 2024-04-15 MED ORDER — APIXABAN 5 MG PO TABS
5.0000 mg | ORAL_TABLET | ORAL | Status: AC
  Administered 2024-04-15: 5 mg via ORAL
  Filled 2024-04-15: qty 1

## 2024-04-15 MED ORDER — APIXABAN 5 MG PO TABS
5.0000 mg | ORAL_TABLET | Freq: Two times a day (BID) | ORAL | Status: DC
  Administered 2024-04-15: 5 mg via ORAL
  Filled 2024-04-15: qty 1

## 2024-04-15 MED ORDER — LOSARTAN POTASSIUM 50 MG PO TABS
100.0000 mg | ORAL_TABLET | Freq: Every day | ORAL | Status: DC
  Administered 2024-04-16 – 2024-04-17 (×2): 100 mg via ORAL
  Filled 2024-04-15 (×2): qty 2

## 2024-04-15 NOTE — Progress Notes (Signed)
 PROGRESS NOTE  Lisa Ibarra FMW:[id number] DOB: Nov 17, 1960   PCP: Cesario Mutton, MD  Patient is from: Home.  DOA: 04/10/2024 LOS: 5  Chief complaints Chief Complaint  Patient presents with   Chest Pain     Brief Narrative / Interim history: Patient is a 63 year old female past medical history significant for COPD, HFpEF, DM-2, chronic pain, HTN, tobacco use disorder and recent hospitalization for sigmoid diverticulitis (from 11/7-11/11) for which patient was managed conservatively.  Patient presented with worsening abdominal pain, intermittent nausea and vomiting, and was admitted with acute complicated sigmoid diverticulitis with microperforation and possible abscess.  Patient is on IV antibiotics.  Patient continues to report lower abdominal pain.  CT abdomen and pelvis revealed portal vein thrombosis.  Patient has been seen by the hematology team.  Patient was started on Eliquis  today, 04/15/2024.  Surgical team is directing care.    CT scan of the abdomen and pelvis with contrast done today, 04/14/2024, revealed: 1. Acute, perforated diverticulitis involving the sigmoid colon with a poorly defined gas and debris collection in the central pelvis, grossly unchanged compared with the previous exam. No drainable abscess identified. 2. 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 3. Several small foci of gas identified within the portal venous system 4. Results were called to Dr. Clorinda Bury at the time of interpretation on 04/14/2024 at 12:38 pm.     Subjective: -Patient continues to report lower abdominal pain.    Assessment and plan: Acute complicated sigmoid diverticulitis with microperforation and possible abscess: -Recently admitted from 11/7-11/03/2024, and manage conservatively with bowel rest and antibiotics. -Patient represented with worsening  abdominal pain, with intermittent nausea and vomiting. - CT abdomen and pelvis as above.   -Blood cultures NGTD. -General surgery is directing care.   -Continue IV Zosyn  - Continue to optimize pain control.  Chronic HFpEF: - TTE on 10/30 with LVEF of 55 to 60%, G1DD.  No cardiopulmonary symptoms.  On Lasix  20 to 40 mg daily as needed at home.  Appears euvolemic on exam. - Stable.   - Continue Toprol -XL.   -Increased home losartan  for BP.   - Continue to monitor fluid status closely.  Chronic COPD: - Stable. - Continue home bronchodilators/nebulizers.  NIDDM-2 with hyperglycemia: - A1c 7.0% on 10/30.   - Continue SSI-moderate  Essential hypertension:  -Blood pressure is reasonably controlled.   - Continue losartan  and Toprol -XL.    Tobacco use disorder -Courage cessation. -Nicotine  patch  Hypokalemia/hypomagnesemia - Last potassium of 3.6 and magnesium  of 1.7 today.  Hyponatremia:  -Resolved. - Last sodium of 136 today.    There is no height or weight on file to calculate BMI.     DVT prophylaxis:   apixaban  (ELIQUIS ) tablet 10 mg  apixaban  (ELIQUIS ) tablet 5 mg  Code Status: Full code Family Communication: Level of care: Med-Surg Status is: Inpatient Remains inpatient appropriate because: Acute complicated diverticulitis with microperforation and possible abscess  Final disposition: Home  Consultants:  General Surgery  Procedures: None  Microbiology summarized: Blood cultures NGTD  Objective: Vitals:   04/15/24 0557 04/15/24 0734 04/15/24 1335 04/15/24 1458  BP: (!) 175/85  (!) 190/91 (!) 177/84  Pulse: 84  89 92  Resp:   18   Temp:   98.4 F (36.9 C)   TempSrc:   Oral   SpO2:  95% 97%     Examination:  GENERAL: Not in any distress.  Obese abdomen. HEENT: No pallor or jaundice.  NECK: Supple.   RESP: Clear to auscultation. CVS: S1-S2.  ABD/GI/GU: BS+. Abd is obese, soft and tender lower abdominal area.   NEURO: AA.  Oriented  appropriately.    Sch Meds:  Scheduled Meds:  apixaban   10 mg Oral BID   Followed by   Lisa Ibarra ON 04/22/2024] apixaban   5 mg Oral BID   arformoterol   15 mcg Nebulization BID   atorvastatin   20 mg Oral Daily   budesonide   0.5 mg Nebulization BID   insulin  aspart  0-15 Units Subcutaneous TID WC   insulin  aspart  0-5 Units Subcutaneous QHS   insulin  aspart  4 Units Subcutaneous TID WC   insulin  glargine-yfgn  15 Units Subcutaneous Daily   [START ON 04/16/2024] losartan   100 mg Oral Daily   metoprolol  succinate  100 mg Oral Daily   revefenacin   175 mcg Nebulization Daily   Continuous Infusions:  piperacillin -tazobactam (ZOSYN )  IV 3.375 g (04/15/24 1346)   PRN Meds:.acetaminophen , albuterol , hydrALAZINE , HYDROmorphone  (DILAUDID ) injection, lip balm, ondansetron  **OR** ondansetron  (ZOFRAN ) IV, oxyCODONE , simethicone   Antimicrobials: Anti-infectives (From admission, onward)    Start     Dose/Rate Route Frequency Ordered Stop   04/10/24 1945  piperacillin -tazobactam (ZOSYN ) IVPB 3.375 g        3.375 g 12.5 mL/hr over 240 Minutes Intravenous Every 8 hours 04/10/24 1933     04/10/24 1800  cefTRIAXone  (ROCEPHIN ) 1 g in sodium chloride  0.9 % 100 mL IVPB        1 g 200 mL/hr over 30 Minutes Intravenous  Once 04/10/24 1755 04/10/24 1903   04/10/24 1800  metroNIDAZOLE  (FLAGYL ) IVPB 500 mg        500 mg 100 mL/hr over 60 Minutes Intravenous  Once 04/10/24 1755 04/10/24 1957        I have personally reviewed the following labs and images: CBC: Recent Labs  Lab 04/10/24 1548 04/11/24 0110 04/12/24 0444 04/13/24 0516 04/14/24 0514  WBC 7.0 7.3 6.2 5.4 5.7  NEUTROABS  --  5.3  --   --   --   HGB 15.7* 15.0 13.9 14.5 14.5  HCT 47.1* 47.2* 43.0 44.1 43.8  MCV 95.2 98.7 98.4 96.3 96.7  PLT 157 144* 141* 145* 160   BMP &GFR Recent Labs  Lab 04/10/24 1548 04/11/24 0110 04/12/24 0444 04/13/24 0516 04/14/24 0514  NA 136 138 134* 136 136  K 3.6 3.5 3.4* 4.0 3.6  CL 100 103 99  102 102  CO2 25 26 26 24 25   GLUCOSE 279* 195* 152* 145* 162*  BUN 5* <5* <5* <5* <5*  CREATININE 0.64 0.72 0.73 0.74 0.71  CALCIUM  9.2 9.1 8.7* 9.1 9.1  MG  --   --  1.4* 1.8 1.7  PHOS  --   --  2.6 2.4* 2.5   CrCl cannot be calculated (Unknown ideal weight.). Liver & Pancreas: Recent Labs  Lab 04/10/24 1548 04/11/24 0110 04/12/24 0444 04/13/24 0516 04/14/24 0514  AST 11* 11*  --   --   --   ALT <5 5  --   --   --   ALKPHOS 87 81  --   --   --   BILITOT 0.7 0.7  --   --   --   PROT 7.5 7.3  --   --   --   ALBUMIN 3.3* 3.0* 3.0* 3.0* 3.0*   Recent Labs  Lab 04/10/24 1548  LIPASE 28   No results for input(s): AMMONIA in the last 168 hours. Diabetic: No results for input(s): HGBA1C in the last 72 hours. Recent Labs  Lab 04/14/24 2033 04/14/24 2145 04/15/24 0733 04/15/24 1141 04/15/24 1647  GLUCAP 199* 194* 187* 177* 207*   Cardiac Enzymes: No results for input(s): CKTOTAL, CKMB, CKMBINDEX, TROPONINI in the last 168 hours. Recent Labs    03/21/24 1310 03/25/24 1938  PROBNP 2,893.0* 646.0*   Coagulation Profile: No results for input(s): INR, PROTIME in the last 168 hours. Thyroid Function Tests: No results for input(s): TSH, T4TOTAL, FREET4, T3FREE, THYROIDAB in the last 72 hours. Lipid Profile: No results for input(s): CHOL, HDL, LDLCALC, TRIG, CHOLHDL, LDLDIRECT in the last 72 hours. Anemia Panel: No results for input(s): VITAMINB12, FOLATE, FERRITIN, TIBC, IRON, RETICCTPCT in the last 72 hours. Urine analysis:    Component Value Date/Time   COLORURINE STRAW (A) 03/22/2024 1234   APPEARANCEUR CLEAR 03/22/2024 1234   LABSPEC 1.006 03/22/2024 1234   PHURINE 5.0 03/22/2024 1234   GLUCOSEU NEGATIVE 03/22/2024 1234   HGBUR SMALL (A) 03/22/2024 1234   BILIRUBINUR NEGATIVE 03/22/2024 1234   KETONESUR NEGATIVE 03/22/2024 1234   PROTEINUR NEGATIVE 03/22/2024 1234   UROBILINOGEN 1.0 04/01/2015 0948   NITRITE  NEGATIVE 03/22/2024 1234   LEUKOCYTESUR NEGATIVE 03/22/2024 1234   Sepsis Labs: Invalid input(s): PROCALCITONIN, LACTICIDVEN  Microbiology: Recent Results (from the past 240 hours)  Culture, blood (routine x 2)     Status: None   Collection Time: 04/10/24  3:48 PM   Specimen: Right Antecubital; Blood  Result Value Ref Range Status   Specimen Description   Final    RIGHT ANTECUBITAL Performed at [facility], [address] [place], [place] [address]    Special Requests   Final    BOTTLES DRAWN AEROBIC AND ANAEROBIC Blood Culture adequate volume Performed at [facility], [address] [place], [place] [address]    Culture   Final    NO GROWTH 5 DAYS Performed at [facility], [address], [place], [place] [address]    Report Status 04/15/2024 FINAL  Final  Culture, blood (routine x 2)     Status: None (Preliminary result)   Collection Time: 04/11/24  1:10 AM   Specimen: BLOOD LEFT ARM  Result Value Ref Range Status   Specimen Description   Final    BLOOD LEFT ARM Performed at [facility], [address] [place], [place] [address]    Special Requests   Final    BOTTLES DRAWN AEROBIC AND ANAEROBIC Blood Culture results may not be optimal due to an inadequate volume of blood received in culture bottles Performed at [facility], [address], [place], [place] [address]    Culture   Final    NO GROWTH 4 DAYS Performed at [facility], [address] [place], [place] [address]    Report Status PENDING  Incomplete    Radiology Studies: No results found.   Time spent: 35 minutes.  Leatrice chapel, MD [facility]  If 7PM-7AM, please contact night-coverage [email or web address] 04/15/2024, 5:23 PM

## 2024-04-15 NOTE — Telephone Encounter (Signed)
 Patient Product/process Development Scientist completed.    The patient is insured through [facility]. Patient has [employer], may use a copay card, and/or apply for patient assistance if available.    Ran test claim for Eliquis  5 mg and the current 30 day co-pay is $0.00.   This test claim was processed through [facility]- copay amounts may vary at other pharmacies due to pharmacy/plan contracts, or as the patient moves through the different stages of their insurance plan.     Reyes Sharps, CPHT Pharmacy Technician Patient Advocate Specialist Lead [facility] Patient Advocate Team Direct Number: [phone]  Fax: [phone]

## 2024-04-15 NOTE — Consult Note (Cosign Needed)
 [facility]  [phone] [phone]    HEMATOLOGY CONSULTATION  PURPOSE OF CONSULTATION/CHIEF COMPLAINT: Portal vein thrombus  Referring MD:  Dr. Rosario   HPI: Lisa Ibarra is a 63 year old female patient who was admitted on 04/11/2024 with complaints of increased abdominal pain, nausea and vomiting.  Of note patient has had several admissions including 03/21/2024 and 03/25/2024 for abdominal pain.  Imaging done 11/23 concerning for portal vein thrombosis with clot in left lobe of liver.  Therefore hematology evaluation has been requested. Patient is seen awake alert and oriented sitting in chair at bedside putting on her make-up.  Patient immediately expressed frustration with hospital, states that she thinks that we think she is drug seeking and only wants pain medication.  Patient expressed frustration with multiple issues including garbage not being emptied (although it is noted that garbage can has very little material inside).  States she is eager to go home although multiple times throughout assessment she would stop talking to clutch her abdomen and states that she is in a lot of pain that is not being addressed.  Currently denies nausea and vomiting.  Medical history includes diverticulitis, diabetes, hypertension, COPD with asthma exacerbation, heart failure, and chronic pain syndrome. Family hematologic history is denied. (Noted in documentation father had lung cancer and mother stroke). Social history significant for tobacco use, states that she quit in October 2025, admits to smoking 5 cigarettes/day.  Denies alcohol use.  Denies recreational or illicit drug use (although note in documentation cannabis use).  Admits exposure to hair chemicals and dyes while working.     ASSESSMENT AND PLAN:  Portal vein thrombosis - Imaging done 04/14/2024 shows signs of portal vein thrombosis with clot in left lobe of liver.  - Recommend anticoagulation  with Eliquis .  After much discussion, patient is agreeable to taking anticoagulation. For loading dose Eliquis  10 mg twice daily x 7 days, then 5 mg twice daily.  - Hematology/Dr. Timmy will see patient and make further recommendations.  Diverticulitis with perforation Abdominal pain Chronic pain - Patient noted with acute perforated diverticulitis of sigmoid colon per multiple imaging done. - Continue pain medication management. Requests that nursing staff please give pain meds as ordered.  - Medicine and Surgery following closely   Thrombocytopenia -- Improved -- Platelets 160K today.  Previously low 52K on 11/4 -- Monitor CBC with differential  Diabetes Hypertension -- Blood glucose elevated, monitor levels closely -- Blood pressure elevated, continue antihypertensives as ordered. Continue to monitor BP closely.     Past Medical History:  Diagnosis Date   Asthma    Cancer (HCC)    Cannabis abuse    Chronic pain    Diabetes mellitus type 2, controlled (HCC)    Hypertension   :  Past Surgical History:  Procedure Laterality Date   CESAREAN SECTION     JOINT REPLACEMENT     Left eye     TUBAL LIGATION     TUMOR REMOVAL from back    :  Allergies  Allergen Reactions   Bee Venom Anaphylaxis and Hives   Empagliflozin Itching, Rash and Dermatitis   Ibuprofen Nausea And Vomiting   Statins Nausea Only and Other (See Comments)    Muscle spasms and cramps, too  :   Family History  Problem Relation Age of Onset   Stroke Mother    Lung cancer Father    Diabetes Mellitus II Sister   :   Social History   Socioeconomic  History   Marital status: Divorced    Spouse name: Not on file   Number of children: Not on file   Years of education: Not on file   Highest education level: Not on file  Occupational History   Not on file  Tobacco Use   Smoking status: Every Day    Current packs/day: 0.25    Types: Cigarettes   Smokeless tobacco: Never  Substance and  Sexual Activity   Alcohol use: No    Comment: last modearate drinking was 2 years ago   Drug use: Yes    Types: Marijuana    Comment: every now and then for medicinal purposes   Sexual activity: Not on file  Other Topics Concern   Not on file  Social History Narrative   Not on file   Social Drivers of Health   Financial Resource Strain: Low Risk  (10/27/2023)   Received from [facility]   Overall Financial Resource Strain (CARDIA)    Difficulty of Paying Living Expenses: Not very hard  Food Insecurity: No Food Insecurity (04/11/2024)   Hunger Vital Sign    Worried About Running Out of Food in the Last Year: Never true    Ran Out of Food in the Last Year: Never true  Transportation Needs: No Transportation Needs (04/11/2024)   PRAPARE - Administrator, Civil Service (Medical): No    Lack of Transportation (Non-Medical): No  Physical Activity: Unknown (10/27/2023)   Received from [facility]   Exercise Vital Sign    On average, how many days per week do you engage in moderate to strenuous exercise (like a brisk walk)?: 0 days    Minutes of Exercise per Session: Not on file  Stress: Stress Concern Present (10/27/2023)   Received from [facility] - Occupational Stress Questionnaire    Feeling of Stress : To some extent  Social Connections: Moderately Integrated (10/27/2023)   Received from [facility]   Social Network    How would you rate your social network (family, work, friends)?: Adequate participation with social networks  Intimate Partner Violence: Not At Risk (04/11/2024)   Humiliation, Afraid, Rape, and Kick questionnaire    Fear of Current or Ex-Partner: No    Emotionally Abused: No    Physically Abused: No    Sexually Abused: No  :   CURRENT MEDS: Current Facility-Administered Medications  Medication Dose Route Frequency Provider Last Rate Last Admin   acetaminophen  (TYLENOL ) tablet 650 mg  650 mg Oral  Q6H PRN Gonfa, Taye T, MD   650 mg at 04/14/24 0854   albuterol  (PROVENTIL ) (2.5 MG/3ML) 0.083% nebulizer solution 2.5 mg  2.5 mg Nebulization Q2H PRN Debby Camila LABOR, MD       arformoterol  (BROVANA ) nebulizer solution 15 mcg  15 mcg Nebulization BID Gonfa, Taye T, MD   15 mcg at 04/15/24 0736   atorvastatin  (LIPITOR) tablet 20 mg  20 mg Oral Daily Gonfa, Taye T, MD   20 mg at 04/15/24 0954   budesonide  (PULMICORT ) nebulizer solution 0.5 mg  0.5 mg Nebulization BID Gonfa, Taye T, MD   0.5 mg at 04/15/24 0737   heparin  injection 5,000 Units  5,000 Units Subcutaneous Q8H Debby Camila A, MD   5,000 Units at 04/15/24 0549   hydrALAZINE  (APRESOLINE ) tablet 25 mg  25 mg Oral Q6H PRN Gonfa, Taye T, MD   25 mg at 04/15/24 0145   HYDROmorphone  (DILAUDID ) injection 0.5-1 mg  0.5-1 mg Intravenous Q2H PRN Thomas, Sara-Maiz A, MD   1 mg at 04/15/24 0023   insulin  aspart (novoLOG ) injection 0-15 Units  0-15 Units Subcutaneous TID WC Gonfa, Taye T, MD   3 Units at 04/15/24 9247   insulin  aspart (novoLOG ) injection 0-5 Units  0-5 Units Subcutaneous QHS Gonfa, Taye T, MD   2 Units at 04/13/24 2311   insulin  aspart (novoLOG ) injection 4 Units  4 Units Subcutaneous TID WC Gonfa, Taye T, MD   4 Units at 04/15/24 0753   insulin  glargine-yfgn (SEMGLEE ) injection 15 Units  15 Units Subcutaneous Daily Gonfa, Taye T, MD   15 Units at 04/15/24 0958   lip balm (CARMEX) ointment   Topical PRN Gonfa, Taye T, MD   Given at 04/11/24 1405   losartan  (COZAAR ) tablet 50 mg  50 mg Oral Daily Gonfa, Taye T, MD   50 mg at 04/15/24 0954   metoprolol  succinate (TOPROL -XL) 24 hr tablet 12.5 mg  12.5 mg Oral QHS Gonfa, Taye T, MD   12.5 mg at 04/14/24 2229   ondansetron  (ZOFRAN ) tablet 4 mg  4 mg Oral Q6H PRN Debby Camila LABOR, MD       Or   ondansetron  (ZOFRAN ) injection 4 mg  4 mg Intravenous Q6H PRN Debby Camila LABOR, MD   4 mg at 04/11/24 0035   oxyCODONE  (Oxy IR/ROXICODONE ) immediate release tablet 5 mg  5 mg Oral Q6H  PRN Gonfa, Taye T, MD   5 mg at 04/15/24 0555   piperacillin -tazobactam (ZOSYN ) IVPB 3.375 g  3.375 g Intravenous Q8H Debby Camila A, MD 12.5 mL/hr at 04/15/24 0552 3.375 g at 04/15/24 9447   revefenacin  (YUPELRI ) nebulizer solution 175 mcg  175 mcg Nebulization Daily Gonfa, Taye T, MD   175 mcg at 04/15/24 0734   simethicone  (MYLICON) chewable tablet 80 mg  80 mg Oral QID PRN Gonfa, Taye T, MD   80 mg at 04/14/24 1809    PHYSICAL EXAMINATION: ECOG PERFORMANCE STATUS: 1 - Symptomatic but completely ambulatory  Vitals:   04/15/24 0557 04/15/24 0734  BP: (!) 175/85   Pulse: 84   Resp:    Temp:    SpO2:  95%   There were no vitals filed for this visit.  GENERAL: alert, no distress and comfortable SKIN: +ecchymoses UE/ac and abdomen secondary to injections and blood draws EYES: normal, conjunctiva are pink and non-injected, sclera clear OROPHARYNX: no exudate, no erythema and lips, buccal mucosa, and tongue normal  NECK: supple, thyroid normal size, non-tender, without nodularity LYMPH: no palpable lymphadenopathy in the cervical, axillary or inguinal LUNGS: clear to auscultation and percussion with normal breathing effort HEART: regular rate & rhythm and no murmurs and no lower extremity edema ABDOMEN: abdomen soft, non-tender and normal bowel sounds MUSCULOSKELETAL: no cyanosis of digits and no clubbing  PSYCH: alert & oriented x 3 with fluent speech NEURO: no focal motor/sensory deficits   LABS: Lab Results  Component Value Date   WBC 5.7 04/14/2024   HGB 14.5 04/14/2024   HCT 43.8 04/14/2024   MCV 96.7 04/14/2024   PLT 160 04/14/2024    Lab Results  Component Value Date   WBC 5.7 04/14/2024   HGB 14.5 04/14/2024   HCT 43.8 04/14/2024   PLT 160 04/14/2024   GLUCOSE 162 (H) 04/14/2024   CHOL 148 04/27/2009   TRIG 124 04/27/2009   HDL 46 04/27/2009   LDLCALC 77 04/27/2009   ALT 5 04/11/2024   AST 11 (L) 04/11/2024  NA 136 04/14/2024   K 3.6 04/14/2024   CL  102 04/14/2024   CREATININE 0.71 04/14/2024   BUN <5 (L) 04/14/2024   CO2 25 04/14/2024   INR 1.2 03/25/2024   HGBA1C 7.0 (H) 03/21/2024   MICROALBUR 15.93 (H) 04/27/2009    CT ABDOMEN PELVIS W CONTRAST Result Date: 04/14/2024 EXAM: CT ABDOMEN AND PELVIS WITH CONTRAST 04/14/2024 12:16:51 PM TECHNIQUE: CT of the abdomen and pelvis was performed with the administration of 100 mL iohexol  (OMNIPAQUE ) 300 MG/ML solution. Multiplanar reformatted images are provided for review. Automated exposure control, iterative reconstruction, and/or weight-based adjustment of the mA/kV was utilized to reduce the radiation dose to as low as reasonably achievable. COMPARISON: Compared to a previous examination. CLINICAL HISTORY: Diverticulitis, complication suspected. FINDINGS: LOWER CHEST: Areas of subsegmental, platelike atelectasis are noted within both lung bases. LIVER: No suspicious liver abnormality. There is a low attenuation filling defect within the left hepatic lobe branch of the portal vein , extrahepatic portal vein, portal venous confluence, and smv concerning for thrombus. Several foci of gas appear trapped within the thrombus. GALLBLADDER AND BILE DUCTS: Gallbladder is unremarkable. No biliary ductal dilatation. SPLEEN: The spleen is within normal limits in size and appearance. PANCREAS: The pancreas is normal in size and contour without focal lesion or ductal dilatation. ADRENAL GLANDS: Normal size and morphology bilaterally. No nodule, thickening, or hemorrhage. No periadrenal stranding. KIDNEYS, URETERS AND BLADDER: Nonobstructing left renal calculi identified. The largest is in the inferior pole measuring 8 mm, image 50/2. Bilateral Bosniak class 1 and 2 kidney cysts are identified. The largest arises off the anterior cortex of the right kidney measuring 3 cm, image 41/2. Per consensus, no follow-up is needed for simple Bosniak type 1 and 2 renal cysts, unless the patient has a malignancy history or risk  factors. The left kidney is partially obscured by streak artifact from a left hip arthroplasty device. No hydronephrosis. No perinephric or periureteral stranding. Urinary bladder is unremarkable. GI AND BOWEL: Signs of acute, perforated diverticulitis involving the sigmoid colon is again noted as noted on the previous exam. There is a poorly defined gas and debris collection within the central pelvis adjacent to the sigmoid colon which tracts cranially up towards the left kidney terminating just medial to the inferior pole of the left kidney. This appears grossly unchanged compared with the previous exam. No well defined peripherally enhancing fluid collection identified at this time to suggest a drainable abscess. The extraluminal collection of gas and debris measures 6.7 x 4.8 cm, image 62/2. Sinus tract extending from this area towards the lower pole of the left kidney measures approximately 7 to 8 cm in length. The appendix is visualized and normal in caliber, without wall thickening, periappendiceal inflammation, or fluid. Stomach demonstrates no acute abnormality. There is no bowel obstruction. PERITONEUM AND RETROPERITONEUM: No ascites. No free air. VASCULATURE: Aorta is normal in caliber. LYMPH NODES: No lymphadenopathy. REPRODUCTIVE ORGANS: No acute abnormality. BONES AND SOFT TISSUES: Left hip arthroplasty. Lumbar degenerative disc disease and facet arthropathy. No acute osseous abnormality. No focal soft tissue abnormality. IMPRESSION: 1. Acute, perforated diverticulitis involving the sigmoid colon with a poorly defined gas and debris collection in the central pelvis, grossly unchanged compared with the previous exam. No drainable abscess identified. 2. 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 3. Several small foci of gas identified within the portal venous system  4. Results were  called to Dr. Clorinda Bury at the time of interpretation on 04/14/2024 at 12:38 pm. Electronically signed by: Waddell Calk MD 04/14/2024 12:39 PM EST RP Workstation: [id number]   CT ABDOMEN PELVIS W CONTRAST Result Date: 04/10/2024 EXAM: CT ABDOMEN AND PELVIS WITH CONTRAST 04/10/2024 05:13:38 PM TECHNIQUE: CT of the abdomen and pelvis was performed with the administration of 125 mL of iohexol  (OMNIPAQUE ) 300 MG/ML solution. Multiplanar reformatted images are provided for review. Automated exposure control, iterative reconstruction, and/or weight-based adjustment of the mA/kV was utilized to reduce the radiation dose to as low as reasonably achievable. COMPARISON: CT abdomen and pelvis 03/25/2024. CLINICAL HISTORY: Diverticulitis, complication suspected. FINDINGS: LOWER CHEST: No acute abnormality. LIVER: The liver is unremarkable. GALLBLADDER AND BILE DUCTS: Gallbladder is unremarkable. No biliary ductal dilatation. SPLEEN: No acute abnormality. PANCREAS: No acute abnormality. ADRENAL GLANDS: No acute abnormality. KIDNEYS, URETERS AND BLADDER: No stones in the kidneys or ureters. No hydronephrosis. No perinephric or periureteral stranding. Urinary bladder is unremarkable. GI AND BOWEL: Persistent mid distal sigmoid diverticulitis with mild bowel thickening and pericolonic fat stranding superiorly. Microperforation is noted and best visualized on coronal images (8:111). There is no bowel obstruction. PERITONEUM AND RETROPERITONEUM: Interval development of a fluid and gas collection within the mid abdomen superior to the mid sigmoid colon (8:11). No free air. VASCULATURE: Aorta is normal in caliber. LYMPH NODES: No lymphadenopathy. REPRODUCTIVE ORGANS: No acute abnormality. BONES AND SOFT TISSUES: No acute osseous abnormality. No focal soft tissue abnormality. Intervertebral disc space vacuum phenomenon. Grade 1 anterolisthesis of L4 on L5. Grade 1 anterolisthesis of L3 on L4. To a superior endplate concavity  likely old healed fracture. IMPRESSION: 1. Acute complicated diverticulitis of the mid-distal sigmoid colon with microperforation and associated 5.4 x 6.1 x 9.2cm gas and fluid collection formation. Recommend colonoscopy status post treatment and status post complete resolution of inflammatory changes to exclude an underlying lesion. Electronically signed by: Morgane Naveau MD 04/10/2024 05:45 PM EST RP Workstation: [id number]   DG Chest Portable 1 View Result Date: 04/10/2024 EXAM: 1 VIEW(S) XRAY OF THE CHEST 04/10/2024 03:30:00 PM COMPARISON: Comparison 03/22/2024. Stable cardiomediastinal silhouette. CLINICAL HISTORY: cp cp FINDINGS: LUNGS AND PLEURA: No focal pulmonary opacity. No pleural effusion. No pneumothorax. HEART AND MEDIASTINUM: Stable cardiomediastinal silhouette. BONES AND SOFT TISSUES: No acute osseous abnormality. IMPRESSION: 1. No acute cardiopulmonary disease. Electronically signed by: Lynwood Seip MD 04/10/2024 03:39 PM EST RP Workstation: [id number]   CT ABDOMEN PELVIS W CONTRAST Result Date: 03/25/2024 EXAM: CT ABDOMEN AND PELVIS WITH CONTRAST 03/25/2024 09:55:59 PM TECHNIQUE: CT of the abdomen and pelvis was performed with the administration of 100 mL of iohexol  (OMNIPAQUE ) 350 MG/ML injection. Multiplanar reformatted images are provided for review. Automated exposure control, iterative reconstruction, and/or weight-based adjustment of the mA/kV was utilized to reduce the radiation dose to as low as reasonably achievable. COMPARISON: 04/01/2015 CLINICAL HISTORY: Abdominal pain, acute, nonlocalized. FINDINGS: LOWER CHEST: Basilar atelectasis. LIVER: The liver is unremarkable. GALLBLADDER AND BILE DUCTS: Gallbladder is unremarkable. No biliary ductal dilatation. SPLEEN: No acute abnormality. PANCREAS: No acute abnormality. ADRENAL GLANDS: Stable small nodules in the adrenal glands bilaterally, most compatible with adenomas. KIDNEYS, URETERS AND BLADDER: 1 m. m nonobstructing stone in the  upper pole of the left kidney. 8 mm nonobstructing stone in the lower pole of the left kidney. No hydronephrosis. The urinary bladder is obscured by beam hardening artifact from the left hip replacement. Small bilateral renal cysts appear benign. Per consensus, no follow-up is needed for simple  Bosniak type 1 and 2 renal cysts, unless the patient has a malignancy history or risk factors. No perinephric or periureteral stranding. GI AND BOWEL: Stomach demonstrates no acute abnormality. Sigmoid diverticulosis. Inflammatory stranding around the mid sigmoid colon compatible with active diverticulitis. There is no bowel obstruction. PERITONEUM AND RETROPERITONEUM: No ascites. No free air. VASCULATURE: Aorta is normal in caliber. Aortic atherosclerosis. LYMPH NODES: No lymphadenopathy. REPRODUCTIVE ORGANS: No acute abnormality. BONES AND SOFT TISSUES: Prior left hip replacement. Degenerative changes in the lumbar spine. No acute bony abnormality. No focal soft tissue abnormality. IMPRESSION: 1. Sigmoid diverticulosis with active diverticulitis in the mid sigmoid colon. 2. Nonobstructing left nephrolithiasis. Electronically signed by: Franky Crease MD 03/25/2024 10:24 PM EST RP Workstation: [id number]   CT Angio Chest PE W and/or Wo Contrast Result Date: 03/25/2024 EXAM: CTA of the Chest with contrast for PE 03/25/2024 09:55:59 PM TECHNIQUE: CTA of the chest was performed after the administration of 100 mL of iohexol  (OMNIPAQUE ) 350 MG/ML injection. Multiplanar reformatted images are provided for review. MIP images are provided for review. Automated exposure control, iterative reconstruction, and/or weight based adjustment of the mA/kV was utilized to reduce the radiation dose to as low as reasonably achievable. COMPARISON: 08/18/2021 CLINICAL HISTORY: Pulmonary embolism (PE) suspected, high prob. FINDINGS: PULMONARY ARTERIES: Pulmonary arteries are adequately opacified for evaluation. No pulmonary embolism. Main  pulmonary artery is normal in caliber. MEDIASTINUM: The heart and pericardium demonstrate no acute abnormality. Scattered coronary artery and aortic atherosclerosis. There is no acute abnormality of the thoracic aorta. LYMPH NODES: No mediastinal, hilar or axillary lymphadenopathy. LUNGS AND PLEURA: Areas of atelectasis in the mid and lower lungs. No focal consolidation or pulmonary edema. No pleural effusion or pneumothorax. UPPER ABDOMEN: Limited images of the upper abdomen are unremarkable. SOFT TISSUES AND BONES: No acute bone or soft tissue abnormality. IMPRESSION: 1. No pulmonary embolism. 2. Areas of atelectasis in the mid and lower lungs. No effusions. Electronically signed by: Franky Crease MD 03/25/2024 10:19 PM EST RP Workstation: [id number]   ECHOCARDIOGRAM COMPLETE Result Date: 03/22/2024    ECHOCARDIOGRAM REPORT   Patient Name:   Lisa Ibarra [facility] Date of Exam: 03/22/2024 Medical Rec #:  [id number]     Height:       69.0 in Accession #:    [id number]    Weight:       286.8 lb Date of Birth:  1961/01/28     BSA:          2.408 m Patient Age:    63 years      BP:           144/76 mmHg Patient Gender: F             HR:           84 bpm. Exam Location:  Inpatient Procedure: 2D Echo, Color Doppler and Cardiac Doppler (Both Spectral and Color            Flow Doppler were utilized during procedure). Indications:    CHF I50.31  History:        Patient has no prior history of Echocardiogram examinations.                 Risk Factors:Diabetes and Hypertension.  Sonographer:    Tinnie Gosling RDCS Referring Phys: [phone] WHITNEY D HARRIS IMPRESSIONS  1. This was a very limited study due to poor acoustic windows and lack of echo contrast. In limited views, the LV systolic function appears preserved without focal wall  motion abnormalities.  2. Left ventricular ejection fraction, by estimation, is 55 to 60%. The left ventricle has normal function. The left ventricle has no regional wall motion abnormalities. Not well  visualized left ventricular hypertrophy. Left ventricular diastolic parameters are consistent with Grade I diastolic dysfunction (impaired relaxation).  3. Right ventricular systolic function is normal. The right ventricular size is not well visualized. Tricuspid regurgitation signal is inadequate for assessing PA pressure.  4. The mitral valve is normal in structure. No evidence of mitral valve regurgitation. No evidence of mitral stenosis.  5. The aortic valve was not well visualized. Aortic valve regurgitation is not visualized. No aortic stenosis is present.  6. The inferior vena cava is dilated in size with <50% respiratory variability, suggesting right atrial pressure of 15 mmHg. Comparison(s): No prior Echocardiogram. Conclusion(s)/Recommendation(s): Very limited study due to poor acoustic windows, but the LV systolic function appears normal. The RV was poorly visualized. Recommend repeat echocardiography with the use of echo contrast if better assessment of the RV function is necessary. FINDINGS  Left Ventricle: Left ventricular ejection fraction, by estimation, is 55 to 60%. The left ventricle has normal function. The left ventricle has no regional wall motion abnormalities. The left ventricular internal cavity size was normal in size. Not well  visualized left ventricular hypertrophy. Left ventricular diastolic parameters are consistent with Grade I diastolic dysfunction (impaired relaxation). Right Ventricle: The right ventricular size is not well visualized. Right vetricular wall thickness was not well visualized. Right ventricular systolic function is normal. Tricuspid regurgitation signal is inadequate for assessing PA pressure. Left Atrium: Left atrial size was normal in size. Right Atrium: Right atrial size was normal in size. Pericardium: There is no evidence of pericardial effusion. Mitral Valve: The mitral valve is normal in structure. No evidence of mitral valve regurgitation. No evidence of  mitral valve stenosis. Tricuspid Valve: The tricuspid valve is not well visualized. Tricuspid valve regurgitation is not demonstrated. No evidence of tricuspid stenosis. Aortic Valve: The aortic valve was not well visualized. Aortic valve regurgitation is not visualized. No aortic stenosis is present. Pulmonic Valve: The pulmonic valve was not well visualized. Pulmonic valve regurgitation is not visualized. No evidence of pulmonic stenosis. Aorta: The aortic root and ascending aorta are structurally normal, with no evidence of dilitation. Venous: The inferior vena cava is dilated in size with less than 50% respiratory variability, suggesting right atrial pressure of 15 mmHg. IAS/Shunts: The interatrial septum was not well visualized.  LEFT VENTRICLE PLAX 2D LVIDd:         5.00 cm   Diastology LVIDs:         3.80 cm   LV e' medial:    5.44 cm/s LV PW:         1.40 cm   LV E/e' medial:  17.4 LV IVS:        1.40 cm   LV e' lateral:   7.51 cm/s LVOT diam:     2.10 cm   LV E/e' lateral: 12.6 LV SV:         93 LV SV Index:   39 LVOT Area:     3.46 cm LV IVRT:       108 msec  RIGHT VENTRICLE             IVC RV S prime:     11.40 cm/s  IVC diam: 2.70 cm TAPSE (M-mode): 2.5 cm  PULMONARY VEINS                             Diastolic Velocity: 27.50 cm/s                             S/D Velocity:       1.00                             Systolic Velocity:  27.90 cm/s LEFT ATRIUM             Index        RIGHT ATRIUM           Index LA diam:        3.90 cm 1.62 cm/m   RA Area:     17.00 cm LA Vol (A2C):   32.1 ml 13.33 ml/m  RA Volume:   46.10 ml  19.14 ml/m LA Vol (A4C):   44.1 ml 18.31 ml/m LA Biplane Vol: 38.0 ml 15.78 ml/m  AORTIC VALVE LVOT Vmax:   125.00 cm/s LVOT Vmean:  99.800 cm/s LVOT VTI:    0.269 m  AORTA Ao Root diam: 3.30 cm Ao Asc diam:  3.30 cm MITRAL VALVE MV Area (PHT): 2.54 cm     SHUNTS MV Decel Time: 299 msec     Systemic VTI:  0.27 m MV E velocity: 94.70 cm/s   Systemic Diam:  2.10 cm MV A velocity: 114.00 cm/s MV E/A ratio:  0.83 Georganna Archer Electronically signed by Georganna Archer Signature Date/Time: 03/22/2024/12:54:44 PM    Final    DG Chest Port 1 View Result Date: 03/21/2024 CLINICAL DATA:  Possible sepsis.  Fever, cough and body aches. EXAM: PORTABLE CHEST 1 VIEW COMPARISON:  07/25/2022 FINDINGS: Lungs are somewhat hypoinflated without focal lobar consolidation or effusion. Subtle hazy prominence of the central pulmonary vessels likely due to mild vascular congestion. Cardiomediastinal silhouette and remainder of the exam is unchanged. IMPRESSION: Suggestion of mild vascular congestion. Electronically Signed   By: Toribio Agreste M.D.   On: 03/21/2024 13:48     The total time spent in the appointment was 55 minutes encounter with patients including review of chart and various tests results, discussions about plan of care and coordination of care plan   All questions were answered. The patient knows to call the clinic with any problems, questions or concerns. No barriers to learning was detected.  Thank you for the courtesy of this consultation, Olam JINNY Brunner, NP  11/24/202510:14 AM    ADDENDUM: I saw and examined Lisa Ibarra.  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  I am unsure how long she has had this.  She has diverticulitis.  This is clearly her bigger issue.  She is perforated.  I find it very interesting that just a month ago she had Pseudomonas and Klebsiella in her blood.  I just wonder if somehow this may have bit from a infected diverticulum that does not yet cause problems.  We will get her on oral anticoagulation.  It does not sound like surgery is going do anything with her.   Jeralyn Crease, MD  [place] 1:5

## 2024-04-15 NOTE — Progress Notes (Signed)
 Progress Note     Subjective: Patient reports continued pain of lower abdomen that comes and goes. She reports that pain has improved and is manageable with medications. Patient having bowel function. Denies nausea and vomiting. Tolerating soft diet.   ROS  All negative with the exception of above.  Objective: Vital signs in last 24 hours: Temp:  [97.8 F (36.6 C)-98.6 F (37 C)] 98.1 F (36.7 C) (11/24 0555) Pulse Rate:  [67-102] 84 (11/24 0557) Resp:  [16-20] 20 (11/24 0555) BP: (143-179)/(85-110) 175/85 (11/24 0557) SpO2:  [92 %-100 %] 95 % (11/24 0734) FiO2 (%):  [21 %] 21 % (11/24 0734) Last BM Date : 04/14/24  Intake/Output from previous day: 11/23 0701 - 11/24 0700 In: 1543.7 [P.O.:1440; IV Piggyback:103.7] Out: -  Intake/Output this shift: Total I/O In: 240 [P.O.:240] Out: 0   PE: General: Female who is sitting in bedside chair in NAD. HEENT: Head is normocephalic, atraumatic.  Heart: Normal HR during encounter.  Lungs: Respiratory effort nonlabored. Abd: Soft, NT, ND. Bruising noted of left lower abdomen. No rebound tenderness or guarding.  MS: all 4 extremities are symmetrical with no cyanosis, clubbing, or edema. Skin: Warm and dry. Psych: A&Ox3 with an appropriate affect.    Lab Results:  Recent Labs    04/13/24 0516 04/14/24 0514  WBC 5.4 5.7  HGB 14.5 14.5  HCT 44.1 43.8  PLT 145* 160   BMET Recent Labs    04/13/24 0516 04/14/24 0514  NA 136 136  K 4.0 3.6  CL 102 102  CO2 24 25  GLUCOSE 145* 162*  BUN <5* <5*  CREATININE 0.74 0.71  CALCIUM  9.1 9.1   PT/INR No results for input(s): LABPROT, INR in the last 72 hours. CMP     Component Value Date/Time   NA 136 04/14/2024 0514   K 3.6 04/14/2024 0514   CL 102 04/14/2024 0514   CO2 25 04/14/2024 0514   GLUCOSE 162 (H) 04/14/2024 0514   BUN <5 (L) 04/14/2024 0514   CREATININE 0.71 04/14/2024 0514   CALCIUM  9.1 04/14/2024 0514   PROT 7.3 04/11/2024 0110   ALBUMIN 3.0 (L)  04/14/2024 0514   AST 11 (L) 04/11/2024 0110   ALT 5 04/11/2024 0110   ALKPHOS 81 04/11/2024 0110   BILITOT 0.7 04/11/2024 0110   GFRNONAA >60 04/14/2024 0514   GFRAA >60 04/30/2019 0918   Lipase     Component Value Date/Time   LIPASE 28 04/10/2024 1548       Studies/Results: CT ABDOMEN PELVIS W CONTRAST Result Date: 04/14/2024 EXAM: CT ABDOMEN AND PELVIS WITH CONTRAST 04/14/2024 12:16:51 PM TECHNIQUE: CT of the abdomen and pelvis was performed with the administration of 100 mL iohexol  (OMNIPAQUE ) 300 MG/ML solution. Multiplanar reformatted images are provided for review. Automated exposure control, iterative reconstruction, and/or weight-based adjustment of the mA/kV was utilized to reduce the radiation dose to as low as reasonably achievable. COMPARISON: Compared to a previous examination. CLINICAL HISTORY: Diverticulitis, complication suspected. FINDINGS: LOWER CHEST: Areas of subsegmental, platelike atelectasis are noted within both lung bases. LIVER: No suspicious liver abnormality. There is a low attenuation filling defect within the left hepatic lobe branch of the portal vein , extrahepatic portal vein, portal venous confluence, and smv concerning for thrombus. Several foci of gas appear trapped within the thrombus. GALLBLADDER AND BILE DUCTS: Gallbladder is unremarkable. No biliary ductal dilatation. SPLEEN: The spleen is within normal limits in size and appearance. PANCREAS: The pancreas is normal in size and contour  without focal lesion or ductal dilatation. ADRENAL GLANDS: Normal size and morphology bilaterally. No nodule, thickening, or hemorrhage. No periadrenal stranding. KIDNEYS, URETERS AND BLADDER: Nonobstructing left renal calculi identified. The largest is in the inferior pole measuring 8 mm, image 50/2. Bilateral Bosniak class 1 and 2 kidney cysts are identified. The largest arises off the anterior cortex of the right kidney measuring 3 cm, image 41/2. Per consensus, no  follow-up is needed for simple Bosniak type 1 and 2 renal cysts, unless the patient has a malignancy history or risk factors. The left kidney is partially obscured by streak artifact from a left hip arthroplasty device. No hydronephrosis. No perinephric or periureteral stranding. Urinary bladder is unremarkable. GI AND BOWEL: Signs of acute, perforated diverticulitis involving the sigmoid colon is again noted as noted on the previous exam. There is a poorly defined gas and debris collection within the central pelvis adjacent to the sigmoid colon which tracts cranially up towards the left kidney terminating just medial to the inferior pole of the left kidney. This appears grossly unchanged compared with the previous exam. No well defined peripherally enhancing fluid collection identified at this time to suggest a drainable abscess. The extraluminal collection of gas and debris measures 6.7 x 4.8 cm, image 62/2. Sinus tract extending from this area towards the lower pole of the left kidney measures approximately 7 to 8 cm in length. The appendix is visualized and normal in caliber, without wall thickening, periappendiceal inflammation, or fluid. Stomach demonstrates no acute abnormality. There is no bowel obstruction. PERITONEUM AND RETROPERITONEUM: No ascites. No free air. VASCULATURE: Aorta is normal in caliber. LYMPH NODES: No lymphadenopathy. REPRODUCTIVE ORGANS: No acute abnormality. BONES AND SOFT TISSUES: Left hip arthroplasty. Lumbar degenerative disc disease and facet arthropathy. No acute osseous abnormality. No focal soft tissue abnormality. IMPRESSION: 1. Acute, perforated diverticulitis involving the sigmoid colon with a poorly defined gas and debris collection in the central pelvis, grossly unchanged compared with the previous exam. No drainable abscess identified. 2. 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 3. Several small foci of gas identified within the portal venous system 4. Results were called to Dr. Clorinda Bury at the time of interpretation on 04/14/2024 at 12:38 pm. Electronically signed by: Waddell Calk MD 04/14/2024 12:39 PM EST RP Workstation: [id number]    Anti-infectives: Anti-infectives (From admission, onward)    Start     Dose/Rate Route Frequency Ordered Stop   04/10/24 1945  piperacillin -tazobactam (ZOSYN ) IVPB 3.375 g        3.375 g 12.5 mL/hr over 240 Minutes Intravenous Every 8 hours 04/10/24 1933     04/10/24 1800  cefTRIAXone  (ROCEPHIN ) 1 g in sodium chloride  0.9 % 100 mL IVPB        1 g 200 mL/hr over 30 Minutes Intravenous  Once 04/10/24 1755 04/10/24 1903   04/10/24 1800  metroNIDAZOLE  (FLAGYL ) IVPB 500 mg        500 mg 100 mL/hr over 60 Minutes Intravenous  Once 04/10/24 1755 04/10/24 1957        Assessment/Plan Diverticulitis with microperforation with abscess -CT from 11/23 showed no significant changes in diverticular disease. However, portal clot noted that seems to have been present on prior imaging. Communication with [facility] was made. [facility] planning to discuss options with hematology.  -No tenderness noted on exam. -Encouraged PO pain medications. -WBC normal on 11/23. -Continue antibiotics.  -  Will continue to follow.   FEN - Soft VTE - hHparin ID - Zosyn     LOS: 5 days   I reviewed provider notes, specialist notes, nursing notes, hospitalist notes, last 24 h vitals and pain scores, last 48 h intake and output, last 24 h labs and trends, and last 24 h imaging results.  This care required moderate level of medical decision making.    Marjorie Carlyon Favre, [facility] 04/15/2024, 10:06 AM Please see Amion for pager number during day hours 7:00am-4:30pm

## 2024-04-16 DIAGNOSIS — K5792 Diverticulitis of intestine, part unspecified, without perforation or abscess without bleeding: Secondary | ICD-10-CM | POA: Diagnosis not present

## 2024-04-16 DIAGNOSIS — E119 Type 2 diabetes mellitus without complications: Secondary | ICD-10-CM | POA: Diagnosis not present

## 2024-04-16 DIAGNOSIS — D696 Thrombocytopenia, unspecified: Secondary | ICD-10-CM | POA: Diagnosis not present

## 2024-04-16 DIAGNOSIS — I81 Portal vein thrombosis: Secondary | ICD-10-CM | POA: Diagnosis not present

## 2024-04-16 LAB — CULTURE, BLOOD (ROUTINE X 2): Culture: NO GROWTH

## 2024-04-16 LAB — ANTITHROMBIN III: AntiThromb III Func: 72 % — ABNORMAL LOW (ref 75–120)

## 2024-04-16 LAB — GLUCOSE, CAPILLARY
Glucose-Capillary: 161 mg/dL — ABNORMAL HIGH (ref 70–99)
Glucose-Capillary: 186 mg/dL — ABNORMAL HIGH (ref 70–99)
Glucose-Capillary: 238 mg/dL — ABNORMAL HIGH (ref 70–99)
Glucose-Capillary: 238 mg/dL — ABNORMAL HIGH (ref 70–99)

## 2024-04-16 MED ORDER — HYDROMORPHONE HCL 1 MG/ML IJ SOLN
0.5000 mg | INTRAMUSCULAR | Status: DC | PRN
  Administered 2024-04-16 – 2024-04-17 (×3): 1 mg via INTRAVENOUS
  Filled 2024-04-16 (×3): qty 1

## 2024-04-16 MED ORDER — FUROSEMIDE 10 MG/ML IJ SOLN
40.0000 mg | Freq: Once | INTRAMUSCULAR | Status: AC
  Administered 2024-04-16: 40 mg via INTRAVENOUS
  Filled 2024-04-16: qty 4

## 2024-04-16 NOTE — Plan of Care (Signed)
   Problem: Education: Goal: Ability to describe self-care measures that may prevent or decrease complications (Diabetes Survival Skills Education) will improve Outcome: Progressing Goal: Individualized Educational Video(s) Outcome: Progressing

## 2024-04-16 NOTE — Discharge Instructions (Addendum)
 Information on my medicine - ELIQUIS  (apixaban )   Why was Eliquis  prescribed for you? Eliquis  was prescribed to treat blood clots that may have been found in the veins of your legs (deep vein thrombosis) or in your lungs (pulmonary embolism) and to reduce the risk of them occurring again.  What do You need to know about Eliquis  ? The starting dose is 10 mg (two 5 mg tablets) taken TWICE daily for the FIRST SEVEN (7) DAYS, then on 04/22/24 the dose is reduced to ONE 5 mg tablet taken TWICE daily.  Eliquis  may be taken with or without food.   Try to take the dose about the same time in the morning and in the evening. If you have difficulty swallowing the tablet whole please discuss with your pharmacist how to take the medication safely.  Take Eliquis  exactly as prescribed and DO NOT stop taking Eliquis  without talking to the doctor who prescribed the medication.  Stopping may increase your risk of developing a new blood clot.  Refill your prescription before you run out.  After discharge, you should have regular check-up appointments with your healthcare provider that is prescribing your Eliquis .    What do you do if you miss a dose? If a dose of ELIQUIS  is not taken at the scheduled time, take it as soon as possible on the same day and twice-daily administration should be resumed. The dose should not be doubled to make up for a missed dose.  Important Safety Information A possible side effect of Eliquis  is bleeding. You should call your healthcare provider right away if you experience any of the following: Bleeding from an injury or your nose that does not stop. Unusual colored urine (red or dark brown) or unusual colored stools (red or black). Unusual bruising for unknown reasons. A serious fall or if you hit your head (even if there is no bleeding).  Some medicines may interact with Eliquis  and might increase your risk of bleeding or clotting while on Eliquis . To help avoid this,  consult your healthcare provider or pharmacist prior to using any new prescription or non-prescription medications, including herbals, vitamins, non-steroidal anti-inflammatory drugs (NSAIDs) and supplements.  This website has more information on Eliquis  (apixaban ): [email or web address] .[email or web address] dena

## 2024-04-16 NOTE — Plan of Care (Signed)
  Problem: Health Behavior/Discharge Planning: Goal: Ability to manage health-related needs will improve Outcome: Progressing   Problem: Nutritional: Goal: Maintenance of adequate nutrition will improve Outcome: Adequate for Discharge

## 2024-04-16 NOTE — Progress Notes (Signed)
 PROGRESS NOTE  Lisa Ibarra  FMW:[id number] DOB: [birth year] DOA: 04/10/2024 PCP: Cesario Mutton, MD   Brief Narrative: Patient is a 63 year old female with history of COPD, HFpEF, diabetes type 2, chronic pain syndrome, hypertension, tobacco use disorder, recent hospitalization for sigmoid diverticulitis and was managed conservatively presented with worsening abdominal pain, nausea, vomiting.  Found to have acute complicated sigmoid diverticulitis with microperforation and possible abscess.  Started on IV antibiotics.General surgery consulted.  Follow-up CT abdomen/pelvis on 11/23 showed portal vein thrombosis, hematology consulted.  Started on Eliquis .  Plan to send hypercoagulable panel workup.  Clinically improving regarding perforated diverticulitis, tolerating soft diet.  Having bowel movement.  Plan to discharge after hematology, general surgery clearance.Likely tomorrow  Assessment & Plan:  Principal Problem:   Diverticulitis  Acute complicated sigmoid diverticulitis with microperforation/possible abscess: Was recently admitted and was managed conservatively for the same.  Presented back with worsening abdominal pain, nausea, vomiting.  CT abdomen/pelvis as above.  Blood cultures have been negative so far.  Currently on Zosyn . Clinically improved now.  Tolerating soft diet and having bowel movements.  Plan to change antibiotics to oral on discharge  Portal vein thrombosis: Incidental finding.  Hematology consulted to restart Eliquis .  Hematology planning for hypercoagulable workup, including  JAK2 analysis  Chronic HFpEF: TTE on 10/30 showed EF of 55 to 60%, grade 1 diastolic dysfunction.  On Lasix  as needed at home.   Continue Toprol ,losartan .  Has bilateral lower extremity pitting edema today.  Will give her dose of Lasix  40 mg once.  COPD: Currently stable without any exacerbation.  Diabetes type 2: Recent A1c of 7 as per 10/30.  Currently on sliding scale  Hypertension: Continue  losartan , Toprol .  Blood pressure on the higher side.  Continue to monitor.  Dose of losartan  and Toprol  have been maximized.  If continues to remain hypertensive, may need to add another medication as well  Tobacco use disorder: Continue nicotine  patch.  Counseled for cessation  Hypokalemia/hypomagnesemia: Continue to monitor and supplement as needed  Hyponatremia: Resolved  Morbid obesity: Weight of 130 kg         DVT prophylaxis: apixaban  (ELIQUIS ) tablet 10 mg  apixaban  (ELIQUIS ) tablet 5 mg     Code Status: Full Code  Family Communication: None at bedside  Patient status:Inpatient  Patient is from :home  Anticipated discharge un:ynfz  Estimated DC date:tomorrow   Consultants: General surgery, hematology  Procedures: None  Antimicrobials:  Anti-infectives (From admission, onward)    Start     Dose/Rate Route Frequency Ordered Stop   04/10/24 1945  piperacillin -tazobactam (ZOSYN ) IVPB 3.375 g        3.375 g 12.5 mL/hr over 240 Minutes Intravenous Every 8 hours 04/10/24 1933     04/10/24 1800  cefTRIAXone  (ROCEPHIN ) 1 g in sodium chloride  0.9 % 100 mL IVPB        1 g 200 mL/hr over 30 Minutes Intravenous  Once 04/10/24 1755 04/10/24 1903   04/10/24 1800  metroNIDAZOLE  (FLAGYL ) IVPB 500 mg        500 mg 100 mL/hr over 60 Minutes Intravenous  Once 04/10/24 1755 04/10/24 1957       Subjective: Patient seen and examined at bedside today.  Hemodynamically stable.  Sitting on the chair.  Looks comfortable.  No nausea or vomiting today.  Abdominal pain well-controlled.  She had a bowel movement this morning.  Has bilateral lower extremity pitting edema .she is eager to go home.  We discussed about possible discharge planning  in the morning  Objective: Vitals:   04/15/24 2111 04/16/24 0445 04/16/24 0928 04/16/24 0929  BP: (!) 152/89 (!) 170/91    Pulse: 82 78    Resp: 18 18    Temp: 98.4 F (36.9 C) 98.2 F (36.8 C)    TempSrc: Oral Oral    SpO2: 93% (!)  88% 93% 94%    Intake/Output Summary (Last 24 hours) at 04/16/2024 1029 Last data filed at 04/16/2024 1000 Gross per 24 hour  Intake 1705.18 ml  Output 0 ml  Net 1705.18 ml   There were no vitals filed for this visit.  Examination:  General exam: Overall comfortable, not in distress,obese HEENT: PERRL Respiratory system:  no wheezes or crackles  Cardiovascular system: S1 & S2 heard, RRR.  Gastrointestinal system: Abdomen is nondistended, soft.  Mild tenderness in the lower abdomen Central nervous system: Alert and oriented Extremities: Bilateral lower extremity pitting edema, no clubbing ,no cyanosis Skin: No rashes, no ulcers,no icterus     Data Reviewed: I have personally reviewed following labs and imaging studies  CBC: Recent Labs  Lab 04/10/24 1548 04/11/24 0110 04/12/24 0444 04/13/24 0516 04/14/24 0514  WBC 7.0 7.3 6.2 5.4 5.7  NEUTROABS  --  5.3  --   --   --   HGB 15.7* 15.0 13.9 14.5 14.5  HCT 47.1* 47.2* 43.0 44.1 43.8  MCV 95.2 98.7 98.4 96.3 96.7  PLT 157 144* 141* 145* 160   Basic Metabolic Panel: Recent Labs  Lab 04/10/24 1548 04/11/24 0110 04/12/24 0444 04/13/24 0516 04/14/24 0514  NA 136 138 134* 136 136  K 3.6 3.5 3.4* 4.0 3.6  CL 100 103 99 102 102  CO2 25 26 26 24 25   GLUCOSE 279* 195* 152* 145* 162*  BUN 5* <5* <5* <5* <5*  CREATININE 0.64 0.72 0.73 0.74 0.71  CALCIUM  9.2 9.1 8.7* 9.1 9.1  MG  --   --  1.4* 1.8 1.7  PHOS  --   --  2.6 2.4* 2.5     Recent Results (from the past 240 hours)  Culture, blood (routine x 2)     Status: None   Collection Time: 04/10/24  3:48 PM   Specimen: Right Antecubital; Blood  Result Value Ref Range Status   Specimen Description   Final    RIGHT ANTECUBITAL Performed at [facility], [address] [place], [place] [address]    Special Requests   Final    BOTTLES DRAWN AEROBIC AND ANAEROBIC Blood Culture adequate volume Performed at [facility], [address]  [place], [place] [address]    Culture   Final    NO GROWTH 5 DAYS Performed at [facility], [address] [place], [place] [address]    Report Status 04/15/2024 FINAL  Final  Culture, blood (routine x 2)     Status: None   Collection Time: 04/11/24  1:10 AM   Specimen: BLOOD LEFT ARM  Result Value Ref Range Status   Specimen Description   Final    BLOOD LEFT ARM Performed at [facility], [address], [place], [place] [address]    Special Requests   Final    BOTTLES DRAWN AEROBIC AND ANAEROBIC Blood Culture results may not be optimal due to an inadequate volume of blood received in culture bottles Performed at [facility], [address] [place], [place] [address]    Culture   Final  NO GROWTH 5 DAYS Performed at [facility], [address], [place], [place] [address]    Report Status 04/16/2024 FINAL  Final     Radiology Studies: CT ABDOMEN PELVIS W CONTRAST Result Date: 04/14/2024 EXAM: CT ABDOMEN AND PELVIS WITH CONTRAST 04/14/2024 12:16:51 PM TECHNIQUE: CT of the abdomen and pelvis was performed with the administration of 100 mL iohexol  (OMNIPAQUE ) 300 MG/ML solution. Multiplanar reformatted images are provided for review. Automated exposure control, iterative reconstruction, and/or weight-based adjustment of the mA/kV was utilized to reduce the radiation dose to as low as reasonably achievable. COMPARISON: Compared to a previous examination. CLINICAL HISTORY: Diverticulitis, complication suspected. FINDINGS: LOWER CHEST: Areas of subsegmental, platelike atelectasis are noted within both lung bases. LIVER: No suspicious liver abnormality. There is a low attenuation filling defect within the left hepatic lobe branch of the portal vein , extrahepatic portal vein, portal venous confluence, and smv concerning for thrombus. Several foci of gas appear trapped within the thrombus. GALLBLADDER AND BILE DUCTS: Gallbladder is unremarkable. No biliary  ductal dilatation. SPLEEN: The spleen is within normal limits in size and appearance. PANCREAS: The pancreas is normal in size and contour without focal lesion or ductal dilatation. ADRENAL GLANDS: Normal size and morphology bilaterally. No nodule, thickening, or hemorrhage. No periadrenal stranding. KIDNEYS, URETERS AND BLADDER: Nonobstructing left renal calculi identified. The largest is in the inferior pole measuring 8 mm, image 50/2. Bilateral Bosniak class 1 and 2 kidney cysts are identified. The largest arises off the anterior cortex of the right kidney measuring 3 cm, image 41/2. Per consensus, no follow-up is needed for simple Bosniak type 1 and 2 renal cysts, unless the patient has a malignancy history or risk factors. The left kidney is partially obscured by streak artifact from a left hip arthroplasty device. No hydronephrosis. No perinephric or periureteral stranding. Urinary bladder is unremarkable. GI AND BOWEL: Signs of acute, perforated diverticulitis involving the sigmoid colon is again noted as noted on the previous exam. There is a poorly defined gas and debris collection within the central pelvis adjacent to the sigmoid colon which tracts cranially up towards the left kidney terminating just medial to the inferior pole of the left kidney. This appears grossly unchanged compared with the previous exam. No well defined peripherally enhancing fluid collection identified at this time to suggest a drainable abscess. The extraluminal collection of gas and debris measures 6.7 x 4.8 cm, image 62/2. Sinus tract extending from this area towards the lower pole of the left kidney measures approximately 7 to 8 cm in length. The appendix is visualized and normal in caliber, without wall thickening, periappendiceal inflammation, or fluid. Stomach demonstrates no acute abnormality. There is no bowel obstruction. PERITONEUM AND RETROPERITONEUM: No ascites. No free air. VASCULATURE: Aorta is normal in caliber.  LYMPH NODES: No lymphadenopathy. REPRODUCTIVE ORGANS: No acute abnormality. BONES AND SOFT TISSUES: Left hip arthroplasty. Lumbar degenerative disc disease and facet arthropathy. No acute osseous abnormality. No focal soft tissue abnormality. IMPRESSION: 1. Acute, perforated diverticulitis involving the sigmoid colon with a poorly defined gas and debris collection in the central pelvis, grossly unchanged compared with the previous exam. No drainable abscess identified. 2. 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 3. Several small foci of gas identified within the portal venous system 4. Results were called to Dr. Clorinda Bury at the time of interpretation on 04/14/2024 at 12:38  pm. Electronically signed by: Waddell Calk MD 04/14/2024 12:39 PM EST RP Workstation: [id number]    Scheduled Meds:  apixaban   10 mg Oral BID   Followed by   NOREEN ON 04/22/2024] apixaban   5 mg Oral BID   arformoterol   15 mcg Nebulization BID   atorvastatin   20 mg Oral Daily   budesonide   0.5 mg Nebulization BID   insulin  aspart  0-15 Units Subcutaneous TID WC   insulin  aspart  0-5 Units Subcutaneous QHS   insulin  aspart  4 Units Subcutaneous TID WC   insulin  glargine-yfgn  15 Units Subcutaneous Daily   losartan   100 mg Oral Daily   metoprolol  succinate  100 mg Oral Daily   revefenacin   175 mcg Nebulization Daily   Continuous Infusions:  piperacillin -tazobactam (ZOSYN )  IV 3.375 g (04/16/24 0502)     LOS: 6 days   Ivonne Mustache, MD [facility] P11/25/2025, 10:29 AM

## 2024-04-16 NOTE — Progress Notes (Signed)
 Progress Note     Subjective: Patient reports improvement of pain. Tolerating soft diet. Had minimal nausea. Having bowel function with flatulence.   ROS  All negative with the exception of above.   Objective: Vital signs in last 24 hours: Temp:  [98.2 F (36.8 C)-98.4 F (36.9 C)] 98.2 F (36.8 C) (11/25 0445) Pulse Rate:  [78-92] 78 (11/25 0445) Resp:  [18] 18 (11/25 0445) BP: (152-190)/(84-91) 170/91 (11/25 0445) SpO2:  [88 %-97 %] 94 % (11/25 0929) Last BM Date : 04/15/24  Intake/Output from previous day: 11/24 0701 - 11/25 0700 In: 1670.7 [P.O.:1440; IV Piggyback:230.7] Out: 0  Intake/Output this shift: Total I/O In: 260.7 [P.O.:240; IV Piggyback:20.7] Out: -   PE: General: Female who is sitting in bedside chair in NAD. HEENT: Head is normocephalic, atraumatic.  Heart: Normal HR during encounter.  Lungs: Respiratory effort nonlabored. Abd: Soft, NT, ND. Bruising noted of left lower abdomen. No rebound tenderness or guarding.  MS: all 4 extremities are symmetrical with no cyanosis, clubbing, or edema. Skin: Warm and dry. Psych: A&Ox3 with an appropriate affect.    Lab Results:  Recent Labs    04/14/24 0514  WBC 5.7  HGB 14.5  HCT 43.8  PLT 160   BMET Recent Labs    04/14/24 0514  NA 136  K 3.6  CL 102  CO2 25  GLUCOSE 162*  BUN <5*  CREATININE 0.71  CALCIUM  9.1   PT/INR No results for input(s): LABPROT, INR in the last 72 hours. CMP     Component Value Date/Time   NA 136 04/14/2024 0514   K 3.6 04/14/2024 0514   CL 102 04/14/2024 0514   CO2 25 04/14/2024 0514   GLUCOSE 162 (H) 04/14/2024 0514   BUN <5 (L) 04/14/2024 0514   CREATININE 0.71 04/14/2024 0514   CALCIUM  9.1 04/14/2024 0514   PROT 7.3 04/11/2024 0110   ALBUMIN 3.0 (L) 04/14/2024 0514   AST 11 (L) 04/11/2024 0110   ALT 5 04/11/2024 0110   ALKPHOS 81 04/11/2024 0110   BILITOT 0.7 04/11/2024 0110   GFRNONAA >60 04/14/2024 0514   GFRAA >60 04/30/2019 0918   Lipase      Component Value Date/Time   LIPASE 28 04/10/2024 1548       Studies/Results: CT ABDOMEN PELVIS W CONTRAST Result Date: 04/14/2024 EXAM: CT ABDOMEN AND PELVIS WITH CONTRAST 04/14/2024 12:16:51 PM TECHNIQUE: CT of the abdomen and pelvis was performed with the administration of 100 mL iohexol  (OMNIPAQUE ) 300 MG/ML solution. Multiplanar reformatted images are provided for review. Automated exposure control, iterative reconstruction, and/or weight-based adjustment of the mA/kV was utilized to reduce the radiation dose to as low as reasonably achievable. COMPARISON: Compared to a previous examination. CLINICAL HISTORY: Diverticulitis, complication suspected. FINDINGS: LOWER CHEST: Areas of subsegmental, platelike atelectasis are noted within both lung bases. LIVER: No suspicious liver abnormality. There is a low attenuation filling defect within the left hepatic lobe branch of the portal vein , extrahepatic portal vein, portal venous confluence, and smv concerning for thrombus. Several foci of gas appear trapped within the thrombus. GALLBLADDER AND BILE DUCTS: Gallbladder is unremarkable. No biliary ductal dilatation. SPLEEN: The spleen is within normal limits in size and appearance. PANCREAS: The pancreas is normal in size and contour without focal lesion or ductal dilatation. ADRENAL GLANDS: Normal size and morphology bilaterally. No nodule, thickening, or hemorrhage. No periadrenal stranding. KIDNEYS, URETERS AND BLADDER: Nonobstructing left renal calculi identified. The largest is in the inferior pole measuring 8  mm, image 50/2. Bilateral Bosniak class 1 and 2 kidney cysts are identified. The largest arises off the anterior cortex of the right kidney measuring 3 cm, image 41/2. Per consensus, no follow-up is needed for simple Bosniak type 1 and 2 renal cysts, unless the patient has a malignancy history or risk factors. The left kidney is partially obscured by streak artifact from a left hip  arthroplasty device. No hydronephrosis. No perinephric or periureteral stranding. Urinary bladder is unremarkable. GI AND BOWEL: Signs of acute, perforated diverticulitis involving the sigmoid colon is again noted as noted on the previous exam. There is a poorly defined gas and debris collection within the central pelvis adjacent to the sigmoid colon which tracts cranially up towards the left kidney terminating just medial to the inferior pole of the left kidney. This appears grossly unchanged compared with the previous exam. No well defined peripherally enhancing fluid collection identified at this time to suggest a drainable abscess. The extraluminal collection of gas and debris measures 6.7 x 4.8 cm, image 62/2. Sinus tract extending from this area towards the lower pole of the left kidney measures approximately 7 to 8 cm in length. The appendix is visualized and normal in caliber, without wall thickening, periappendiceal inflammation, or fluid. Stomach demonstrates no acute abnormality. There is no bowel obstruction. PERITONEUM AND RETROPERITONEUM: No ascites. No free air. VASCULATURE: Aorta is normal in caliber. LYMPH NODES: No lymphadenopathy. REPRODUCTIVE ORGANS: No acute abnormality. BONES AND SOFT TISSUES: Left hip arthroplasty. Lumbar degenerative disc disease and facet arthropathy. No acute osseous abnormality. No focal soft tissue abnormality. IMPRESSION: 1. Acute, perforated diverticulitis involving the sigmoid colon with a poorly defined gas and debris collection in the central pelvis, grossly unchanged compared with the previous exam. No drainable abscess identified. 2. 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 3. Several small foci of gas identified within the portal venous system 4. Results were called to Dr. Clorinda Bury at the time of interpretation on 04/14/2024 at 12:38  pm. Electronically signed by: Waddell Calk MD 04/14/2024 12:39 PM EST RP Workstation: [id number]    Anti-infectives: Anti-infectives (From admission, onward)    Start     Dose/Rate Route Frequency Ordered Stop   04/10/24 1945  piperacillin -tazobactam (ZOSYN ) IVPB 3.375 g        3.375 g 12.5 mL/hr over 240 Minutes Intravenous Every 8 hours 04/10/24 1933     04/10/24 1800  cefTRIAXone  (ROCEPHIN ) 1 g in sodium chloride  0.9 % 100 mL IVPB        1 g 200 mL/hr over 30 Minutes Intravenous  Once 04/10/24 1755 04/10/24 1903   04/10/24 1800  metroNIDAZOLE  (FLAGYL ) IVPB 500 mg        500 mg 100 mL/hr over 60 Minutes Intravenous  Once 04/10/24 1755 04/10/24 1957        Assessment/Plan Diverticulitis with microperforation with abscess -CT from 11/23 showed no significant changes in diverticular disease. However, portal clot noted that seems to have been present on prior imaging. Hematology has consulted and Eliquis  has been initiated. Workup ongoing. -No tenderness noted on exam. -Again emphasized using PO pain medications. -WBC normal on 11/23. -Continue antibiotics. Will need to transition to PO antibiotics prior to discharge.  -Will continue to follow.  -Will also arrange her follow up.    FEN - Soft VTE - Elequis ID - Zosyn     LOS: 6 days  I reviewed consulting provider notes, specialist notes, nursing notes, last 24 h vitals and pain scores, last 48 h intake and output, last 24 h labs and trends, and last 24 h imaging results.  This care required moderate level of medical decision making.    Marjorie Carlyon Favre, [facility] 04/16/2024, 9:40 AM Please see Amion for pager number during day hours 7:00am-4:30pm

## 2024-04-16 NOTE — Plan of Care (Signed)
  Problem: Nutritional: Goal: Maintenance of adequate nutrition will improve Outcome: Adequate for Discharge   Problem: Clinical Measurements: Goal: Diagnostic test results will improve Outcome: Progressing

## 2024-04-16 NOTE — Progress Notes (Signed)
 This morning, Ms. Karan is doing okay.  She is on IV antibiotics.  She is on Eliquis  now.  To do the workup for her thrombus in the portal vein.  Again, she has no liver problems.  As far as I can tell, there is no blood in the family has had a thrombus.  She does not have sickle cell or sickle cell trait.  Her biggest concern is the diverticulitis.  She is on antibiotics for this.  Again, I have to think that this bacteremia that she had back in October and early November may be a factor with expected her diverticulitis.  Surgery apparently is not going to operate on her.  They would like to get antibiotics to see how that works.  She has had no problems eating.  She has to watch what she eats.  She has had no fever.  There is been no bleeding.  She has had no leg pain or swelling.  I will do the hypercoagulable workup on her.  I will also make sure we send off JAK2 analysis.  At this point in time, I am not sure what she is going to go home.  Again she is very worried about having to go home.   Jeralyn Crease, MD   Job 19:25

## 2024-04-17 ENCOUNTER — Other Ambulatory Visit: Payer: Self-pay

## 2024-04-17 ENCOUNTER — Other Ambulatory Visit (HOSPITAL_COMMUNITY): Payer: Self-pay

## 2024-04-17 DIAGNOSIS — K5792 Diverticulitis of intestine, part unspecified, without perforation or abscess without bleeding: Secondary | ICD-10-CM | POA: Diagnosis not present

## 2024-04-17 LAB — BASIC METABOLIC PANEL WITH GFR
Anion gap: 11 (ref 5–15)
BUN: 11 mg/dL (ref 8–23)
CO2: 24 mmol/L (ref 22–32)
Calcium: 9.8 mg/dL (ref 8.9–10.3)
Chloride: 99 mmol/L (ref 98–111)
Creatinine, Ser: 0.82 mg/dL (ref 0.44–1.00)
GFR, Estimated: >60 mL/min (ref >60)
Glucose, Bld: 212 mg/dL — ABNORMAL HIGH (ref 70–99)
Potassium: 3.9 mmol/L (ref 3.5–5.1)
Sodium: 134 mmol/L — ABNORMAL LOW (ref 135–145)

## 2024-04-17 LAB — CBC
HCT: 45.7 % (ref 36.0–46.0)
Hemoglobin: 14.8 g/dL (ref 12.0–15.0)
MCH: 31.4 pg (ref 26.0–34.0)
MCHC: 32.4 g/dL (ref 30.0–36.0)
MCV: 96.8 fL (ref 80.0–100.0)
Platelets: 172 K/uL (ref 150–400)
RBC: 4.72 MIL/uL (ref 3.87–5.11)
RDW: 13 % (ref 11.5–15.5)
WBC: 7.7 K/uL (ref 4.0–10.5)
nRBC: 0 % (ref 0.0–0.2)

## 2024-04-17 LAB — GLUCOSE, CAPILLARY: Glucose-Capillary: 182 mg/dL — ABNORMAL HIGH (ref 70–99)

## 2024-04-17 LAB — HOMOCYSTEINE: Homocysteine: 13.8 umol/L (ref 0.0–17.2)

## 2024-04-17 MED ORDER — AMOXICILLIN-POT CLAVULANATE 875-125 MG PO TABS
1.0000 | ORAL_TABLET | Freq: Two times a day (BID) | ORAL | 0 refills | Status: DC
  Filled 2024-04-17: qty 8, 4d supply, fill #0

## 2024-04-17 MED ORDER — METRONIDAZOLE 500 MG PO TABS
500.0000 mg | ORAL_TABLET | Freq: Two times a day (BID) | ORAL | 0 refills | Status: AC
  Filled 2024-04-17: qty 8, 4d supply, fill #0

## 2024-04-17 MED ORDER — APIXABAN 5 MG PO TABS
ORAL_TABLET | ORAL | 0 refills | Status: DC
  Filled 2024-04-17: qty 70, 30d supply, fill #0

## 2024-04-17 MED ORDER — OXYCODONE HCL 5 MG PO TABS: 5.0000 mg | ORAL_TABLET | Freq: Four times a day (QID) | ORAL | 0 refills | Status: DC | PRN

## 2024-04-17 MED ORDER — AMOXICILLIN-POT CLAVULANATE 875-125 MG PO TABS
1.0000 | ORAL_TABLET | Freq: Two times a day (BID) | ORAL | Status: DC
Start: 2024-04-17 — End: 2024-04-17

## 2024-04-17 MED ORDER — HYDRALAZINE HCL 25 MG PO TABS
25.0000 mg | ORAL_TABLET | Freq: Three times a day (TID) | ORAL | Status: DC
  Administered 2024-04-17: 25 mg via ORAL
  Filled 2024-04-17: qty 1

## 2024-04-17 MED ORDER — CIPROFLOXACIN HCL 500 MG PO TABS
750.0000 mg | ORAL_TABLET | Freq: Two times a day (BID) | ORAL | 0 refills | Status: AC
  Filled 2024-04-17: qty 12, 4d supply, fill #0

## 2024-04-17 MED ORDER — METRONIDAZOLE 500 MG PO TABS: 500.0000 mg | ORAL_TABLET | Freq: Two times a day (BID) | ORAL | Status: DC

## 2024-04-17 MED ORDER — HYDRALAZINE HCL 25 MG PO TABS
25.0000 mg | ORAL_TABLET | Freq: Three times a day (TID) | ORAL | 0 refills | Status: AC
  Filled 2024-04-17: qty 90, 30d supply, fill #0

## 2024-04-17 MED ORDER — LOSARTAN POTASSIUM 100 MG PO TABS
100.0000 mg | ORAL_TABLET | Freq: Every day | ORAL | 0 refills | Status: AC
  Filled 2024-04-17: qty 30, 30d supply, fill #0

## 2024-04-17 MED ORDER — METOPROLOL SUCCINATE ER 100 MG PO TB24
100.0000 mg | ORAL_TABLET | Freq: Every day | ORAL | 0 refills | Status: AC
  Filled 2024-04-17: qty 30, 30d supply, fill #0

## 2024-04-17 MED ORDER — CIPROFLOXACIN HCL 500 MG PO TABS: 750.0000 mg | ORAL_TABLET | Freq: Two times a day (BID) | ORAL | Status: DC

## 2024-04-17 NOTE — Progress Notes (Signed)
 Discharge medications delivered to patient at the bedside.

## 2024-04-17 NOTE — Progress Notes (Addendum)
 Progress Note     Subjective: Patient reports abdominal pain is manageable. Tolerating soft diet without n/v. Had BM this morning and flatulence.  ROS  All negative with the exception of above.  Objective: Vital signs in last 24 hours: Temp:  [98.2 F (36.8 C)-98.4 F (36.9 C)] 98.4 F (36.9 C) (11/26 0454) Pulse Rate:  [71-81] 71 (11/26 0454) Resp:  [16-20] 20 (11/26 0454) BP: (149-169)/(89-118) 166/118 (11/26 0454) SpO2:  [93 %-98 %] 98 % (11/26 0454) Last BM Date : 04/15/24  Intake/Output from previous day: 11/25 0701 - 11/26 0700 In: 1333.9 [P.O.:1200; IV Piggyback:133.9] Out: -  Intake/Output this shift: Total I/O In: 277.1 [P.O.:240; IV Piggyback:37.1] Out: -   PE: General: Female who is sitting in bedside chair in NAD. HEENT: Head is normocephalic, atraumatic.  Heart: Normal HR during encounter.  Lungs: Respiratory effort nonlabored. Abd: Soft, NT, ND. Bruising noted of left lower abdomen. No rebound tenderness or guarding.  MS: All 4 extremities are symmetrical with no cyanosis, clubbing, or edema. Skin: Warm and dry. Psych: A&Ox3 with an appropriate affect.    Lab Results:  Recent Labs    04/17/24 0503  WBC 7.7  HGB 14.8  HCT 45.7  PLT 172   BMET Recent Labs    04/17/24 0503  NA 134*  K 3.9  CL 99  CO2 24  GLUCOSE 212*  BUN 11  CREATININE 0.82  CALCIUM  9.8   PT/INR No results for input(s): LABPROT, INR in the last 72 hours. CMP     Component Value Date/Time   NA 134 (L) 04/17/2024 0503   K 3.9 04/17/2024 0503   CL 99 04/17/2024 0503   CO2 24 04/17/2024 0503   GLUCOSE 212 (H) 04/17/2024 0503   BUN 11 04/17/2024 0503   CREATININE 0.82 04/17/2024 0503   CALCIUM  9.8 04/17/2024 0503   PROT 7.3 04/11/2024 0110   ALBUMIN 3.0 (L) 04/14/2024 0514   AST 11 (L) 04/11/2024 0110   ALT 5 04/11/2024 0110   ALKPHOS 81 04/11/2024 0110   BILITOT 0.7 04/11/2024 0110   GFRNONAA >60 04/17/2024 0503   GFRAA >60 04/30/2019 0918   Lipase      Component Value Date/Time   LIPASE 28 04/10/2024 1548       Studies/Results: No results found.  Anti-infectives: Anti-infectives (From admission, onward)    Start     Dose/Rate Route Frequency Ordered Stop   04/10/24 1945  piperacillin -tazobactam (ZOSYN ) IVPB 3.375 g        3.375 g 12.5 mL/hr over 240 Minutes Intravenous Every 8 hours 04/10/24 1933     04/10/24 1800  cefTRIAXone  (ROCEPHIN ) 1 g in sodium chloride  0.9 % 100 mL IVPB        1 g 200 mL/hr over 30 Minutes Intravenous  Once 04/10/24 1755 04/10/24 1903   04/10/24 1800  metroNIDAZOLE  (FLAGYL ) IVPB 500 mg        500 mg 100 mL/hr over 60 Minutes Intravenous  Once 04/10/24 1755 04/10/24 1957        Assessment/Plan Diverticulitis with microperforation with abscess -CT from 11/23 showed no significant changes in diverticular disease. However, portal clot noted that seems to have been present on prior imaging. Hematology has consulted and Eliquis  has been initiated. Workup ongoing. -No tenderness noted on exam. -Pain manageable. -CBC WNL -Continue antibiotics. Will need to transition to PO antibiotics prior to discharge (10 day course). -Discussed with attending need for colonoscopy as patient had one completed this year (09/2023). Advised  that patient get repeat colonoscopy and follow up afterwards with colorectal surgeon. Information relayed to patient and she expressed understanding. -General surgery will sign off. Please call for further questions and concerns.   FEN - Soft VTE - Eliquis  ID - [email or web address]  (Can transition to PO antibiotics at discharge for 10 day course).    LOS: 7 days   I reviewed nursing notes, specialist notes, consulting provider notes, hospitalist notes, last 24 h vitals and pain scores, last 48 h intake and output, last 24 h labs and trends, and last 24 h imaging results.  This care required moderate level of medical decision making.    Marjorie Carlyon Favre, [facility]  Surgery 04/17/2024, 8:36 AM Please see Amion for pager number during day hours 7:00am-4:30pm

## 2024-04-17 NOTE — Discharge Summary (Addendum)
 Physician Discharge Summary  Lisa Ibarra FMW:[id number] DOB: Oct 06, 1960 DOA: 04/10/2024  PCP: Cesario Mutton, MD  Admit date: 04/10/2024 Discharge date: 04/17/2024  Admitted From: Home Disposition:  Home  Discharge Condition:Stable CODE STATUS:FULL Diet recommendation: Carb Modified   Brief/Interim Summary: Patient is a 63 year old female with history of COPD, HFpEF, diabetes type 2, chronic pain syndrome, hypertension, tobacco use disorder, recent hospitalization for sigmoid diverticulitis and was managed conservatively presented with worsening abdominal pain, nausea, vomiting.  Found to have acute complicated sigmoid diverticulitis with microperforation and possible abscess.  Started on IV antibiotics.General surgery consulted.  Follow-up CT abdomen/pelvis on 11/23 showed portal vein thrombosis, hematology consulted.  Started on Eliquis .Hypercoagulable panel workup sent. She has clinically improved.Tolerating soft diet.  Having bowel movements. General surgery gave  clearance for dc.  She is really eager to go home today.  Medically stable for discharge home today.  She will follow-up with general surgery, hematology as an outpatient.  Following problems were addressed during the hospitalization:  Acute complicated sigmoid diverticulitis with microperforation/possible abscess: Was recently admitted and was managed conservatively for the same.  Presented back with worsening abdominal pain, nausea, vomiting.  CT abdomen/pelvis as above.  Blood cultures have been negative so far.   Clinically improved now.  Tolerating soft diet and having bowel movements.  Plan to change antibiotics to oral on discharge.  She will follow-up with general surgery as an outpatient   Portal vein thrombosis: Incidental finding.  Hematology consulted to restart Eliquis .  Hematology sent hypercoagulable workup, including  JAK2 analysis.  She will follow-up with hematology as an outpatient.   Chronic HFpEF: TTE on 10/30  showed EF of 55 to 60%, grade 1 diastolic dysfunction.  On Lasix  as needed at home.   Continue Toprol ,losartan .  Has bilateral lower extremity pitting edema today.  Given her dose of Lasix  40 mg once on 11/25 with improvement in the lower extremity edema.   COPD: Currently stable without any exacerbation.   Diabetes type 2: Recent A1c of 7 as per 10/30.  Ozempic at home.  Blood sugar slightly on the higher range here.  We have instructed the patient to monitor blood sugars at home and discuss  with the PCP for  further management of her diabetes.   Hypertension:  Dose of losartan  and Toprol  have been maximized. Added hydralazine .  Advised the patient to monitor blood pressure at home.   Tobacco use disorder: C Counseled for cessation   Hypokalemia/hypomagnesemia: Stable now  Hyponatremia: Resolved   Morbid obesity: Weight of 130 kg   Discharge Diagnoses:  Principal Problem:   Diverticulitis    Discharge Instructions  Discharge Instructions     Diet Carb Modified   Complete by: As directed    Discharge instructions   Complete by: As directed    1)Please take your medications as instructed 2)Monitor your blood pressure and blood sugars at home 3)Follow up with your PCP in a week 4)Follow up with general surgery as an outpatient on the given appointment date 5)You will be called by hematology for follow up appointment   Increase activity slowly   Complete by: As directed       Allergies as of 04/17/2024       Reactions   Bee Venom Anaphylaxis, Hives   Empagliflozin Itching, Rash, Dermatitis   Ibuprofen Nausea And Vomiting   Statins Nausea Only, Other (See Comments)   Muscle spasms and cramps, too        Medication List  TAKE these medications    albuterol  108 (90 Base) MCG/ACT inhaler Commonly known as: VENTOLIN  HFA Inhale 2 puffs into the lungs every 6 (six) hours as needed for wheezing or shortness of breath.   arformoterol  15 MCG/2ML Nebu Commonly  known as: BROVANA  Take 2 mLs (15 mcg total) by nebulization 2 (two) times daily.   atorvastatin  20 MG tablet Commonly known as: LIPITOR Take 20 mg by mouth daily.   blood glucose meter kit and supplies Kit Dispense based on patient and insurance preference. Use up to four times daily as directed.   budesonide  0.5 MG/2ML nebulizer solution Commonly known as: PULMICORT  Take 2 mLs (0.5 mg total) by nebulization 2 (two) times daily.   ciprofloxacin  500 MG tablet Commonly known as: Cipro  Take 1.5 tablets (750 mg total) by mouth 2 (two) times daily for 4 days.   Eliquis  5 MG Tabs tablet Generic drug: apixaban  Take 2 tablets (10 mg total) by mouth 2 (two) times daily for 5 days, THEN 1 tablet (5 mg total) 2 (two) times daily. Start taking on: April 17, 2024   furosemide  20 MG tablet Commonly known as: LASIX  Take 20-40 mg by mouth daily as needed for fluid or edema.   [employer] Extra Strength [id number] MG Pack Generic drug: Aspirin-Acetaminophen -Caffeine  Take 1 packet by mouth See admin instructions. Dissolve 1 packet in the mouth up to six times a day as needed for pain   hydrALAZINE  25 MG tablet Commonly known as: APRESOLINE  Take 1 tablet (25 mg total) by mouth every 8 (eight) hours.   Insulin  Pen Needle 29G X Misc For insulin  injection   losartan  100 MG tablet Commonly known as: COZAAR  Take 1 tablet (100 mg total) by mouth daily. Start taking on: April 18, 2024 What changed:  medication strength how much to take   metoprolol  succinate 100 MG 24 hr tablet Commonly known as: TOPROL -XL Take 1 tablet (100 mg total) by mouth daily. Take with or immediately following a meal. Start taking on: April 18, 2024 What changed:  medication strength how much to take when to take this additional instructions   metroNIDAZOLE  500 MG tablet Commonly known as: FLAGYL  Take 1 tablet (500 mg total) by mouth 2 (two) times daily for 4 days.   oxyCODONE  5 MG immediate release  tablet Commonly known as: Oxy IR/ROXICODONE  Take 1 tablet (5 mg total) by mouth every 6 (six) hours as needed for moderate pain (pain score 4-6), severe pain (pain score 7-10) or breakthrough pain. What changed: when to take this   Ozempic (0.25 or 0.5 MG/DOSE) 2 MG/3ML Sopn Generic drug: Semaglutide(0.25 or 0.5MG /DOS) Inject 0.25 mg into the skin every Sunday.   polyethylene glycol 17 g packet Commonly known as: MIRALAX  / GLYCOLAX  Take 17 g by mouth daily.   potassium chloride  SA 20 MEQ tablet Commonly known as: KLOR-CON  M Take 20 mEq by mouth daily.   revefenacin  175 MCG/3ML nebulizer solution Commonly known as: YUPELRI  Take 3 mLs (175 mcg total) by nebulization daily.   senna-docusate 8.6-50 MG tablet Commonly known as: Senokot-S Take 1 tablet by mouth at bedtime as needed for mild constipation.         Follow-up Information     Debby Hila, MD. Go on 07/08/2024.   Specialties: General Surgery, Colon and Rectal Surgery Why: At 9:00AM. This appointment should be after your colonoscopy has been completed with your GI provider. Contact information: [address] [place] [address] [phone]  Cesario Mutton, MD. Schedule an appointment as soon as possible for a visit in 1 week(s).   Specialty: Internal Medicine Contact information: [phone] [place] [address] [phone]                Allergies  Allergen Reactions   Bee Venom Anaphylaxis and Hives   Empagliflozin Itching, Rash and Dermatitis   Ibuprofen Nausea And Vomiting   Statins Nausea Only and Other (See Comments)    Muscle spasms and cramps, too    Consultations: General surgery, hematology   Procedures/Studies: CT ABDOMEN PELVIS W CONTRAST Result Date: 04/14/2024 EXAM: CT ABDOMEN AND PELVIS WITH CONTRAST 04/14/2024 12:16:51 PM TECHNIQUE: CT of the abdomen and pelvis was performed with the administration of 100 mL iohexol  (OMNIPAQUE ) 300  MG/ML solution. Multiplanar reformatted images are provided for review. Automated exposure control, iterative reconstruction, and/or weight-based adjustment of the mA/kV was utilized to reduce the radiation dose to as low as reasonably achievable. COMPARISON: Compared to a previous examination. CLINICAL HISTORY: Diverticulitis, complication suspected. FINDINGS: LOWER CHEST: Areas of subsegmental, platelike atelectasis are noted within both lung bases. LIVER: No suspicious liver abnormality. There is a low attenuation filling defect within the left hepatic lobe branch of the portal vein , extrahepatic portal vein, portal venous confluence, and smv concerning for thrombus. Several foci of gas appear trapped within the thrombus. GALLBLADDER AND BILE DUCTS: Gallbladder is unremarkable. No biliary ductal dilatation. SPLEEN: The spleen is within normal limits in size and appearance. PANCREAS: The pancreas is normal in size and contour without focal lesion or ductal dilatation. ADRENAL GLANDS: Normal size and morphology bilaterally. No nodule, thickening, or hemorrhage. No periadrenal stranding. KIDNEYS, URETERS AND BLADDER: Nonobstructing left renal calculi identified. The largest is in the inferior pole measuring 8 mm, image 50/2. Bilateral Bosniak class 1 and 2 kidney cysts are identified. The largest arises off the anterior cortex of the right kidney measuring 3 cm, image 41/2. Per consensus, no follow-up is needed for simple Bosniak type 1 and 2 renal cysts, unless the patient has a malignancy history or risk factors. The left kidney is partially obscured by streak artifact from a left hip arthroplasty device. No hydronephrosis. No perinephric or periureteral stranding. Urinary bladder is unremarkable. GI AND BOWEL: Signs of acute, perforated diverticulitis involving the sigmoid colon is again noted as noted on the previous exam. There is a poorly defined gas and debris collection within the central pelvis adjacent to  the sigmoid colon which tracts cranially up towards the left kidney terminating just medial to the inferior pole of the left kidney. This appears grossly unchanged compared with the previous exam. No well defined peripherally enhancing fluid collection identified at this time to suggest a drainable abscess. The extraluminal collection of gas and debris measures 6.7 x 4.8 cm, image 62/2. Sinus tract extending from this area towards the lower pole of the left kidney measures approximately 7 to 8 cm in length. The appendix is visualized and normal in caliber, without wall thickening, periappendiceal inflammation, or fluid. Stomach demonstrates no acute abnormality. There is no bowel obstruction. PERITONEUM AND RETROPERITONEUM: No ascites. No free air. VASCULATURE: Aorta is normal in caliber. LYMPH NODES: No lymphadenopathy. REPRODUCTIVE ORGANS: No acute abnormality. BONES AND SOFT TISSUES: Left hip arthroplasty. Lumbar degenerative disc disease and facet arthropathy. No acute osseous abnormality. No focal soft tissue abnormality. IMPRESSION: 1. Acute, perforated diverticulitis involving the sigmoid colon with a poorly defined gas and debris collection in the central pelvis, grossly  unchanged compared with the previous exam. No drainable abscess identified. 2. 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 3. Several small foci of gas identified within the portal venous system 4. Results were called to Dr. Clorinda Bury at the time of interpretation on 04/14/2024 at 12:38 pm. Electronically signed by: Waddell Calk MD 04/14/2024 12:39 PM EST RP Workstation: [id number]   CT ABDOMEN PELVIS W CONTRAST Result Date: 04/10/2024 EXAM: CT ABDOMEN AND PELVIS WITH CONTRAST 04/10/2024 05:13:38 PM TECHNIQUE: CT of the abdomen and pelvis was performed with the administration of 125 mL of iohexol  (OMNIPAQUE ) 300  MG/ML solution. Multiplanar reformatted images are provided for review. Automated exposure control, iterative reconstruction, and/or weight-based adjustment of the mA/kV was utilized to reduce the radiation dose to as low as reasonably achievable. COMPARISON: CT abdomen and pelvis 03/25/2024. CLINICAL HISTORY: Diverticulitis, complication suspected. FINDINGS: LOWER CHEST: No acute abnormality. LIVER: The liver is unremarkable. GALLBLADDER AND BILE DUCTS: Gallbladder is unremarkable. No biliary ductal dilatation. SPLEEN: No acute abnormality. PANCREAS: No acute abnormality. ADRENAL GLANDS: No acute abnormality. KIDNEYS, URETERS AND BLADDER: No stones in the kidneys or ureters. No hydronephrosis. No perinephric or periureteral stranding. Urinary bladder is unremarkable. GI AND BOWEL: Persistent mid distal sigmoid diverticulitis with mild bowel thickening and pericolonic fat stranding superiorly. Microperforation is noted and best visualized on coronal images (8:111). There is no bowel obstruction. PERITONEUM AND RETROPERITONEUM: Interval development of a fluid and gas collection within the mid abdomen superior to the mid sigmoid colon (8:11). No free air. VASCULATURE: Aorta is normal in caliber. LYMPH NODES: No lymphadenopathy. REPRODUCTIVE ORGANS: No acute abnormality. BONES AND SOFT TISSUES: No acute osseous abnormality. No focal soft tissue abnormality. Intervertebral disc space vacuum phenomenon. Grade 1 anterolisthesis of L4 on L5. Grade 1 anterolisthesis of L3 on L4. To a superior endplate concavity likely old healed fracture. IMPRESSION: 1. Acute complicated diverticulitis of the mid-distal sigmoid colon with microperforation and associated 5.4 x 6.1 x 9.2cm gas and fluid collection formation. Recommend colonoscopy status post treatment and status post complete resolution of inflammatory changes to exclude an underlying lesion. Electronically signed by: Morgane Naveau MD 04/10/2024 05:45 PM EST RP Workstation:  [id number]   DG Chest Portable 1 View Result Date: 04/10/2024 EXAM: 1 VIEW(S) XRAY OF THE CHEST 04/10/2024 03:30:00 PM COMPARISON: Comparison 03/22/2024. Stable cardiomediastinal silhouette. CLINICAL HISTORY: cp cp FINDINGS: LUNGS AND PLEURA: No focal pulmonary opacity. No pleural effusion. No pneumothorax. HEART AND MEDIASTINUM: Stable cardiomediastinal silhouette. BONES AND SOFT TISSUES: No acute osseous abnormality. IMPRESSION: 1. No acute cardiopulmonary disease. Electronically signed by: Lynwood Seip MD 04/10/2024 03:39 PM EST RP Workstation: [id number]   CT ABDOMEN PELVIS W CONTRAST Result Date: 03/25/2024 EXAM: CT ABDOMEN AND PELVIS WITH CONTRAST 03/25/2024 09:55:59 PM TECHNIQUE: CT of the abdomen and pelvis was performed with the administration of 100 mL of iohexol  (OMNIPAQUE ) 350 MG/ML injection. Multiplanar reformatted images are provided for review. Automated exposure control, iterative reconstruction, and/or weight-based adjustment of the mA/kV was utilized to reduce the radiation dose to as low as reasonably achievable. COMPARISON: 04/01/2015 CLINICAL HISTORY: Abdominal pain, acute, nonlocalized. FINDINGS: LOWER CHEST: Basilar atelectasis. LIVER: The liver is unremarkable. GALLBLADDER AND BILE DUCTS: Gallbladder is unremarkable. No biliary ductal dilatation. SPLEEN: No acute abnormality. PANCREAS: No acute abnormality. ADRENAL GLANDS: Stable small nodules in the adrenal glands bilaterally, most compatible with adenomas. KIDNEYS, URETERS AND BLADDER: 1 m. m nonobstructing stone in  the upper pole of the left kidney. 8 mm nonobstructing stone in the lower pole of the left kidney. No hydronephrosis. The urinary bladder is obscured by beam hardening artifact from the left hip replacement. Small bilateral renal cysts appear benign. Per consensus, no follow-up is needed for simple Bosniak type 1 and 2 renal cysts, unless the patient has a malignancy history or risk factors. No perinephric or periureteral  stranding. GI AND BOWEL: Stomach demonstrates no acute abnormality. Sigmoid diverticulosis. Inflammatory stranding around the mid sigmoid colon compatible with active diverticulitis. There is no bowel obstruction. PERITONEUM AND RETROPERITONEUM: No ascites. No free air. VASCULATURE: Aorta is normal in caliber. Aortic atherosclerosis. LYMPH NODES: No lymphadenopathy. REPRODUCTIVE ORGANS: No acute abnormality. BONES AND SOFT TISSUES: Prior left hip replacement. Degenerative changes in the lumbar spine. No acute bony abnormality. No focal soft tissue abnormality. IMPRESSION: 1. Sigmoid diverticulosis with active diverticulitis in the mid sigmoid colon. 2. Nonobstructing left nephrolithiasis. Electronically signed by: Franky Crease MD 03/25/2024 10:24 PM EST RP Workstation: [id number]   CT Angio Chest PE W and/or Wo Contrast Result Date: 03/25/2024 EXAM: CTA of the Chest with contrast for PE 03/25/2024 09:55:59 PM TECHNIQUE: CTA of the chest was performed after the administration of 100 mL of iohexol  (OMNIPAQUE ) 350 MG/ML injection. Multiplanar reformatted images are provided for review. MIP images are provided for review. Automated exposure control, iterative reconstruction, and/or weight based adjustment of the mA/kV was utilized to reduce the radiation dose to as low as reasonably achievable. COMPARISON: 08/18/2021 CLINICAL HISTORY: Pulmonary embolism (PE) suspected, high prob. FINDINGS: PULMONARY ARTERIES: Pulmonary arteries are adequately opacified for evaluation. No pulmonary embolism. Main pulmonary artery is normal in caliber. MEDIASTINUM: The heart and pericardium demonstrate no acute abnormality. Scattered coronary artery and aortic atherosclerosis. There is no acute abnormality of the thoracic aorta. LYMPH NODES: No mediastinal, hilar or axillary lymphadenopathy. LUNGS AND PLEURA: Areas of atelectasis in the mid and lower lungs. No focal consolidation or pulmonary edema. No pleural effusion or pneumothorax.  UPPER ABDOMEN: Limited images of the upper abdomen are unremarkable. SOFT TISSUES AND BONES: No acute bone or soft tissue abnormality. IMPRESSION: 1. No pulmonary embolism. 2. Areas of atelectasis in the mid and lower lungs. No effusions. Electronically signed by: Franky Crease MD 03/25/2024 10:19 PM EST RP Workstation: [id number]   ECHOCARDIOGRAM COMPLETE Result Date: 03/22/2024    ECHOCARDIOGRAM REPORT   Patient Name:   BRYNA RAZAVI [facility] Date of Exam: 03/22/2024 Medical Rec #:  [id number]     Height:       69.0 in Accession #:    [id number]    Weight:       286.8 lb Date of Birth:  02/10/1961     BSA:          2.408 m Patient Age:    63 years      BP:           144/76 mmHg Patient Gender: F             HR:           84 bpm. Exam Location:  Inpatient Procedure: 2D Echo, Color Doppler and Cardiac Doppler (Both Spectral and Color            Flow Doppler were utilized during procedure). Indications:    CHF I50.31  History:        Patient has no prior history of Echocardiogram examinations.  Risk Factors:Diabetes and Hypertension.  Sonographer:    Tinnie Gosling RDCS Referring Phys: [phone] WHITNEY D HARRIS IMPRESSIONS  1. This was a very limited study due to poor acoustic windows and lack of echo contrast. In limited views, the LV systolic function appears preserved without focal wall motion abnormalities.  2. Left ventricular ejection fraction, by estimation, is 55 to 60%. The left ventricle has normal function. The left ventricle has no regional wall motion abnormalities. Not well visualized left ventricular hypertrophy. Left ventricular diastolic parameters are consistent with Grade I diastolic dysfunction (impaired relaxation).  3. Right ventricular systolic function is normal. The right ventricular size is not well visualized. Tricuspid regurgitation signal is inadequate for assessing PA pressure.  4. The mitral valve is normal in structure. No evidence of mitral valve regurgitation. No evidence of  mitral stenosis.  5. The aortic valve was not well visualized. Aortic valve regurgitation is not visualized. No aortic stenosis is present.  6. The inferior vena cava is dilated in size with <50% respiratory variability, suggesting right atrial pressure of 15 mmHg. Comparison(s): No prior Echocardiogram. Conclusion(s)/Recommendation(s): Very limited study due to poor acoustic windows, but the LV systolic function appears normal. The RV was poorly visualized. Recommend repeat echocardiography with the use of echo contrast if better assessment of the RV function is necessary. FINDINGS  Left Ventricle: Left ventricular ejection fraction, by estimation, is 55 to 60%. The left ventricle has normal function. The left ventricle has no regional wall motion abnormalities. The left ventricular internal cavity size was normal in size. Not well  visualized left ventricular hypertrophy. Left ventricular diastolic parameters are consistent with Grade I diastolic dysfunction (impaired relaxation). Right Ventricle: The right ventricular size is not well visualized. Right vetricular wall thickness was not well visualized. Right ventricular systolic function is normal. Tricuspid regurgitation signal is inadequate for assessing PA pressure. Left Atrium: Left atrial size was normal in size. Right Atrium: Right atrial size was normal in size. Pericardium: There is no evidence of pericardial effusion. Mitral Valve: The mitral valve is normal in structure. No evidence of mitral valve regurgitation. No evidence of mitral valve stenosis. Tricuspid Valve: The tricuspid valve is not well visualized. Tricuspid valve regurgitation is not demonstrated. No evidence of tricuspid stenosis. Aortic Valve: The aortic valve was not well visualized. Aortic valve regurgitation is not visualized. No aortic stenosis is present. Pulmonic Valve: The pulmonic valve was not well visualized. Pulmonic valve regurgitation is not visualized. No evidence of  pulmonic stenosis. Aorta: The aortic root and ascending aorta are structurally normal, with no evidence of dilitation. Venous: The inferior vena cava is dilated in size with less than 50% respiratory variability, suggesting right atrial pressure of 15 mmHg. IAS/Shunts: The interatrial septum was not well visualized.  LEFT VENTRICLE PLAX 2D LVIDd:         5.00 cm   Diastology LVIDs:         3.80 cm   LV e' medial:    5.44 cm/s LV PW:         1.40 cm   LV E/e' medial:  17.4 LV IVS:        1.40 cm   LV e' lateral:   7.51 cm/s LVOT diam:     2.10 cm   LV E/e' lateral: 12.6 LV SV:         93 LV SV Index:   39 LVOT Area:     3.46 cm LV IVRT:       108 msec  RIGHT VENTRICLE             IVC RV S prime:     11.40 cm/s  IVC diam: 2.70 cm TAPSE (M-mode): 2.5 cm                             PULMONARY VEINS                             Diastolic Velocity: 27.50 cm/s                             S/D Velocity:       1.00                             Systolic Velocity:  27.90 cm/s LEFT ATRIUM             Index        RIGHT ATRIUM           Index LA diam:        3.90 cm 1.62 cm/m   RA Area:     17.00 cm LA Vol (A2C):   32.1 ml 13.33 ml/m  RA Volume:   46.10 ml  19.14 ml/m LA Vol (A4C):   44.1 ml 18.31 ml/m LA Biplane Vol: 38.0 ml 15.78 ml/m  AORTIC VALVE LVOT Vmax:   125.00 cm/s LVOT Vmean:  99.800 cm/s LVOT VTI:    0.269 m  AORTA Ao Root diam: 3.30 cm Ao Asc diam:  3.30 cm MITRAL VALVE MV Area (PHT): 2.54 cm     SHUNTS MV Decel Time: 299 msec     Systemic VTI:  0.27 m MV E velocity: 94.70 cm/s   Systemic Diam: 2.10 cm MV A velocity: 114.00 cm/s MV E/A ratio:  0.83 Georganna Archer Electronically signed by Georganna Archer Signature Date/Time: 03/22/2024/12:54:44 PM    Final    DG Chest Port 1 View Result Date: 03/21/2024 CLINICAL DATA:  Possible sepsis.  Fever, cough and body aches. EXAM: PORTABLE CHEST 1 VIEW COMPARISON:  07/25/2022 FINDINGS: Lungs are somewhat hypoinflated without focal lobar consolidation or effusion.  Subtle hazy prominence of the central pulmonary vessels likely due to mild vascular congestion. Cardiomediastinal silhouette and remainder of the exam is unchanged. IMPRESSION: Suggestion of mild vascular congestion. Electronically Signed   By: Toribio Agreste M.D.   On: 03/21/2024 13:48      Subjective: Patient seen and examined at bedside today.  Hemodynamically stable.  Sitting in the chair.  Really eager to go home.  Any abdomen pain, nausea vomiting.  Tolerating soft diet.  Medically stable for discharge.I called and discussed discharge planning with her daughter Doyce on phone  Discharge Exam: Vitals:   04/17/24 0837 04/17/24 0838  BP:    Pulse:    Resp:    Temp:    SpO2: 94% 94%   Vitals:   04/16/24 2025 04/17/24 0454 04/17/24 0837 04/17/24 0838  BP: (!) 149/89 (!) 166/118    Pulse: 79 71    Resp: 20 20    Temp: 98.4 F (36.9 C) 98.4 F (36.9 C)    TempSrc: Oral Oral    SpO2: 95% 98% 94% 94%    General: Pt is alert, awake, not in acute distress Cardiovascular: RRR, S1/S2 +, no rubs, no gallops Respiratory:  CTA bilaterally, no wheezing, no rhonchi Abdominal: Soft, NT, ND, bowel sounds + Extremities: no edema, no cyanosis    The results of significant diagnostics from this hospitalization (including imaging, microbiology, ancillary and laboratory) are listed below for reference.     Microbiology: Recent Results (from the past 240 hours)  Culture, blood (routine x 2)     Status: None   Collection Time: 04/10/24  3:48 PM   Specimen: Right Antecubital; Blood  Result Value Ref Range Status   Specimen Description   Final    RIGHT ANTECUBITAL Performed at [facility], [address] [place], [place] [address]    Special Requests   Final    BOTTLES DRAWN AEROBIC AND ANAEROBIC Blood Culture adequate volume Performed at [facility], [address] [place], [place] [address]    Culture   Final    NO GROWTH 5 DAYS Performed at [facility], [address], [place], [place] [address]    Report Status 04/15/2024 FINAL  Final  Culture, blood (routine x 2)     Status: None   Collection Time: 04/11/24  1:10 AM   Specimen: BLOOD LEFT ARM  Result Value Ref Range Status   Specimen Description   Final    BLOOD LEFT ARM Performed at [facility], [address] [place], [place] [address]    Special Requests   Final    BOTTLES DRAWN AEROBIC AND ANAEROBIC Blood Culture results may not be optimal due to an inadequate volume of blood received in culture bottles Performed at [facility], [address] [place], [place] [address]    Culture   Final    NO GROWTH 5 DAYS Performed at [facility], [address], [place], [place] [address]    Report Status 04/16/2024 FINAL  Final     Labs: BNP (last 3 results) No results for input(s): BNP in the last 8760 hours. Basic Metabolic Panel: Recent Labs  Lab 04/11/24 0110 04/12/24 0444 04/13/24 0516 04/14/24 0514 04/17/24 0503  NA 138 134* 136 136 134*  K 3.5 3.4* 4.0 3.6 3.9  CL 103 99 102 102 99  CO2 26 26 24 25 24   GLUCOSE 195* 152* 145* 162* 212*  BUN <5* <5* <5* <5* 11  CREATININE 0.72 0.73 0.74 0.71 0.82  CALCIUM  9.1 8.7* 9.1 9.1 9.8  MG  --  1.4* 1.8 1.7  --   PHOS  --  2.6 2.4* 2.5  --    Liver Function Tests: Recent Labs  Lab 04/10/24 1548 04/11/24 0110 04/12/24 0444 04/13/24 0516 04/14/24 0514  AST 11* 11*  --   --   --   ALT <5 5  --   --   --   ALKPHOS 87 81  --   --   --   BILITOT 0.7 0.7  --   --   --   PROT 7.5 7.3  --   --   --   ALBUMIN 3.3* 3.0* 3.0* 3.0* 3.0*   Recent Labs  Lab 04/10/24 1548  LIPASE 28   No results for input(s): AMMONIA in the last 168 hours. CBC: Recent Labs  Lab 04/11/24 0110 04/12/24 0444 04/13/24 0516 04/14/24 0514 04/17/24 0503  WBC 7.3 6.2 5.4 5.7 7.7  NEUTROABS 5.3  --   --   --   --   HGB 15.0 13.9 14.5 14.5 14.8  HCT 47.2* 43.0 44.1 43.8  45.7  MCV 98.7 98.4 96.3  96.7 96.8  PLT 144* 141* 145* 160 172   Cardiac Enzymes: No results for input(s): CKTOTAL, CKMB, CKMBINDEX, TROPONINI in the last 168 hours. BNP: Invalid input(s): POCBNP CBG: Recent Labs  Lab 04/16/24 0755 04/16/24 1132 04/16/24 1627 04/16/24 2103 04/17/24 0746  GLUCAP 161* 186* 238* 238* 182*   D-Dimer No results for input(s): DDIMER in the last 72 hours. Hgb A1c No results for input(s): HGBA1C in the last 72 hours. Lipid Profile No results for input(s): CHOL, HDL, LDLCALC, TRIG, CHOLHDL, LDLDIRECT in the last 72 hours. Thyroid function studies No results for input(s): TSH, T4TOTAL, T3FREE, THYROIDAB in the last 72 hours.  Invalid input(s): FREET3 Anemia work up No results for input(s): VITAMINB12, FOLATE, FERRITIN, TIBC, IRON, RETICCTPCT in the last 72 hours. Urinalysis    Component Value Date/Time   COLORURINE STRAW (A) 03/22/2024 1234   APPEARANCEUR CLEAR 03/22/2024 1234   LABSPEC 1.006 03/22/2024 1234   PHURINE 5.0 03/22/2024 1234   GLUCOSEU NEGATIVE 03/22/2024 1234   HGBUR SMALL (A) 03/22/2024 1234   BILIRUBINUR NEGATIVE 03/22/2024 1234   KETONESUR NEGATIVE 03/22/2024 1234   PROTEINUR NEGATIVE 03/22/2024 1234   UROBILINOGEN 1.0 04/01/2015 0948   NITRITE NEGATIVE 03/22/2024 1234   LEUKOCYTESUR NEGATIVE 03/22/2024 1234   Sepsis Labs Recent Labs  Lab 04/12/24 0444 04/13/24 0516 04/14/24 0514 04/17/24 0503  WBC 6.2 5.4 5.7 7.7   Microbiology Recent Results (from the past 240 hours)  Culture, blood (routine x 2)     Status: None   Collection Time: 04/10/24  3:48 PM   Specimen: Right Antecubital; Blood  Result Value Ref Range Status   Specimen Description   Final    RIGHT ANTECUBITAL Performed at [facility], [address] [place], [place] [address]    Special Requests   Final    BOTTLES DRAWN AEROBIC AND ANAEROBIC Blood Culture adequate volume Performed at [facility], [address] [place], [place] [address]    Culture   Final    NO GROWTH 5 DAYS Performed at [facility], [address], [place], [place] [address]    Report Status 04/15/2024 FINAL  Final  Culture, blood (routine x 2)     Status: None   Collection Time: 04/11/24  1:10 AM   Specimen: BLOOD LEFT ARM  Result Value Ref Range Status   Specimen Description   Final    BLOOD LEFT ARM Performed at [facility], [address] [place], [place] [address]    Special Requests   Final    BOTTLES DRAWN AEROBIC AND ANAEROBIC Blood Culture results may not be optimal due to an inadequate volume of blood received in culture bottles Performed at [facility], [address] [place], [place] [address]    Culture   Final    NO GROWTH 5 DAYS Performed at [facility], [address] [place], [place] [address]    Report Status 04/16/2024 FINAL  Final    Please note: You were cared for by a hospitalist during your hospital stay. Once you are discharged, your primary care physician will handle any further medical issues. Please note that NO REFILLS for any discharge medications will be authorized once you are discharged, as it is imperative that you return to your primary care physician (or establish a relationship with a primary care physician if you do not have one) for your post hospital discharge  needs so that they can reassess your need for medications and monitor your lab values.    Time coordinating discharge: 40 minutes  SIGNED:   Ivonne Mustache, MD  [facility] 04/17/2024, 1:13 PM Pager [phone]  If 7PM-7AM, please contact night-coverage [email or web address] Password [email or web address]

## 2024-04-17 NOTE — Plan of Care (Signed)

## 2024-04-18 LAB — PROTEIN S ACTIVITY: Protein S Activity: 93 % (ref 63–140)

## 2024-04-18 LAB — BETA-2-GLYCOPROTEIN I ABS, IGG/M/A
Beta-2 Glyco I IgG: <9 GPI IgG units (ref 0–20)
Beta-2-Glycoprotein I IgA: <9 GPI IgA units (ref 0–25)
Beta-2-Glycoprotein I IgM: <9 GPI IgM units (ref 0–32)

## 2024-04-18 LAB — PROTEIN S, TOTAL: Protein S Ag, Total: 123 % (ref 60–150)

## 2024-04-18 LAB — PROTEIN C ACTIVITY: Protein C Activity: 84 % (ref 73–180)

## 2024-04-19 LAB — CARDIOLIPIN ANTIBODIES, IGG, IGM, IGA
Anticardiolipin IgA: 16 U/mL — ABNORMAL HIGH (ref 0–11)
Anticardiolipin IgG: 21 GPL U/mL — ABNORMAL HIGH (ref 0–14)
Anticardiolipin IgM: 34 [MPL'U]/mL — ABNORMAL HIGH (ref 0–12)

## 2024-04-20 LAB — PROTEIN C, TOTAL: Protein C, Total: 70 % (ref 60–150)

## 2024-04-22 LAB — FACTOR 5 LEIDEN

## 2024-04-23 ENCOUNTER — Ambulatory Visit: Payer: Self-pay | Admitting: Hematology & Oncology

## 2024-04-23 LAB — PROTHROMBIN GENE MUTATION

## 2024-04-23 LAB — LUPUS ANTICOAGULANT PANEL
DRVVT: >180 s — ABNORMAL HIGH (ref 0.0–47.0)
PTT Lupus Anticoagulant: 71 s — ABNORMAL HIGH (ref 0.0–43.5)

## 2024-04-23 LAB — PTT-LA MIX: PTT-LA Mix: 63.5 s — ABNORMAL HIGH (ref 0.0–40.5)

## 2024-04-23 LAB — DRVVT MIX: dRVVT Mix: 74.2 s — ABNORMAL HIGH (ref 0.0–40.4)

## 2024-04-23 LAB — HEXAGONAL PHASE PHOSPHOLIPID: Hexagonal Phase Phospholipid: 9 s (ref 0–11)

## 2024-04-23 LAB — DRVVT CONFIRM: dRVVT Confirm: >1.4 ratio — ABNORMAL HIGH (ref 0.8–1.2)

## 2024-04-24 ENCOUNTER — Emergency Department (HOSPITAL_COMMUNITY)

## 2024-04-24 ENCOUNTER — Other Ambulatory Visit: Payer: Self-pay

## 2024-04-24 ENCOUNTER — Inpatient Hospital Stay (HOSPITAL_COMMUNITY)
Admission: EM | Admit: 2024-04-24 | Discharge: 2024-05-06 | DRG: 329 | Disposition: A | Source: Ambulatory Visit | Attending: Internal Medicine | Admitting: Internal Medicine

## 2024-04-24 ENCOUNTER — Encounter (HOSPITAL_COMMUNITY): Payer: Self-pay

## 2024-04-24 DIAGNOSIS — K5720 Diverticulitis of large intestine with perforation and abscess without bleeding: Secondary | ICD-10-CM | POA: Diagnosis present

## 2024-04-24 DIAGNOSIS — R0902 Hypoxemia: Secondary | ICD-10-CM

## 2024-04-24 DIAGNOSIS — Z7951 Long term (current) use of inhaled steroids: Secondary | ICD-10-CM | POA: Diagnosis not present

## 2024-04-24 DIAGNOSIS — E66812 Obesity, class 2: Secondary | ICD-10-CM

## 2024-04-24 DIAGNOSIS — D6862 Lupus anticoagulant syndrome: Secondary | ICD-10-CM | POA: Diagnosis present

## 2024-04-24 DIAGNOSIS — E785 Hyperlipidemia, unspecified: Secondary | ICD-10-CM | POA: Diagnosis present

## 2024-04-24 DIAGNOSIS — Z6839 Body mass index (BMI) 39.0-39.9, adult: Secondary | ICD-10-CM | POA: Diagnosis not present

## 2024-04-24 DIAGNOSIS — Z7985 Long-term (current) use of injectable non-insulin antidiabetic drugs: Secondary | ICD-10-CM | POA: Diagnosis not present

## 2024-04-24 DIAGNOSIS — K5900 Constipation, unspecified: Secondary | ICD-10-CM | POA: Diagnosis present

## 2024-04-24 DIAGNOSIS — I5032 Chronic diastolic (congestive) heart failure: Secondary | ICD-10-CM | POA: Diagnosis present

## 2024-04-24 DIAGNOSIS — J4489 Other specified chronic obstructive pulmonary disease: Secondary | ICD-10-CM | POA: Diagnosis present

## 2024-04-24 DIAGNOSIS — K611 Rectal abscess: Secondary | ICD-10-CM | POA: Diagnosis present

## 2024-04-24 DIAGNOSIS — K5792 Diverticulitis of intestine, part unspecified, without perforation or abscess without bleeding: Principal | ICD-10-CM

## 2024-04-24 DIAGNOSIS — R109 Unspecified abdominal pain: Secondary | ICD-10-CM | POA: Diagnosis present

## 2024-04-24 DIAGNOSIS — I4891 Unspecified atrial fibrillation: Secondary | ICD-10-CM | POA: Diagnosis present

## 2024-04-24 DIAGNOSIS — Z7901 Long term (current) use of anticoagulants: Secondary | ICD-10-CM | POA: Diagnosis not present

## 2024-04-24 DIAGNOSIS — F39 Unspecified mood [affective] disorder: Secondary | ICD-10-CM | POA: Insufficient documentation

## 2024-04-24 DIAGNOSIS — Z933 Colostomy status: Principal | ICD-10-CM

## 2024-04-24 DIAGNOSIS — I110 Hypertensive heart disease with heart failure: Secondary | ICD-10-CM | POA: Diagnosis present

## 2024-04-24 DIAGNOSIS — R0789 Other chest pain: Secondary | ICD-10-CM | POA: Insufficient documentation

## 2024-04-24 DIAGNOSIS — Z86718 Personal history of other venous thrombosis and embolism: Secondary | ICD-10-CM | POA: Diagnosis not present

## 2024-04-24 DIAGNOSIS — R45851 Suicidal ideations: Secondary | ICD-10-CM | POA: Diagnosis present

## 2024-04-24 DIAGNOSIS — E119 Type 2 diabetes mellitus without complications: Secondary | ICD-10-CM

## 2024-04-24 DIAGNOSIS — F17210 Nicotine dependence, cigarettes, uncomplicated: Secondary | ICD-10-CM | POA: Diagnosis present

## 2024-04-24 DIAGNOSIS — F1210 Cannabis abuse, uncomplicated: Secondary | ICD-10-CM | POA: Diagnosis present

## 2024-04-24 DIAGNOSIS — G894 Chronic pain syndrome: Secondary | ICD-10-CM | POA: Diagnosis present

## 2024-04-24 DIAGNOSIS — I81 Portal vein thrombosis: Secondary | ICD-10-CM | POA: Diagnosis present

## 2024-04-24 DIAGNOSIS — J449 Chronic obstructive pulmonary disease, unspecified: Secondary | ICD-10-CM | POA: Insufficient documentation

## 2024-04-24 DIAGNOSIS — E1165 Type 2 diabetes mellitus with hyperglycemia: Secondary | ICD-10-CM | POA: Diagnosis present

## 2024-04-24 DIAGNOSIS — Z23 Encounter for immunization: Secondary | ICD-10-CM | POA: Diagnosis not present

## 2024-04-24 DIAGNOSIS — K55069 Acute infarction of intestine, part and extent unspecified: Secondary | ICD-10-CM | POA: Diagnosis not present

## 2024-04-24 DIAGNOSIS — I10 Essential (primary) hypertension: Secondary | ICD-10-CM | POA: Diagnosis present

## 2024-04-24 LAB — URINALYSIS, ROUTINE W REFLEX MICROSCOPIC
Bacteria, UA: NONE SEEN
Bilirubin Urine: NEGATIVE
Glucose, UA: NEGATIVE mg/dL
Hgb urine dipstick: NEGATIVE
Ketones, ur: NEGATIVE mg/dL
Nitrite: NEGATIVE
Protein, ur: >=300 mg/dL — AB
Specific Gravity, Urine: 1.02 (ref 1.005–1.030)
pH: 5 (ref 5.0–8.0)

## 2024-04-24 LAB — COMPREHENSIVE METABOLIC PANEL WITH GFR
ALT: 8 U/L (ref 0–44)
AST: 12 U/L — ABNORMAL LOW (ref 15–41)
Albumin: 3.1 g/dL — ABNORMAL LOW (ref 3.5–5.0)
Alkaline Phosphatase: 81 U/L (ref 38–126)
Anion gap: 10 (ref 5–15)
BUN: 7 mg/dL — ABNORMAL LOW (ref 8–23)
CO2: 25 mmol/L (ref 22–32)
Calcium: 9.4 mg/dL (ref 8.9–10.3)
Chloride: 101 mmol/L (ref 98–111)
Creatinine, Ser: 0.67 mg/dL (ref 0.44–1.00)
GFR, Estimated: >60 mL/min (ref >60)
Glucose, Bld: 169 mg/dL — ABNORMAL HIGH (ref 70–99)
Potassium: 3.6 mmol/L (ref 3.5–5.1)
Sodium: 136 mmol/L (ref 135–145)
Total Bilirubin: 0.4 mg/dL (ref 0.0–1.2)
Total Protein: 7.7 g/dL (ref 6.5–8.1)

## 2024-04-24 LAB — CBC
HCT: 45.2 % (ref 36.0–46.0)
Hemoglobin: 15.1 g/dL — ABNORMAL HIGH (ref 12.0–15.0)
MCH: 31.6 pg (ref 26.0–34.0)
MCHC: 33.4 g/dL (ref 30.0–36.0)
MCV: 94.6 fL (ref 80.0–100.0)
Platelets: 198 K/uL (ref 150–400)
RBC: 4.78 MIL/uL (ref 3.87–5.11)
RDW: 12.9 % (ref 11.5–15.5)
WBC: 10.5 K/uL (ref 4.0–10.5)
nRBC: 0 % (ref 0.0–0.2)

## 2024-04-24 LAB — LIPASE, BLOOD: Lipase: 18 U/L (ref 11–51)

## 2024-04-24 MED ORDER — OXYCODONE HCL 5 MG PO TABS
5.0000 mg | ORAL_TABLET | Freq: Four times a day (QID) | ORAL | Status: DC | PRN
  Administered 2024-04-25 – 2024-04-26 (×3): 5 mg via ORAL
  Filled 2024-04-24 (×3): qty 1

## 2024-04-24 MED ORDER — HYDROMORPHONE HCL 1 MG/ML IJ SOLN
0.5000 mg | Freq: Once | INTRAMUSCULAR | Status: AC
  Administered 2024-04-24: 0.5 mg via INTRAVENOUS
  Filled 2024-04-24: qty 1

## 2024-04-24 MED ORDER — PIPERACILLIN-TAZOBACTAM 3.375 G IVPB 30 MIN
3.3750 g | Freq: Once | INTRAVENOUS | Status: AC
  Administered 2024-04-24: 3.375 g via INTRAVENOUS
  Filled 2024-04-24: qty 50

## 2024-04-24 MED ORDER — BUDESONIDE 0.5 MG/2ML IN SUSP
0.5000 mg | Freq: Two times a day (BID) | RESPIRATORY_TRACT | Status: DC
  Administered 2024-04-25 – 2024-04-29 (×9): 0.5 mg via RESPIRATORY_TRACT
  Filled 2024-04-24 (×9): qty 2

## 2024-04-24 MED ORDER — HYDROMORPHONE HCL 1 MG/ML IJ SOLN
0.5000 mg | INTRAMUSCULAR | Status: DC | PRN
  Administered 2024-04-25 (×2): 0.5 mg via INTRAVENOUS
  Filled 2024-04-24 (×2): qty 1

## 2024-04-24 MED ORDER — IOHEXOL 300 MG/ML  SOLN
100.0000 mL | Freq: Once | INTRAMUSCULAR | Status: AC | PRN
  Administered 2024-04-24: 100 mL via INTRAVENOUS

## 2024-04-24 MED ORDER — NALOXONE HCL 0.4 MG/ML IJ SOLN: 0.4000 mg | INTRAMUSCULAR | Status: DC | PRN

## 2024-04-24 MED ORDER — PIPERACILLIN-TAZOBACTAM 3.375 G IVPB
3.3750 g | Freq: Three times a day (TID) | INTRAVENOUS | Status: AC
  Administered 2024-04-25 – 2024-05-01 (×19): 3.375 g via INTRAVENOUS
  Filled 2024-04-24 (×19): qty 50

## 2024-04-24 MED ORDER — ACETAMINOPHEN 325 MG PO TABS
650.0000 mg | ORAL_TABLET | Freq: Four times a day (QID) | ORAL | Status: DC | PRN
  Administered 2024-04-26: 650 mg via ORAL
  Filled 2024-04-24: qty 2

## 2024-04-24 MED ORDER — SODIUM CHLORIDE (PF) 0.9 % IJ SOLN
INTRAMUSCULAR | Status: AC
  Filled 2024-04-24: qty 50

## 2024-04-24 MED ORDER — ARFORMOTEROL TARTRATE 15 MCG/2ML IN NEBU
15.0000 ug | INHALATION_SOLUTION | Freq: Two times a day (BID) | RESPIRATORY_TRACT | Status: DC
  Administered 2024-04-25 – 2024-04-29 (×9): 15 ug via RESPIRATORY_TRACT
  Filled 2024-04-24 (×9): qty 2

## 2024-04-24 MED ORDER — HYDRALAZINE HCL 25 MG PO TABS
25.0000 mg | ORAL_TABLET | Freq: Three times a day (TID) | ORAL | Status: DC
  Administered 2024-04-25 – 2024-05-06 (×34): 25 mg via ORAL
  Filled 2024-04-24 (×34): qty 1

## 2024-04-24 MED ORDER — ALBUTEROL SULFATE HFA 108 (90 BASE) MCG/ACT IN AERS: 2.0000 | INHALATION_SPRAY | Freq: Four times a day (QID) | RESPIRATORY_TRACT | Status: DC | PRN

## 2024-04-24 MED ORDER — INSULIN ASPART 100 UNIT/ML IJ SOLN
0.0000 [IU] | INTRAMUSCULAR | Status: DC
  Administered 2024-04-25 (×4): 2 [IU] via SUBCUTANEOUS
  Administered 2024-04-25: 1 [IU] via SUBCUTANEOUS
  Administered 2024-04-25: 2 [IU] via SUBCUTANEOUS
  Administered 2024-04-26: 3 [IU] via SUBCUTANEOUS
  Administered 2024-04-26: 2 [IU] via SUBCUTANEOUS
  Administered 2024-04-26 (×2): 3 [IU] via SUBCUTANEOUS
  Administered 2024-04-26 – 2024-04-27 (×2): 2 [IU] via SUBCUTANEOUS
  Administered 2024-04-27 (×2): 3 [IU] via SUBCUTANEOUS
  Administered 2024-04-27 (×2): 5 [IU] via SUBCUTANEOUS
  Administered 2024-04-28 (×2): 2 [IU] via SUBCUTANEOUS
  Administered 2024-04-28: 3 [IU] via SUBCUTANEOUS
  Administered 2024-04-28: 2 [IU] via SUBCUTANEOUS
  Filled 2024-04-24: qty 3
  Filled 2024-04-24 (×2): qty 2
  Filled 2024-04-24 (×3): qty 3
  Filled 2024-04-24: qty 2
  Filled 2024-04-24: qty 3
  Filled 2024-04-24 (×2): qty 2
  Filled 2024-04-24: qty 1
  Filled 2024-04-24 (×3): qty 2
  Filled 2024-04-24: qty 5
  Filled 2024-04-24 (×2): qty 2
  Filled 2024-04-24: qty 3
  Filled 2024-04-24: qty 2
  Filled 2024-04-24: qty 5

## 2024-04-24 MED ORDER — SODIUM CHLORIDE 0.9 % IV SOLN: INTRAVENOUS | Status: AC

## 2024-04-24 MED ORDER — METOPROLOL SUCCINATE ER 50 MG PO TB24
100.0000 mg | ORAL_TABLET | Freq: Every day | ORAL | Status: DC
  Administered 2024-04-25 – 2024-05-06 (×12): 100 mg via ORAL
  Filled 2024-04-24 (×12): qty 1
  Filled 2024-04-24: qty 2

## 2024-04-24 MED ORDER — ONDANSETRON 4 MG PO TBDP
4.0000 mg | ORAL_TABLET | Freq: Three times a day (TID) | ORAL | Status: DC | PRN
  Administered 2024-04-25 – 2024-05-04 (×7): 4 mg via ORAL
  Filled 2024-04-24 (×7): qty 1

## 2024-04-24 MED ORDER — ALBUTEROL SULFATE (2.5 MG/3ML) 0.083% IN NEBU: 2.5000 mg | INHALATION_SOLUTION | Freq: Four times a day (QID) | RESPIRATORY_TRACT | Status: DC | PRN

## 2024-04-24 MED ORDER — REVEFENACIN 175 MCG/3ML IN SOLN
175.0000 ug | Freq: Every day | RESPIRATORY_TRACT | Status: DC
  Administered 2024-04-25 – 2024-04-29 (×5): 175 ug via RESPIRATORY_TRACT
  Filled 2024-04-24 (×5): qty 3

## 2024-04-24 MED ORDER — ONDANSETRON HCL 4 MG/2ML IJ SOLN
4.0000 mg | Freq: Once | INTRAMUSCULAR | Status: AC
  Administered 2024-04-24: 4 mg via INTRAVENOUS
  Filled 2024-04-24: qty 2

## 2024-04-24 MED ORDER — ACETAMINOPHEN 650 MG RE SUPP: 650.0000 mg | Freq: Four times a day (QID) | RECTAL | Status: DC | PRN

## 2024-04-24 NOTE — Progress Notes (Signed)
 [facility]    Date: 04/24/2024 Patient name: Lisa Ibarra Date of birth: 01/15/1961   ASSESSMENT/PLAN:  Diagnoses and all orders for this visit: Diverticulitis of large intestine with complication Lower abdominal pain Portal vein thrombosis  At this time, patient is complaining out of 10 out of 10 persistent pain and very tearful.  She does have a very complicated social situation going on with the loss of her child and trying to get unemployment.  I have given her a note today that I am recommending she go back to the hospital and cannot return to work this week.  I have called the surgeons office and asked them if they could see her emergently outpatient to try to avoid going back to the hospital.  I talked to triage nurse Nat who spoke with dr Debby' nurse.  Their recommendation is for her to go back to the emergency room as this cannot be treated on an outpatient basis.  I have concerns that her pain remains persistent and is complicated with microperforation and possible abscess formation last CT scan.  I have discussed that long-term continuation of oxycodone  and antibiotics and refilling these until she sees the surgeon in February is not the answer.  I have suspicion that she is not improving and needs another surgical consult while in the hospital.  However, they want me to guarantee that if they go back to the hospital that they we will be able to get the surgery today.  I have let them know that I cannot guarantee that and that is at the surgeon's discretion / recommendation but that something is not improving if she is in 10 out of 10 pain and my recommendation still stands that she go back to the hospital.  I have agreed to do the daughter's FMLA paperwork as she is requesting it for transporting her mother who currently can't drive and taking care of her / hospital visits.  She went to the car to get it after the end of the appointment and  brought it back to the office while I was on a call but said she did not want to leave it. She wanted it filled right then and ended up taking it back with her.  Said she did not want to leave it. I am willing to fill it out but they will need to leave the FMLA forms with me to fill out for her.  Documentation for time-based billing:  Total time spent of date of service was 60 minutes.  Patient care activities included preparing to see the patient such as reviewing the patient record, obtaining and/or reviewing separately obtained history, performing a medically appropriate history and physical examination, counseling and educating the patient, family, and/or caregiver, ordering prescription medications, tests, or procedures, referring and communicating with other health care providers when not separately reported during the visit, and documenting clinical information in the electronic or other health record.    Patient expresses understanding of their current medications and use.  We discussed potential side effects, drug interactions, instructions for taking the medication, and the consequences of not taking it. Patient verbalized an understanding of these instructions. Patient is able to verbalize understanding of the care plan discussed today. Patient's medical and personal goals were discussed today.  Computer technology was used to create visit note. Consent from patient/care giver was obtained prior to its use.   Patient was instructed to return sooner if no improvement, and we discussed precautions  to seek emergency medical care if condition worsens.    SUBJECTIVE:  Ms. Truss is a 63 y.o. female that presents to clinic today regarding the following issues: TCM (Post hosp admission f/u)   History of Present Illness The patient presents for a hospital follow-up. She has had a complicated course with two recent hospitalizations. Initially, she was hospitalized from 03/25/2024 through  03/29/2024 due to bacteremia, with blood cultures growing Pseudomonas, Klebsiella, and E. coli. Repeat cultures remained negative. She was treated with Zosyn , with the suspected source being diverticulitis. During that time, she also experienced acute hypoxic respiratory failure in the setting of COPD and asthma, which resolved. She was recommended to follow up with GI for an outpatient colonoscopy.  She continued to experience worsening abdominal pain and was readmitted to the hospital on 04/10/2024, where she was discharged on 04/17/2024. A CT scan showed acute complicated sigmoid diverticulitis with microperforation and possible abscess. She was started on IV antibiotics, and general surgery was consulted. A follow-up CT of the abdomen and pelvis on 04/14/2024 showed portal vein thrombosis, necessitating a hematology consult. She was started on Eliquis , and a hypercoagulable panel was done. No surgery was performed at that time as there was no identifiable abscess for drainage. She was recommended to follow up with general surgery and hematology on an outpatient basis. Blood cultures remained negative during this hospitalization. She has a follow-up with the hematologist on 05/14/2024.  She is here today with her daughter. Since her discharge, she reports feeling unwell and still having 10/10 abdominal pain. She is having to take oxycodone  to help with the pain and if she doesn't take it, the pain is unbearable. She also thinks she is supposed to continue long term antibiotics and needs a refill but she doesn't know when her appointment with the surgeon is. She said that she thought it was this month but her discharge paperwork says it in February.   She said they were talking about doing a partial removal of her colon and placing an ostomy but were trying to avoid that. She said it is sharp, stabbing pain across her lower abdomen. She is in unbearable pain currently because she hasn't taken an oxy since  last night.   She is not having any fevers. She also is in distress because her job with the [employer] is eliminating her job and 4 others. 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  She is very tearful and doesn't feel she is capable of going back to work in the pain and condition she is in. She needs a note for work that she can't go back today.   Additionally, she mentions a blood clot found in her liver and is on eliquis . Has an appointment with hematology 12/23. She is very frustrated about the persistent pain and doesn't know what she is supposed to do. Asking for a refill of her antibiotics and oxycodone .         Reviewed and updated this visit by provider: Tobacco  Allergies  Meds  Problems  Med Hx  Surg Hx  Fam Hx        Medications Patient's Medications  New Prescriptions   No medications on file  Previous Medications   ALBUTEROL  SULFATE HFA (PROVENTIL ,VENTOLIN ,PROAIR ) 108 (90 BASE) MCG/ACT INHALER    INHALE 2 PUFFS BY MOUTH EVERY 4 HOURS AS NEEDED  FOR WHEEZING   APIXABAN  (ELIQUIS ) 5 MG TABLET    Take one tablet (5 mg dose) by mouth.   ARFORMOTEROL  (BROVANA ) 15 MCG/2ML NEBU    Inhale fifteen mcg into the lungs.   ASPIRIN-ACETAMINOPHEN -CAFFEINE  (GOODYS EXTRA STRENGTH) 500-325-65 MG PACK    Take 1 packet by mouth see administration instructions.   ATORVASTATIN  (LIPITOR) 20 MG TABLET    Take one tablet (20 mg dose) by mouth daily.   BUDESONIDE  (PULMICORT ) 0.5 MG/2 ML NEBULIZER SOLUTION    Inhale 2 mLs (0.5 mg dose) into the lungs.   FUROSEMIDE  (LASIX ) 20 MG TABLET    Take one tablet (20 mg dose) by mouth daily as needed. Max dose 40mg  daily.   HYDRALAZINE  HCL (APRESOLINE ) 25 MG TABLET    Take one tablet (25 mg dose) by mouth every 8 (eight) hours.   LOSARTAN  POTASSIUM (COZAAR ) 100 MG TABLET    Take 1 tablet by mouth once daily   METOPROLOL  SUCCINATE (TOPROL -XL) 100  MG 24 HR TABLET    Take one tablet (100 mg dose) by mouth daily.   METRONIDAZOLE  (FLAGYL ) 500 MG TABLET    Take one tablet (500 mg dose) by mouth 2 (two) times daily.   OXYCODONE  HCL (ROXICODONE ) 5 MG IMMEDIATE RELEASE TABLET    Take one tablet (5 mg dose) by mouth every 6 (six) hours as needed.   POLYETHYLENE GLYCOL (MIRALAX ) 17 G PACKET    Take 120 mLs (17 g dose) by mouth daily.   POTASSIUM CHLORIDE  (K-DUR,KLOR-CON ) 20 MEQ CR TABLET    Take one tablet (20 mEq dose) by mouth daily.   REVEFENACIN  (YUPELRI ) 175 MCG/3ML SOLN    Inhale 175 mcg into the lungs daily.   SEMAGLUTIDE,0.25 OR 0.5MG /DOS, (OZEMPIC, 0.25 OR 0.5 MG/DOSE,) 2 MG/3ML SOPN    INJECT 0.2 MLS (0.25 MG TOTAL) INTO THE SKIN ONCE A WEEK.   SENNOSIDES-DOCUSATE SODIUM  (SENNA-DOCUSATE) 8.6-50 MG PER TABLET    Take one tablet by mouth.  Modified Medications   No medications on file  Discontinued Medications   AMLODIPINE  BESYLATE (NORVASC ) 5 MG TABLET    Take 1 tablet by mouth once daily   METOPROLOL  SUCCINATE (TOPROL -XL) 50 MG 24 HR TABLET    Take 1 tablet by mouth once daily      ROS  Review of Systems  Constitutional: Negative.   HENT: Negative.    Eyes: Negative.   Respiratory: Negative.    Cardiovascular: Negative.   Gastrointestinal:  Positive for abdominal pain.  Endocrine: Negative.   Genitourinary: Negative.   Musculoskeletal: Negative.   Skin: Negative.   Allergic/Immunologic: Negative.   Neurological: Negative.   Hematological: Negative.   Psychiatric/Behavioral: Negative.       Review of Systems - All other systems reviewed are negative except as noted above.   OBJECTIVE:  Vitals BP (!) 162/94 (Patient Position: Sitting)   Pulse 86   Temp 97.7 F (36.5 C) (Temporal)   Ht 5' 9 (1.753 m)   Wt 270 lb 12.8 oz (122.8 kg)   SpO2 98%   BMI 39.99 kg/m    PHYSICAL:  General Appearance: Alert, oriented. Tearful.  Head: Normocephalic, without obvious abnormality, atraumatic.  HENT: Oral mucosa moist   Respiratory: Good air movement throughout. No accessory muscle use, increased work of breathing or respiratory distress. Clear to auscultation in all lung fields. Normal effort of breathing, non-labored. No adventitious lung sounds including wheezes, rhonchi or crackles. Cardiovascular: Regular rate and rhythm, S1, S2 normal, no murmur. No heaves, lifts or  thrills palpable. Gastrointestinal: Soft, very tender mid and left lower abdomen. No rebound or guarding.   Extremities: No peripheral edema.  Skin: Warm, dry.  Psychiatric: Pleasant. Very tearful and upset.  Neurological: AOx3. In wheelchair     Joesph HILARIO Cedar, PA-C  [facility] [place]  04/24/2024 10:13 AM

## 2024-04-24 NOTE — ED Triage Notes (Signed)
 Patient has diverticulitis that damaged her colon/intestines. Has had on and off admissions. Was sent home with antibiotics and oxycodone . Ran out of medications. Her doctor said she needs surgery if the antibiotics don't help. No vomiting. Has diarrhea. Has generalized abdominal pain.

## 2024-04-24 NOTE — Telephone Encounter (Signed)
 Advised via MyChart.

## 2024-04-24 NOTE — Progress Notes (Signed)
 ED Pharmacy Antibiotic Sign Off An antibiotic consult was received from an ED provider for Zosyn  per pharmacy dosing for [facility]. A chart review was completed to assess appropriateness.   The following one time order(s) were placed:  Zosyn  3.375g IV  Further antibiotic and/or antibiotic pharmacy consults should be ordered by the admitting provider if indicated.   Thank you for allowing pharmacy to be a part of this patient's care.   Eleanor Agent, [facility]  Clinical Pharmacist 04/24/24 8:53 PM

## 2024-04-24 NOTE — Consult Note (Signed)
 CC: abd pain  Requesting provider: Dr Ula  HPI: Lisa Ibarra is an 63 y.o. female who is here for abdominal pain.  Pt recently admitted with diverticulitis and discharged on antibiotics after IR aspiration of a small fluid collection.  Her symptoms returned after completing this course of antibiotics.  She has a h/o a fib and is on Elliquis.  Past Medical History:  Diagnosis Date   Asthma    Cancer (HCC)    Cannabis abuse    Chronic pain    Diabetes mellitus type 2, controlled (HCC)    Hypertension     Past Surgical History:  Procedure Laterality Date   CESAREAN SECTION     JOINT REPLACEMENT     Left eye     TUBAL LIGATION     TUMOR REMOVAL from back      Family History  Problem Relation Age of Onset   Stroke Mother    Lung cancer Father    Diabetes Mellitus II Sister     Social:  reports that she has been smoking cigarettes. She has never used smokeless tobacco. She reports current drug use. Drug: Marijuana. She reports that she does not drink alcohol.  Allergies:  Allergies  Allergen Reactions   Bee Venom Anaphylaxis and Hives   Empagliflozin Itching, Rash and Dermatitis   Ibuprofen Nausea And Vomiting   Statins Nausea Only and Other (See Comments)    Muscle spasms and cramps, too    Medications: I have reviewed the patient's current medications.  Results for orders placed or performed during the hospital encounter of 04/24/24 (from the past 48 hours)  Lipase, blood     Status: None   Collection Time: 04/24/24  2:44 PM  Result Value Ref Range   Lipase 18 11 - 51 U/L    Comment: Performed at [facility], [address], [place], [place] [address]  Comprehensive metabolic panel     Status: Abnormal   Collection Time: 04/24/24  2:44 PM  Result Value Ref Range   Sodium 136 135 - 145 mmol/L   Potassium 3.6 3.5 - 5.1 mmol/L   Chloride 101 98 - 111 mmol/L   CO2 25 22 - 32 mmol/L   Glucose, Bld 169 (H) 70 - 99 mg/dL    Comment: Glucose  reference range applies only to samples taken after fasting for at least 8 hours.   BUN 7 (L) 8 - 23 mg/dL   Creatinine, Ser 9.32 0.44 - 1.00 mg/dL   Calcium  9.4 8.9 - 10.3 mg/dL   Total Protein 7.7 6.5 - 8.1 g/dL   Albumin 3.1 (L) 3.5 - 5.0 g/dL   AST 12 (L) 15 - 41 U/L   ALT 8 0 - 44 U/L   Alkaline Phosphatase 81 38 - 126 U/L   Total Bilirubin 0.4 0.0 - 1.2 mg/dL   GFR, Estimated >39 >39 mL/min    Comment: (NOTE) Calculated using the CKD-EPI Creatinine Equation (2021)    Anion gap 10 5 - 15    Comment: Performed at [facility], [address], [place], [place] [address]  CBC     Status: Abnormal   Collection Time: 04/24/24  2:44 PM  Result Value Ref Range   WBC 10.5 4.0 - 10.5 K/uL   RBC 4.78 3.87 - 5.11 MIL/uL   Hemoglobin 15.1 (H) 12.0 - 15.0 g/dL   HCT 54.7 63.9 - 53.9 %   MCV 94.6 80.0 - 100.0 fL  MCH 31.6 26.0 - 34.0 pg   MCHC 33.4 30.0 - 36.0 g/dL   RDW 87.0 88.4 - 84.4 %   Platelets 198 150 - 400 K/uL   nRBC 0.0 0.0 - 0.2 %    Comment: Performed at [facility], [address], [place], [place] [address]  Urinalysis, Routine w reflex microscopic -Urine, Clean Catch     Status: Abnormal   Collection Time: 04/24/24  6:14 PM  Result Value Ref Range   Color, Urine YELLOW YELLOW   APPearance HAZY (A) CLEAR   Specific Gravity, Urine 1.020 1.005 - 1.030   pH 5.0 5.0 - 8.0   Glucose, UA NEGATIVE NEGATIVE mg/dL   Hgb urine dipstick NEGATIVE NEGATIVE   Bilirubin Urine NEGATIVE NEGATIVE   Ketones, ur NEGATIVE NEGATIVE mg/dL   Protein, ur >=699 (A) NEGATIVE mg/dL   Nitrite NEGATIVE NEGATIVE   Leukocytes,Ua TRACE (A) NEGATIVE   RBC / HPF 0-5 0 - 5 RBC/hpf   WBC, UA 0-5 0 - 5 WBC/hpf   Bacteria, UA NONE SEEN NONE SEEN   Squamous Epithelial / HPF 0-5 0 - 5 /HPF   Mucus PRESENT    Hyaline Casts, UA PRESENT     Comment: Performed at [facility], [address], [place], [place] [address]    CT ABDOMEN PELVIS W  CONTRAST Result Date: 04/24/2024 EXAM: CT ABDOMEN AND PELVIS WITH CONTRAST 04/24/2024 07:02:00 PM TECHNIQUE: CT of the abdomen and pelvis was performed with the administration of 100 mL iohexol  (OMNIPAQUE ) 300 MG/ML solution. Multiplanar reformatted images are provided for review. Automated exposure control, iterative reconstruction, and/or weight-based adjustment of the mA/kV was utilized to reduce the radiation dose to as low as reasonably achievable. COMPARISON: Chest CT examination of 08/18/2021. CLINICAL HISTORY: Abdominal pain, acute, nonlocalized; Recent diverticular abscess, worsening sx. FINDINGS: LOWER CHEST: No acute abnormality. LIVER: The liver is unremarkable. 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 The vessel remains patent. GALLBLADDER AND BILE DUCTS: Gallbladder is unremarkable. No biliary ductal dilatation. SPLEEN: No acute abnormality. PANCREAS: No acute abnormality. ADRENAL GLANDS: 13 mm right adrenal nodule is indeterminate on the current examination, but was better assessed on chest CT examination of 08/18/2021 where this was compatible with a benign adrenal adenoma. This is unchanged in size and further follow-up is not required. The left adrenal gland is unremarkable. KIDNEYS, URETERS AND BLADDER: Kidneys are normal in size and position. Multiple punctate nonobstructing calculi are seen within the left kidney with a dominant 9 mm calculus within the lower pole of the left kidney. No hydronephrosis. No ureteral calculi. A subcortical cyst is seen within the right kidney, for which no follow-up imaging is recommended. The bladder is partially obscured by streak artifact; however, the visualized portion is unremarkable. GI AND BOWEL: Severe sigmoid diverticulosis. Changes of perforated diverticulitis again noted. Extensive inflammatory tissue within the distal sigmoid mesentery with extraluminal gas is  again identified and has progressed in terms of density and overall size with the phlegmonous collection now measuring at least 5.4 x 7.8 x 5.5 cm in overall size. Extension of extraluminal gas into the inferior mesenteric vein peripherally again noted in keeping with changes of septic thrombophlebitis. No organized, drainable fluid collection identified. The inflammatory process encases the inferior mesenteric artery distally (5 1/2), however, no active extravasation is identified. Circumferential wall thickening involving the adjacent distal sigmoid colon is again noted, however, there is no evidence of  obstruction. The stomach, small bowel, and large bowel are otherwise unremarkable. The appendix is normal. PERITONEUM AND RETROPERITONEUM: No gross free intraperitoneal gas or fluid. VASCULATURE: Aorta is normal in caliber. Mild aortoiliac atherosclerotic calcification. No aortic aneurysm. LYMPH NODES: No lymphadenopathy. REPRODUCTIVE ORGANS: Streak artifact limits evaluation of the pelvis; however, changes of hysterectomy are suspected. No definite adnexal mass. BONES AND SOFT TISSUES: Status post left total hip arthroplasty. Advanced degenerative changes are seen within the lumbar spine. Remote L1 superior endplate fracture. Degenerative ankylosis L5-S1. Small fat-containing umbilical hernia. No acute bone abnormality. No focal soft tissue abnormality. IMPRESSION: 1. Severe sigmoid diverticulosis with perforated diverticulitis demonstrating progressive phlegmon in the distal sigmoid mesentery and extraluminal gas with septic thrombophlebitis involving peripheral branches of the inferior mesenteric vein. No organized drainable fluid collection. No bowel obstruction. 2. 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 is not as well visualized on the current examination Electronically signed by: Dorethia Molt MD 04/24/2024 08:29 PM EST RP Workstation: [id number]    ROS - all of the  below systems have been reviewed with the patient and positives are indicated with bold text General: chills, fever or night sweats Eyes: blurry vision or double vision ENT: epistaxis or sore throat Hematologic/Lymphatic: bleeding problems, blood clots or swollen lymph nodes Endocrine: temperature intolerance or unexpected weight changes Breast: new or changing breast lumps or nipple discharge Resp: cough, shortness of breath, or wheezing CV: chest pain or dyspnea on exertion GI: as per HPI GU: dysuria, trouble voiding, or hematuria Neuro: TIA or stroke symptoms    PE Blood pressure (!) 156/81, pulse 93, temperature 98.7 F (37.1 C), temperature source Oral, resp. rate 18, height 5' 9 (1.753 m), weight 122.5 kg, last menstrual period 06/12/2011, SpO2 92%. Constitutional: NAD; conversant; no deformities Eyes: Moist conjunctiva; no lid lag; anicteric; PERRL Neck: Trachea midline; no thyromegaly Lungs: Normal respiratory effort CV: RRR GI: Abd *** MSK: Normal range of motion of extremities; no clubbing/cyanosis Psychiatric: Appropriate affect; alert and oriented x3  Results for orders placed or performed during the hospital encounter of 04/24/24 (from the past 48 hours)  Lipase, blood     Status: None   Collection Time: 04/24/24  2:44 PM  Result Value Ref Range   Lipase 18 11 - 51 U/L    Comment: Performed at [facility], [address] [place], [place] [address]  Comprehensive metabolic panel     Status: Abnormal   Collection Time: 04/24/24  2:44 PM  Result Value Ref Range   Sodium 136 135 - 145 mmol/L   Potassium 3.6 3.5 - 5.1 mmol/L   Chloride 101 98 - 111 mmol/L   CO2 25 22 - 32 mmol/L   Glucose, Bld 169 (H) 70 - 99 mg/dL    Comment: Glucose reference range applies only to samples taken after fasting for at least 8 hours.   BUN 7 (L) 8 - 23 mg/dL   Creatinine, Ser 9.32 0.44 - 1.00 mg/dL   Calcium  9.4 8.9 - 10.3 mg/dL   Total Protein 7.7 6.5 - 8.1  g/dL   Albumin 3.1 (L) 3.5 - 5.0 g/dL   AST 12 (L) 15 - 41 U/L   ALT 8 0 - 44 U/L   Alkaline Phosphatase 81 38 - 126 U/L   Total Bilirubin 0.4 0.0 - 1.2 mg/dL   GFR, Estimated >39 >39 mL/min    Comment: (NOTE) Calculated using the CKD-EPI Creatinine Equation (2021)    Anion gap 10 5 -  15    Comment: Performed at [facility], [address] [place], [place] [address]  CBC     Status: Abnormal   Collection Time: 04/24/24  2:44 PM  Result Value Ref Range   WBC 10.5 4.0 - 10.5 K/uL   RBC 4.78 3.87 - 5.11 MIL/uL   Hemoglobin 15.1 (H) 12.0 - 15.0 g/dL   HCT 54.7 63.9 - 53.9 %   MCV 94.6 80.0 - 100.0 fL   MCH 31.6 26.0 - 34.0 pg   MCHC 33.4 30.0 - 36.0 g/dL   RDW 87.0 88.4 - 84.4 %   Platelets 198 150 - 400 K/uL   nRBC 0.0 0.0 - 0.2 %    Comment: Performed at [facility], [address] [place], [place] [address]  Urinalysis, Routine w reflex microscopic -Urine, Clean Catch     Status: Abnormal   Collection Time: 04/24/24  6:14 PM  Result Value Ref Range   Color, Urine YELLOW YELLOW   APPearance HAZY (A) CLEAR   Specific Gravity, Urine 1.020 1.005 - 1.030   pH 5.0 5.0 - 8.0   Glucose, UA NEGATIVE NEGATIVE mg/dL   Hgb urine dipstick NEGATIVE NEGATIVE   Bilirubin Urine NEGATIVE NEGATIVE   Ketones, ur NEGATIVE NEGATIVE mg/dL   Protein, ur >=699 (A) NEGATIVE mg/dL   Nitrite NEGATIVE NEGATIVE   Leukocytes,Ua TRACE (A) NEGATIVE   RBC / HPF 0-5 0 - 5 RBC/hpf   WBC, UA 0-5 0 - 5 WBC/hpf   Bacteria, UA NONE SEEN NONE SEEN   Squamous Epithelial / HPF 0-5 0 - 5 /HPF   Mucus PRESENT    Hyaline Casts, UA PRESENT     Comment: Performed at [facility], [address], [place], [place] [address]    CT ABDOMEN PELVIS W CONTRAST Result Date: 04/24/2024 EXAM: CT ABDOMEN AND PELVIS WITH CONTRAST 04/24/2024 07:02:00 PM TECHNIQUE: CT of the abdomen and pelvis was performed with the administration of 100 mL iohexol  (OMNIPAQUE ) 300  MG/ML solution. Multiplanar reformatted images are provided for review. Automated exposure control, iterative reconstruction, and/or weight-based adjustment of the mA/kV was utilized to reduce the radiation dose to as low as reasonably achievable. COMPARISON: Chest CT examination of 08/18/2021. CLINICAL HISTORY: Abdominal pain, acute, nonlocalized; Recent diverticular abscess, worsening sx. FINDINGS: LOWER CHEST: No acute abnormality. LIVER: The liver is unremarkable. 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 The vessel remains patent. GALLBLADDER AND BILE DUCTS: Gallbladder is unremarkable. No biliary ductal dilatation. SPLEEN: No acute abnormality. PANCREAS: No acute abnormality. ADRENAL GLANDS: 13 mm right adrenal nodule is indeterminate on the current examination, but was better assessed on chest CT examination of 08/18/2021 where this was compatible with a benign adrenal adenoma. This is unchanged in size and further follow-up is not required. The left adrenal gland is unremarkable. KIDNEYS, URETERS AND BLADDER: Kidneys are normal in size and position. Multiple punctate nonobstructing calculi are seen within the left kidney with a dominant 9 mm calculus within the lower pole of the left kidney. No hydronephrosis. No ureteral calculi. A subcortical cyst is seen within the right kidney, for which no follow-up imaging is recommended. The bladder is partially obscured by streak artifact; however, the visualized portion is unremarkable. GI AND BOWEL: Severe sigmoid diverticulosis. Changes of perforated diverticulitis again noted. Extensive inflammatory tissue within the distal sigmoid mesentery with extraluminal gas is again identified and has progressed in terms of  density and overall size with the phlegmonous collection now measuring at least 5.4 x 7.8 x 5.5 cm in overall size. Extension of extraluminal gas into the inferior  mesenteric vein peripherally again noted in keeping with changes of septic thrombophlebitis. No organized, drainable fluid collection identified. The inflammatory process encases the inferior mesenteric artery distally (5 1/2), however, no active extravasation is identified. Circumferential wall thickening involving the adjacent distal sigmoid colon is again noted, however, there is no evidence of obstruction. The stomach, small bowel, and large bowel are otherwise unremarkable. The appendix is normal. PERITONEUM AND RETROPERITONEUM: No gross free intraperitoneal gas or fluid. VASCULATURE: Aorta is normal in caliber. Mild aortoiliac atherosclerotic calcification. No aortic aneurysm. LYMPH NODES: No lymphadenopathy. REPRODUCTIVE ORGANS: Streak artifact limits evaluation of the pelvis; however, changes of hysterectomy are suspected. No definite adnexal mass. BONES AND SOFT TISSUES: Status post left total hip arthroplasty. Advanced degenerative changes are seen within the lumbar spine. Remote L1 superior endplate fracture. Degenerative ankylosis L5-S1. Small fat-containing umbilical hernia. No acute bone abnormality. No focal soft tissue abnormality. IMPRESSION: 1. Severe sigmoid diverticulosis with perforated diverticulitis demonstrating progressive phlegmon in the distal sigmoid mesentery and extraluminal gas with septic thrombophlebitis involving peripheral branches of the inferior mesenteric vein. No organized drainable fluid collection. No bowel obstruction. 2. 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 female with recurrent/non-resolving diverticulitis.  She will be admitted and placed on IV antibiotics.  Will need to consider surgical resection, given this is not resolving.  Cont NPO.  Hold  Elliquis.    Bernarda JAYSON Ned, MD  Colorectal and General Surgery [facility]   high medical decision making.

## 2024-04-24 NOTE — ED Notes (Signed)
 Patients O2 was reading 85, patient was placed on 3L [place] with improvement to 96.

## 2024-04-24 NOTE — ED Provider Notes (Signed)
 [place] EMERGENCY DEPARTMENT AT [facility] Provider Note   CSN: [id number] Arrival date & time: 04/24/24  1357     Patient presents with: Abdominal Pain   Lisa Ibarra is a 63 y.o. female.   11 female with past medical history of atrial fibrillation on Eliquis , hyperlipidemia, and diverticulitis with an abscess presenting to the emergency department today with abdominal pain.  Patient ran out of antibiotics recently.  Went to follow-up with her primary care provider and was referred back to the emergency department due to ongoing symptoms.  Has been afebrile but states she has been having persistent pain.  She ran out of pain medications as well.  Her primary care doctor did not refill these medications instead sent her to the ER for reevaluation.   Abdominal Pain      Prior to Admission medications   Medication Sig Start Date End Date Taking? Authorizing Provider  albuterol  (VENTOLIN  HFA) 108 (90 Base) MCG/ACT inhaler Inhale 2 puffs into the lungs every 6 (six) hours as needed for wheezing or shortness of breath. 08/22/21   Jerri Keys, MD  apixaban  (ELIQUIS ) 5 MG TABS tablet Take 2 tablets (10 mg total) by mouth 2 (two) times daily for 5 days, THEN 1 tablet (5 mg total) 2 (two) times daily. 04/17/24 05/22/24  Jillian Buttery, MD  arformoterol  (BROVANA ) 15 MCG/2ML NEBU Take 2 mLs (15 mcg total) by nebulization 2 (two) times daily. Patient not taking: Reported on 03/27/2024 03/24/24   Lue Elsie BROCKS, MD  Aspirin-Acetaminophen -Caffeine  (GOODYS EXTRA STRENGTH) 500-325-65 MG PACK Take 1 packet by mouth See admin instructions. Dissolve 1 packet in the mouth up to six times a day as needed for pain    [provider]  atorvastatin  (LIPITOR) 20 MG tablet Take 20 mg by mouth daily.    [provider]  blood glucose meter kit and supplies KIT Dispense based on patient and insurance preference. Use up to four times daily as directed. 08/22/21   Jerri Keys, MD   budesonide  (PULMICORT ) 0.5 MG/2ML nebulizer solution Take 2 mLs (0.5 mg total) by nebulization 2 (two) times daily. Patient not taking: Reported on 03/27/2024 03/24/24   Lue Elsie BROCKS, MD  furosemide  (LASIX ) 20 MG tablet Take 20-40 mg by mouth daily as needed for fluid or edema.    [provider]  hydrALAZINE  (APRESOLINE ) 25 MG tablet Take 1 tablet (25 mg total) by mouth every 8 (eight) hours. 04/17/24   Jillian Buttery, MD  Insulin  Pen Needle 29G X MISC For insulin  injection 08/22/21   Jerri Keys, MD  losartan  (COZAAR ) 100 MG tablet Take 1 tablet (100 mg total) by mouth daily. 04/18/24   Jillian Buttery, MD  metoprolol  succinate (TOPROL -XL) 100 MG 24 hr tablet Take 1 tablet (100 mg total) by mouth daily. Take with or immediately following a meal. 04/18/24   Jillian Buttery, MD  oxyCODONE  (OXY IR/ROXICODONE ) 5 MG immediate release tablet Take 1 tablet (5 mg total) by mouth every 6 (six) hours as needed for moderate pain (pain score 4-6), severe pain (pain score 7-10) or breakthrough pain. 04/17/24   Adhikari, Amrit, MD  OZEMPIC, 0.25 OR 0.5 MG/DOSE, 2 MG/3ML SOPN Inject 0.25 mg into the skin every Sunday.    [provider]  polyethylene glycol (MIRALAX  / GLYCOLAX ) 17 g packet Take 17 g by mouth daily. Patient not taking: Reported on 04/11/2024 03/29/24   Patsy Lenis, MD  potassium chloride  SA (KLOR-CON  M) 20 MEQ tablet Take 20  mEq by mouth daily.    [provider]  revefenacin  (YUPELRI ) 175 MCG/3ML nebulizer solution Take 3 mLs (175 mcg total) by nebulization daily. Patient not taking: Reported on 03/27/2024 03/25/24   Lue Elsie BROCKS, MD  senna-docusate (SENOKOT-S) 8.6-50 MG tablet Take 1 tablet by mouth at bedtime as needed for mild constipation. Patient not taking: Reported on 04/11/2024 03/29/24   Patsy Lenis, MD    Allergies: Bee venom, Empagliflozin, Ibuprofen, and Statins    Review of Systems  Gastrointestinal:  Positive for abdominal pain.   All other systems reviewed and are negative.   Updated Vital Signs BP (!) 156/81 (BP Location: Right Arm)   Pulse 93   Temp 98.2 F (36.8 C) (Oral)   Resp 18   Ht 5' 9 (1.753 m)   Wt 122.5 kg   LMP 06/12/2011   SpO2 92%   BMI 39.87 kg/m   Physical Exam Vitals and nursing note reviewed.   Gen: NAD Eyes: PERRL, EOMI HEENT: no oropharyngeal swelling Neck: trachea midline Resp: clear to auscultation bilaterally Card: RRR, no murmurs, rubs, or gallops Abd: Diffusely tender with maximal tenderness of the left lower quadrant with guarding to deep palpation Extremities: no calf tenderness, no edema Vascular: 2+ radial pulses bilaterally, 2+ DP pulses bilaterally Skin: no rashes Psyc: acting appropriately   (all labs ordered are listed, but only abnormal results are displayed) Labs Reviewed  COMPREHENSIVE METABOLIC PANEL WITH GFR - Abnormal; Notable for the following components:      Result Value   Glucose, Bld 169 (*)    BUN 7 (*)    Albumin 3.1 (*)    AST 12 (*)    All other components within normal limits  CBC - Abnormal; Notable for the following components:   Hemoglobin 15.1 (*)    All other components within normal limits  URINALYSIS, ROUTINE W REFLEX MICROSCOPIC - Abnormal; Notable for the following components:   APPearance HAZY (*)    Protein, ur >=300 (*)    Leukocytes,Ua TRACE (*)    All other components within normal limits  LIPASE, BLOOD    EKG: None  Radiology: CT ABDOMEN PELVIS W CONTRAST Result Date: 04/24/2024 EXAM: CT ABDOMEN AND PELVIS WITH CONTRAST 04/24/2024 07:02:00 PM TECHNIQUE: CT of the abdomen and pelvis was performed with the administration of 100 mL iohexol  (OMNIPAQUE ) 300 MG/ML solution. Multiplanar reformatted images are provided for review. Automated exposure control, iterative reconstruction, and/or weight-based adjustment of the mA/kV was utilized to reduce the radiation dose to as low as reasonably achievable. COMPARISON: Chest CT  examination of 08/18/2021. CLINICAL HISTORY: Abdominal pain, acute, nonlocalized; Recent diverticular abscess, worsening sx. FINDINGS: LOWER CHEST: No acute abnormality. LIVER: The liver is unremarkable. 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 The vessel remains patent. GALLBLADDER AND BILE DUCTS: Gallbladder is unremarkable. No biliary ductal dilatation. SPLEEN: No acute abnormality. PANCREAS: No acute abnormality. ADRENAL GLANDS: 13 mm right adrenal nodule is indeterminate on the current examination, but was better assessed on chest CT examination of 08/18/2021 where this was compatible with a benign adrenal adenoma. This is unchanged in size and further follow-up is not required. The left adrenal gland is unremarkable. KIDNEYS, URETERS AND BLADDER: Kidneys are normal in size and position. Multiple punctate nonobstructing calculi are seen within the left kidney with a dominant 9 mm calculus within the lower pole of the left kidney. No hydronephrosis. No ureteral calculi. A subcortical cyst is  seen within the right kidney, for which no follow-up imaging is recommended. The bladder is partially obscured by streak artifact; however, the visualized portion is unremarkable. GI AND BOWEL: Severe sigmoid diverticulosis. Changes of perforated diverticulitis again noted. Extensive inflammatory tissue within the distal sigmoid mesentery with extraluminal gas is again identified and has progressed in terms of density and overall size with the phlegmonous collection now measuring at least 5.4 x 7.8 x 5.5 cm in overall size. Extension of extraluminal gas into the inferior mesenteric vein peripherally again noted in keeping with changes of septic thrombophlebitis. No organized, drainable fluid collection identified. The inflammatory process encases the inferior mesenteric artery distally (5 1/2), however, no active extravasation is  identified. Circumferential wall thickening involving the adjacent distal sigmoid colon is again noted, however, there is no evidence of obstruction. The stomach, small bowel, and large bowel are otherwise unremarkable. The appendix is normal. PERITONEUM AND RETROPERITONEUM: No gross free intraperitoneal gas or fluid. VASCULATURE: Aorta is normal in caliber. Mild aortoiliac atherosclerotic calcification. No aortic aneurysm. LYMPH NODES: No lymphadenopathy. REPRODUCTIVE ORGANS: Streak artifact limits evaluation of the pelvis; however, changes of hysterectomy are suspected. No definite adnexal mass. BONES AND SOFT TISSUES: Status post left total hip arthroplasty. Advanced degenerative changes are seen within the lumbar spine. Remote L1 superior endplate fracture. Degenerative ankylosis L5-S1. Small fat-containing umbilical hernia. No acute bone abnormality. No focal soft tissue abnormality. IMPRESSION: 1. Severe sigmoid diverticulosis with perforated diverticulitis demonstrating progressive phlegmon in the distal sigmoid mesentery and extraluminal gas with septic thrombophlebitis involving peripheral branches of the inferior mesenteric vein. No organized drainable fluid collection. No bowel obstruction. 2. 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 is not as well visualized on the current examination Electronically signed by: Dorethia Molt MD 04/24/2024 08:29 PM EST RP Workstation: [id number]     Procedures   Medications Ordered in the ED  HYDROmorphone  (DILAUDID ) injection 0.5 mg (0.5 mg Intravenous Given 04/24/24 1730)  ondansetron  (ZOFRAN ) injection 4 mg (4 mg Intravenous Given 04/24/24 1727)  iohexol  (OMNIPAQUE ) 300 MG/ML solution 100 mL (100 mLs Intravenous Contrast Given 04/24/24 1846)                                    Medical Decision Making 63 year old female with past medical history of diabetes, hypertension, and diverticulitis presenting to the emergency  department today with with persistent abdominal pain after being treated for diverticulitis with perforation.  I will further evaluate patient here with basic labs including LFTs and lipase and will repeat a CT scan to eval for worsening fluid collection or other intra-abdominal pathology.  I will give patient Dilaudid  and Zofran  for symptoms and reevaluate for disposition.  The patient's labs are largely unremarkable.  CT scan shows worsening diverticulitis with enlarging phlegmon.  Patient is covered with Zosyn .  Calls placed to hospitalist service for admission.  Amount and/or Complexity of Data Reviewed Labs: ordered. Radiology: ordered.  Risk Prescription drug management. Decision regarding hospitalization.        Final diagnoses:  Diverticulitis    ED Discharge Orders     None          Ula Prentice SAUNDERS, MD 04/24/24 2041

## 2024-04-24 NOTE — ED Notes (Signed)
 Pt asked for pain meds. RN informed.

## 2024-04-24 NOTE — Telephone Encounter (Signed)
-----   Message from Maude JONELLE Crease sent at 04/23/2024  7:03 PM EST ----- Call her -- we found the reason for her blood clot.   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  She will need long term blood thinner.   When do I see her?  Jeralyn ----- Message ----- From: Rebecka, [facility] [place] Sent: 04/17/2024   5:35 PM EST To: Maude JONELLE Crease, MD

## 2024-04-24 NOTE — H&P (Signed)
 History and Physical    Lisa Ibarra FMW:[id number] DOB: 1960-10-07 DOA: 04/24/2024  PCP: Patient, No Pcp Per  Patient coming from: Home  Chief Complaint: Abdominal pain  HPI: Lisa Ibarra is a 63 y.o. female with medical history significant of COPD, HFpEF, hypertension, hyperlipidemia, type 2 diabetes, chronic pain syndrome, thrombocytopenia, obesity, marijuana and tobacco abuse.  She was admitted to hospital 11/3-11/11/2023 for acute diverticulitis, bacteremia, and COPD/asthma exacerbation.  Previous blood cultures from 10/30 growing Pseudomonas and E. coli.  Repeat cultures remained negative.  Admitted again 11/19-11/26 for acute complicated sigmoid diverticulitis with microperforation and possible abscess.  Patient was treated with IV antibiotics and follow-up CT abdomen pelvis on 11/23 showed portal vein thrombosis so she was started on Eliquis .  Patient was seen by her PCP today due to persistent abdominal pain and sent to the ED for further evaluation.  Patient states she has continued to have severe pain across her entire lower abdomen since her hospital discharge.  Also reporting nausea but no episodes of vomiting.  Reporting fevers and chills.  Patient states she was constipated when she was in the hospital but after going home she drank prune juice and afterwards started having regular bowel movements.  She ran out of oral antibiotics 3 days ago.  She is taking Eliquis  but does not remember when she last took it.  ED Course: Vital signs on arrival: Temperature 98.8 F, pulse 83, respiratory rate 18, blood pressure 149/91, and SpO2 97% on room air.  Later desatted to 85% on room air after receiving IV opiates for pain and was placed on 3 L [place].  Labs notable for WBC count 10.5, hemoglobin 15.1, platelet count 198k, glucose 169, BUN 7, creatinine 0.67, albumin 3.1, no elevation of lipase or LFTs, UA not suggestive of infection.  CT abdomen pelvis with contrast showing severe sigmoid  diverticulosis with perforated diverticulitis demonstrating progressive phlegmon in the distal sigmoid mesentery and extraluminal gas with septic thrombophlebitis involving peripheral branches of the inferior mesenteric vein.  No organized drainable fluid collection seen.  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 is not well-visualized on the current examination.  Patient was given IV Dilaudid , Zofran , and Zosyn .  General surgery consulted.  Review of Systems:  Review of Systems  All other systems reviewed and are negative.   Past Medical History:  Diagnosis Date   Asthma    Cancer (HCC)    Cannabis abuse    Chronic pain    Diabetes mellitus type 2, controlled (HCC)    Hypertension     Past Surgical History:  Procedure Laterality Date   CESAREAN SECTION     JOINT REPLACEMENT     Left eye     TUBAL LIGATION     TUMOR REMOVAL from back       reports that she has been smoking cigarettes. She has never used smokeless tobacco. She reports current drug use. Drug: Marijuana. She reports that she does not drink alcohol.  Allergies  Allergen Reactions   Bee Venom Anaphylaxis and Hives   Empagliflozin Itching, Rash and Dermatitis   Ibuprofen Nausea And Vomiting   Statins Nausea Only and Other (See Comments)    Muscle spasms and cramps, too    Family History  Problem Relation Age of Onset   Stroke Mother    Lung cancer Father    Diabetes Mellitus II Sister     Prior to Admission medications   Medication Sig Start Date End Date  Taking? Authorizing Provider  albuterol  (VENTOLIN  HFA) 108 (90 Base) MCG/ACT inhaler Inhale 2 puffs into the lungs every 6 (six) hours as needed for wheezing or shortness of breath. 08/22/21   Jerri Keys, MD  apixaban  (ELIQUIS ) 5 MG TABS tablet Take 2 tablets (10 mg total) by mouth 2 (two) times daily for 5 days, THEN 1 tablet (5 mg total) 2 (two) times daily. 04/17/24 05/22/24  Jillian Buttery, MD  arformoterol  (BROVANA ) 15  MCG/2ML NEBU Take 2 mLs (15 mcg total) by nebulization 2 (two) times daily. Patient not taking: Reported on 03/27/2024 03/24/24   Lue Elsie BROCKS, MD  Aspirin-Acetaminophen -Caffeine  (GOODYS EXTRA STRENGTH) 500-325-65 MG PACK Take 1 packet by mouth See admin instructions. Dissolve 1 packet in the mouth up to six times a day as needed for pain    [provider]  atorvastatin  (LIPITOR) 20 MG tablet Take 20 mg by mouth daily.    [provider]  blood glucose meter kit and supplies KIT Dispense based on patient and insurance preference. Use up to four times daily as directed. 08/22/21   Jerri Keys, MD  budesonide  (PULMICORT ) 0.5 MG/2ML nebulizer solution Take 2 mLs (0.5 mg total) by nebulization 2 (two) times daily. Patient not taking: Reported on 03/27/2024 03/24/24   Lue Elsie BROCKS, MD  furosemide  (LASIX ) 20 MG tablet Take 20-40 mg by mouth daily as needed for fluid or edema.    [provider]  hydrALAZINE  (APRESOLINE ) 25 MG tablet Take 1 tablet (25 mg total) by mouth every 8 (eight) hours. 04/17/24   Jillian Buttery, MD  Insulin  Pen Needle 29G X MISC For insulin  injection 08/22/21   Jerri Keys, MD  losartan  (COZAAR ) 100 MG tablet Take 1 tablet (100 mg total) by mouth daily. 04/18/24   Jillian Buttery, MD  metoprolol  succinate (TOPROL -XL) 100 MG 24 hr tablet Take 1 tablet (100 mg total) by mouth daily. Take with or immediately following a meal. 04/18/24   Jillian Buttery, MD  oxyCODONE  (OXY IR/ROXICODONE ) 5 MG immediate release tablet Take 1 tablet (5 mg total) by mouth every 6 (six) hours as needed for moderate pain (pain score 4-6), severe pain (pain score 7-10) or breakthrough pain. 04/17/24   Adhikari, Amrit, MD  OZEMPIC, 0.25 OR 0.5 MG/DOSE, 2 MG/3ML SOPN Inject 0.25 mg into the skin every Sunday.    [provider]  polyethylene glycol (MIRALAX  / GLYCOLAX ) 17 g packet Take 17 g by mouth daily. Patient not taking: Reported on 04/11/2024 03/29/24   Patsy Lenis, MD  potassium chloride  SA (KLOR-CON  M) 20 MEQ tablet Take 20 mEq by mouth daily.    [provider]  revefenacin  (YUPELRI ) 175 MCG/3ML nebulizer solution Take 3 mLs (175 mcg total) by nebulization daily. Patient not taking: Reported on 03/27/2024 03/25/24   Lue Elsie BROCKS, MD  senna-docusate (SENOKOT-S) 8.6-50 MG tablet Take 1 tablet by mouth at bedtime as needed for mild constipation. Patient not taking: Reported on 04/11/2024 03/29/24   Patsy Lenis, MD    Physical Exam: Vitals:   04/24/24 1730 04/24/24 1830 04/24/24 2000 04/24/24 2123  BP: (!) 159/86 (!) 174/72 (!) 156/81   Pulse: 83 85 93   Resp:   18   Temp:    98.7 F (37.1 C)  TempSrc:    Oral  SpO2: 98% 91% 92%   Weight:      Height:        Physical Exam Vitals reviewed.  Constitutional:      General: She  is not in acute distress. HENT:     Head: Normocephalic and atraumatic.  Eyes:     Extraocular Movements: Extraocular movements intact.  Cardiovascular:     Rate and Rhythm: Normal rate and regular rhythm.     Heart sounds: Normal heart sounds.  Pulmonary:     Effort: Pulmonary effort is normal. No respiratory distress.     Breath sounds: Normal breath sounds. No wheezing or rales.  Abdominal:     General: Bowel sounds are normal.     Palpations: Abdomen is soft.     Tenderness: There is no abdominal tenderness. There is no guarding.  Musculoskeletal:     Cervical back: Normal range of motion.     Right lower leg: No edema.     Left lower leg: No edema.  Skin:    General: Skin is warm and dry.  Neurological:     General: No focal deficit present.     Mental Status: She is alert and oriented to person, place, and time.     Labs on Admission: I have personally reviewed following labs and imaging studies  CBC: Recent Labs  Lab 04/24/24 1444  WBC 10.5  HGB 15.1*  HCT 45.2  MCV 94.6  PLT 198   Basic Metabolic Panel: Recent Labs  Lab 04/24/24 1444  NA 136  K 3.6  CL 101   CO2 25  GLUCOSE 169*  BUN 7*  CREATININE 0.67  CALCIUM  9.4   GFR: Estimated Creatinine Clearance: 100.8 mL/min (by C-G formula based on SCr of 0.67 mg/dL). Liver Function Tests: Recent Labs  Lab 04/24/24 1444  AST 12*  ALT 8  ALKPHOS 81  BILITOT 0.4  PROT 7.7  ALBUMIN 3.1*   Recent Labs  Lab 04/24/24 1444  LIPASE 18   No results for input(s): AMMONIA in the last 168 hours. Coagulation Profile: No results for input(s): INR, PROTIME in the last 168 hours. Cardiac Enzymes: No results for input(s): CKTOTAL, CKMB, CKMBINDEX, TROPONINI in the last 168 hours. BNP (last 3 results) Recent Labs    03/21/24 1310 03/25/24 1938  PROBNP 2,893.0* 646.0*   HbA1C: No results for input(s): HGBA1C in the last 72 hours. CBG: No results for input(s): GLUCAP in the last 168 hours. Lipid Profile: No results for input(s): CHOL, HDL, LDLCALC, TRIG, CHOLHDL, LDLDIRECT in the last 72 hours. Thyroid Function Tests: No results for input(s): TSH, T4TOTAL, FREET4, T3FREE, THYROIDAB in the last 72 hours. Anemia Panel: No results for input(s): VITAMINB12, FOLATE, FERRITIN, TIBC, IRON, RETICCTPCT in the last 72 hours. Urine analysis:    Component Value Date/Time   COLORURINE YELLOW 04/24/2024 1814   APPEARANCEUR HAZY (A) 04/24/2024 1814   LABSPEC 1.020 04/24/2024 1814   PHURINE 5.0 04/24/2024 1814   GLUCOSEU NEGATIVE 04/24/2024 1814   HGBUR NEGATIVE 04/24/2024 1814   BILIRUBINUR NEGATIVE 04/24/2024 1814   KETONESUR NEGATIVE 04/24/2024 1814   PROTEINUR >=300 (A) 04/24/2024 1814   UROBILINOGEN 1.0 04/01/2015 0948   NITRITE NEGATIVE 04/24/2024 1814   LEUKOCYTESUR TRACE (A) 04/24/2024 1814    Radiological Exams on Admission: CT ABDOMEN PELVIS W CONTRAST Result Date: 04/24/2024 EXAM: CT ABDOMEN AND PELVIS WITH CONTRAST 04/24/2024 07:02:00 PM TECHNIQUE: CT of the abdomen and pelvis was performed with the administration of 100 mL iohexol   (OMNIPAQUE ) 300 MG/ML solution. Multiplanar reformatted images are provided for review. Automated exposure control, iterative reconstruction, and/or weight-based adjustment of the mA/kV was utilized to reduce the radiation dose to as low as reasonably achievable. COMPARISON: Chest  CT examination of 08/18/2021. CLINICAL HISTORY: Abdominal pain, acute, nonlocalized; Recent diverticular abscess, worsening sx. FINDINGS: LOWER CHEST: No acute abnormality. LIVER: The liver is unremarkable. 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 The vessel remains patent. GALLBLADDER AND BILE DUCTS: Gallbladder is unremarkable. No biliary ductal dilatation. SPLEEN: No acute abnormality. PANCREAS: No acute abnormality. ADRENAL GLANDS: 13 mm right adrenal nodule is indeterminate on the current examination, but was better assessed on chest CT examination of 08/18/2021 where this was compatible with a benign adrenal adenoma. This is unchanged in size and further follow-up is not required. The left adrenal gland is unremarkable. KIDNEYS, URETERS AND BLADDER: Kidneys are normal in size and position. Multiple punctate nonobstructing calculi are seen within the left kidney with a dominant 9 mm calculus within the lower pole of the left kidney. No hydronephrosis. No ureteral calculi. A subcortical cyst is seen within the right kidney, for which no follow-up imaging is recommended. The bladder is partially obscured by streak artifact; however, the visualized portion is unremarkable. GI AND BOWEL: Severe sigmoid diverticulosis. Changes of perforated diverticulitis again noted. Extensive inflammatory tissue within the distal sigmoid mesentery with extraluminal gas is again identified and has progressed in terms of density and overall size with the phlegmonous collection now measuring at least 5.4 x 7.8 x 5.5 cm in overall size. Extension of extraluminal gas  into the inferior mesenteric vein peripherally again noted in keeping with changes of septic thrombophlebitis. No organized, drainable fluid collection identified. The inflammatory process encases the inferior mesenteric artery distally (5 1/2), however, no active extravasation is identified. Circumferential wall thickening involving the adjacent distal sigmoid colon is again noted, however, there is no evidence of obstruction. The stomach, small bowel, and large bowel are otherwise unremarkable. The appendix is normal. PERITONEUM AND RETROPERITONEUM: No gross free intraperitoneal gas or fluid. VASCULATURE: Aorta is normal in caliber. Mild aortoiliac atherosclerotic calcification. No aortic aneurysm. LYMPH NODES: No lymphadenopathy. REPRODUCTIVE ORGANS: Streak artifact limits evaluation of the pelvis; however, changes of hysterectomy are suspected. No definite adnexal mass. BONES AND SOFT TISSUES: Status post left total hip arthroplasty. Advanced degenerative changes are seen within the lumbar spine. Remote L1 superior endplate fracture. Degenerative ankylosis L5-S1. Small fat-containing umbilical hernia. No acute bone abnormality. No focal soft tissue abnormality. IMPRESSION: 1. Severe sigmoid diverticulosis with perforated diverticulitis demonstrating progressive phlegmon in the distal sigmoid mesentery and extraluminal gas with septic thrombophlebitis involving peripheral branches of the inferior mesenteric vein. No organized drainable fluid collection. No bowel obstruction. 2. 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 is not as well visualized on the current examination Electronically signed by: Dorethia Molt MD 04/24/2024 08:29 PM EST RP Workstation: [id number]    Assessment and Plan  Acute severe/complicated diverticulitis 2 recent hospital admissions for this problem and was treated with antibiotics.  Repeat CT done today showing severe sigmoid diverticulosis with  perforated diverticulitis demonstrating progressive phlegmon in the distal sigmoid mesentery and extraluminal gas with septic thrombophlebitis involving peripheral branches of the inferior mesenteric vein.  No organized drainable fluid collection seen. WBC count 10.5, no signs of sepsis.  General Surgery consulted. Continue Zosyn , Keep n.p.o. except sips with meds, gentle IV fluid hydration, pain management, and antiemetic as needed.  EKG ordered to check QT interval.  Trend WBC count and check lactate.  Portal vein thrombosis Recent CT abdomen pelvis with contrast done 11/23 showed showing signs of portal venous thrombosis with  clot noted in the left lobe of the liver branch of the portal vein, extrahepatic portal vein, portal venous confluence and SMV.  Patient was discharged on Eliquis .  However, on repeat CT tonight, previously identified portal vein thrombosis is not well-visualized.  Discussed with general surgery Dr. Debby.  Will hold Eliquis  for now and start IV heparin  for anticoagulation.  Hypoxemia Patient was not hypoxic on arrival to the ED but later desatted to 85% on room air after receiving IV opiates for pain.  Currently satting well on 3 L [place].  Lungs clear on exam and no acute abnormality seen in the lower chest on CT abdomen pelvis.  Continue supplemental oxygen.  Continuous pulse ox.  COPD Stable, no signs of acute exacerbation.  Continue home inhalers.  Chronic HFpEF Echo done 03/22/2024 showing EF 55 to 60% and grade 1 diastolic dysfunction.  No signs of volume overload at this time.  Type 2 diabetes Hemoglobin A1c 7.0 on 03/21/2024.  Placed on sensitive sliding scale insulin  every 4 hours.  Hypertension SBP in the 150s.  Continue metoprolol  and hydralazine .  DVT prophylaxis: IV heparin  gtt Code Status: Full Code (discussed with the patient) Level of care: Progressive Care Unit Admission status: It is my clinical opinion that admission to INPATIENT is reasonable and  necessary because of the expectation that this patient will require hospital care that crosses at least 2 midnights to treat this condition based on the medical complexity of the problems presented.  Given the aforementioned information, the predictability of an adverse outcome is felt to be significant.  Editha Ram MD [facility]  If 7PM-7AM, please contact night-coverage [email or web address]  04/24/2024, 10:47 PM

## 2024-04-25 DIAGNOSIS — K5792 Diverticulitis of intestine, part unspecified, without perforation or abscess without bleeding: Secondary | ICD-10-CM | POA: Diagnosis not present

## 2024-04-25 DIAGNOSIS — E66812 Obesity, class 2: Secondary | ICD-10-CM

## 2024-04-25 DIAGNOSIS — I81 Portal vein thrombosis: Secondary | ICD-10-CM | POA: Diagnosis present

## 2024-04-25 LAB — BASIC METABOLIC PANEL WITH GFR
Anion gap: 9 (ref 5–15)
BUN: 5 mg/dL — ABNORMAL LOW (ref 8–23)
CO2: 26 mmol/L (ref 22–32)
Calcium: 8.8 mg/dL — ABNORMAL LOW (ref 8.9–10.3)
Chloride: 102 mmol/L (ref 98–111)
Creatinine, Ser: 0.64 mg/dL (ref 0.44–1.00)
GFR, Estimated: >60 mL/min (ref >60)
Glucose, Bld: 171 mg/dL — ABNORMAL HIGH (ref 70–99)
Potassium: 3.6 mmol/L (ref 3.5–5.1)
Sodium: 136 mmol/L (ref 135–145)

## 2024-04-25 LAB — CBC
HCT: 43.8 % (ref 36.0–46.0)
Hemoglobin: 14.4 g/dL (ref 12.0–15.0)
MCH: 31.6 pg (ref 26.0–34.0)
MCHC: 32.9 g/dL (ref 30.0–36.0)
MCV: 96.3 fL (ref 80.0–100.0)
Platelets: 160 K/uL (ref 150–400)
RBC: 4.55 MIL/uL (ref 3.87–5.11)
RDW: 13.1 % (ref 11.5–15.5)
WBC: 8 K/uL (ref 4.0–10.5)
nRBC: 0 % (ref 0.0–0.2)

## 2024-04-25 LAB — HEPARIN LEVEL (UNFRACTIONATED): Heparin Unfractionated: >1.1 [IU]/mL — ABNORMAL HIGH (ref 0.30–0.70)

## 2024-04-25 LAB — GLUCOSE, CAPILLARY
Glucose-Capillary: 123 mg/dL — ABNORMAL HIGH (ref 70–99)
Glucose-Capillary: 151 mg/dL — ABNORMAL HIGH (ref 70–99)
Glucose-Capillary: 179 mg/dL — ABNORMAL HIGH (ref 70–99)

## 2024-04-25 LAB — LACTIC ACID, PLASMA: Lactic Acid, Venous: 0.9 mmol/L (ref 0.5–1.9)

## 2024-04-25 LAB — CBG MONITORING, ED
Glucose-Capillary: 156 mg/dL — ABNORMAL HIGH (ref 70–99)
Glucose-Capillary: 160 mg/dL — ABNORMAL HIGH (ref 70–99)
Glucose-Capillary: 185 mg/dL — ABNORMAL HIGH (ref 70–99)

## 2024-04-25 LAB — APTT
aPTT: 37 s — ABNORMAL HIGH (ref 24–36)
aPTT: 55 s — ABNORMAL HIGH (ref 24–36)
aPTT: 85 s — ABNORMAL HIGH (ref 24–36)

## 2024-04-25 MED ORDER — LORAZEPAM 2 MG/ML IJ SOLN
0.5000 mg | INTRAMUSCULAR | Status: DC | PRN
  Administered 2024-04-26 – 2024-04-29 (×2): 0.5 mg via INTRAVENOUS
  Filled 2024-04-25 (×2): qty 1

## 2024-04-25 MED ORDER — HYDROMORPHONE HCL 1 MG/ML IJ SOLN
0.5000 mg | INTRAMUSCULAR | Status: DC | PRN
  Administered 2024-04-25 – 2024-04-26 (×9): 0.5 mg via INTRAVENOUS
  Filled 2024-04-25 (×9): qty 0.5

## 2024-04-25 MED ORDER — INFLUENZA VIRUS VACC SPLIT PF (FLUZONE) 0.5 ML IM SUSY
0.5000 mL | PREFILLED_SYRINGE | INTRAMUSCULAR | Status: AC
  Administered 2024-04-27: 0.5 mL via INTRAMUSCULAR
  Filled 2024-04-25: qty 0.5

## 2024-04-25 MED ORDER — HEPARIN (PORCINE) 25000 UT/250ML-% IV SOLN
1800.0000 [IU]/h | INTRAVENOUS | Status: DC
  Administered 2024-04-25: 1600 [IU]/h via INTRAVENOUS
  Administered 2024-04-25: 1800 [IU]/h via INTRAVENOUS
  Filled 2024-04-25 (×2): qty 250

## 2024-04-25 MED ORDER — SODIUM CHLORIDE 0.9 % IV SOLN: INTRAVENOUS | Status: AC

## 2024-04-25 NOTE — Assessment & Plan Note (Addendum)
 Hemoglobin A1c 7.0, good control Placed on sensitive sliding scale insulin  every 4 hours

## 2024-04-25 NOTE — Progress Notes (Signed)
 Progress Note   Patient: Lisa Ibarra FMW:[id number] DOB: [birth year] DOA: 04/24/2024     1 DOS: the patient was seen and examined on 04/25/2024   Brief hospital course: 63yo with h/o COPD, chronic HFpEF, HTN, HLD, T2DM, chronic pain syndrome, class 2 obesity, and tobacco and marijuana use who presented on 12/3 with abdominal pain.  She was previously admitted from 11/3-7 for acute diverticulitis with bacteremia with Pseudomonas and E coli.  She returned from 11/19-26 with diverticulitis with microperforation and possible abscess that was treated with antibiotics; she was also found to have a portal vein thrombosis and started on Eliquis .  She returned to the ER due to persistent abdominal pain, having run out of antibiotics 3 days PTA.  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  She was treated with Dilaudid , Zofran , and Zosyn  and surgery was consulted.  Assessment and Plan:  Assessment & Plan Acute diverticulitis 2 recent hospital admissions for this problem, treated with antibiotics Repeat CT now shows severe sigmoid diverticulosis with perforated diverticulitis demonstrating progressive phlegmon in the distal sigmoid mesentery and extraluminal gas with septic thrombophlebitis involving peripheral branches of the inferior mesenteric vein WBC count 10.5, no signs of sepsis General Surgery consulted Continue Zosyn  Anticipate colectomy with ostomy creation tomorrow Pain control with Oxy, Dilaudid ; anxiety control with Ativan  Chronic heart failure with preserved ejection fraction (HFpEF) (HCC) Hypoxemia COPD (chronic obstructive pulmonary disease) (HCC) Patient was not hypoxic on arrival to the ED but later desatted to 87% on room air after receiving IV opiates for pain Currently satting well on RA  Echo done 03/22/2024 showing EF  55 to 60% and grade 1 diastolic dysfunction; no signs of volume overload at this time Diabetes mellitus (HCC) Hemoglobin A1c 7.0, good control Placed on sensitive sliding scale insulin  every 4 hours 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  Patient was discharged on Eliquis  Hold Eliquis  for now and start IV heparin  for anticoagulation Essential hypertension BP currently 158/84 Continue metoprolol  and hydralazine  Class 2 obesity due to excess calories with body mass index (BMI) of 39.0 to 39.9 in adult Body mass index is 39.87 kg/m.SABRA  Weight loss should be encouraged Outpatient PCP/bariatric medicine f/u encouraged Significantly low or high BMI is associated with higher medical risk including morbidity and mortality        Consultants: Surgery  Procedures: None  Antibiotics: Zosyn  12/3-  30 Day Unplanned Readmission Risk Score    Flowsheet Row ED to Hosp-Admission (Current) from 04/24/2024 in [facility] Emergency Department at [facility]  30 Day Unplanned Readmission Risk Score (%) 23.55 Filed at 04/25/2024 0802    This score is the patient's risk of an unplanned readmission within 30 days of being discharged (0 -100%). The score is based on dignosis, age, lab data, medications, orders, and past utilization.   Low:  0-14.9   Medium: 15-21.9   High: 22-29.9   Extreme: 30 and above           Subjective: She was extremely upset this AM, particularly about not being able to eat (also about being unable to reach her daughter by telephone - addressed; about not being assigned a room - addressed; about pain - addressed).  She understands the plan for surgery with probable colostomy tomorrow.  Physical Exam: Vitals:   04/25/24 0500 04/25/24 0555 04/25/24 0725 04/25/24 0828  BP: (!) 171/106  ROLLEN)  191/101 (!) 157/119  Pulse: 80   80  Resp: (!) 21   20  Temp:  98.6 F (37 C)    TempSrc:  Oral    SpO2: 94%   93%  Weight:      Height:          No intake or output data in the 24 hours ending 04/25/24 0917 Filed Weights   04/24/24 1426  Weight: 122.5 kg    Exam:  General:  Appears calm and comfortable and is in NAD Eyes:  normal lids, iris ENT:  grossly normal hearing, lips & tongue, mmm Cardiovascular:  RRR, no m/r/g. No LE edema.  Respiratory:   CTA bilaterally with no wheezes/rales/rhonchi.  Normal respiratory effort. Abdomen:  diffuse lower abdominal TTP Skin:  no rash or induration seen on limited exam Musculoskeletal:  grossly normal tone BUE/BLE, good ROM, no bony abnormality Psychiatric: anxious/emotionally labile mood and affect, speech fluent and appropriate, AOx3 Neurologic:  CN 2-12 grossly intact, moves all extremities in coordinated fashion  Data Reviewed: I have reviewed the patient's lab results since admission.  Pertinent labs for today include:   Glucose 171 Normal CBC    Family Communication: None present but she spoke with her daughter by telephone while I was in the room  Disposition: Status is: Inpatient Remains inpatient appropriate because: planned for surgery  Planned Discharge Destination: Home    Time spent: 50 minutes  Author: Delon Herald, MD 04/25/2024 9:07 AM  For on call review [email or web address].

## 2024-04-25 NOTE — Progress Notes (Signed)
 PHARMACY - ANTICOAGULATION CONSULT NOTE  Pharmacy Consult for Heparin  Indication: atrial fibrillation & portal vein thrombosis  Allergies  Allergen Reactions   Bee Venom Anaphylaxis and Hives   Empagliflozin Itching, Rash and Dermatitis   Ibuprofen Nausea And Vomiting   Statins Nausea Only and Other (See Comments)    Muscle spasms and cramps, too    Patient Measurements: Height: 5' 9 (175.3 cm) Weight: 122.5 kg (270 lb) IBW/kg (Calculated) : 66.2 HEPARIN  DW (KG): 94.7  Vital Signs: Temp: 98.7 F (37.1 C) (12/04 1708) Temp Source: Oral (12/04 1708) BP: 142/81 (12/04 1708) Pulse Rate: 75 (12/04 1708)  Labs: Recent Labs    04/24/24 1444 04/25/24 0017 04/25/24 0516 04/25/24 0818 04/25/24 1658  HGB 15.1*  --  14.4  --   --   HCT 45.2  --  43.8  --   --   PLT 198  --  160  --   --   APTT  --  37*  --  55* 85*  HEPARINUNFRC  --  >1.10*  --   --   --   CREATININE 0.67  --  0.64  --   --     Estimated Creatinine Clearance: 100.8 mL/min (by C-G formula based on SCr of 0.64 mg/dL).   Medical History: Past Medical History:  Diagnosis Date   Asthma    Cancer (HCC)    Cannabis abuse    Chronic pain    Diabetes mellitus type 2, controlled (HCC)    Hypertension     Medications:  PTA apixaban  5mg  po BID - patient unable to state when last dose taken.  Assessment: 63 yr female with abdominal pain.  PMH significant for AFib, portal vein thrombosis and on Eliquis  PTA.  Recent hospitalization for sigmoid diverticulitis with microperforation and possible abscess.  Surgery consulted for possible surgical resection Eliquis  on hold and IV heparin  to begin Will monitor heparin  therapy with aPTT as heparin  levels can be falsely elevated due to recent DOAC use. Will use aPTT until effects of DOAC have worn off and aPTT and heparin  levels correlate  PM, 04/25/24  aPTT now therapeutic after increase in heparin  rate earlier today, currently on 1800 units/hr CBC WNL No bleeding,  no disruption in infusion, or other heparin  related complications per nurse  Goal of Therapy:  Heparin  level 0.3-0.7 units/ml aPTT 66-102 seconds Monitor platelets by anticoagulation protocol: Yes   Plan:  Continue current heparin  IV rate of 1800 units/hr Recheck aPTT in 6 hours for confirmation Monitor daily aPTT & heparin  level until correlating, CBC, signs/symptoms of bleeding   Eva CHRISTELLA Allis, PharmD, BCPS Secure Chat if ?s 04/25/2024 5:37 PM

## 2024-04-25 NOTE — Assessment & Plan Note (Addendum)
 Patient was not hypoxic on arrival to the ED but later desatted to 87% on room air after receiving IV opiates for pain Currently satting well on RA  Echo done 03/22/2024 showing EF 55 to 60% and grade 1 diastolic dysfunction; no signs of volume overload at this time

## 2024-04-25 NOTE — ED Notes (Signed)
 Patient was tearful in room due to pain and being uncomfortable in the bed. Offered patient a recliner and patient said yes please. Moved the bed over and placed recliner in the room next to the nurse desk area in room. Got patient situated and hooked onto monitor and IV pump. Patient was given warm blankets and pillow for comfort. Patient stated thank you and requested tissues from crying. Gave patient side table and remote was placed beside her on the bed station next to her. Patient is resting at this time.

## 2024-04-25 NOTE — Assessment & Plan Note (Addendum)
 2 recent hospital admissions for this problem, treated with antibiotics Repeat CT now shows severe sigmoid diverticulosis with perforated diverticulitis demonstrating progressive phlegmon in the distal sigmoid mesentery and extraluminal gas with septic thrombophlebitis involving peripheral branches of the inferior mesenteric vein WBC count 10.5, no signs of sepsis General Surgery consulted Continue Zosyn  Anticipate colectomy with ostomy creation tomorrow Pain control with Oxy, Dilaudid ; anxiety control with Ativan 

## 2024-04-25 NOTE — Assessment & Plan Note (Addendum)
 CT11/23 showed portal venous thrombosis  Patient was discharged on Eliquis  Hold Eliquis  for now and start IV heparin  for anticoagulation

## 2024-04-25 NOTE — Progress Notes (Cosign Needed)
 Lisa Ibarra   DOB:[birth year]   FM#:[id number]      ASSESSMENT & PLAN:  Lisa Ibarra is a 63 year old female patient who came to the ED 04/24/2024 complaining of severe abdominal pain.  Patient with previous hospitalizations for same and previously diagnosed with acute diverticulitis.  Hematology following for history of portal vein thrombus, on Eliquis .  Abdominal pain, severe Acute diverticulitis, recurrent - Patient with multiple admissions for acute diverticulitis. -Imaging 04/24/2024 shows severe sigmoid diverticulosis with perforated diverticulitis. - Continue pain medications per orders  History of portal vein thrombosis - Imaging done last hospitalization on 04/14/2024 showed signs of portal vein thrombosis with clot in left lobe of liver. - Patient was started on anticoagulation with Eliquis  during last admission and was discharged home with recommendation to continue Eliquis .  During discussion with patient today she could not remember the last time she took Eliquis . - On IV heparin , okay to continue per protocol at this time. - Hematology/Dr. Timmy will make further recommendations  Diabetes Hypertension - Continue to monitor blood glucose levels - Continue to monitor blood pressure closely and continue antihypertensives.     Code Status Full   Subjective:  Patient seen awake and alert laying in bed in the ED awaiting admission to regular floor.  States that she came back because she has severe abdominal pain, entire abdomen.  States that this pain has persisted since her last hospitalization.  Asked patient if she had been taking her Eliquis  at home and she responded I took everything they told me to take.  Did not remember when she last took Eliquis .  No other complaints.  Objective:  No intake or output data in the 24 hours ending 04/25/24 1011   PHYSICAL EXAMINATION: ECOG PERFORMANCE STATUS: 2 - Symptomatic, <50% confined to bed  Vitals:   04/25/24 0955  04/25/24 0959  BP: (!) 153/121   Pulse: 85   Resp:    Temp:  98.4 F (36.9 C)  SpO2:     Filed Weights   04/24/24 1426  Weight: 270 lb (122.5 kg)    GENERAL: alert, no distress and comfortable SKIN: skin color, texture, turgor are normal, no rashes or significant lesions EYES: normal, conjunctiva are pink and non-injected, sclera clear OROPHARYNX: no exudate, no erythema and lips, buccal mucosa, and tongue normal  NECK: supple, thyroid normal size, non-tender, without nodularity LYMPH: no palpable lymphadenopathy in the cervical, axillary or inguinal LUNGS: clear to auscultation and percussion with normal breathing effort HEART: regular rate & rhythm and no murmurs and no lower extremity edema ABDOMEN: abdomen soft, non-tender and normal bowel sounds MUSCULOSKELETAL: no cyanosis of digits and no clubbing  PSYCH: alert & oriented x 3 with fluent speech NEURO: no focal motor/sensory deficits   All questions were answered. The patient knows to call the clinic with any problems, questions or concerns.   The total time spent in the appointment was 40 minutes encounter with patient including review of chart and various tests results, discussions about plan of care and coordination of care plan  Olam JINNY Brunner, NP 04/25/2024 10:11 AM    Labs Reviewed:  Lab Results  Component Value Date   WBC 8.0 04/25/2024   HGB 14.4 04/25/2024   HCT 43.8 04/25/2024   MCV 96.3 04/25/2024   PLT 160 04/25/2024   Recent Labs    04/10/24 1548 04/11/24 0110 04/12/24 0444 04/13/24 0516 04/14/24 0514 04/17/24 0503 04/24/24 1444 04/25/24 0516  NA 136 138   < >  136 136 134* 136 136  K 3.6 3.5   < > 4.0 3.6 3.9 3.6 3.6  CL 100 103   < > 102 102 99 101 102  CO2 25 26   < > 24 25 24 25 26   GLUCOSE 279* 195*   < > 145* 162* 212* 169* 171*  BUN 5* <5*   < > <5* <5* 11 7* 5*  CREATININE 0.64 0.72   < > 0.74 0.71 0.82 0.67 0.64  CALCIUM  9.2 9.1   < > 9.1 9.1 9.8 9.4 8.8*  GFRNONAA >60 >60   < >  >60 >60 >60 >60 >60  PROT 7.5 7.3  --   --   --   --  7.7  --   ALBUMIN  3.3* 3.0*   < > 3.0* 3.0*  --  3.1*  --   AST 11* 11*  --   --   --   --  12*  --   ALT <5 5  --   --   --   --  8  --   ALKPHOS 87 81  --   --   --   --  81  --   BILITOT 0.7 0.7  --   --   --   --  0.4  --   BILIDIR 0.3*  --   --   --   --   --   --   --   IBILI 0.3  --   --   --   --   --   --   --    < > = values in this interval not displayed.    Studies Reviewed:   CT ABDOMEN PELVIS W CONTRAST Result Date: 04/24/2024 EXAM: CT ABDOMEN AND PELVIS WITH CONTRAST 04/24/2024 07:02:00 PM TECHNIQUE: CT of the abdomen and pelvis was performed with the administration of 100 mL iohexol  (OMNIPAQUE ) 300 MG/ML solution. Multiplanar reformatted images are provided for review. Automated exposure control, iterative reconstruction, and/or weight-based adjustment of the mA/kV was utilized to reduce the radiation dose to as low as reasonably achievable. COMPARISON: Chest CT examination of 08/18/2021. CLINICAL HISTORY: Abdominal pain, acute, nonlocalized; Recent diverticular abscess, worsening sx. FINDINGS: LOWER CHEST: No acute abnormality. LIVER: The liver is unremarkable. 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 The vessel remains patent. GALLBLADDER AND BILE DUCTS: Gallbladder is unremarkable. No biliary ductal dilatation. SPLEEN: No acute abnormality. PANCREAS: No acute abnormality. ADRENAL GLANDS: 13 mm right adrenal nodule is indeterminate on the current examination, but was better assessed on chest CT examination of 08/18/2021 where this was compatible with a benign adrenal adenoma. This is unchanged in size and further follow-up is not required. The left adrenal gland is unremarkable. KIDNEYS, URETERS AND BLADDER: Kidneys are normal in size and position. Multiple punctate nonobstructing calculi are seen within the left kidney with a dominant 9 mm  calculus within the lower pole of the left kidney. No hydronephrosis. No ureteral calculi. A subcortical cyst is seen within the right kidney, for which no follow-up imaging is recommended. The bladder is partially obscured by streak artifact; however, the visualized portion is unremarkable. GI AND BOWEL: Severe sigmoid diverticulosis. Changes of perforated diverticulitis again noted. Extensive inflammatory tissue within the distal sigmoid mesentery with extraluminal gas is again identified and has progressed in terms of density and overall size with the phlegmonous collection now measuring at least 5.4 x 7.8 x  5.5 cm in overall size. Extension of extraluminal gas into the inferior mesenteric vein peripherally again noted in keeping with changes of septic thrombophlebitis. No organized, drainable fluid collection identified. The inflammatory process encases the inferior mesenteric artery distally (5 1/2), however, no active extravasation is identified. Circumferential wall thickening involving the adjacent distal sigmoid colon is again noted, however, there is no evidence of obstruction. The stomach, small bowel, and large bowel are otherwise unremarkable. The appendix is normal. PERITONEUM AND RETROPERITONEUM: No gross free intraperitoneal gas or fluid. VASCULATURE: Aorta is normal in caliber. Mild aortoiliac atherosclerotic calcification. No aortic aneurysm. LYMPH NODES: No lymphadenopathy. REPRODUCTIVE ORGANS: Streak artifact limits evaluation of the pelvis; however, changes of hysterectomy are suspected. No definite adnexal mass. BONES AND SOFT TISSUES: Status post left total hip arthroplasty. Advanced degenerative changes are seen within the lumbar spine. Remote L1 superior endplate fracture. Degenerative ankylosis L5-S1. Small fat-containing umbilical hernia. No acute bone abnormality. No focal soft tissue abnormality. IMPRESSION: 1. Severe sigmoid diverticulosis with perforated diverticulitis demonstrating  progressive phlegmon in the distal sigmoid mesentery and extraluminal gas with septic thrombophlebitis involving peripheral branches of the inferior mesenteric vein. No organized drainable fluid collection. No bowel obstruction. 2. 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 is not as well visualized on the current examination Electronically signed by: Dorethia Molt MD 04/24/2024 08:29 PM EST RP Workstation: [id number]   CT ABDOMEN PELVIS W CONTRAST Result Date: 04/14/2024 EXAM: CT ABDOMEN AND PELVIS WITH CONTRAST 04/14/2024 12:16:51 PM TECHNIQUE: CT of the abdomen and pelvis was performed with the administration of 100 mL iohexol  (OMNIPAQUE ) 300 MG/ML solution. Multiplanar reformatted images are provided for review. Automated exposure control, iterative reconstruction, and/or weight-based adjustment of the mA/kV was utilized to reduce the radiation dose to as low as reasonably achievable. COMPARISON: Compared to a previous examination. CLINICAL HISTORY: Diverticulitis, complication suspected. FINDINGS: LOWER CHEST: Areas of subsegmental, platelike atelectasis are noted within both lung bases. LIVER: No suspicious liver abnormality. There is a low attenuation filling defect within the left hepatic lobe branch of the portal vein , extrahepatic portal vein, portal venous confluence, and smv concerning for thrombus. Several foci of gas appear trapped within the thrombus. GALLBLADDER AND BILE DUCTS: Gallbladder is unremarkable. No biliary ductal dilatation. SPLEEN: The spleen is within normal limits in size and appearance. PANCREAS: The pancreas is normal in size and contour without focal lesion or ductal dilatation. ADRENAL GLANDS: Normal size and morphology bilaterally. No nodule, thickening, or hemorrhage. No periadrenal stranding. KIDNEYS, URETERS AND BLADDER: Nonobstructing left renal calculi identified. The largest is in the inferior pole measuring 8 mm, image 50/2. Bilateral  Bosniak class 1 and 2 kidney cysts are identified. The largest arises off the anterior cortex of the right kidney measuring 3 cm, image 41/2. Per consensus, no follow-up is needed for simple Bosniak type 1 and 2 renal cysts, unless the patient has a malignancy history or risk factors. The left kidney is partially obscured by streak artifact from a left hip arthroplasty device. No hydronephrosis. No perinephric or periureteral stranding. Urinary bladder is unremarkable. GI AND BOWEL: Signs of acute, perforated diverticulitis involving the sigmoid colon is again noted as noted on the previous exam. There is a poorly defined gas and debris collection within the central pelvis adjacent to the sigmoid colon which tracts cranially up towards the left kidney terminating just medial to the inferior pole of the left kidney. This appears grossly unchanged compared with the previous exam. No well defined peripherally enhancing  fluid collection identified at this time to suggest a drainable abscess. The extraluminal collection of gas and debris measures 6.7 x 4.8 cm, image 62/2. Sinus tract extending from this area towards the lower pole of the left kidney measures approximately 7 to 8 cm in length. The appendix is visualized and normal in caliber, without wall thickening, periappendiceal inflammation, or fluid. Stomach demonstrates no acute abnormality. There is no bowel obstruction. PERITONEUM AND RETROPERITONEUM: No ascites. No free air. VASCULATURE: Aorta is normal in caliber. LYMPH NODES: No lymphadenopathy. REPRODUCTIVE ORGANS: No acute abnormality. BONES AND SOFT TISSUES: Left hip arthroplasty. Lumbar degenerative disc disease and facet arthropathy. No acute osseous abnormality. No focal soft tissue abnormality. IMPRESSION: 1. Acute, perforated diverticulitis involving the sigmoid colon with a poorly defined gas and debris collection in the central pelvis, grossly unchanged compared with the previous exam. No drainable  abscess identified. 2. 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 3. Several small foci of gas identified within the portal venous system 4. Results were called to Dr. Clorinda Bury at the time of interpretation on 04/14/2024 at 12:38 pm. Electronically signed by: Waddell Calk MD 04/14/2024 12:39 PM EST RP Workstation: [id number]   CT ABDOMEN PELVIS W CONTRAST Result Date: 04/10/2024 EXAM: CT ABDOMEN AND PELVIS WITH CONTRAST 04/10/2024 05:13:38 PM TECHNIQUE: CT of the abdomen and pelvis was performed with the administration of 125 mL of iohexol  (OMNIPAQUE ) 300 MG/ML solution. Multiplanar reformatted images are provided for review. Automated exposure control, iterative reconstruction, and/or weight-based adjustment of the mA/kV was utilized to reduce the radiation dose to as low as reasonably achievable. COMPARISON: CT abdomen and pelvis 03/25/2024. CLINICAL HISTORY: Diverticulitis, complication suspected. FINDINGS: LOWER CHEST: No acute abnormality. LIVER: The liver is unremarkable. GALLBLADDER AND BILE DUCTS: Gallbladder is unremarkable. No biliary ductal dilatation. SPLEEN: No acute abnormality. PANCREAS: No acute abnormality. ADRENAL GLANDS: No acute abnormality. KIDNEYS, URETERS AND BLADDER: No stones in the kidneys or ureters. No hydronephrosis. No perinephric or periureteral stranding. Urinary bladder is unremarkable. GI AND BOWEL: Persistent mid distal sigmoid diverticulitis with mild bowel thickening and pericolonic fat stranding superiorly. Microperforation is noted and best visualized on coronal images (8:111). There is no bowel obstruction. PERITONEUM AND RETROPERITONEUM: Interval development of a fluid and gas collection within the mid abdomen superior to the mid sigmoid colon (8:11). No free air. VASCULATURE: Aorta is normal in caliber. LYMPH NODES: No lymphadenopathy.  REPRODUCTIVE ORGANS: No acute abnormality. BONES AND SOFT TISSUES: No acute osseous abnormality. No focal soft tissue abnormality. Intervertebral disc space vacuum phenomenon. Grade 1 anterolisthesis of L4 on L5. Grade 1 anterolisthesis of L3 on L4. To a superior endplate concavity likely old healed fracture. IMPRESSION: 1. Acute complicated diverticulitis of the mid-distal sigmoid colon with microperforation and associated 5.4 x 6.1 x 9.2cm gas and fluid collection formation. Recommend colonoscopy status post treatment and status post complete resolution of inflammatory changes to exclude an underlying lesion. Electronically signed by: Morgane Naveau MD 04/10/2024 05:45 PM EST RP Workstation: [id number]   DG Chest Portable 1 View Result Date: 04/10/2024 EXAM: 1 VIEW(S) XRAY OF THE CHEST 04/10/2024 03:30:00 PM COMPARISON: Comparison 03/22/2024. Stable cardiomediastinal silhouette. CLINICAL HISTORY: cp cp FINDINGS: LUNGS AND PLEURA: No focal pulmonary opacity. No pleural effusion. No pneumothorax. HEART AND MEDIASTINUM: Stable cardiomediastinal silhouette. BONES AND SOFT TISSUES: No acute osseous abnormality. IMPRESSION: 1. No acute cardiopulmonary disease. Electronically signed by: Lynwood Seip MD  04/10/2024 03:39 PM EST RP Workstation: [id number]    ADDENDUM:   She is admitted with another bout of diverticulitis.  I am not sure if this is going need to be take care of surgically.  She had a CT scan which showed that she had an area of abscess collection.  There was really no mention of the portal vein thrombus..  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  There is also a lupus anticoagulant.  These can easily be the reason that she has had the portal vein thrombus.  For right now, I would just keep her on heparin .  Again, I am not sure if there is any role for intervention either with IR or with surgery.  She will definitely need long-term anticoagulation.  I just hate that she has to be admitted  because of this diverticulitis.   Jeralyn Crease, MD  [place] 12:3

## 2024-04-25 NOTE — Progress Notes (Addendum)
 PHARMACY - ANTICOAGULATION CONSULT NOTE  Pharmacy Consult for Heparin  Indication: atrial fibrillation & portal vein thrombosis  Allergies  Allergen Reactions   Bee Venom Anaphylaxis and Hives   Empagliflozin Itching, Rash and Dermatitis   Ibuprofen Nausea And Vomiting   Statins Nausea Only and Other (See Comments)    Muscle spasms and cramps, too    Patient Measurements: Height: 5' 9 (175.3 cm) Weight: 122.5 kg (270 lb) IBW/kg (Calculated) : 66.2 HEPARIN  DW (KG): 94.7  Vital Signs: Temp: 98.7 F (37.1 C) (12/03 2123) Temp Source: Oral (12/03 2123) BP: 156/81 (12/03 2000) Pulse Rate: 93 (12/03 2000)  Labs: Recent Labs    04/24/24 1444  HGB 15.1*  HCT 45.2  PLT 198  CREATININE 0.67    Estimated Creatinine Clearance: 100.8 mL/min (by C-G formula based on SCr of 0.67 mg/dL).   Medical History: Past Medical History:  Diagnosis Date   Asthma    Cancer (HCC)    Cannabis abuse    Chronic pain    Diabetes mellitus type 2, controlled (HCC)    Hypertension     Medications:  PTA apixaban  5mg  po BID - patient unable to state when last dose taken.  Assessment: 63 yr female with abdominal pain.  PMH significant for AFib, portal vein thrombosis and on Eliquis  PTA.  Recent hospitalization for sigmoid diverticulitis with microperforation and possible abscess.  Surgery consulted for possible surgical resection Eliquis  on hold and IV heparin  to begin Will monitor heparin  therapy with aPTT as heparin  levels can be falsely elevated due to recent DOAC use. Will use aPTT until effects of DOAC have worn off and aPTT and heparin  levels correlate  Goal of Therapy:  Heparin  level 0.3-0.7 units/ml aPTT 66-102 seconds Monitor platelets by anticoagulation protocol: Yes   Plan:  Obtain baseline aPTT and HL as patient unsure of last dose of apixaban  taken Begin IV heparin  @ 1600 units/hr (no bolus as patient unaware of time of last apixaban  dose- patient arrived in [facility] ED 04/24/24 @  1359 so last dose was before this time) Check aPTT 6 hr after heparin  gtt started Daily heparin  level & CBC   Arvin Gauss, PharmD 04/25/2024,12:02 AM

## 2024-04-25 NOTE — Assessment & Plan Note (Addendum)
 BP currently 158/84 Continue metoprolol  and hydralazine 

## 2024-04-25 NOTE — Progress Notes (Signed)
 PHARMACY - ANTICOAGULATION CONSULT NOTE  Pharmacy Consult for Heparin  Indication: atrial fibrillation & portal vein thrombosis  Allergies  Allergen Reactions   Bee Venom Anaphylaxis and Hives   Empagliflozin Itching, Rash and Dermatitis   Ibuprofen Nausea And Vomiting   Statins Nausea Only and Other (See Comments)    Muscle spasms and cramps, too    Patient Measurements: Height: 5' 9 (175.3 cm) Weight: 122.5 kg (270 lb) IBW/kg (Calculated) : 66.2 HEPARIN  DW (KG): 94.7  Vital Signs: Temp: 98.6 F (37 C) (12/04 0555) Temp Source: Oral (12/04 0555) BP: 157/119 (12/04 0828) Pulse Rate: 80 (12/04 0828)  Labs: Recent Labs    04/24/24 1444 04/25/24 0017 04/25/24 0516 04/25/24 0818  HGB 15.1*  --  14.4  --   HCT 45.2  --  43.8  --   PLT 198  --  160  --   APTT  --  37*  --  55*  HEPARINUNFRC  --  >1.10*  --   --   CREATININE 0.67  --  0.64  --     Estimated Creatinine Clearance: 100.8 mL/min (by C-G formula based on SCr of 0.64 mg/dL).   Medical History: Past Medical History:  Diagnosis Date   Asthma    Cancer (HCC)    Cannabis abuse    Chronic pain    Diabetes mellitus type 2, controlled (HCC)    Hypertension     Medications:  PTA apixaban  5mg  po BID - patient unable to state when last dose taken.  Assessment: 63 yr female with abdominal pain.  PMH significant for AFib, portal vein thrombosis and on Eliquis  PTA.  Recent hospitalization for sigmoid diverticulitis with microperforation and possible abscess.  Surgery consulted for possible surgical resection Eliquis  on hold and IV heparin  to begin Will monitor heparin  therapy with aPTT as heparin  levels can be falsely elevated due to recent DOAC use. Will use aPTT until effects of DOAC have worn off and aPTT and heparin  levels correlate  Today, 04/25/24 08:18 aPTT = 55, sub-therapeutic, with IV heparin  infusing at 1600 units/hr CBC WNL No bleeding, no disruption in infusion, or other heparin  related  complications per nurse  Goal of Therapy:  Heparin  level 0.3-0.7 units/ml aPTT 66-102 seconds Monitor platelets by anticoagulation protocol: Yes   Plan:  Increase IV heparin  to 1800 units/hr Check aPTT 6 hr after rate increase Monitor daily aPTT & heparin  level until correlating, CBC, signs/symptoms of bleeding    Thank you for allowing pharmacy to be a part of this patient's care.  Eleanor EMERSON Agent, PharmD, BCPS Clinical Pharmacist [place] 04/25/2024 9:51 AM

## 2024-04-25 NOTE — Assessment & Plan Note (Signed)
 Body mass index is 39.87 kg/m.SABRA  Weight loss should be encouraged Outpatient PCP/bariatric medicine f/u encouraged Significantly low or high BMI is associated with higher medical risk including morbidity and mortality

## 2024-04-25 NOTE — ED Notes (Signed)
 Trailed patient off [place] since sitting in recliner. Placed patient back on [place] due to O2 dropping again.

## 2024-04-26 ENCOUNTER — Encounter (HOSPITAL_COMMUNITY): Admission: EM | Disposition: A | Payer: Self-pay | Source: Ambulatory Visit | Attending: Internal Medicine

## 2024-04-26 ENCOUNTER — Encounter (HOSPITAL_COMMUNITY): Payer: Self-pay | Admitting: Internal Medicine

## 2024-04-26 ENCOUNTER — Inpatient Hospital Stay (HOSPITAL_COMMUNITY): Admitting: Anesthesiology

## 2024-04-26 DIAGNOSIS — K5792 Diverticulitis of intestine, part unspecified, without perforation or abscess without bleeding: Secondary | ICD-10-CM | POA: Diagnosis not present

## 2024-04-26 DIAGNOSIS — R0789 Other chest pain: Secondary | ICD-10-CM | POA: Insufficient documentation

## 2024-04-26 HISTORY — PX: PARTIAL COLECTOMY: SHX5273

## 2024-04-26 HISTORY — PX: LAPAROTOMY: SHX154

## 2024-04-26 LAB — CBC
HCT: 43.1 % (ref 36.0–46.0)
Hemoglobin: 14.1 g/dL (ref 12.0–15.0)
MCH: 31.5 pg (ref 26.0–34.0)
MCHC: 32.7 g/dL (ref 30.0–36.0)
MCV: 96.4 fL (ref 80.0–100.0)
Platelets: 184 K/uL (ref 150–400)
RBC: 4.47 MIL/uL (ref 3.87–5.11)
RDW: 13 % (ref 11.5–15.5)
WBC: 7.5 K/uL (ref 4.0–10.5)
nRBC: 0 % (ref 0.0–0.2)

## 2024-04-26 LAB — SURGICAL PCR SCREEN
MRSA, PCR: NEGATIVE
Staphylococcus aureus: NEGATIVE

## 2024-04-26 LAB — GLUCOSE, CAPILLARY
Glucose-Capillary: 112 mg/dL — ABNORMAL HIGH (ref 70–99)
Glucose-Capillary: 116 mg/dL — ABNORMAL HIGH (ref 70–99)
Glucose-Capillary: 152 mg/dL — ABNORMAL HIGH (ref 70–99)
Glucose-Capillary: 166 mg/dL — ABNORMAL HIGH (ref 70–99)
Glucose-Capillary: 166 mg/dL — ABNORMAL HIGH (ref 70–99)
Glucose-Capillary: 212 mg/dL — ABNORMAL HIGH (ref 70–99)
Glucose-Capillary: 216 mg/dL — ABNORMAL HIGH (ref 70–99)
Glucose-Capillary: 248 mg/dL — ABNORMAL HIGH (ref 70–99)

## 2024-04-26 LAB — APTT
aPTT: 103 s — ABNORMAL HIGH (ref 24–36)
aPTT: 38 s — ABNORMAL HIGH (ref 24–36)

## 2024-04-26 LAB — HEPARIN LEVEL (UNFRACTIONATED): Heparin Unfractionated: 0.89 [IU]/mL — ABNORMAL HIGH (ref 0.30–0.70)

## 2024-04-26 SURGERY — LAPAROTOMY, EXPLORATORY
Anesthesia: General

## 2024-04-26 MED ORDER — ONDANSETRON HCL 4 MG/2ML IJ SOLN
INTRAMUSCULAR | Status: DC | PRN
  Administered 2024-04-26: 4 mg via INTRAVENOUS

## 2024-04-26 MED ORDER — ONDANSETRON HCL 4 MG/2ML IJ SOLN
INTRAMUSCULAR | Status: AC
  Filled 2024-04-26: qty 2

## 2024-04-26 MED ORDER — MIDAZOLAM HCL 2 MG/2ML IJ SOLN
INTRAMUSCULAR | Status: AC
  Filled 2024-04-26: qty 2

## 2024-04-26 MED ORDER — HYDROMORPHONE HCL 1 MG/ML IJ SOLN
INTRAMUSCULAR | Status: AC
  Filled 2024-04-26: qty 1

## 2024-04-26 MED ORDER — ROCURONIUM BROMIDE 100 MG/10ML IV SOLN
INTRAVENOUS | Status: DC | PRN
  Administered 2024-04-26: 20 mg via INTRAVENOUS
  Administered 2024-04-26: 60 mg via INTRAVENOUS

## 2024-04-26 MED ORDER — ALBUMIN HUMAN 5 % IV SOLN: INTRAVENOUS | Status: DC | PRN

## 2024-04-26 MED ORDER — DEXMEDETOMIDINE HCL IN NACL 80 MCG/20ML IV SOLN
INTRAVENOUS | Status: DC | PRN
  Administered 2024-04-26 (×3): 4 ug via INTRAVENOUS

## 2024-04-26 MED ORDER — HYDROMORPHONE HCL 1 MG/ML IJ SOLN
0.2500 mg | INTRAMUSCULAR | Status: DC | PRN
  Administered 2024-04-26 (×2): 0.5 mg via INTRAVENOUS

## 2024-04-26 MED ORDER — DEXAMETHASONE SOD PHOSPHATE PF 10 MG/ML IJ SOLN
INTRAMUSCULAR | Status: DC | PRN
  Administered 2024-04-26: 10 mg via INTRAVENOUS

## 2024-04-26 MED ORDER — ONDANSETRON HCL 4 MG/2ML IJ SOLN
4.0000 mg | Freq: Four times a day (QID) | INTRAMUSCULAR | Status: DC | PRN
  Administered 2024-04-28: 4 mg via INTRAVENOUS
  Filled 2024-04-26: qty 2

## 2024-04-26 MED ORDER — PROPOFOL 500 MG/50ML IV EMUL
INTRAVENOUS | Status: DC | PRN
  Administered 2024-04-26: 30 mg via INTRAVENOUS
  Administered 2024-04-26: 170 mg via INTRAVENOUS

## 2024-04-26 MED ORDER — ORAL CARE MOUTH RINSE: 15.0000 mL | Freq: Once | OROMUCOSAL | Status: AC

## 2024-04-26 MED ORDER — LIDOCAINE HCL (PF) 2 % IJ SOLN
INTRAMUSCULAR | Status: DC | PRN
  Administered 2024-04-26: 100 mg via INTRADERMAL

## 2024-04-26 MED ORDER — INSULIN ASPART 100 UNIT/ML IJ SOLN: 0.0000 [IU] | INTRAMUSCULAR | Status: DC | PRN

## 2024-04-26 MED ORDER — ROCURONIUM BROMIDE 10 MG/ML (PF) SYRINGE
PREFILLED_SYRINGE | INTRAVENOUS | Status: AC
  Filled 2024-04-26: qty 10

## 2024-04-26 MED ORDER — HYDRALAZINE HCL 20 MG/ML IJ SOLN
5.0000 mg | Freq: Once | INTRAMUSCULAR | Status: AC
  Administered 2024-04-26: 5 mg via INTRAVENOUS

## 2024-04-26 MED ORDER — HEPARIN (PORCINE) 25000 UT/250ML-% IV SOLN
1800.0000 [IU]/h | INTRAVENOUS | Status: AC
  Administered 2024-04-26 – 2024-04-28 (×2): 1700 [IU]/h via INTRAVENOUS
  Administered 2024-04-28: 1800 [IU]/h via INTRAVENOUS
  Administered 2024-04-29 – 2024-05-02 (×5): 1600 [IU]/h via INTRAVENOUS
  Filled 2024-04-26 (×8): qty 250

## 2024-04-26 MED ORDER — LACTATED RINGERS IV SOLN: INTRAVENOUS | Status: DC | PRN

## 2024-04-26 MED ORDER — NALOXONE HCL 0.4 MG/ML IJ SOLN: 0.4000 mg | INTRAMUSCULAR | Status: DC | PRN

## 2024-04-26 MED ORDER — DEXMEDETOMIDINE HCL IN NACL 80 MCG/20ML IV SOLN
INTRAVENOUS | Status: AC
  Filled 2024-04-26: qty 20

## 2024-04-26 MED ORDER — DIPHENHYDRAMINE HCL 50 MG/ML IJ SOLN: 12.5000 mg | Freq: Four times a day (QID) | INTRAMUSCULAR | Status: DC | PRN

## 2024-04-26 MED ORDER — FENTANYL CITRATE (PF) 250 MCG/5ML IJ SOLN
INTRAMUSCULAR | Status: AC
  Filled 2024-04-26: qty 5

## 2024-04-26 MED ORDER — 0.9 % SODIUM CHLORIDE (POUR BTL) OPTIME
TOPICAL | Status: DC | PRN
  Administered 2024-04-26: 2000 mL

## 2024-04-26 MED ORDER — EPHEDRINE SULFATE (PRESSORS) 25 MG/5ML IV SOSY
PREFILLED_SYRINGE | INTRAVENOUS | Status: DC | PRN
  Administered 2024-04-26: 10 mg via INTRAVENOUS

## 2024-04-26 MED ORDER — PHENYLEPHRINE 80 MCG/ML (10ML) SYRINGE FOR IV PUSH (FOR BLOOD PRESSURE SUPPORT)
PREFILLED_SYRINGE | INTRAVENOUS | Status: AC
  Filled 2024-04-26: qty 10

## 2024-04-26 MED ORDER — KETAMINE HCL 50 MG/5ML IJ SOSY
PREFILLED_SYRINGE | INTRAMUSCULAR | Status: AC
  Filled 2024-04-26: qty 5

## 2024-04-26 MED ORDER — HYDROMORPHONE 1 MG/ML IV SOLN
INTRAVENOUS | Status: DC
  Administered 2024-04-26: 30 mg via INTRAVENOUS
  Administered 2024-04-27: 2.1 mg via INTRAVENOUS
  Administered 2024-04-27: 3.3 mg via INTRAVENOUS
  Administered 2024-04-28: 0.6 mg via INTRAVENOUS
  Administered 2024-04-28: 1.8 mg via INTRAVENOUS
  Administered 2024-04-28: 1.5 mg via INTRAVENOUS
  Administered 2024-04-28: 30 mg via INTRAVENOUS
  Filled 2024-04-26 (×3): qty 30

## 2024-04-26 MED ORDER — FAMOTIDINE IN NACL 20-0.9 MG/50ML-% IV SOLN
20.0000 mg | Freq: Once | INTRAVENOUS | Status: AC
  Administered 2024-04-26: 20 mg via INTRAVENOUS
  Filled 2024-04-26: qty 50

## 2024-04-26 MED ORDER — ALBUTEROL SULFATE HFA 108 (90 BASE) MCG/ACT IN AERS
INHALATION_SPRAY | RESPIRATORY_TRACT | Status: AC
  Filled 2024-04-26: qty 6.7

## 2024-04-26 MED ORDER — HYDRALAZINE HCL 20 MG/ML IJ SOLN
5.0000 mg | INTRAMUSCULAR | Status: DC | PRN
  Administered 2024-04-26: 20 mg via INTRAVENOUS
  Filled 2024-04-26: qty 1

## 2024-04-26 MED ORDER — SUGAMMADEX SODIUM 200 MG/2ML IV SOLN
INTRAVENOUS | Status: DC | PRN
  Administered 2024-04-26: 250 mg via INTRAVENOUS

## 2024-04-26 MED ORDER — LIDOCAINE HCL (PF) 2 % IJ SOLN
INTRAMUSCULAR | Status: AC
  Filled 2024-04-26: qty 5

## 2024-04-26 MED ORDER — ALBUMIN HUMAN 5 % IV SOLN
INTRAVENOUS | Status: AC
  Filled 2024-04-26: qty 250

## 2024-04-26 MED ORDER — LACTATED RINGERS IV SOLN: INTRAVENOUS | Status: DC

## 2024-04-26 MED ORDER — HYDRALAZINE HCL 20 MG/ML IJ SOLN
INTRAMUSCULAR | Status: AC
  Filled 2024-04-26: qty 1

## 2024-04-26 MED ORDER — MIDAZOLAM HCL (PF) 2 MG/2ML IJ SOLN
INTRAMUSCULAR | Status: DC | PRN
  Administered 2024-04-26: 2 mg via INTRAVENOUS

## 2024-04-26 MED ORDER — ALBUTEROL SULFATE HFA 108 (90 BASE) MCG/ACT IN AERS
INHALATION_SPRAY | RESPIRATORY_TRACT | Status: DC | PRN
  Administered 2024-04-26 (×2): 2 via RESPIRATORY_TRACT

## 2024-04-26 MED ORDER — EPHEDRINE 5 MG/ML INJ
INTRAVENOUS | Status: AC
  Filled 2024-04-26: qty 5

## 2024-04-26 MED ORDER — SODIUM CHLORIDE 0.9% FLUSH: 9.0000 mL | INTRAVENOUS | Status: DC | PRN

## 2024-04-26 MED ORDER — KETAMINE HCL 50 MG/5ML IJ SOSY
PREFILLED_SYRINGE | INTRAMUSCULAR | Status: DC | PRN
  Administered 2024-04-26: 10 mg via INTRAVENOUS
  Administered 2024-04-26 (×2): 20 mg via INTRAVENOUS

## 2024-04-26 MED ORDER — SUGAMMADEX SODIUM 200 MG/2ML IV SOLN
INTRAVENOUS | Status: AC
  Filled 2024-04-26: qty 4

## 2024-04-26 MED ORDER — CHLORHEXIDINE GLUCONATE 0.12 % MT SOLN
15.0000 mL | Freq: Once | OROMUCOSAL | Status: AC
  Administered 2024-04-26: 15 mL via OROMUCOSAL

## 2024-04-26 MED ORDER — FENTANYL CITRATE (PF) 250 MCG/5ML IJ SOLN
INTRAMUSCULAR | Status: DC | PRN
  Administered 2024-04-26: 100 ug via INTRAVENOUS
  Administered 2024-04-26 (×3): 50 ug via INTRAVENOUS

## 2024-04-26 MED ORDER — DIPHENHYDRAMINE HCL 12.5 MG/5ML PO ELIX: 12.5000 mg | ORAL_SOLUTION | Freq: Four times a day (QID) | ORAL | Status: DC | PRN

## 2024-04-26 MED ORDER — PROPOFOL 10 MG/ML IV BOLUS
INTRAVENOUS | Status: AC
  Filled 2024-04-26: qty 20

## 2024-04-26 SURGICAL SUPPLY — 51 items
APPLICATOR COTTON TIP 6 STRL (MISCELLANEOUS) ×2 IMPLANT
BAG COUNTER SPONGE SURGICOUNT (BAG) IMPLANT
BLADE CLIPPER SURG (BLADE) IMPLANT
BLADE EXTENDED COATED 6.5IN (ELECTRODE) IMPLANT
BLADE HEX COATED 2.75 (ELECTRODE) ×1 IMPLANT
BLADE SURG SZ10 CARB STEEL (BLADE) IMPLANT
BNDG GAUZE DERMACEA FLUFF 4 (GAUZE/BANDAGES/DRESSINGS) IMPLANT
CHLORAPREP W/TINT 26 (MISCELLANEOUS) ×1 IMPLANT
CLIP TI LARGE 6 (CLIP) IMPLANT
COVER MAYO STAND STRL (DRAPES) ×1 IMPLANT
DERMABOND ADVANCED .7 DNX12 (GAUZE/BANDAGES/DRESSINGS) ×2 IMPLANT
DRAPE LAPAROSCOPIC ABDOMINAL (DRAPES) ×2 IMPLANT
DRAPE SHEET LG 3/4 BI-LAMINATE (DRAPES) IMPLANT
DRAPE WARM FLUID 44X44 (DRAPES) ×2 IMPLANT
ELECT REM PT RETURN 15FT ADLT (MISCELLANEOUS) ×2 IMPLANT
GAUZE PAD ABD 8X10 STRL (GAUZE/BANDAGES/DRESSINGS) IMPLANT
GAUZE SPONGE 4X4 12PLY STRL (GAUZE/BANDAGES/DRESSINGS) ×2 IMPLANT
GLOVE BIO SURGEON STRL SZ7.5 (GLOVE) ×3 IMPLANT
GLOVE BIOGEL PI IND STRL 7.0 (GLOVE) ×1 IMPLANT
GOWN STRL REUS W/ TWL XL LVL3 (GOWN DISPOSABLE) ×2 IMPLANT
HANDLE SUCTION POOLE (INSTRUMENTS) ×2 IMPLANT
KIT BASIN OR (CUSTOM PROCEDURE TRAY) ×2 IMPLANT
KIT TURNOVER KIT A (KITS) ×2 IMPLANT
LEGGING LITHOTOMY PAIR STRL (DRAPES) IMPLANT
LIGASURE IMPACT 36 18CM CVD LR (INSTRUMENTS) IMPLANT
NS IRRIG 1000ML POUR BTL (IV SOLUTION) ×4 IMPLANT
PACK GENERAL/GYN (CUSTOM PROCEDURE TRAY) ×2 IMPLANT
PROTECTOR NERVE ULNAR (MISCELLANEOUS) ×2 IMPLANT
SHEARS HARMONIC 36 ACE (MISCELLANEOUS) IMPLANT
SPONGE T-LAP 18X18 ~~LOC~~+RFID (SPONGE) IMPLANT
STAPLER CVD CUT BLU 40 RELOAD (ENDOMECHANICALS) IMPLANT
STAPLER PROXIMATE 75MM BLUE (STAPLE) IMPLANT
STAPLER SKIN PROX 35W (STAPLE) ×2 IMPLANT
SUT NOV 1 T60/GS (SUTURE) IMPLANT
SUT NOVA NAB DX-16 0-1 5-0 T12 (SUTURE) IMPLANT
SUT NOVA T20/GS 25 (SUTURE) IMPLANT
SUT PDS AB 1 TP1 96 (SUTURE) ×4 IMPLANT
SUT PROLENE 0 CT 1 30 (SUTURE) IMPLANT
SUT PROLENE 2 0 SH DA (SUTURE) IMPLANT
SUT SILK 2 0 SH CR/8 (SUTURE) ×2 IMPLANT
SUT SILK 2 0SH CR/8 30 (SUTURE) IMPLANT
SUT SILK 2-0 18XBRD TIE 12 (SUTURE) ×1 IMPLANT
SUT SILK 2-0 30XBRD TIE 12 (SUTURE) ×1 IMPLANT
SUT SILK 3 0 12 30 (SUTURE) ×1 IMPLANT
SUT SILK 3 0 SH CR/8 (SUTURE) ×2 IMPLANT
SUT SILK 3-0 18XBRD TIE 12 (SUTURE) ×2 IMPLANT
SUT VIC AB 3-0 SH 18 (SUTURE) IMPLANT
TOWEL OR DSP ST BLU DLX 10/PK (DISPOSABLE) ×4 IMPLANT
TRAY FOLEY MTR SLVR 16FR STAT (SET/KITS/TRAYS/PACK) ×1 IMPLANT
WATER STERILE IRR 1000ML POUR (IV SOLUTION) IMPLANT
YANKAUER SUCT BULB TIP NO VENT (SUCTIONS) ×1 IMPLANT

## 2024-04-26 NOTE — Assessment & Plan Note (Signed)
 CT11/23 showed portal venous thrombosis  Patient was discharged on Eliquis  Hold Eliquis  for now and start IV heparin  for anticoagulation

## 2024-04-26 NOTE — Plan of Care (Signed)

## 2024-04-26 NOTE — Plan of Care (Signed)
 IVF initiating NPO since MN Consent signed   Problem: Coping: Goal: Ability to adjust to condition or change in health will improve Outcome: Progressing   Problem: Fluid Volume: Goal: Ability to maintain a balanced intake and output will improve Outcome: Progressing

## 2024-04-26 NOTE — Assessment & Plan Note (Signed)
 BP currently 158/84 Continue metoprolol  and hydralazine 

## 2024-04-26 NOTE — Assessment & Plan Note (Addendum)
 2 recent hospital admissions for this problem, treated with antibiotics Repeat CT now shows severe sigmoid diverticulosis with perforated diverticulitis demonstrating progressive phlegmon in the distal sigmoid mesentery and extraluminal gas with septic thrombophlebitis involving peripheral branches of the inferior mesenteric vein WBC count 10.5, no signs of sepsis General Surgery consulted Continue Zosyn  Underwent ex lap with sigmoid colectomy with ostomy creation 12/5 Pain control with Oxy, Dilaudid ; anxiety control with Ativan 

## 2024-04-26 NOTE — Assessment & Plan Note (Signed)
 Patient was not hypoxic on arrival to the ED but later desatted to 87% on room air after receiving IV opiates for pain Currently satting well on RA  Echo done 03/22/2024 showing EF 55 to 60% and grade 1 diastolic dysfunction; no signs of volume overload at this time

## 2024-04-26 NOTE — Progress Notes (Signed)
 Progress Note   Patient: Lisa Ibarra FMW:[id number] DOB: 12/09/1960 DOA: 04/24/2024     2 DOS: the patient was seen and examined on 04/26/2024   Brief hospital course: 63yo with h/o COPD, chronic HFpEF, HTN, HLD, T2DM, chronic pain syndrome, class 2 obesity, and tobacco and marijuana use who presented on 12/3 with abdominal pain. She was previously admitted from 11/3-7 for acute diverticulitis with bacteremia with Pseudomonas and E coli. She returned from 11/19-26 with diverticulitis with microperforation and possible abscess that was treated with antibiotics; she was also found to have a portal vein thrombosis and started on Eliquis . She returned to the ER due to persistent abdominal pain, having run out of antibiotics 3 days PTA. 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 She was treated with Dilaudid , Zofran , and Zosyn  and surgery was consulted. Ex lap on 12/5 for perforated diverticulitis s/p ostomy creation.    Assessment & Plan Acute diverticulitis 2 recent hospital admissions for this problem, treated with antibiotics Repeat CT now shows severe sigmoid diverticulosis with perforated diverticulitis demonstrating progressive phlegmon in the distal sigmoid mesentery and extraluminal gas with septic thrombophlebitis involving peripheral branches of the inferior mesenteric vein WBC count 10.5, no signs of sepsis General Surgery consulted Continue Zosyn  Underwent ex lap with sigmoid colectomy with ostomy creation 12/5 Pain control with Oxy, Dilaudid ; anxiety control with Ativan  Non-cardiac chest pain Reported chest pain this AM prior to procedure Reports that this pain is usually present when she is hungry and resolves with drinking water Given IV Pepcid  Negative EKG Cleared for surgery Chronic heart failure with preserved  ejection fraction (HFpEF) (HCC) Hypoxemia COPD (chronic obstructive pulmonary disease) (HCC) Patient was not hypoxic on arrival to the ED but later desatted to 87% on room air after receiving IV opiates for pain Currently satting well on RA  Echo done 03/22/2024 showing EF 55 to 60% and grade 1 diastolic dysfunction; no signs of volume overload at this time Diabetes mellitus (HCC) Hemoglobin A1c 7.0, good control Placed on sensitive sliding scale insulin  every 4 hours 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  Patient was discharged on Eliquis  Hold Eliquis  for now and start IV heparin  for anticoagulation Essential hypertension BP currently 158/84 Continue metoprolol  and hydralazine  Class 2 obesity due to excess calories with body mass index (BMI) of 39.0 to 39.9 in adult Body mass index is 39.87 kg/m.SABRA  Weight loss should be encouraged Outpatient PCP/bariatric medicine f/u encouraged Significantly low or high BMI is associated with higher medical risk including morbidity and mortality       Consultants: Surgery Oncology   Procedures: Ex lap with ostomy creation 12/5   Antibiotics: Zosyn  12/3-  30 Day Unplanned Readmission Risk Score    Flowsheet Row ED to Hosp-Admission (Current) from 04/24/2024 in [place] 4TH FLOOR PROGRESSIVE CARE AND UROLOGY  30 Day Unplanned Readmission Risk Score (%) 24.33 Filed at 04/26/2024 0802    This score is the patient's risk of an unplanned readmission within 30 days of being discharged (0 -100%). The score is based on dignosis, age, lab data, medications, orders, and past utilization.   Low:  0-14.9   Medium: 15-21.9   High: 22-29.9   Extreme: 30 and above           Subjective: Seen in PACU, sleeping with occasional moaning.   Objective: Vitals:   04/26/24 1610 04/26/24 1628  BP: (!) 182/84 (!) 184/88  Pulse: 82   Resp:    Temp: 98.4 F (36.9 C)   SpO2: 99%     Intake/Output Summary (Last 24 hours)  at 04/26/2024 1643 Last data filed at 04/26/2024 1515 Gross per 24 hour  Intake 1550 ml  Output 1150 ml  Net 400 ml   Filed Weights   04/24/24 1426  Weight: 122.5 kg    Exam:  General:  Appears calm and comfortable and is in NAD other than occasional moaning when bothered Eyes:  normal lids, closed throughout ENT:  grossly normal hearing, lips & tongue, mmm Cardiovascular:  RRR. No LE edema.  Respiratory:   CTA bilaterally with no wheezes/rales/rhonchi.  Normal respiratory effort. Abdomen:  large abdominal incision with dressing, reportedly left open, ostomy in place with scant bloody drainage within the pouch Skin:  no rash or induration seen on limited exam Musculoskeletal:   no bony abnormality Psychiatric:  sedated Neurologic:  unable to effectively perform  Data Reviewed: I have reviewed the patient's lab results since admission.  Pertinent labs for today include:   Normal CBC     Family Communication: None present; surgery communicated with family post-procedure     Code Status: Full Code   Disposition: Status is: Inpatient Remains inpatient appropriate because: immediately post-operative     Time spent: 50 minutes  Unresulted Labs (From admission, onward)     Start     Ordered   04/28/24 0500  Heparin  level (unfractionated)  Daily,   R      04/26/24 1450   04/27/24 0500  Basic metabolic panel with GFR  Tomorrow morning,   R        04/26/24 0818   04/27/24 0400  Heparin  level (unfractionated)  Once-Timed,   TIMED        04/26/24 1449   04/27/24 0400  APTT  Once-Timed,   TIMED        04/26/24 1449   04/26/24 0930  CBC  Daily,   R      04/26/24 0138   Signed and Held  Comprehensive metabolic panel  Daily,   R      Signed and Held             Author: Delon Herald, MD 04/26/2024 4:43 PM  For on call review [email or web address].

## 2024-04-26 NOTE — Hospital Course (Addendum)
 Brief Narrative:   63yo with h/o COPD, chronic HFpEF, HTN, HLD, T2DM, chronic pain syndrome, class 2 obesity, and tobacco and marijuana use who presented on 12/3 with abdominal pain. She was previously admitted from 11/3-7 for acute diverticulitis with bacteremia with Pseudomonas and E coli. She returned from 11/19-26 with diverticulitis with microperforation and possible abscess that was treated with antibiotics; she was also found to have a portal vein thrombosis and started on Eliquis . She returned to the ER due to persistent abdominal pain, having run out of antibiotics 3 days PTA. 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 She was treated with Dilaudid , Zofran , and Zosyn  and surgery was consulted. Ex lap on 12/5 for perforated diverticulitis s/p ostomy creation.   Assessment & Plan:  Acute diverticulitis, complicated by perforation Status post ostomy 12/5 Unfortunately patient had developed perforation and therefore general surgery performed ex lap with sigmoid colectomy with ostomy creation on 12/5.  Postop management per their service and appears to be slowly improving  Mood disorder Reported SI 12/7, but contracts for safety.  Suspected situational in nature.  Seen by psychiatry and has been cleared  Non-cardiac chest pain PPI, mouthwash and Pepcid  was given  Chronic heart failure with preserved ejection fraction (HFpEF) (HCC), 60% Hypoxemia COPD (chronic obstructive pulmonary disease) (HCC) Echo done 03/22/2024 showing EF 55 to 60% and grade 1 diastolic dysfunction; no signs of volume overload at this time Overall compensated As needed bronchodilators  Diabetes mellitus (HCC) Hemoglobin A1c 7.0, good control Sliding scale and Accu-Cheks  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  Patient is now  back on home Eliquis   Essential hypertension Continue home Norvasc , losartan , Toprol  IV as needed  Dyslipidemia Resume atorvastatin  (although it is not clear that she was taking this as prescribed PTA)  Class 2 obesity due to excess calories with body mass index (BMI) of 39.0 to 39.9 in adult Body mass index is 39.87 kg/m.SABRA  Weight loss should be encouraged Outpatient PCP/bariatric medicine f/u encouraged Significantly low or high BMI is associated with higher medical risk including morbidity and mortality      DVT prophylaxis: Eliquis     Code Status: Full Code Family Communication:   Status is: Inpatient Remains inpatient appropriate because: Continue hospital stay until cleared by general surgery   PT Follow up Recs: Home Health Pt12/11/2023 1458  Subjective: Seen at bedside, still having some pain and difficulty. Would like me to increase her gabapentin  to help more with her pain   Examination:  General exam: Appears calm and comfortable  Respiratory system: Clear to auscultation. Respiratory effort normal. Cardiovascular system: S1 & S2 heard, RRR. No JVD, murmurs, rubs, gallops or clicks. No pedal edema. Gastrointestinal system: Abdomen is nondistended, soft and nontender. No organomegaly or masses felt. Normal bowel sounds heard. Central nervous system: Alert and oriented. No focal neurological deficits. Extremities: Symmetric 5 x 5 power. Skin: Surgical site with ostomy noted.  Midline incision, dressing in place Psychiatry: Judgement and insight appear normal. Mood & affect appropriate.

## 2024-04-26 NOTE — Assessment & Plan Note (Signed)
 Hemoglobin A1c 7.0, good control Placed on sensitive sliding scale insulin  every 4 hours

## 2024-04-26 NOTE — Transfer of Care (Signed)
 Immediate Anesthesia Transfer of Care Note  Patient: Lisa Ibarra  Procedure(s) Performed: LAPAROTOMY, EXPLORATORY COLECTOMY, PARTIAL, CREATION COLOSTOMY, SIGMOID COLECTOMY  Patient Location: PACU  Anesthesia Type:General  Level of Consciousness: awake and alert   Airway & Oxygen Therapy: Patient Spontanous Breathing and Patient connected to face mask oxygen  Post-op Assessment: Report given to RN and Post -op Vital signs reviewed and stable  Post vital signs: Reviewed and stable  Last Vitals:  Vitals Value Taken Time  BP 164/86 04/26/24 14:15  Temp 36.4 C 04/26/24 14:10  Pulse 72 04/26/24 14:24  Resp 20 04/26/24 14:24  SpO2 92 % 04/26/24 14:24  Vitals shown include unfiled device data.  Last Pain:  Vitals:   04/26/24 1015  TempSrc: Oral  PainSc: 10-Worst pain ever      Patients Stated Pain Goal: 3 (04/25/24 2031)  Complications: No notable events documented.

## 2024-04-26 NOTE — Op Note (Addendum)
 04/26/2024  1:43 PM  PATIENT:  Lisa Ibarra  63 y.o. female  PRE-OPERATIVE DIAGNOSIS:  PERFORATED DIVERTICULITIS  POST-OPERATIVE DIAGNOSIS:  PERFORATED DIVERTICULITIS  PROCEDURE:  Procedure(s) with comments: LAPAROTOMY, EXPLORATORY (N/A) SIGMOID COLECTOMY, CREATION COLOSTOMY,  (N/A) - COLOSTOMY  SURGEON:  Surgeons and Role:    DEWAINE Rubin Calamity, MD - Primary  PHYSICIAN ASSISTANT: Marjorie Favre, PA-C   ANESTHESIA:   local and general  EBL:  250 mL   BLOOD ADMINISTERED:none  DRAINS: none   LOCAL MEDICATIONS USED:  NONE  SPECIMEN:  Source of Specimen:  sigmoid colon with abscess and perforation  DISPOSITION OF SPECIMEN:  PATHOLOGY  COUNTS:  YES  TOURNIQUET:  * No tourniquets in log *  DICTATION: .Dragon Dictation   Indication procedure: Patient is a 63 year old female, with history of multiple episodes of diverticulitis.  Patient came in secondary to abdominal pain.  She underwent CT scan was found to have perforated diverticulitis with abscess.  Secondary to pain and ongoing leukocytosis patient was taken back to the OR for ex lap and sigmoid colon resection.  Findings: Patient with a large perirectal abscess within the mesentery of the rectum.  Patient underwent sigmoid colectomy with end colostomy.  The rectal staple line was tagged with 0 Prolene x 2.  And colostomy was brought out via the left lower quadrant area and matured.  Skin was left open.  Details of procedure: As the patient was consented patient takeback to the OR placed supine position with bilateral SCDs placed.  Patient under general endotracheal anesthesia.  Patient was then prepped draped sterile fashion.  A timeout was called all facts verified.  A #10 blade was used to make a midline incision.  Dissection was taken down to the linea alba.  This was incised with cautery to maintain hemostasis.  The linea alba was raised the peritoneum was entered bluntly.  At this time I incised the fascia to length  the skin incision.  At this time Bookwalter retractor was placed to help retract the abdominal wall.  The intestines were also retracted using a Bookwalter retractor.  At this time it was.  There was a large phlegmon area in the pelvis.  I was able to incise the left white line of Toldt.  This allowed me to medialize the left colon.  Upon doing so I was able to create a mesenteric window.  75 GIA stapler was then used to transect the descending colon.  This allowed me to pull up on the sigmoid colon.  In doing so I was able to use a LigaSure device to ligate the mesentery.  I stayed close with to the colon and worked my way inferiorly.  The dissection was taken down to the sacral promontory.  At this time it was evident there was a large posterior rectal mesentery abscess this was firm and woody.  I was able to finger fracture this.  I was able to finger fracture circumferentially around the colon.  I continued to ligate the mesentery with LigaSure.  I worked my way down inferior to the abscess and the rectum.  This was inferior to the sacral promontory.  Upon doing so in while getting to normal colon I was able to place a blue load contour device.  This was used to transect the rectum.  The specimen was then sent to pathology.  The staple lines of the rectum were then tagged with 0 Prolene's x 2.  At this time the pelvis was irrigated out with  sterile saline.  I was able to medialize the left colon more to help with this up as an ostomy.  The previous ostomy site that was marked was chosen for the ostomy site.  Skin was pulled up with a Kocher clamp.  The skin was incised in a circular fashion.  The subcutaneous tissue was debulked.  I took my dissection down to the anterior fascia.  This was incised in a cruciate fashion as was the posterior rectus fascia.  I was able to place 2 fingerbreadths within the ostomy site.  At this time I was able to bring the staple line up through the ostomy site.  I was entering the  omentum over the midline space.  The fascia was then reapproximated using #1 PDS single-stranded in a standard running fashion.  The skin was left open and packed.  At this time I matured the ostomy after removing the staple line with cautery.  This was done using 3-0 Vicryl  in interrupted fashion.  An ostomy device was placed.  Midline fascia was packed with a saline soaked Kerlix.  Patient tolerated procedure well was taken to the recovery in stable condition.  CASE DATA: Type of patient?: LDOW CASE (Surgical Hospitalist [facility] Inpatient) Status of Case? URGENT Add On Infection Present At Time Of Surgery (PATOS)?  ABSCESS    PLAN OF CARE: Admit to inpatient   PATIENT DISPOSITION:  PACU - hemodynamically stable.   Delay start of Pharmacological VTE agent (>24hrs) due to surgical blood loss or risk of bleeding: yes

## 2024-04-26 NOTE — Assessment & Plan Note (Signed)
 Reported chest pain this AM prior to procedure Reports that this pain is usually present when she is hungry and resolves with drinking water Given IV Pepcid  Negative EKG Cleared for surgery

## 2024-04-26 NOTE — Progress Notes (Signed)
 * Day of Surgery *   Subjective/Chief Complaint: Pt doing well this AM Still with some abd pain   Objective: Vital signs in last 24 hours: Temp:  [98.2 F (36.8 C)-99.7 F (37.6 C)] 98.6 F (37 C) (12/05 1015) Pulse Rate:  [66-79] 76 (12/05 1015) Resp:  [17-20] 19 (12/05 0301) BP: (142-179)/(81-95) 179/95 (12/05 1015) SpO2:  [91 %-99 %] 91 % (12/05 1015) FiO2 (%):  [21 %] 21 % (12/05 0742) Last BM Date : 04/25/24  Intake/Output from previous day: 12/04 0701 - 12/05 0700 In: 329.8 [I.V.:243.5; IV Piggyback:86.3] Out: -  Intake/Output this shift: No intake/output data recorded.  PE:  Constitutional: No acute distress, conversant, appears states age. Eyes: Anicteric sclerae, moist conjunctiva, no lid lag Lungs: Clear to auscultation bilaterally, normal respiratory effort CV: regular rate and rhythm, no murmurs, no peripheral edema, pedal pulses 2+ GI: Soft, no masses or hepatosplenomegaly, tender to palpation LLQ Skin: No rashes, palpation reveals normal turgor Psychiatric: appropriate judgment and insight, oriented to person, place, and time   Lab Results:  Recent Labs    04/25/24 0516 04/26/24 0930  WBC 8.0 7.5  HGB 14.4 14.1  HCT 43.8 43.1  PLT 160 184   BMET Recent Labs    04/24/24 1444 04/25/24 0516  NA 136 136  K 3.6 3.6  CL 101 102  CO2 25 26  GLUCOSE 169* 171*  BUN 7* 5*  CREATININE 0.67 0.64  CALCIUM  9.4 8.8*   PT/INR No results for input(s): LABPROT, INR in the last 72 hours. ABG No results for input(s): PHART, HCO3 in the last 72 hours.  Invalid input(s): PCO2, PO2  Studies/Results: CT ABDOMEN PELVIS W CONTRAST Result Date: 04/24/2024 EXAM: CT ABDOMEN AND PELVIS WITH CONTRAST 04/24/2024 07:02:00 PM TECHNIQUE: CT of the abdomen and pelvis was performed with the administration of 100 mL iohexol  (OMNIPAQUE ) 300 MG/ML solution. Multiplanar reformatted images are provided for review. Automated exposure control, iterative  reconstruction, and/or weight-based adjustment of the mA/kV was utilized to reduce the radiation dose to as low as reasonably achievable. COMPARISON: Chest CT examination of 08/18/2021. CLINICAL HISTORY: Abdominal pain, acute, nonlocalized; Recent diverticular abscess, worsening sx. FINDINGS: LOWER CHEST: No acute abnormality. LIVER: The liver is unremarkable. 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 The vessel remains patent. GALLBLADDER AND BILE DUCTS: Gallbladder is unremarkable. No biliary ductal dilatation. SPLEEN: No acute abnormality. PANCREAS: No acute abnormality. ADRENAL GLANDS: 13 mm right adrenal nodule is indeterminate on the current examination, but was better assessed on chest CT examination of 08/18/2021 where this was compatible with a benign adrenal adenoma. This is unchanged in size and further follow-up is not required. The left adrenal gland is unremarkable. KIDNEYS, URETERS AND BLADDER: Kidneys are normal in size and position. Multiple punctate nonobstructing calculi are seen within the left kidney with a dominant 9 mm calculus within the lower pole of the left kidney. No hydronephrosis. No ureteral calculi. A subcortical cyst is seen within the right kidney, for which no follow-up imaging is recommended. The bladder is partially obscured by streak artifact; however, the visualized portion is unremarkable. GI AND BOWEL: Severe sigmoid diverticulosis. Changes of perforated diverticulitis again noted. Extensive inflammatory tissue within the distal sigmoid mesentery with extraluminal gas is again identified and has progressed in terms of density and overall size with the phlegmonous collection now measuring at least 5.4 x 7.8 x 5.5 cm in overall size. Extension of extraluminal gas  into the inferior mesenteric vein peripherally again noted in keeping with changes of septic thrombophlebitis. No organized,  drainable fluid collection identified. The inflammatory process encases the inferior mesenteric artery distally (5 1/2), however, no active extravasation is identified. Circumferential wall thickening involving the adjacent distal sigmoid colon is again noted, however, there is no evidence of obstruction. The stomach, small bowel, and large bowel are otherwise unremarkable. The appendix is normal. PERITONEUM AND RETROPERITONEUM: No gross free intraperitoneal gas or fluid. VASCULATURE: Aorta is normal in caliber. Mild aortoiliac atherosclerotic calcification. No aortic aneurysm. LYMPH NODES: No lymphadenopathy. REPRODUCTIVE ORGANS: Streak artifact limits evaluation of the pelvis; however, changes of hysterectomy are suspected. No definite adnexal mass. BONES AND SOFT TISSUES: Status post left total hip arthroplasty. Advanced degenerative changes are seen within the lumbar spine. Remote L1 superior endplate fracture. Degenerative ankylosis L5-S1. Small fat-containing umbilical hernia. No acute bone abnormality. No focal soft tissue abnormality. IMPRESSION: 1. Severe sigmoid diverticulosis with perforated diverticulitis demonstrating progressive phlegmon in the distal sigmoid mesentery and extraluminal gas with septic thrombophlebitis involving peripheral branches of the inferior mesenteric vein. No organized drainable fluid collection. No bowel obstruction. 2. 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 is not as well visualized on the current examination Electronically signed by: Dorethia Molt MD 04/24/2024 08:29 PM EST RP Workstation: [id number]    Anti-infectives: Anti-infectives (From admission, onward)    Start     Dose/Rate Route Frequency Ordered Stop   04/25/24 0300  [MAR Hold]  piperacillin -tazobactam (ZOSYN ) IVPB 3.375 g        (MAR Hold since Fri 04/26/2024 at 1007.Hold Reason: Transfer to a Procedural area)   3.375 g 12.5 mL/hr over 240 Minutes Intravenous Every 8  hours 04/24/24 2338     04/24/24 2100  piperacillin -tazobactam (ZOSYN ) IVPB 3.375 g        3.375 g 100 mL/hr over 30 Minutes Intravenous  Once 04/24/24 2053 04/24/24 2142       Assessment/Plan: 47F with perf diverticulitis - to OR for ex lap and colon resection and colostomy - I d/w her the risks and benefits of the procedure: infection, bleeding, damage to surround structures, possible need for further surgery.  Pt voiced understanding and wishes to proceed.    LOS: 2 days    Lynda Leos 04/26/2024

## 2024-04-26 NOTE — Progress Notes (Signed)
 PHARMACY - ANTICOAGULATION CONSULT NOTE  Pharmacy Consult for Heparin  Indication: atrial fibrillation & portal vein thrombosis  Allergies  Allergen Reactions   Bee Venom Anaphylaxis and Hives   Empagliflozin Itching, Rash and Dermatitis   Ibuprofen Nausea And Vomiting   Statins Nausea Only and Other (See Comments)    Muscle spasms and cramps, too    Patient Measurements: Height: 5' 9 (175.3 cm) Weight: 122.5 kg (270 lb) IBW/kg (Calculated) : 66.2 HEPARIN  DW (KG): 94.7  Vital Signs: Temp: 97.5 F (36.4 C) (12/05 1410) Temp Source: Oral (12/05 1015) BP: 177/99 (12/05 1430) Pulse Rate: 73 (12/05 1430)  Labs: Recent Labs    04/24/24 1444 04/25/24 0017 04/25/24 0516 04/25/24 0818 04/25/24 1658 04/25/24 2239 04/26/24 0930  HGB 15.1*  --  14.4  --   --   --  14.1  HCT 45.2  --  43.8  --   --   --  43.1  PLT 198  --  160  --   --   --  184  APTT  --  37*  --    < > 85* 103* 38*  HEPARINUNFRC  --  >1.10*  --   --   --   --  0.89*  CREATININE 0.67  --  0.64  --   --   --   --    < > = values in this interval not displayed.    Estimated Creatinine Clearance: 100.8 mL/min (by C-G formula based on SCr of 0.64 mg/dL).   Medical History: Past Medical History:  Diagnosis Date   Asthma    Cancer (HCC)    Cannabis abuse    Chronic pain    Diabetes mellitus type 2, controlled (HCC)    Hypertension     Medications:  PTA apixaban  5mg  po BID - patient unable to state when last dose taken.  Assessment: 63 yr female with abdominal pain.  PMH significant for AFib, portal vein thrombosis and on Eliquis  PTA.  Recent hospitalization for sigmoid diverticulitis with microperforation and possible abscess.  Surgery consulted for possible surgical resection Eliquis  on hold and IV heparin  to begin Will monitor heparin  therapy with aPTT as heparin  levels can be falsely elevated due to recent DOAC use. Will use aPTT until effects of DOAC have worn off and aPTT and heparin  levels  correlate 04/26/24  aPTT and HL this AM were drawn after heparin  drip was stopped prior to procedure this AM  CBC WNL No bleeding, no disruption in infusion, or other heparin  related complications per nurse Discussed with Dr. Rubin, ok to resume heparin  drip tonight at 2000 PM with no bolus   Goal of Therapy:  Heparin  level 0.3-0.7 units/ml aPTT 66-102 seconds Monitor platelets by anticoagulation protocol: Yes   Plan:  Resume heparin  1700 units/hr at 2000 tonight Check aptt/HL 8 hours after restart  Monitor daily aPTT & heparin  level until correlating Daily CBC, monitor signs/symptoms of bleeding    Dolphus Roller, PharmD, BCPS 04/26/2024 2:44 PM

## 2024-04-26 NOTE — Assessment & Plan Note (Addendum)
 Body mass index is 39.87 kg/m.SABRA  Weight loss should be encouraged Outpatient PCP/bariatric medicine f/u encouraged Significantly low or high BMI is associated with higher medical risk including morbidity and mortality

## 2024-04-26 NOTE — Anesthesia Preprocedure Evaluation (Addendum)
 Anesthesia Evaluation  Patient identified by MRN, date of birth, ID band Patient awake    Reviewed: Allergy & Precautions, H&P , NPO status , Patient's Chart, lab work & pertinent test results, reviewed documented beta blocker date and time   Airway Mallampati: II  TM Distance: >3 FB Neck ROM: Full    Dental no notable dental hx. (+) Partial Upper, Dental Advisory Given   Pulmonary asthma , COPD,  COPD inhaler, Current Smoker and Patient abstained from smoking.   Pulmonary exam normal breath sounds clear to auscultation       Cardiovascular hypertension, Pt. on medications and Pt. on home beta blockers  Rhythm:Regular Rate:Normal     Neuro/Psych  Headaches  negative psych ROS   GI/Hepatic negative GI ROS, Neg liver ROS,,,  Endo/Other  diabetes, Type 2, Insulin  Dependent, Oral Hypoglycemic Agents  Class 3 obesity  Renal/GU negative Renal ROS  negative genitourinary   Musculoskeletal   Abdominal   Peds  Hematology negative hematology ROS (+)   Anesthesia Other Findings   Reproductive/Obstetrics negative OB ROS                              Anesthesia Physical Anesthesia Plan  ASA: 3  Anesthesia Plan: General   Post-op Pain Management: Ofirmev  IV (intra-op)*   Induction: Intravenous  PONV Risk Score and Plan: 3 and Ondansetron , Dexamethasone  and Midazolam   Airway Management Planned: Oral ETT  Additional Equipment:   Intra-op Plan:   Post-operative Plan: Extubation in OR  Informed Consent: I have reviewed the patients History and Physical, chart, labs and discussed the procedure including the risks, benefits and alternatives for the proposed anesthesia with the patient or authorized representative who has indicated his/her understanding and acceptance.     Dental advisory given  Plan Discussed with: CRNA  Anesthesia Plan Comments:          Anesthesia Quick  Evaluation

## 2024-04-26 NOTE — Progress Notes (Signed)
 PHARMACY - ANTICOAGULATION CONSULT NOTE  Pharmacy Consult for Heparin  Indication: atrial fibrillation & portal vein thrombosis  Allergies  Allergen Reactions   Bee Venom Anaphylaxis and Hives   Empagliflozin Itching, Rash and Dermatitis   Ibuprofen Nausea And Vomiting   Statins Nausea Only and Other (See Comments)    Muscle spasms and cramps, too    Patient Measurements: Height: 5' 9 (175.3 cm) Weight: 122.5 kg (270 lb) IBW/kg (Calculated) : 66.2 HEPARIN  DW (KG): 94.7  Vital Signs: Temp: 98.4 F (36.9 C) (12/04 2116) Temp Source: Oral (12/04 2116) BP: 165/92 (12/04 2120) Pulse Rate: 68 (12/04 2116)  Labs: Recent Labs    04/24/24 1444 04/25/24 0017 04/25/24 0017 04/25/24 0516 04/25/24 0818 04/25/24 1658 04/25/24 2239  HGB 15.1*  --   --  14.4  --   --   --   HCT 45.2  --   --  43.8  --   --   --   PLT 198  --   --  160  --   --   --   APTT  --  37*   < >  --  55* 85* 103*  HEPARINUNFRC  --  >1.10*  --   --   --   --   --   CREATININE 0.67  --   --  0.64  --   --   --    < > = values in this interval not displayed.    Estimated Creatinine Clearance: 100.8 mL/min (by C-G formula based on SCr of 0.64 mg/dL).   Medical History: Past Medical History:  Diagnosis Date   Asthma    Cancer (HCC)    Cannabis abuse    Chronic pain    Diabetes mellitus type 2, controlled (HCC)    Hypertension     Medications:  PTA apixaban  5mg  po BID - patient unable to state when last dose taken.  Assessment: 63 yr female with abdominal pain.  PMH significant for AFib, portal vein thrombosis and on Eliquis  PTA.  Recent hospitalization for sigmoid diverticulitis with microperforation and possible abscess.  Surgery consulted for possible surgical resection Eliquis  on hold and IV heparin  to begin Will monitor heparin  therapy with aPTT as heparin  levels can be falsely elevated due to recent DOAC use. Will use aPTT until effects of DOAC have worn off and aPTT and heparin  levels  correlate 04/26/24  aPTT 103 sec- now slightly above goal range on IV heparin  1800 units/hr CBC WNL No bleeding, no disruption in infusion, or other heparin  related complications per nurse  Goal of Therapy:  Heparin  level 0.3-0.7 units/ml aPTT 66-102 seconds Monitor platelets by anticoagulation protocol: Yes   Plan:  Decrease heparin  IV rate to 1700 units/hr Check aptt in 8h due to obesity Monitor daily aPTT & heparin  level until correlating Daily CBC, monitor signs/symptoms of bleeding   Rosaline Millet, PharmD, BCPS 04/26/2024 12:25 AM

## 2024-04-26 NOTE — Anesthesia Procedure Notes (Signed)
 Procedure Name: Intubation Date/Time: 04/26/2024 11:59 AM  Performed by: Obadiah Reyes BROCKS, CRNAPre-anesthesia Checklist: Patient identified, Emergency Drugs available, Suction available and Patient being monitored Patient Re-evaluated:Patient Re-evaluated prior to induction Oxygen Delivery Method: Circle System Utilized Preoxygenation: Pre-oxygenation with 100% oxygen Induction Type: IV induction Ventilation: Mask ventilation without difficulty Laryngoscope Size: Miller and 2 Grade View: Grade I Tube type: Oral Tube size: 7.0 mm Number of attempts: 1 Airway Equipment and Method: Stylet and Oral airway Placement Confirmation: ETT inserted through vocal cords under direct vision, positive ETCO2 and breath sounds checked- equal and bilateral Secured at: 21 cm Tube secured with: Tape Dental Injury: Teeth and Oropharynx as per pre-operative assessment

## 2024-04-26 NOTE — Consult Note (Signed)
 WOC Nurse requested for preoperative stoma site marking  Discussed surgical procedure and stoma creation with patient and family.  Explained role of the WOC nurse team.  Provided the patient with educational booklet and provided samples of pouching options.  Answered patient and family questions.   Examined patient sitting, and standing in order to place the marking in the patient's visual field, away from any creases or abdominal contour issues and within the rectus muscle.  Attempted to mark below the patient's belt line, patient with pendulous belly patient at times reports wearing pants above marking site and at other times below .   Marked for colostomy in the LUQ  __9__ cm to the left of the umbilicus and __4__ cm above the umbilicus.  Patient's abdomen cleansed with CHG wipes at site markings, allowed to air dry prior to marking.Covered mark with thin film transparent dressing to preserve mark until date of surgery.     Thank you, Doyal Polite, MSN, RN, [facility] WOC Team [phone] (Available Mon-Fri 0700-1500)

## 2024-04-27 DIAGNOSIS — K5792 Diverticulitis of intestine, part unspecified, without perforation or abscess without bleeding: Secondary | ICD-10-CM | POA: Diagnosis not present

## 2024-04-27 LAB — CBC
HCT: 44.4 % (ref 36.0–46.0)
Hemoglobin: 14.7 g/dL (ref 12.0–15.0)
MCH: 32 pg (ref 26.0–34.0)
MCHC: 33.1 g/dL (ref 30.0–36.0)
MCV: 96.7 fL (ref 80.0–100.0)
Platelets: 173 K/uL (ref 150–400)
RBC: 4.59 MIL/uL (ref 3.87–5.11)
RDW: 13.1 % (ref 11.5–15.5)
WBC: 13 K/uL — ABNORMAL HIGH (ref 4.0–10.5)
nRBC: 0 % (ref 0.0–0.2)

## 2024-04-27 LAB — COMPREHENSIVE METABOLIC PANEL WITH GFR
ALT: 6 U/L (ref 0–44)
AST: 12 U/L — ABNORMAL LOW (ref 15–41)
Albumin: 3.3 g/dL — ABNORMAL LOW (ref 3.5–5.0)
Alkaline Phosphatase: 66 U/L (ref 38–126)
Anion gap: 14 (ref 5–15)
BUN: 7 mg/dL — ABNORMAL LOW (ref 8–23)
CO2: 21 mmol/L — ABNORMAL LOW (ref 22–32)
Calcium: 9.2 mg/dL (ref 8.9–10.3)
Chloride: 98 mmol/L (ref 98–111)
Creatinine, Ser: 0.65 mg/dL (ref 0.44–1.00)
GFR, Estimated: >60 mL/min (ref >60)
Glucose, Bld: 225 mg/dL — ABNORMAL HIGH (ref 70–99)
Potassium: 4.1 mmol/L (ref 3.5–5.1)
Sodium: 133 mmol/L — ABNORMAL LOW (ref 135–145)
Total Bilirubin: 0.7 mg/dL (ref 0.0–1.2)
Total Protein: 7.9 g/dL (ref 6.5–8.1)

## 2024-04-27 LAB — HEPARIN LEVEL (UNFRACTIONATED)
Heparin Unfractionated: 0.98 [IU]/mL — ABNORMAL HIGH (ref 0.30–0.70)
Heparin Unfractionated: 0.82 [IU]/mL — ABNORMAL HIGH (ref 0.30–0.70)

## 2024-04-27 LAB — GLUCOSE, CAPILLARY
Glucose-Capillary: 235 mg/dL — ABNORMAL HIGH (ref 70–99)
Glucose-Capillary: 211 mg/dL — ABNORMAL HIGH (ref 70–99)
Glucose-Capillary: 194 mg/dL — ABNORMAL HIGH (ref 70–99)
Glucose-Capillary: 272 mg/dL — ABNORMAL HIGH (ref 70–99)
Glucose-Capillary: 299 mg/dL — ABNORMAL HIGH (ref 70–99)

## 2024-04-27 LAB — APTT
aPTT: 66 s — ABNORMAL HIGH (ref 24–36)
aPTT: 70 s — ABNORMAL HIGH (ref 24–36)

## 2024-04-27 MED ORDER — ACETAMINOPHEN 325 MG PO TABS
650.0000 mg | ORAL_TABLET | Freq: Four times a day (QID) | ORAL | Status: DC
  Administered 2024-04-27 – 2024-04-29 (×9): 650 mg via ORAL
  Filled 2024-04-27 (×9): qty 2

## 2024-04-27 MED ORDER — CALCIUM CARBONATE ANTACID 500 MG PO CHEW
400.0000 mg | CHEWABLE_TABLET | Freq: Once | ORAL | Status: AC
  Administered 2024-04-27: 400 mg via ORAL
  Filled 2024-04-27: qty 2

## 2024-04-27 MED ORDER — CHLORHEXIDINE GLUCONATE CLOTH 2 % EX PADS
6.0000 | MEDICATED_PAD | Freq: Every day | CUTANEOUS | Status: DC
  Administered 2024-04-27 – 2024-05-06 (×5): 6 via TOPICAL

## 2024-04-27 MED ORDER — METHOCARBAMOL 500 MG PO TABS
500.0000 mg | ORAL_TABLET | Freq: Four times a day (QID) | ORAL | Status: DC
  Administered 2024-04-27 – 2024-04-29 (×9): 500 mg via ORAL
  Filled 2024-04-27 (×9): qty 1

## 2024-04-27 NOTE — Progress Notes (Addendum)
 PHARMACY - ANTICOAGULATION CONSULT NOTE  Pharmacy Consult for heparin  drip  Indication: hx recent portal vein thrombosis (PTA Eliquis  on hold)  Allergies  Allergen Reactions   Bee Venom Anaphylaxis and Hives   Empagliflozin Itching, Rash and Dermatitis   Ibuprofen Nausea And Vomiting   Statins Nausea Only and Other (See Comments)    Muscle spasms and cramps, too    Patient Measurements: Height: 5' 9 (175.3 cm) Weight: 122.5 kg (270 lb) IBW/kg (Calculated) : 66.2 HEPARIN  DW (KG): 94.7  Vital Signs: Temp: 97.8 F (36.6 C) (12/06 0939) Temp Source: Oral (12/06 0939) BP: 159/101 (12/06 1012) Pulse Rate: 75 (12/06 1012)  Labs: Recent Labs    04/24/24 1444 04/25/24 0017 04/25/24 0516 04/25/24 0818 04/25/24 2239 04/26/24 0930 04/27/24 0427  HGB 15.1*  --  14.4  --   --  14.1 14.7  HCT 45.2  --  43.8  --   --  43.1 44.4  PLT 198  --  160  --   --  184 173  APTT  --  37*  --    < > 103* 38* 66*  HEPARINUNFRC  --  >1.10*  --   --   --  0.89* 0.98*  CREATININE 0.67  --  0.64  --   --   --  0.65   < > = values in this interval not displayed.    Estimated Creatinine Clearance: 100.8 mL/min (by C-G formula based on SCr of 0.65 mg/dL).   Medical History: Past Medical History:  Diagnosis Date   Asthma    Cancer (HCC)    Cannabis abuse    Chronic pain    Diabetes mellitus type 2, controlled (HCC)    Hypertension     Medications:  - on Eliquis  5mg  bid PTA (last dose taken on 04/24/24)  Assessment: COREE RIESTER is a 63 y.o. female who was recently hospitalized from 04/10/24 to 04/17/24 for acute complicated sigmoid diverticulitis with microperforation/possible abscess and portal vein thrombosis.  She was disharged with Eliquis  dose pack for portal vein thrombosis and oral abx for diverticulitis.  She presented back to the ED on 04/24/2024 with c/o abdominal pain and subsequently underwent sigmoid colectomy and creation of colostomy on 04/26/24.  Heparin  drip started on  admission on 04/25/24, held for OR on 04/26/24 and resumed back at 8:30 PM on 04/26/24 post-op (NO BOLUS per MD's request).  Today, 04/27/2024: - heparin  level collected at 12:47 PM is lightly elevated at 0.82 ,but this is likely from residual effect of Eliquis .  aPTT is therapeutic at 70 sec.  Since heparin  and aPTT levels are not correlating due to Eliquis , will adjust heparin  drip using aPTT at this time - cbc ok - no bleeding documented   Goal of Therapy:  Heparin  level 0.3-0.7 units/ml aPTT 66-102 seconds Monitor platelets by anticoagulation protocol: Yes   Plan:  - continue heparin  drip at 1700 units/hr - daily heparin  level and cbc  - monitor for s/sx bleeding   Jaylan Duggar P 04/27/2024,10:40 AM

## 2024-04-27 NOTE — Assessment & Plan Note (Addendum)
 Reported chest pain prior to procedure Reports that this pain is usually present when she is hungry and resolves with drinking water Given IV Pepcid  Negative EKG Cleared for surgery No further pain reported and she is delighted to be advancing her diet

## 2024-04-27 NOTE — Assessment & Plan Note (Signed)
 Hemoglobin A1c 7.0, good control Placed on sensitive sliding scale insulin  every 4 hours

## 2024-04-27 NOTE — Progress Notes (Signed)
 Progress Note   Patient: Lisa Ibarra FMW:[id number] DOB: April 23, 1961 DOA: 04/24/2024     3 DOS: the patient was seen and examined on 04/27/2024   Brief hospital course: 63yo with h/o COPD, chronic HFpEF, HTN, HLD, T2DM, chronic pain syndrome, class 2 obesity, and tobacco and marijuana use who presented on 12/3 with abdominal pain. She was previously admitted from 11/3-7 for acute diverticulitis with bacteremia with Pseudomonas and E coli. She returned from 11/19-26 with diverticulitis with microperforation and possible abscess that was treated with antibiotics; she was also found to have a portal vein thrombosis and started on Eliquis . She returned to the ER due to persistent abdominal pain, having run out of antibiotics 3 days PTA. 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 She was treated with Dilaudid , Zofran , and Zosyn  and surgery was consulted. Ex lap on 12/5 for perforated diverticulitis s/p ostomy creation.   Assessment & Plan Acute diverticulitis 2 recent hospital admissions for this problem, treated with antibiotics Repeat CT now shows severe sigmoid diverticulosis with perforated diverticulitis demonstrating progressive phlegmon in the distal sigmoid mesentery and extraluminal gas with septic thrombophlebitis involving peripheral branches of the inferior mesenteric vein WBC count 10.5, no signs of sepsis General Surgery consulted Continue Zosyn  Underwent ex lap with sigmoid colectomy with ostomy creation 12/5 Pain control with Oxy, Dilaudid  PCA; anxiety control with Ativan  Non-cardiac chest pain Reported chest pain prior to procedure Reports that this pain is usually present when she is hungry and resolves with drinking water Given IV Pepcid  Negative EKG Cleared for surgery No further pain reported and she is delighted  to be advancing her diet Chronic heart failure with preserved ejection fraction (HFpEF) (HCC) Hypoxemia COPD (chronic obstructive pulmonary disease) (HCC) Patient was not hypoxic on arrival to the ED but later desatted to 87% on room air after receiving IV opiates for pain Currently satting well on RA  Echo done 03/22/2024 showing EF 55 to 60% and grade 1 diastolic dysfunction; no signs of volume overload at this time Diabetes mellitus (HCC) Hemoglobin A1c 7.0, good control Placed on sensitive sliding scale insulin  every 4 hours 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  Patient was discharged on Eliquis  Hold Eliquis  for now, on IV heparin  for anticoagulation Resume Eliquis  when ok with surgery Essential hypertension BP currently 128/110 Continue metoprolol  and hydralazine  Class 2 obesity due to excess calories with body mass index (BMI) of 39.0 to 39.9 in adult Body mass index is 39.87 kg/m.SABRA  Weight loss should be encouraged Outpatient PCP/bariatric medicine f/u encouraged Significantly low or high BMI is associated with higher medical risk including morbidity and mortality       Consultants: Surgery Oncology   Procedures: Ex lap with ostomy creation 12/5   Antibiotics: Zosyn  12/3-  30 Day Unplanned Readmission Risk Score    Flowsheet Row ED to Hosp-Admission (Current) from 04/24/2024 in [place] 4TH FLOOR PROGRESSIVE CARE AND UROLOGY  30 Day Unplanned Readmission Risk Score (%) 25.54 Filed at 04/27/2024 0401    This score is the patient's risk of an unplanned readmission within 30 days of being discharged (0 -100%). The score is based on dignosis, age, lab data, medications, orders, and past utilization.   Low:  0-14.9   Medium: 15-21.9   High: 22-29.9   Extreme: 30 and above           Subjective: Feeling  better today.  Up in chair, pain controlled with PCA.   Objective: Vitals:   04/27/24 1322 04/27/24 1740  BP: (!) 128/110    Pulse: 68   Resp:  15  Temp: 97.9 F (36.6 C)   SpO2: 99% 100%    Intake/Output Summary (Last 24 hours) at 04/27/2024 1745 Last data filed at 04/27/2024 9472 Gross per 24 hour  Intake 1565.54 ml  Output 1300 ml  Net 265.54 ml   Filed Weights   04/24/24 1426  Weight: 122.5 kg    Exam:  General:  Appears calm and comfortable and is in NAD Eyes:  normal lids, iris ENT:  grossly normal hearing, lips & tongue, mmm Cardiovascular:  RRR. No LE edema.  Respiratory:   CTA bilaterally with no wheezes/rales/rhonchi.  Normal respiratory effort. Abdomen:  soft, appropriately tender; ostomy without significant stool output Skin:  no rash or induration seen on limited exam Musculoskeletal:  grossly normal tone BUE/BLE, good ROM, no bony abnormality Psychiatric:  grossly normal mood and affect, speech fluent and appropriate, AOx3 Neurologic:  CN 2-12 grossly intact, moves all extremities in coordinated fashion  Data Reviewed: I have reviewed the patient's lab results since admission.  Pertinent labs for today include:  Na++ 133 CO2 21 Glucose 225 Albumin  3.3 WBC 13     Family Communication: None present    Code Status: Full Code    Disposition: Status is: Inpatient Remains inpatient appropriate because: ongoing management     Time spent: 50 minutes  Unresulted Labs (From admission, onward)     Start     Ordered   04/28/24 0500  Heparin  level (unfractionated)  Daily,   R      04/26/24 1450   04/28/24 0500  APTT  Daily,   R      04/27/24 1320   04/27/24 0500  Comprehensive metabolic panel  Daily,   R      04/26/24 1704   04/26/24 0930  CBC  Daily,   R      04/26/24 0138             Author: Delon Herald, MD 04/27/2024 5:45 PM  For on call review [email or web address].

## 2024-04-27 NOTE — Assessment & Plan Note (Signed)
 Patient was not hypoxic on arrival to the ED but later desatted to 87% on room air after receiving IV opiates for pain Currently satting well on RA  Echo done 03/22/2024 showing EF 55 to 60% and grade 1 diastolic dysfunction; no signs of volume overload at this time

## 2024-04-27 NOTE — Assessment & Plan Note (Addendum)
 CT11/23 showed portal venous thrombosis  Patient was discharged on Eliquis  Hold Eliquis  for now, on IV heparin  for anticoagulation Resume Eliquis  when ok with surgery

## 2024-04-27 NOTE — Assessment & Plan Note (Addendum)
 2 recent hospital admissions for this problem, treated with antibiotics Repeat CT now shows severe sigmoid diverticulosis with perforated diverticulitis demonstrating progressive phlegmon in the distal sigmoid mesentery and extraluminal gas with septic thrombophlebitis involving peripheral branches of the inferior mesenteric vein WBC count 10.5, no signs of sepsis General Surgery consulted Continue Zosyn  Underwent ex lap with sigmoid colectomy with ostomy creation 12/5 Pain control with Oxy, Dilaudid  PCA; anxiety control with Ativan 

## 2024-04-27 NOTE — Progress Notes (Signed)
 1 Day Post-Op   Subjective/Chief Complaint: Feels well overall this morning. Sitting up in chair. No vomiting, tolerating clear liquids, feels hungry.   Objective: Vital signs in last 24 hours: Temp:  [97.5 F (36.4 C)-99.1 F (37.3 C)] 98 F (36.7 C) (12/06 0520) Pulse Rate:  [71-95] 76 (12/06 0520) Resp:  [15-22] 19 (12/06 0520) BP: (101-222)/(69-99) 139/88 (12/06 0520) SpO2:  [89 %-100 %] 96 % (12/06 0736) Last BM Date : 04/25/24  Intake/Output from previous day: 12/05 0701 - 12/06 0700 In: 3115.5 [P.O.:300; I.V.:2338; IV Piggyback:477.5] Out: 2450 [Urine:2200; Blood:250] Intake/Output this shift: No intake/output data recorded.  PE: General: resting comfortably, NAD Neuro: alert and oriented, no focal deficits Resp: normal work of breathing Abdomen: soft, appropriately tender. Midline wound open at skin, wound bed clean, no bleeding. Colostomy with a small amount of bowel sweat in bag, no gas. Extremities: warm and well-perfused GU: foley draining clear yellow urine   Lab Results:  Recent Labs    04/25/24 0516 04/26/24 0930  WBC 8.0 7.5  HGB 14.4 14.1  HCT 43.8 43.1  PLT 160 184   BMET Recent Labs    04/24/24 1444 04/25/24 0516  NA 136 136  K 3.6 3.6  CL 101 102  CO2 25 26  GLUCOSE 169* 171*  BUN 7* 5*  CREATININE 0.67 0.64  CALCIUM  9.4 8.8*   PT/INR No results for input(s): LABPROT, INR in the last 72 hours. ABG No results for input(s): PHART, HCO3 in the last 72 hours.  Invalid input(s): PCO2, PO2  Studies/Results: No results found.   Anti-infectives: Anti-infectives (From admission, onward)    Start     Dose/Rate Route Frequency Ordered Stop   04/25/24 0300  piperacillin -tazobactam (ZOSYN ) IVPB 3.375 g        3.375 g 12.5 mL/hr over 240 Minutes Intravenous Every 8 hours 04/24/24 2338     04/24/24 2100  piperacillin -tazobactam (ZOSYN ) IVPB 3.375 g        3.375 g 100 mL/hr over 30 Minutes Intravenous  Once 04/24/24 2053  04/24/24 2142       Assessment/Plan: 76F with perf diverticulitis, POD1 s/p open sigmoid colectomy with end colostomy. - Advance to full liquid diet - On PCA for pain control. Will add scheduled tylenol  and robaxin . - Portal vein thrombosis: on heparin  gtt - Mobilize - Continue antibiotics for 5 days postop.    LOS: 3 days    Lisa Ibarra 04/27/2024

## 2024-04-27 NOTE — Plan of Care (Signed)

## 2024-04-27 NOTE — Progress Notes (Signed)
 PHARMACY - ANTICOAGULATION CONSULT NOTE  Pharmacy Consult for Heparin  Indication: atrial fibrillation & portal vein thrombosis  Allergies  Allergen Reactions   Bee Venom Anaphylaxis and Hives   Empagliflozin Itching, Rash and Dermatitis   Ibuprofen Nausea And Vomiting   Statins Nausea Only and Other (See Comments)    Muscle spasms and cramps, too    Patient Measurements: Height: 5' 9 (175.3 cm) Weight: 122.5 kg (270 lb) IBW/kg (Calculated) : 66.2 HEPARIN  DW (KG): 94.7  Vital Signs: Temp: 98.5 F (36.9 C) (12/06 0002) Temp Source: Oral (12/06 0002) BP: 156/86 (12/06 0002) Pulse Rate: 90 (12/06 0002)  Labs: Recent Labs    04/24/24 1444 04/25/24 0017 04/25/24 0516 04/25/24 0818 04/25/24 2239 04/26/24 0930 04/27/24 0427  HGB 15.1*  --  14.4  --   --  14.1  --   HCT 45.2  --  43.8  --   --  43.1  --   PLT 198  --  160  --   --  184  --   APTT  --  37*  --    < > 103* 38* 66*  HEPARINUNFRC  --  >1.10*  --   --   --  0.89* 0.98*  CREATININE 0.67  --  0.64  --   --   --   --    < > = values in this interval not displayed.    Estimated Creatinine Clearance: 100.8 mL/min (by C-G formula based on SCr of 0.64 mg/dL).   Medical History: Past Medical History:  Diagnosis Date   Asthma    Cancer (HCC)    Cannabis abuse    Chronic pain    Diabetes mellitus type 2, controlled (HCC)    Hypertension     Medications:  PTA apixaban  5mg  po BID - patient unable to state when last dose taken.  Assessment: 63 yr female with abdominal pain.  PMH significant for AFib, portal vein thrombosis and on Eliquis  PTA.  Recent hospitalization for sigmoid diverticulitis with microperforation and possible abscess.  Surgery consulted for possible surgical resection 12/3: Eliquis  on hold and IV heparin  to begin Will monitor heparin  therapy with aPTT as heparin  levels can be falsely elevated due to recent DOAC use. Will use aPTT until effects of DOAC have worn off and aPTT and heparin  levels  correlate 12/5:  Heparin  held for surgery.  Resumed at 8pm  04/27/24  aPTT 66 sec- therapeutic on IV heparin  1700 units/hr Heparin  level 0.98- remains elevated as anticipated; continue to adjust levels based on aPTT until heparin  level correlates CBC WNL No bleeding or disruptions to infusion  Goal of Therapy:  Heparin  level 0.3-0.7 units/ml aPTT 66-102 seconds Monitor platelets by anticoagulation protocol: Yes   Plan:  Continue heparin  infusion at 1700 units/hr  Recheck aptt at 1230 Monitor daily aPTT & heparin  level until correlating Daily CBC, monitor signs/symptoms of bleeding   Rosaline Millet, PharmD, BCPS 04/27/2024 5:10 AM

## 2024-04-27 NOTE — Progress Notes (Signed)
 Patient had a coughing fit and blood saturated her abdominal wound covering.  Bandage was changed, emotional support was offered, and education about stenting the wound with a pillow when coughing was reinforced.  Patient stated she understood and is now keeping a pillow in her lap.

## 2024-04-27 NOTE — Assessment & Plan Note (Addendum)
 BP currently 128/110 Continue metoprolol  and hydralazine 

## 2024-04-27 NOTE — Assessment & Plan Note (Addendum)
 Body mass index is 39.87 kg/m.SABRA  Weight loss should be encouraged Outpatient PCP/bariatric medicine f/u encouraged Significantly low or high BMI is associated with higher medical risk including morbidity and mortality

## 2024-04-28 ENCOUNTER — Encounter (HOSPITAL_COMMUNITY): Payer: Self-pay | Admitting: General Surgery

## 2024-04-28 DIAGNOSIS — F39 Unspecified mood [affective] disorder: Secondary | ICD-10-CM | POA: Insufficient documentation

## 2024-04-28 DIAGNOSIS — K5792 Diverticulitis of intestine, part unspecified, without perforation or abscess without bleeding: Secondary | ICD-10-CM | POA: Diagnosis not present

## 2024-04-28 LAB — COMPREHENSIVE METABOLIC PANEL WITH GFR
ALT: <5 U/L (ref 0–44)
AST: <10 U/L — ABNORMAL LOW (ref 15–41)
Albumin: 3.1 g/dL — ABNORMAL LOW (ref 3.5–5.0)
Alkaline Phosphatase: 58 U/L (ref 38–126)
Anion gap: 8 (ref 5–15)
BUN: 8 mg/dL (ref 8–23)
CO2: 27 mmol/L (ref 22–32)
Calcium: 9.7 mg/dL (ref 8.9–10.3)
Chloride: 99 mmol/L (ref 98–111)
Creatinine, Ser: 0.63 mg/dL (ref 0.44–1.00)
GFR, Estimated: >60 mL/min (ref >60)
Glucose, Bld: 164 mg/dL — ABNORMAL HIGH (ref 70–99)
Potassium: 3.7 mmol/L (ref 3.5–5.1)
Sodium: 134 mmol/L — ABNORMAL LOW (ref 135–145)
Total Bilirubin: 0.4 mg/dL (ref 0.0–1.2)
Total Protein: 6.9 g/dL (ref 6.5–8.1)

## 2024-04-28 LAB — GLUCOSE, CAPILLARY
Glucose-Capillary: 161 mg/dL — ABNORMAL HIGH (ref 70–99)
Glucose-Capillary: 170 mg/dL — ABNORMAL HIGH (ref 70–99)
Glucose-Capillary: 198 mg/dL — ABNORMAL HIGH (ref 70–99)
Glucose-Capillary: 212 mg/dL — ABNORMAL HIGH (ref 70–99)
Glucose-Capillary: 228 mg/dL — ABNORMAL HIGH (ref 70–99)
Glucose-Capillary: 230 mg/dL — ABNORMAL HIGH (ref 70–99)

## 2024-04-28 LAB — HEPARIN LEVEL (UNFRACTIONATED)
Heparin Unfractionated: 0.67 [IU]/mL (ref 0.30–0.70)
Heparin Unfractionated: 0.72 [IU]/mL — ABNORMAL HIGH (ref 0.30–0.70)

## 2024-04-28 LAB — CBC
HCT: 39.8 % (ref 36.0–46.0)
Hemoglobin: 12.8 g/dL (ref 12.0–15.0)
MCH: 31.5 pg (ref 26.0–34.0)
MCHC: 32.2 g/dL (ref 30.0–36.0)
MCV: 98 fL (ref 80.0–100.0)
Platelets: 203 K/uL (ref 150–400)
RBC: 4.06 MIL/uL (ref 3.87–5.11)
RDW: 13.2 % (ref 11.5–15.5)
WBC: 9.9 K/uL (ref 4.0–10.5)
nRBC: 0 % (ref 0.0–0.2)

## 2024-04-28 LAB — APTT
aPTT: 58 s — ABNORMAL HIGH (ref 24–36)
aPTT: 78 s — ABNORMAL HIGH (ref 24–36)

## 2024-04-28 MED ORDER — ORAL CARE MOUTH RINSE: 15.0000 mL | OROMUCOSAL | Status: AC | PRN

## 2024-04-28 MED ORDER — PANTOPRAZOLE SODIUM 40 MG PO TBEC
40.0000 mg | DELAYED_RELEASE_TABLET | Freq: Every day | ORAL | Status: DC
  Administered 2024-04-28 – 2024-05-06 (×9): 40 mg via ORAL
  Filled 2024-04-28 (×9): qty 1

## 2024-04-28 MED ORDER — INSULIN ASPART 100 UNIT/ML IJ SOLN
0.0000 [IU] | Freq: Three times a day (TID) | INTRAMUSCULAR | Status: DC
  Administered 2024-04-28: 3 [IU] via SUBCUTANEOUS
  Administered 2024-04-29: 1 [IU] via SUBCUTANEOUS
  Administered 2024-04-29: 3 [IU] via SUBCUTANEOUS
  Administered 2024-04-29 – 2024-04-30 (×3): 2 [IU] via SUBCUTANEOUS
  Administered 2024-04-30: 3 [IU] via SUBCUTANEOUS
  Administered 2024-05-01 (×3): 2 [IU] via SUBCUTANEOUS
  Administered 2024-05-02: 3 [IU] via SUBCUTANEOUS
  Administered 2024-05-02 – 2024-05-04 (×6): 2 [IU] via SUBCUTANEOUS
  Administered 2024-05-04: 3 [IU] via SUBCUTANEOUS
  Administered 2024-05-04: 2 [IU] via SUBCUTANEOUS
  Administered 2024-05-05: 3 [IU] via SUBCUTANEOUS
  Administered 2024-05-05 – 2024-05-06 (×4): 2 [IU] via SUBCUTANEOUS
  Filled 2024-04-28 (×2): qty 2
  Filled 2024-04-28: qty 3
  Filled 2024-04-28 (×5): qty 2
  Filled 2024-04-28: qty 3
  Filled 2024-04-28 (×4): qty 2
  Filled 2024-04-28: qty 3
  Filled 2024-04-28: qty 2
  Filled 2024-04-28: qty 3
  Filled 2024-04-28 (×5): qty 2
  Filled 2024-04-28: qty 3

## 2024-04-28 MED ORDER — MAGIC MOUTHWASH W/LIDOCAINE: 10.0000 mL | Freq: Three times a day (TID) | ORAL | Status: DC | PRN

## 2024-04-28 MED ORDER — SIMETHICONE 80 MG PO CHEW
80.0000 mg | CHEWABLE_TABLET | Freq: Once | ORAL | Status: AC
  Administered 2024-04-28: 80 mg via ORAL
  Filled 2024-04-28: qty 1

## 2024-04-28 NOTE — Progress Notes (Signed)
 Progress Note   Patient: Lisa Ibarra FMW:[id number] DOB: 04-04-1961 DOA: 04/24/2024     4 DOS: the patient was seen and examined on 04/28/2024   Brief hospital course: 63yo with h/o COPD, chronic HFpEF, HTN, HLD, T2DM, chronic pain syndrome, class 2 obesity, and tobacco and marijuana use who presented on 12/3 with abdominal pain. She was previously admitted from 11/3-7 for acute diverticulitis with bacteremia with Pseudomonas and E coli. She returned from 11/19-26 with diverticulitis with microperforation and possible abscess that was treated with antibiotics; she was also found to have a portal vein thrombosis and started on Eliquis . She returned to the ER due to persistent abdominal pain, having run out of antibiotics 3 days PTA. 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 She was treated with Dilaudid , Zofran , and Zosyn  and surgery was consulted. Ex lap on 12/5 for perforated diverticulitis s/p ostomy creation.   Assessment & Plan Acute diverticulitis 2 recent hospital admissions for this problem, treated with antibiotics Repeat CT now shows severe sigmoid diverticulosis with perforated diverticulitis demonstrating progressive phlegmon in the distal sigmoid mesentery and extraluminal gas with septic thrombophlebitis involving peripheral branches of the inferior mesenteric vein WBC count 10.5, no signs of sepsis General Surgery consulted Continue Zosyn  Underwent ex lap with sigmoid colectomy with ostomy creation 12/5 Pain control with Oxy, Dilaudid  PCA; anxiety control with Ativan  Likely to transition to PO medications tomorrow Needs PT evaluation and to increase mobility Agree with foley dc Mood disorder Reports SI today, but contracts for safety She has had irritability and this is likely related Some of it is situational, as  she was told by her work that she is being let go with termination of benefits as of 12/12 She does request psychiatry consult and is open to medications She contracts for safety at this time and does not appear to need a sitter or IVC Non-cardiac chest pain Reported chest pain prior to procedure Reports that this pain is usually present when she is hungry and resolves with drinking water Given IV Pepcid ; will give magic mouthwash and start daily PPI Negative EKG Cleared for surgery No further pain reported and she is delighted to be advancing her diet Chronic heart failure with preserved ejection fraction (HFpEF) (HCC) Hypoxemia COPD (chronic obstructive pulmonary disease) (HCC) Patient was not hypoxic on arrival to the ED but later desatted to 87% on room air after receiving IV opiates for pain Currently satting well on RA  Echo done 03/22/2024 showing EF 55 to 60% and grade 1 diastolic dysfunction; no signs of volume overload at this time Diabetes mellitus (HCC) Hemoglobin A1c 7.0, good control Placed on sensitive sliding scale insulin  every 4 hours 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  Patient was discharged on Eliquis  Hold Eliquis  for now, on IV heparin  for anticoagulation Resume Eliquis  when ok with surgery Essential hypertension BP currently 128/110 Continue metoprolol  and hydralazine  Class 2 obesity due to excess calories with body mass index (BMI) of 39.0 to 39.9 in adult Body mass index is 39.87 kg/m.SABRA  Weight loss should be encouraged Outpatient PCP/bariatric medicine f/u encouraged Significantly low or high BMI is associated with higher medical risk including morbidity and mortality       Consultants: Surgery Oncology Psychiatry PT   Procedures: Ex lap with ostomy creation 12/5   Antibiotics: Zosyn  12/3-  30 Day Unplanned Readmission Risk Score  Flowsheet Row ED to Hosp-Admission (Current) from 04/24/2024 in [facility]  30 Day Unplanned Readmission Risk Score (%) 23.09 Filed at 04/28/2024 0801    This score is the patient's risk of an unplanned readmission within 30 days of being discharged (0 -100%). The score is based on dignosis, age, lab data, medications, orders, and past utilization.   Low:  0-14.9   Medium: 15-21.9   High: 22-29.9   Extreme: 30 and above           Subjective: Up in chair.  Looks good, advancing diet.  She is upset about a number of things today - lack of Purewick and having to get up to bedside commode was one.  She also noted ongoing chest pain that feels like indigestion to her, present since Friday.  Finally, she reports that she recently lost her job associated with her health conditions and she will lose her insurance next Friday.  She is very upset about this and feels very depressed.  On further discussion, she reports SI but contracts for safety.  She does want to talk with psychiatry.   Objective: Vitals:   04/28/24 0512 04/28/24 0739  BP:    Pulse:    Resp: 12 16  Temp:    SpO2: 97% 97%    Intake/Output Summary (Last 24 hours) at 04/28/2024 0831 Last data filed at 04/28/2024 9472 Gross per 24 hour  Intake 339.47 ml  Output 350 ml  Net -10.53 ml   Filed Weights   04/24/24 1426  Weight: 122.5 kg    Exam:  General:  Appears calm and comfortable and is in NAD, up in chair Eyes:  normal lids, iris ENT:  grossly normal hearing, lips & tongue, mmm Cardiovascular:  RRR. No LE edema.  Respiratory:   CTA bilaterally with no wheezes/rales/rhonchi.  Normal respiratory effort. Abdomen:  soft, appropriately tender, incision is C/D/I; ostomy in place without significant output Skin:  no rash or induration seen on limited exam Musculoskeletal:  grossly normal tone BUE/BLE, good ROM, no bony abnormality Psychiatric:  labile mood and affect, speech fluent and appropriate, AOx3 Neurologic:  CN 2-12 grossly intact, moves all extremities  in coordinated fashion  Data Reviewed: I have reviewed the patient's lab results since admission.  Pertinent labs for today include:   Na++ 134 Glucose 164 Albumin  3.1 Normal CBC     Family Communication: None present  Mobility: PT Consulted     Code Status: Full Code  Disposition: Status is: Inpatient Remains inpatient appropriate because: ongoing monitoring     Time spent: 50 minutes  Unresulted Labs (From admission, onward)     Start     Ordered   04/29/24 0500  APTT  Daily,   R      04/28/24 0309   04/29/24 0500  Heparin  level (unfractionated)  Daily,   R      04/28/24 0309   04/28/24 1000  Heparin  level (unfractionated)  Once-Timed,   TIMED        04/28/24 0309   04/28/24 1000  APTT  Once-Timed,   TIMED        04/28/24 0309   04/27/24 0500  Comprehensive metabolic panel  Daily,   R      04/26/24 1704   04/26/24 0930  CBC  Daily,   R      04/26/24 0138             Author: Delon Herald, MD 04/28/2024  8:31 AM  For on call review [email or web address].

## 2024-04-28 NOTE — Progress Notes (Addendum)
 PHARMACY - ANTICOAGULATION CONSULT NOTE  Pharmacy Consult for heparin  drip  Indication: hx recent portal vein thrombosis (PTA Eliquis  on hold)  Allergies  Allergen Reactions   Bee Venom Anaphylaxis and Hives   Empagliflozin Itching, Rash and Dermatitis   Ibuprofen Nausea And Vomiting   Statins Nausea Only and Other (See Comments)    Muscle spasms and cramps, too    Patient Measurements: Height: 5' 9 (175.3 cm) Weight: 122.5 kg (270 lb) IBW/kg (Calculated) : 66.2 HEPARIN  DW (KG): 94.7  Vital Signs: Temp: 97.6 F (36.4 C) (12/07 0410) Temp Source: Oral (12/07 0410) BP: 118/78 (12/07 0410) Pulse Rate: 59 (12/07 0410)  Labs: Recent Labs    04/26/24 0930 04/27/24 0427 04/27/24 1247 04/28/24 0520  HGB 14.1 14.7  --  12.8  HCT 43.1 44.4  --  39.8  PLT 184 173  --  203  APTT 38* 66* 70*  --   HEPARINUNFRC 0.89* 0.98* 0.82*  --   CREATININE  --  0.65  --  0.63    Estimated Creatinine Clearance: 100.8 mL/min (by C-G formula based on SCr of 0.63 mg/dL).   Medical History: Past Medical History:  Diagnosis Date   Asthma    Cancer (HCC)    Cannabis abuse    Chronic pain    Diabetes mellitus type 2, controlled (HCC)    Hypertension     Medications:  - on Eliquis  5mg  bid PTA (last dose taken on 04/24/24)  Assessment: Lisa Ibarra is a 63 y.o. female who was recently hospitalized from 04/10/24 to 04/17/24 for acute complicated sigmoid diverticulitis with microperforation/possible abscess and portal vein thrombosis.  She was disharged with Eliquis  dose pack for portal vein thrombosis and oral abx for diverticulitis.  She presented back to the ED on 04/24/2024 with c/o abdominal pain and subsequently underwent sigmoid colectomy and creation of colostomy on 04/26/24.  Heparin  drip started on admission on 04/25/24, held for OR on 04/26/24 and resumed back at 8:30 PM on 04/26/24 post-op (NO BOLUS per MD's request).  Significant events:  - 12/6: pt's RN documented at 1:30 PM that  pt had a coughing fit and blood saturated her abdominal wound covering. Bandaged changed - 12/7:  Third shift RN discovered around ~1 AM on 04/28/24 that heparin  drip was stopped at noon on 04/27/24 by day shift RN.  There was no documentation as to why this was held and whether pt's RN received order from a provider to hold heparin  drip. Heparin  drip resumed back at ~1:47AM on 12/7.   Today, 04/28/2024: - heparin  level collected at 9:37 AM is 0.67 and aPTT is 58 secs.  Since heparin  and aPTT levels are not correlating due to Eliquis , will adjust heparin  drip using aPTT at this time. - per Pt's RN, no issues with IV line and no bleeding noted - hgb down 12.8, plts ok   Goal of Therapy:  Heparin  level 0.3-0.7 units/ml aPTT 66-102 seconds Monitor platelets by anticoagulation protocol: Yes   Plan:  - Increase heparin  drip slightly to 1800 units/hr - check 6 hr heparin  level and aPTT - daily heparin  level and cbc  - monitor for s/sx bleeding   Osmond Steckman P 04/28/2024,9:40 AM  _____________________________________  Adden:  - Repeat heparin  level drawn at 4:48pm is 0.72 and aPTT is 78 secs - continue with heparin  drip at 1800 units/hr - f/u with AM labs and adjust dose if needed  [name], PharmD, BCPS 04/28/2024 6:29 PM

## 2024-04-28 NOTE — Progress Notes (Addendum)
 2 Days Post-Op   Subjective/Chief Complaint: Up in chair this morning. Has had flatus from colostomy and tolerating full liquids. Hgb stable, WBC is normal.   Objective: Vital signs in last 24 hours: Temp:  [97.6 F (36.4 C)-98.1 F (36.7 C)] 97.6 F (36.4 C) (12/07 0410) Pulse Rate:  [59-75] 59 (12/07 0410) Resp:  [12-18] 16 (12/07 0739) BP: (118-159)/(71-110) 118/78 (12/07 0410) SpO2:  [94 %-100 %] 97 % (12/07 0739) FiO2 (%):  [21 %] 21 % (12/07 0512) Last BM Date : 04/25/24  Intake/Output from previous day: 12/06 0701 - 12/07 0700 In: 339.5 [I.V.:175.2; IV Piggyback:164.3] Out: 350 [Urine:350] Intake/Output this shift: No intake/output data recorded.  PE: General: resting comfortably, NAD Neuro: alert and oriented, no focal deficits Resp: normal work of breathing Abdomen: soft, appropriately tender. Midline wound open at skin, wound bed clean, no bleeding. Colostomy with bowel sweat in bag. Extremities: warm and well-perfused GU: foley draining clear yellow urine   Lab Results:  Recent Labs    04/27/24 0427 04/28/24 0520  WBC 13.0* 9.9  HGB 14.7 12.8  HCT 44.4 39.8  PLT 173 203   BMET Recent Labs    04/27/24 0427 04/28/24 0520  NA 133* 134*  K 4.1 3.7  CL 98 99  CO2 21* 27  GLUCOSE 225* 164*  BUN 7* 8  CREATININE 0.65 0.63  CALCIUM  9.2 9.7   PT/INR No results for input(s): LABPROT, INR in the last 72 hours. ABG No results for input(s): PHART, HCO3 in the last 72 hours.  Invalid input(s): PCO2, PO2  Studies/Results: No results found.   Anti-infectives: Anti-infectives (From admission, onward)    Start     Dose/Rate Route Frequency Ordered Stop   04/25/24 0300  piperacillin -tazobactam (ZOSYN ) IVPB 3.375 g        3.375 g 12.5 mL/hr over 240 Minutes Intravenous Every 8 hours 04/24/24 2338 05/01/24 0819   04/24/24 2100  piperacillin -tazobactam (ZOSYN ) IVPB 3.375 g        3.375 g 100 mL/hr over 30 Minutes Intravenous  Once  04/24/24 2053 04/24/24 2142       Assessment/Plan: 74F with perf diverticulitis, POD2 s/p open sigmoid colectomy with end colostomy. - Advance to soft diet - Remove foley - On PCA for pain control and still using fairly frequently. Plan to transition to oral medications tomorrow if tolerating diet. Continue scheduled tylenol  and robaxin .  - Portal vein thrombosis: on heparin  gtt - Mobilize - Continue antibiotics until POD5 (stop date in place)    LOS: 4 days    Lisa Ibarra 04/28/2024

## 2024-04-28 NOTE — Progress Notes (Addendum)
 Heparin  gtt resumed at 17ml/hr per order. On call NP and pharmacist aware.

## 2024-04-28 NOTE — Assessment & Plan Note (Addendum)
 2 recent hospital admissions for this problem, treated with antibiotics Repeat CT now shows severe sigmoid diverticulosis with perforated diverticulitis demonstrating progressive phlegmon in the distal sigmoid mesentery and extraluminal gas with septic thrombophlebitis involving peripheral branches of the inferior mesenteric vein WBC count 10.5, no signs of sepsis General Surgery consulted Continue Zosyn  Underwent ex lap with sigmoid colectomy with ostomy creation 12/5 Pain control with Oxy, Dilaudid  PCA; anxiety control with Ativan  Likely to transition to PO medications tomorrow Needs PT evaluation and to increase mobility Agree with foley dc

## 2024-04-28 NOTE — Assessment & Plan Note (Signed)
 Hemoglobin A1c 7.0, good control Placed on sensitive sliding scale insulin  every 4 hours

## 2024-04-28 NOTE — Assessment & Plan Note (Signed)
 Patient was not hypoxic on arrival to the ED but later desatted to 87% on room air after receiving IV opiates for pain Currently satting well on RA  Echo done 03/22/2024 showing EF 55 to 60% and grade 1 diastolic dysfunction; no signs of volume overload at this time

## 2024-04-28 NOTE — Assessment & Plan Note (Signed)
 CT11/23 showed portal venous thrombosis  Patient was discharged on Eliquis  Hold Eliquis  for now, on IV heparin  for anticoagulation Resume Eliquis  when ok with surgery

## 2024-04-28 NOTE — Assessment & Plan Note (Signed)
 BP currently 128/110 Continue metoprolol  and hydralazine 

## 2024-04-28 NOTE — Evaluation (Signed)
 Physical Therapy Evaluation Patient Details Name: Lisa Ibarra MRN: [id number] DOB: 1961/02/07 Today's Date: 04/28/2024  History of Present Illness  63yo with who presented on 12/3 with abdominal pain.  She was previously admitted from 11/3-7 for acute diverticulitis with bacteremia with Pseudomonas and E coli.  She returned from 11/19-26 with diverticulitis with microperforation and possible abscess that was treated with antibiotics;   CT revealed severe sigmoid diverticulosis with perforated diverticulitis and progressive phlegmon in the distal sigmoid mesentary with extraluminal gas and septic thrombophlebitis of the interior mesenteric vein. surgery consulted.  pt now s/p LAPAROTOMY, EXPLORATORY,  SIGMOID COLECTOMY, end  - COLOSTOMY  on 04/26/24. PMH: COPD, chronic HFpEF, HTN, HLD, T2DM, chronic pain syndrome, class 2 obesity, and tobacco and marijuana use  Clinical Impression  Pt admitted with above diagnosis.  Pt emotional during session stating that no one is listening to me! Pt agreeable to standing only and taking a few steps in place, reviewed importance of ambulating after abdominal surgery and pt states she understands however she is not doing it. Will continue to follow in acute setting. Pt will likely benefit from HHPT at d/c   Pt currently with functional limitations due to the deficits listed below (see PT Problem List). Pt will benefit from acute skilled PT to increase their independence and safety with mobility to allow discharge.           If plan is discharge home, recommend the following: A little help with walking and/or transfers;A little help with bathing/dressing/bathroom;Help with stairs or ramp for entrance;Assist for transportation;Assistance with cooking/housework   Can travel by private vehicle        Equipment Recommendations None recommended by PT  Recommendations for Other Services       Functional Status Assessment Patient has had a recent decline in  their functional status and demonstrates the ability to make significant improvements in function in a reasonable and predictable amount of time.     Precautions / Restrictions Precautions Precautions: Fall Restrictions Weight Bearing Restrictions Per Provider Order: No      Mobility  Bed Mobility               General bed mobility comments: pt in recliner, reports she has been sleeping in recliner since admission    Transfers Overall transfer level: Needs assistance Equipment used: Rolling walker (2 wheels) Transfers: Sit to/from Stand Sit to Stand: Min assist           General transfer comment: 3 attempts needed d/t iincr pain. cues for hand placement and to power up with LEs    Ambulation/Gait             Pre-gait activities: marching in place, steps fwd and back General Gait Details: pt declines ambulation  Stairs            Wheelchair Mobility     Tilt Bed    Modified Rankin (Stroke Patients Only)       Balance Overall balance assessment: Needs assistance Sitting-balance support: Feet supported, No upper extremity supported Sitting balance-Leahy Scale: Fair Sitting balance - Comments: not challenged d/t abd pain   Standing balance support: Reliant on assistive device for balance, During functional activity Standing balance-Leahy Scale: Poor                               Pertinent Vitals/Pain Pain Assessment Pain Assessment: Faces Faces Pain Scale: Hurts whole lot Pain Location:  abd Pain Descriptors / Indicators: Sore, Crying Pain Intervention(s): Limited activity within patient's tolerance, Monitored during session, PCA encouraged, Repositioned    Home Living Family/patient expects to be discharged to:: Private residence Living Arrangements: Children (dtr and son in social worker) Available Help at Discharge: Family Type of Home: Apartment Home Access: Level entry       Home Layout: One level Home Equipment: Grab bars -  tub/shower;Hand held Programmer, Systems (2 wheels);Shower seat - built in Additional Comments: infor above taken from last admission; pt becomes emotional and irritated during session stating loudly NO one will listen to me!!-- therefore PLOF questions deferred    Prior Function               Mobility Comments: pt reports she is  independent/ mod I with ambulation, used RW since last few admissions. HHPT had started however pt readmitted. often sleeps in recliner per pt report       Extremity/Trunk Assessment   Upper Extremity Assessment Upper Extremity Assessment: Defer to OT evaluation    Lower Extremity Assessment Lower Extremity Assessment: RLE deficits/detail;LLE deficits/detail RLE Deficits / Details: MMT not fully tested d/t pain, limited cooperation. at least 3+/5 RLE: Unable to fully assess due to pain LLE Deficits / Details: MMT not fully tested d/t pain, limited cooperation. at least 3+/5       Communication   Communication Communication: No apparent difficulties    Cognition Arousal: Alert Behavior During Therapy: Lability, Flat affect (calm and converant upon PT departure)   PT - Cognitive impairments: No apparent impairments                         Following commands: Intact       Cueing Cueing Techniques: Verbal cues     General Comments      Exercises     Assessment/Plan    PT Assessment Patient needs continued PT services  PT Problem List Decreased activity tolerance;Decreased balance;Decreased knowledge of use of DME;Pain;Decreased mobility       PT Treatment Interventions DME instruction;Therapeutic exercise;Functional mobility training;Therapeutic activities;Patient/family education;Gait training    PT Goals (Current goals can be found in the Care Plan section)  Acute Rehab PT Goals PT Goal Formulation: With patient Time For Goal Achievement: 05/12/24 Potential to Achieve Goals: Good    Frequency Min 3X/week      Co-evaluation               AM-PAC PT 6 Clicks Mobility  Outcome Measure Help needed turning from your back to your side while in a flat bed without using bedrails?: A Lot Help needed moving from lying on your back to sitting on the side of a flat bed without using bedrails?: A Lot   Help needed standing up from a chair using your arms (e.g., wheelchair or bedside chair)?: A Little Help needed to walk in hospital room?: A Little Help needed climbing 3-5 steps with a railing? : A Lot 6 Click Score: 12    End of Session   Activity Tolerance: Patient limited by pain Patient left: in chair;with call bell/phone within reach   PT Visit Diagnosis: Other abnormalities of gait and mobility (R26.89)    Time: 8652-8597 PT Time Calculation (min) (ACUTE ONLY): 15 min   Charges:   PT Evaluation $PT Eval Low Complexity: 1 Low   PT General Charges $$ ACUTE PT VISIT: 1 Visit         Rexene, PT  Acute Rehab Dept [facility]) [phone]  04/28/2024   [facility] 04/28/2024, 3:01 PM

## 2024-04-28 NOTE — Assessment & Plan Note (Signed)
 Body mass index is 39.87 kg/m.SABRA  Weight loss should be encouraged Outpatient PCP/bariatric medicine f/u encouraged Significantly low or high BMI is associated with higher medical risk including morbidity and mortality

## 2024-04-28 NOTE — Assessment & Plan Note (Addendum)
 Reported chest pain prior to procedure Reports that this pain is usually present when she is hungry and resolves with drinking water Given IV Pepcid ; will give magic mouthwash and start daily PPI Negative EKG Cleared for surgery No further pain reported and she is delighted to be advancing her diet

## 2024-04-28 NOTE — Assessment & Plan Note (Signed)
 Reports SI today, but contracts for safety She has had irritability and this is likely related Some of it is situational, as she was told by her work that she is being let go with termination of benefits as of 12/12 She does request psychiatry consult and is open to medications She contracts for safety at this time and does not appear to need a sitter or IVC

## 2024-04-29 DIAGNOSIS — K5792 Diverticulitis of intestine, part unspecified, without perforation or abscess without bleeding: Secondary | ICD-10-CM | POA: Diagnosis not present

## 2024-04-29 LAB — COMPREHENSIVE METABOLIC PANEL WITH GFR
ALT: 6 U/L (ref 0–44)
AST: <10 U/L — ABNORMAL LOW (ref 15–41)
Albumin: 3.2 g/dL — ABNORMAL LOW (ref 3.5–5.0)
Alkaline Phosphatase: 68 U/L (ref 38–126)
Anion gap: 8 (ref 5–15)
BUN: 8 mg/dL (ref 8–23)
CO2: 27 mmol/L (ref 22–32)
Calcium: 9.6 mg/dL (ref 8.9–10.3)
Chloride: 102 mmol/L (ref 98–111)
Creatinine, Ser: 0.66 mg/dL (ref 0.44–1.00)
GFR, Estimated: >60 mL/min (ref >60)
Glucose, Bld: 188 mg/dL — ABNORMAL HIGH (ref 70–99)
Potassium: 3.8 mmol/L (ref 3.5–5.1)
Sodium: 136 mmol/L (ref 135–145)
Total Bilirubin: 0.4 mg/dL (ref 0.0–1.2)
Total Protein: 6.8 g/dL (ref 6.5–8.1)

## 2024-04-29 LAB — GLUCOSE, CAPILLARY
Glucose-Capillary: 141 mg/dL — ABNORMAL HIGH (ref 70–99)
Glucose-Capillary: 200 mg/dL — ABNORMAL HIGH (ref 70–99)
Glucose-Capillary: 204 mg/dL — ABNORMAL HIGH (ref 70–99)
Glucose-Capillary: 217 mg/dL — ABNORMAL HIGH (ref 70–99)
Glucose-Capillary: 236 mg/dL — ABNORMAL HIGH (ref 70–99)

## 2024-04-29 LAB — CBC
HCT: 39.4 % (ref 36.0–46.0)
Hemoglobin: 12.3 g/dL (ref 12.0–15.0)
MCH: 31.1 pg (ref 26.0–34.0)
MCHC: 31.2 g/dL (ref 30.0–36.0)
MCV: 99.7 fL (ref 80.0–100.0)
Platelets: 199 K/uL (ref 150–400)
RBC: 3.95 MIL/uL (ref 3.87–5.11)
RDW: 13.2 % (ref 11.5–15.5)
WBC: 8 K/uL (ref 4.0–10.5)
nRBC: 0 % (ref 0.0–0.2)

## 2024-04-29 LAB — SURGICAL PATHOLOGY

## 2024-04-29 LAB — HEPARIN LEVEL (UNFRACTIONATED)
Heparin Unfractionated: 0.72 [IU]/mL — ABNORMAL HIGH (ref 0.30–0.70)
Heparin Unfractionated: 0.44 [IU]/mL (ref 0.30–0.70)
Heparin Unfractionated: 0.58 [IU]/mL (ref 0.30–0.70)

## 2024-04-29 LAB — APTT: aPTT: 89 s — ABNORMAL HIGH (ref 24–36)

## 2024-04-29 MED ORDER — ACETAMINOPHEN 500 MG PO TABS
1000.0000 mg | ORAL_TABLET | Freq: Four times a day (QID) | ORAL | Status: DC
  Administered 2024-04-29 – 2024-05-06 (×26): 1000 mg via ORAL
  Filled 2024-04-29 (×28): qty 2

## 2024-04-29 MED ORDER — HYDROMORPHONE HCL 1 MG/ML IJ SOLN
0.5000 mg | INTRAMUSCULAR | Status: DC | PRN
  Administered 2024-04-29 – 2024-05-02 (×13): 1 mg via INTRAVENOUS
  Administered 2024-05-03 (×2): 0.5 mg via INTRAVENOUS
  Administered 2024-05-04 (×3): 1 mg via INTRAVENOUS
  Administered 2024-05-04: 0.5 mg via INTRAVENOUS
  Administered 2024-05-05 – 2024-05-06 (×6): 1 mg via INTRAVENOUS
  Filled 2024-04-29 (×26): qty 1

## 2024-04-29 MED ORDER — ENSURE PLUS HIGH PROTEIN PO LIQD
237.0000 mL | Freq: Two times a day (BID) | ORAL | Status: DC
  Administered 2024-04-30: 237 mL via ORAL

## 2024-04-29 MED ORDER — HYDROXYZINE HCL 25 MG PO TABS
25.0000 mg | ORAL_TABLET | Freq: Three times a day (TID) | ORAL | Status: DC | PRN
  Administered 2024-05-01: 25 mg via ORAL
  Filled 2024-04-29: qty 1

## 2024-04-29 MED ORDER — OXYCODONE HCL 5 MG PO TABS
5.0000 mg | ORAL_TABLET | ORAL | Status: DC | PRN
  Administered 2024-04-29 – 2024-05-06 (×20): 10 mg via ORAL
  Filled 2024-04-29 (×22): qty 2

## 2024-04-29 MED ORDER — METHOCARBAMOL 500 MG PO TABS
1000.0000 mg | ORAL_TABLET | Freq: Four times a day (QID) | ORAL | Status: DC
  Administered 2024-04-29 – 2024-05-06 (×28): 1000 mg via ORAL
  Filled 2024-04-29 (×28): qty 2

## 2024-04-29 NOTE — Assessment & Plan Note (Addendum)
 Patient was not hypoxic on arrival to the ED but later desatted to 87% on room air after receiving IV opiates for pain Currently satting well on RA  Echo done 03/22/2024 showing EF 55 to 60% and grade 1 diastolic dysfunction; no signs of volume overload at this time Triple neb therapy was previously prescribed at home but her nebulizer is broken and she has only been using Albuterol  HFA prn Will dc nebulizer treatments other than prn albuterol  for now and continue to follow

## 2024-04-29 NOTE — Assessment & Plan Note (Signed)
 CT11/23 showed portal venous thrombosis  Patient was discharged on Eliquis  Hold Eliquis  for now, on IV heparin  for anticoagulation Resume Eliquis  at time of dc, per Dr. Timmy

## 2024-04-29 NOTE — Assessment & Plan Note (Addendum)
 Reported SI 12/7, but contracts for safety She has had irritability and this is likely related Some of it is situational, as she was told by her work that she is being let go with termination of benefits as of 12/12 She does request psychiatry consult and is open to medications She contracts for safety at this time and does not appear to need a sitter or IVC

## 2024-04-29 NOTE — Consult Note (Signed)
 Psychiatry Consult Note  Patient was seen and assessed by the psychiatry provider for evaluation. The assessment was not completed in full, as the patient openly admitted that her prior suicidal statement was made out of frustration and uncontrolled pain, rather than genuine intent to harm herself. She denies any previous psychiatric history, prior suicide attempts, or self-injurious behaviors.  The patient is retired, lives independently in a single-family home, and identifies herself as a education officer, environmental. Throughout the encounter, she appeared irritable at times, though her emotional response was appropriate given her ongoing physical pain and frustration with her medical care. She reports that she continues to experience significant postoperative pain following major abdominal surgery, which limits her mobility, though she remains willing to participate in rehabilitation efforts.  The patient described multiple recent hospital encounters related to pain management. She stated that after a recent discharge, her primary care provider refused to refill her prescribed narcotic medication and instructed her to return to the emergency department. She complied, but upon discharge, was again referred back to her primary care provider, who once more declined to prescribe pain medication. This cycle has left her feeling dismissed and invalidated. She expressed frustration with what she perceives as a stigma against Black women and pain management, stating that she feels "no one is listening."  She denies suicidal ideation, homicidal ideation, or auditory/visual hallucinations at this time. She reports that her main concern is being heard and having her pain managed appropriately. She requested that her current PA no longer see her, stating that the provider "does not listen." She described undergoing wound care earlier in the day without premedication, explaining that the gauze was not wetted before removal, which caused  her significant pain.  When offered a chaplain consult, she declined, stating, "I am a education officer, environmental. I know what I need." She reiterated that she simply wants compassion and effective pain management.  Most of the psychiatric encounter consisted of motivational interviewing and supportive therapy, allowing the patient to vent for approximately 45 minutes about her experiences with surgery, chronic pain, and feeling unheard by medical providers. The provider primarily listened and reflected her concerns.  The patient is able to contract for safety and demonstrates good insight and judgment. She remains engaged, motivated, and hopeful for improvement, though easily frustrated when discussing pain-related distress. She verbalizes understanding that her suicidal statements stemmed from pain-related frustration and not from intent or desire to die.  Psychiatric assessment indicates that her mood and affect are generally appropriate, with mild irritability when discussing pain and perceived stigma. Her primary goal remains achieving adequate pain control. Suicidal ideations appear secondary to uncontrolled pain rather than an underlying depressive or psychotic disorder.  Plan: No current evidence of medication-seeking behavior, secondary gain, or addiction risk.  Begin Hydroxyzine  25 mg PO TID PRN for anxiety symptoms.  Encourage continued medical management of pain and consideration of adjunctive therapies.  Recommend ongoing outpatient psychotherapy to address medical trauma, frustration tolerance, and coping skills.  Psychiatry consult service to sign off at this time.

## 2024-04-29 NOTE — Assessment & Plan Note (Signed)
 BP currently 128/110 Continue metoprolol  and hydralazine 

## 2024-04-29 NOTE — Assessment & Plan Note (Signed)
 Reported chest pain prior to procedure Reports that this pain is usually present when she is hungry and resolves with drinking water Given IV Pepcid ; will give magic mouthwash and start daily PPI Negative EKG Cleared for surgery No further pain reported and she is delighted to be advancing her diet

## 2024-04-29 NOTE — Assessment & Plan Note (Addendum)
 Body mass index is 39.87 kg/m.SABRA  Weight loss should be encouraged Outpatient PCP/bariatric medicine f/u encouraged Significantly low or high BMI is associated with higher medical risk including morbidity and mortality

## 2024-04-29 NOTE — Consult Note (Signed)
 WOC Nurse ostomy follow up Stoma type/location: LUQ Stomal assessment/size: 28 mm oblong, below skin level Peristomal assessment: dry blood  Treatment options for stomal/peristomal skin: peristomal plane intact, blood clots and dry blood cleansed off, deep crease at 9 o'clock, soft tissue Output 10 ml bloody drainage Ostomy pouching: 1pc convex #[id number] with barrier ring [id number]  Education provided:  Patient was emotionally upset at pain level, on PCA pump, provider had come in and upset her with removal of abd dressing per patient, primary nurse and I spoke to her and allowed her to vent her feelings about her care, used positive psychology during our conversation.  Not able to review much with patient this session. Appliance that was one piece flat was leaking and removed, reviewed some key points but patient was not focusing on ostomy care, she does know she will need to learn how to care for her own ostomy.  Reinforced mid-abd moist dressing with tape to upper section, nurse aware and knows to order more supplies. Enrolled patient in [place] Discharge program: Yes Setting up teaching time tomorrow with daughter, if possible.   Will place patient on follow-up for continued education. Please reconsult if wound worsens in condition and notify provider.   Sherrilyn Hals MSN RN CWOCN WOC [facility]  [phone] (Available from 7-3 pm Mon-Friday)

## 2024-04-29 NOTE — Progress Notes (Signed)
 Mobility Specialist - Progress Note   04/29/24 1551  Mobility  Activity Pivoted/transferred to/from [facility];Ambulated with assistance  Level of Assistance Minimal assist, patient does 75% or more  Assistive Device Front wheel walker  Distance Ambulated (ft) 4 ft  Range of Motion/Exercises Active  Activity Response Tolerated fair  Mobility visit 1 Mobility  Mobility Specialist Start Time (ACUTE ONLY) 1530  Mobility Specialist Stop Time (ACUTE ONLY) 1551  Mobility Specialist Time Calculation (min) (ACUTE ONLY) 21 min   Pt was found on recliner chair and agreeable to mobilize. Pt stated wanting to use BSC prior to ambulation. Afterwards pt stood and grew fatigued from standing and transfer. Able to take a couple steps back to recliner chair. At EOS was left on recliner chair with all needs met. RN in room.   Erminio Leos,  Mobility Specialist Can be reached via Secure Chat

## 2024-04-29 NOTE — Progress Notes (Signed)
 PHARMACY - ANTICOAGULATION CONSULT NOTE  Pharmacy Consult for heparin  drip  Indication: hx recent portal vein thrombosis, prothrombin II gene mutation, PTA Eliquis  on hold  Allergies  Allergen Reactions   Bee Venom Anaphylaxis and Hives   Empagliflozin Itching, Rash and Dermatitis   Ibuprofen Nausea And Vomiting   Statins Nausea Only and Other (See Comments)    Muscle spasms and cramps, too    Patient Measurements: Height: 5' 9 (175.3 cm) Weight: 122.5 kg (270 lb) IBW/kg (Calculated) : 66.2 HEPARIN  DW (KG): 94.7  Vital Signs: Temp: 98.2 F (36.8 C) (12/08 2116) Temp Source: Oral (12/08 1402) BP: 157/79 (12/08 2116) Pulse Rate: 81 (12/08 2116)  Labs: Recent Labs    04/27/24 0427 04/27/24 1247 04/28/24 0520 04/28/24 0937 04/28/24 1648 04/29/24 0458 04/29/24 1419 04/29/24 2143  HGB 14.7  --  12.8  --   --  12.3  --   --   HCT 44.4  --  39.8  --   --  39.4  --   --   PLT 173  --  203  --   --  199  --   --   APTT 66*   < >  --  58* 78* 89*  --   --   HEPARINUNFRC 0.98*   < >  --  0.67 0.72* 0.72* 0.44 0.58  CREATININE 0.65  --  0.63  --   --  0.66  --   --    < > = values in this interval not displayed.    Estimated Creatinine Clearance: 100.8 mL/min (by C-G formula based on SCr of 0.66 mg/dL).   Medical History: Past Medical History:  Diagnosis Date   Asthma    Cancer (HCC)    Cannabis abuse    Chronic pain    Diabetes mellitus type 2, controlled (HCC)    Hypertension     Medications:  - on Eliquis  5mg  bid PTA (last dose taken on 04/24/24)  Assessment: Lisa Ibarra is a 63 y.o. female who was recently hospitalized from 04/10/24 to 04/17/24 for acute complicated sigmoid diverticulitis with microperforation/possible abscess and portal vein thrombosis.  She was disharged with Eliquis  dose pack for portal vein thrombosis and oral abx for diverticulitis.  She presented back to the ED on 04/24/2024 with c/o abdominal pain and subsequently underwent sigmoid  colectomy and creation of colostomy on 04/26/24.  Heparin  drip started on admission on 04/25/24, held for OR on 04/26/24 and resumed 04/26/24 post-op (NO BOLUS per MD's request).  Significant events:  - 12/6: pt's RN documented at 1:30 PM that pt had a coughing fit and blood saturated her abdominal wound covering. Bandaged changed - 12/7:  Third shift RN discovered around ~1 AM on 04/28/24 that heparin  drip was stopped at noon on 04/27/24 by day shift RN.  There was no documentation as to why this was held and whether pt's RN received order from a provider to hold heparin  drip. Heparin  drip resumed back at ~1:47AM on 12/7.   Today, 04/29/2024: - Heparin  level 0.44, therapeutic on heparin  1600 units/hr.  - No bleeding, no IV complications reported by RN   2143 heparin  level 0.58 therapeutic on 1600 units/hr No bleeding reported  Goal of Therapy:  Heparin  level 0.3-0.7 units/ml aPTT 66-102 seconds Monitor platelets by anticoagulation protocol: Yes   Plan:  - Continue heparin  drip at 1600 units/hr - Daily heparin  level and cbc  - Monitor for s/sx bleeding   Leeroy Mace RPh 04/29/2024, 10:52  PM

## 2024-04-29 NOTE — TOC Initial Note (Signed)
 Transition of Care [facility]) - Initial/Assessment Note    Patient Details  Name: Lisa Ibarra MRN: [id number] Date of Birth: 1961-02-09  Transition of Care [facility] CM/SW Contact:    Bascom Service, RN Phone Number: 04/29/2024, 3:04 PM  Clinical Narrative:  Beatris to dtr [name] about d/c plans-Active w/[facility] only dtr prefers rep Burnard accepted & aware. [name] feels she is being taught how to manage to colostomy therefore no need for HHRN,she declines HHOT. [employer] rep Jermaine to deliver 3n1 to rm prior d/c-[name] agreed to 3n1.Has own tranport home.                 Expected Discharge Plan: Home w Home Health Services Barriers to Discharge: Continued Medical Work up   Patient Goals and CMS Choice   CMS Medicare.gov Compare Post Acute Care list provided to:: Patient Represenative (must comment) ([name](dtr)) Choice offered to / list presented to : Adult Children [place] ownership interest in [facility].provided to:: Adult Children    Expected Discharge Plan and Services   Discharge Planning Services: CM Consult Post Acute Care Choice: Home Health, Durable Medical Equipment Living arrangements for the past 2 months: Single Family Home                 DME Arranged: 3-N-1 DME Agency: [employer] Date DME Agency Contacted: 04/29/24 Time DME Agency Contacted: 1502 Representative spoke with at DME Agency: [place] HH Arranged: PT HH Agency: [facility] Date [facility] Agency Contacted: 04/29/24 Time HH Agency Contacted: 1503 Representative spoke with at [facility] Agency: Burnard  Prior Living Arrangements/Services Living arrangements for the past 2 months: Single Family Home Lives with:: Adult Children   Do you feel safe going back to the place where you live?: Yes          Current home services: DME (mt:Jrupcz w/[facility])    Activities of Daily Living   ADL Screening (condition at time of admission) Independently performs ADLs?: Yes  (appropriate for developmental age) Is the patient deaf or have difficulty hearing?: No Does the patient have difficulty seeing, even when wearing glasses/contacts?: No Does the patient have difficulty concentrating, remembering, or making decisions?: No  Permission Sought/Granted Permission sought to share information with : Case Manager                Emotional Assessment              Admission diagnosis:  Diverticulitis [K57.92] Acute diverticulitis [K57.92] Patient Active Problem List   Diagnosis Date Noted   Mood disorder 04/28/2024   Non-cardiac chest pain 04/26/2024   Portal vein thrombosis 04/25/2024   Class 2 obesity due to excess calories with body mass index (BMI) of 39.0 to 39.9 in adult 04/25/2024   Hypoxemia 04/24/2024   Diverticulitis 04/10/2024   Acute diverticulitis 03/26/2024   Generalized weakness 03/26/2024   Obesity, Class III, BMI 40-49.9 (morbid obesity) (HCC) 03/26/2024   Chronic heart failure with preserved ejection fraction (HFpEF) (HCC) 03/26/2024   Asthma with COPD with exacerbation (HCC) 03/26/2024   COPD (chronic obstructive pulmonary disease) (HCC) 03/23/2024   Acute exacerbation of CHF (congestive heart failure) (HCC) 03/21/2024   COVID-19 08/18/2021   Hypokalemia 08/18/2021   Thrombocytopenia 08/18/2021   SIRS (systemic inflammatory response syndrome) (HCC) 08/18/2021   Uncontrolled diabetes mellitus 05/29/2012   Influenza A H1N1 infection 05/28/2012   Acute bronchitis 05/27/2012   Asthma exacerbation 05/26/2012   Diabetes mellitus (HCC) 05/26/2012   Diarrhea 05/26/2012   Essential  hypertension 01/05/2007   Headache 01/05/2007   CERVICAL CANCER, HX OF 01/05/2007   PCP:  Patient, No Pcp Per Pharmacy:   [facility] [address] [place], [place] - [address] [place] [address] Phone: [phone] Fax: [phone]     Social Drivers of Health (SDOH) Social History: SDOH Screenings    Food Insecurity: No Food Insecurity (04/25/2024)  Housing: Low Risk  (04/25/2024)  Transportation Needs: No Transportation Needs (04/25/2024)  Utilities: Not At Risk (04/25/2024)  Financial Resource Strain: Low Risk  (10/27/2023)   Received from [facility]  Physical Activity: Unknown (10/27/2023)   Received from [facility]  Social Connections: Moderately Integrated (10/27/2023)   Received from [facility]  Stress: Stress Concern Present (10/27/2023)   Received from [facility]  Tobacco Use: High Risk (04/24/2024)   Received from [facility]   SDOH Interventions:     Readmission Risk Interventions    04/12/2024   10:17 AM 03/24/2024   12:01 PM 03/22/2024    1:37 PM  Readmission Risk Prevention Plan  Post Dischage Appt  Complete   Medication Screening  Complete   Transportation Screening Complete Complete Complete  PCP or Specialist Appt within 5-7 Days   Complete  PCP or Specialist Appt within 3-5 Days Complete    Home Care Screening   Complete  Medication Review (RN CM)   Complete  HRI or Home Care Consult Complete    Social Work Consult for Recovery Care Planning/Counseling Complete    Palliative Care Screening Not Applicable    Medication Review Oceanographer) Complete

## 2024-04-29 NOTE — Progress Notes (Signed)
 Remaining 26 mL of IV dilaudid  syringe wasted in 4E stericycle after PCA pump discontinued with Margery Nikki naomi OBIE.

## 2024-04-29 NOTE — Anesthesia Postprocedure Evaluation (Signed)
 Anesthesia Post Note  Patient: Lisa Ibarra  Procedure(s) Performed: LAPAROTOMY, EXPLORATORY COLECTOMY, PARTIAL, CREATION COLOSTOMY, SIGMOID COLECTOMY     Patient location during evaluation: PACU Anesthesia Type: General Level of consciousness: awake and alert Pain management: pain level controlled Vital Signs Assessment: post-procedure vital signs reviewed and stable Respiratory status: spontaneous breathing, nonlabored ventilation, respiratory function stable and patient connected to nasal cannula oxygen Cardiovascular status: blood pressure returned to baseline and stable Postop Assessment: no apparent nausea or vomiting Anesthetic complications: no   No notable events documented.             Canyon Lohr,W. EDMOND

## 2024-04-29 NOTE — Progress Notes (Signed)
 Lisa Ibarra is having little pain this morning.  This is abdominal pain.  He says his gas bubbles.  She has a ostomy.  She is on IV antibiotics.  She is on PCA.  She also is on IV heparin .  Her labs today show white cell count 8 hemoglobin 12.3.  Platelet count 199,000.  Her blood sugar is 188.  Sodium 136.  Potassium 3.8.  BUN 8 creatinine 0.66.  Calcium  9.6.  She is eating.  She does not have any vomiting.  When she came in, she had the CT scan.  This would not show the intrahepatic portal vein thrombus.  Hopefully this is thinning out nicely.  Again, I would keep her on heparin  until she is discharged and then switch her back over to oral.  She has had no fever.  She has had no bleeding.   Vital signs show temperature of 98.7.  Pulse 62.  Blood pressure 130/71.  Her lungs sound clear bilaterally.  She has good air movement bilaterally.  Cardiac exam regular rate and rhythm.  I do not hear any murmurs.  Abdomen shows a ostomy.  She has some slight distention.  Bowel sounds are present but may be slightly decreased.  Extremities shows some chronic mild edema in the legs.  She is recovering from the surgery for her perforated diverticulum.  She is on IV antibiotics.  She is on the PCA pump for pain.  She continues on the heparin .  She does have the prothrombin II gene mutation.  This is some that we will be permanent.  The lupus anticoagulant may come and go.  She will need long-term anticoagulation.  Right now, the goal is to get her healed up from her surgery.  Hopefully she will be able to go home sometime this week.   Again, I will keep her on heparin  until she is ready to go home.  Then switch over to an oral anticoagulant.   Jeralyn Crease, MD  [name] 2:7

## 2024-04-29 NOTE — Progress Notes (Signed)
 PHARMACY - ANTICOAGULATION CONSULT NOTE  Pharmacy Consult for heparin  drip  Indication: hx recent portal vein thrombosis (PTA Eliquis  on hold)  Allergies  Allergen Reactions   Bee Venom Anaphylaxis and Hives   Empagliflozin Itching, Rash and Dermatitis   Ibuprofen Nausea And Vomiting   Statins Nausea Only and Other (See Comments)    Muscle spasms and cramps, too    Patient Measurements: Height: 5' 9 (175.3 cm) Weight: 122.5 kg (270 lb) IBW/kg (Calculated) : 66.2 HEPARIN  DW (KG): 94.7  Vital Signs: Temp: 98.7 F (37.1 C) (12/08 0448) Temp Source: Oral (12/08 0448) BP: 138/71 (12/08 0448) Pulse Rate: 62 (12/08 0448)  Labs: Recent Labs    04/27/24 0427 04/27/24 1247 04/28/24 0520 04/28/24 0937 04/28/24 1648 04/29/24 0458  HGB 14.7  --  12.8  --   --  12.3  HCT 44.4  --  39.8  --   --  39.4  PLT 173  --  203  --   --  199  APTT 66*   < >  --  58* 78* 89*  HEPARINUNFRC 0.98*   < >  --  0.67 0.72* 0.72*  CREATININE 0.65  --  0.63  --   --   --    < > = values in this interval not displayed.    Estimated Creatinine Clearance: 100.8 mL/min (by C-G formula based on SCr of 0.63 mg/dL).   Medical History: Past Medical History:  Diagnosis Date   Asthma    Cancer (HCC)    Cannabis abuse    Chronic pain    Diabetes mellitus type 2, controlled (HCC)    Hypertension     Medications:  - on Eliquis  5mg  bid PTA (last dose taken on 04/24/24)  Assessment: Lisa Ibarra is a 63 y.o. female who was recently hospitalized from 04/10/24 to 04/17/24 for acute complicated sigmoid diverticulitis with microperforation/possible abscess and portal vein thrombosis.  She was disharged with Eliquis  dose pack for portal vein thrombosis and oral abx for diverticulitis.  She presented back to the ED on 04/24/2024 with c/o abdominal pain and subsequently underwent sigmoid colectomy and creation of colostomy on 04/26/24.  Heparin  drip started on admission on 04/25/24, held for OR on 04/26/24 and  resumed back at 8:30 PM on 04/26/24 post-op (NO BOLUS per MD's request).  Significant events:  - 12/6: pt's RN documented at 1:30 PM that pt had a coughing fit and blood saturated her abdominal wound covering. Bandaged changed - 12/7:  Third shift RN discovered around ~1 AM on 04/28/24 that heparin  drip was stopped at noon on 04/27/24 by day shift RN.  There was no documentation as to why this was held and whether pt's RN received order from a provider to hold heparin  drip. Heparin  drip resumed back at ~1:47AM on 12/7.   Today, 04/29/2024: - heparin  level is 0.72- slightly above goal/stable on 1800 units/hr & aPTT is 89 secs.  Since heparin  has been stable, will assume apixaban  has cleared & adjust heparin  drip using heparin  level.  - per Pt's RN, no issues with IV line and no bleeding noted - hgb down 12.3, plts ok   Goal of Therapy:  Heparin  level 0.3-0.7 units/ml aPTT 66-102 seconds Monitor platelets by anticoagulation protocol: Yes   Plan:  - Decrease heparin  drip slightly to 1600 units/hr - check 8 hr heparin  level  - daily heparin  level and cbc  - monitor for s/sx bleeding   Lisa Ibarra PharmD 04/29/2024,5:41 AM  _____________________________________

## 2024-04-29 NOTE — Assessment & Plan Note (Addendum)
 2 recent hospital admissions for this problem, treated with antibiotics Repeat CT now shows severe sigmoid diverticulosis with perforated diverticulitis demonstrating progressive phlegmon in the distal sigmoid mesentery and extraluminal gas with septic thrombophlebitis involving peripheral branches of the inferior mesenteric vein WBC count 10.5, no signs of sepsis General Surgery consulted Continue Zosyn  Underwent ex lap with sigmoid colectomy with ostomy creation 12/5 Pain control with Oxy, Dilaudid  PCA; anxiety control with Ativan  Transition to PO/IV medications today and off PCA Needs PT evaluation and to increase mobility Patient reports that she cannot hold her abdomen and use both hands effectively to stand; discussed with surgery and will add abdominal binder for support

## 2024-04-29 NOTE — Progress Notes (Signed)
 PHARMACY - ANTICOAGULATION CONSULT NOTE  Pharmacy Consult for heparin  drip  Indication: hx recent portal vein thrombosis, prothrombin II gene mutation, PTA Eliquis  on hold  Allergies  Allergen Reactions   Bee Venom Anaphylaxis and Hives   Empagliflozin Itching, Rash and Dermatitis   Ibuprofen Nausea And Vomiting   Statins Nausea Only and Other (See Comments)    Muscle spasms and cramps, too    Patient Measurements: Height: 5' 9 (175.3 cm) Weight: 122.5 kg (270 lb) IBW/kg (Calculated) : 66.2 HEPARIN  DW (KG): 94.7  Vital Signs: Temp: 98.1 F (36.7 C) (12/08 1402) Temp Source: Oral (12/08 1402) BP: 151/75 (12/08 1402) Pulse Rate: 71 (12/08 1402)  Labs: Recent Labs    04/27/24 0427 04/27/24 1247 04/28/24 0520 04/28/24 0937 04/28/24 1648 04/29/24 0458  HGB 14.7  --  12.8  --   --  12.3  HCT 44.4  --  39.8  --   --  39.4  PLT 173  --  203  --   --  199  APTT 66*   < >  --  58* 78* 89*  HEPARINUNFRC 0.98*   < >  --  0.67 0.72* 0.72*  CREATININE 0.65  --  0.63  --   --  0.66   < > = values in this interval not displayed.    Estimated Creatinine Clearance: 100.8 mL/min (by C-G formula based on SCr of 0.66 mg/dL).   Medical History: Past Medical History:  Diagnosis Date   Asthma    Cancer (HCC)    Cannabis abuse    Chronic pain    Diabetes mellitus type 2, controlled (HCC)    Hypertension     Medications:  - on Eliquis  5mg  bid PTA (last dose taken on 04/24/24)  Assessment: Lisa Ibarra is a 62 y.o. female who was recently hospitalized from 04/10/24 to 04/17/24 for acute complicated sigmoid diverticulitis with microperforation/possible abscess and portal vein thrombosis.  She was disharged with Eliquis  dose pack for portal vein thrombosis and oral abx for diverticulitis.  She presented back to the ED on 04/24/2024 with c/o abdominal pain and subsequently underwent sigmoid colectomy and creation of colostomy on 04/26/24.  Heparin  drip started on admission on 04/25/24,  held for OR on 04/26/24 and resumed 04/26/24 post-op (NO BOLUS per MD's request).  Significant events:  - 12/6: pt's RN documented at 1:30 PM that pt had a coughing fit and blood saturated her abdominal wound covering. Bandaged changed - 12/7:  Third shift RN discovered around ~1 AM on 04/28/24 that heparin  drip was stopped at noon on 04/27/24 by day shift RN.  There was no documentation as to why this was held and whether pt's RN received order from a provider to hold heparin  drip. Heparin  drip resumed back at ~1:47AM on 12/7.   Today, 04/29/2024: - Heparin  level 0.44, therapeutic on heparin  1600 units/hr.  - No bleeding, no IV complications reported by RN  Goal of Therapy:  Heparin  level 0.3-0.7 units/ml aPTT 66-102 seconds Monitor platelets by anticoagulation protocol: Yes   Plan:  - Continue heparin  drip at 1600 units/hr - Check 8 hr confirmatory heparin  level  - Daily heparin  level and cbc  - Monitor for s/sx bleeding   Wanda Hasting PharmD, BCPS [facility] main pharmacy [phone] 04/29/2024 2:42 PM

## 2024-04-29 NOTE — Assessment & Plan Note (Signed)
 Hemoglobin A1c 7.0, good control Placed on sensitive sliding scale insulin  every 4 hours

## 2024-04-29 NOTE — Progress Notes (Signed)
 3 Days Post-Op  Subjective: Patient upset this morning about several issues.  We discussed several of these such as her pain and how I can try to help her with this.  I listened to her other issues, but we discussed how some of these things were not able to all be changed given how the hospital functions to serve all of it's patients.    No stool from her colostomy.  No WOC RN consult yet.  Will place today.  Some flatus.  Tolerating a soft diet with no nausea  ROS: See above, otherwise other systems negative  Objective: Vital signs in last 24 hours: Temp:  [97.9 F (36.6 C)-98.7 F (37.1 C)] 98.7 F (37.1 C) (12/08 0448) Pulse Rate:  [59-63] 62 (12/08 0448) Resp:  [13-19] 18 (12/08 0448) BP: (124-138)/(67-93) 138/71 (12/08 0448) SpO2:  [94 %-100 %] 98 % (12/08 0736) FiO2 (%):  [21 %-28 %] 28 % (12/08 0359) Last BM Date : 04/25/24  Intake/Output from previous day: 12/07 0701 - 12/08 0700 In: 1072.3 [P.O.:480; I.V.:468.2; IV Piggyback:124.2] Out: 350 [Urine:350] Intake/Output this shift: No intake/output data recorded.  PE: Abd: soft, appropriately tender, obese, ND, midline wound is c/d/I with packing in place, colostomy seems flush to the skin, but difficult to fully assess due to clot on the stoma.  Some flatus, but no feculent output yet.  Lab Results:  Recent Labs    04/28/24 0520 04/29/24 0458  WBC 9.9 8.0  HGB 12.8 12.3  HCT 39.8 39.4  PLT 203 199   BMET Recent Labs    04/28/24 0520 04/29/24 0458  NA 134* 136  K 3.7 3.8  CL 99 102  CO2 27 27  GLUCOSE 164* 188*  BUN 8 8  CREATININE 0.63 0.66  CALCIUM  9.7 9.6   PT/INR No results for input(s): LABPROT, INR in the last 72 hours. CMP     Component Value Date/Time   NA 136 04/29/2024 0458   K 3.8 04/29/2024 0458   CL 102 04/29/2024 0458   CO2 27 04/29/2024 0458   GLUCOSE 188 (H) 04/29/2024 0458   BUN 8 04/29/2024 0458   CREATININE 0.66 04/29/2024 0458   CALCIUM  9.6 04/29/2024 0458   PROT  6.8 04/29/2024 0458   ALBUMIN  3.2 (L) 04/29/2024 0458   AST <10 (L) 04/29/2024 0458   ALT 6 04/29/2024 0458   ALKPHOS 68 04/29/2024 0458   BILITOT 0.4 04/29/2024 0458   GFRNONAA >60 04/29/2024 0458   GFRAA >60 04/30/2019 0918   Lipase     Component Value Date/Time   LIPASE 18 04/24/2024 1444       Studies/Results: No results found.  Anti-infectives: Anti-infectives (From admission, onward)    Start     Dose/Rate Route Frequency Ordered Stop   04/25/24 0300  piperacillin -tazobactam (ZOSYN ) IVPB 3.375 g        3.375 g 12.5 mL/hr over 240 Minutes Intravenous Every 8 hours 04/24/24 2338 05/01/24 0819   04/24/24 2100  piperacillin -tazobactam (ZOSYN ) IVPB 3.375 g        3.375 g 100 mL/hr over 30 Minutes Intravenous  Once 04/24/24 2053 04/24/24 2142        Assessment/Plan POD 3, s/p  Hartmann's procedure by Dr. Rubin 12/5 for diverticulitis -will DC PCA today and schedule tylenol , robaxin , PRN oxy and prn dilaudid  to see if this will help with her pain control a bit better -continue soft diet  -WOC consult today -mobilize and ambulate as able, PT following.  Patient states she can't get OOB or her chair to mobilize due to pain. - continue BID dressing changes to midline wound -zosyn  in place with an end date in place as well -labs very stable this morning  FEN - soft VTE - heparin  gtt ID - zosyn   Portal vein thrombosis CHF COPD HTN Obesity DM    LOS: 5 days    Burnard FORBES Banter , [facility] Surgery 04/29/2024, 9:40 AM Please see Amion for pager number during day hours 7:00am-4:30pm or 7:00am -11:30am on weekends

## 2024-04-29 NOTE — Progress Notes (Addendum)
 Progress Note   Patient: Lisa Ibarra FMW:[id number] DOB: Jun 28, 1960 DOA: 04/24/2024     5 DOS: the patient was seen and examined on 04/29/2024   Brief hospital course: 63yo with h/o COPD, chronic HFpEF, HTN, HLD, T2DM, chronic pain syndrome, class 2 obesity, and tobacco and marijuana use who presented on 12/3 with abdominal pain. She was previously admitted from 11/3-7 for acute diverticulitis with bacteremia with Pseudomonas and E coli. She returned from 11/19-26 with diverticulitis with microperforation and possible abscess that was treated with antibiotics; she was also found to have a portal vein thrombosis and started on Eliquis . She returned to the ER due to persistent abdominal pain, having run out of antibiotics 3 days PTA. 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 She was treated with Dilaudid , Zofran , and Zosyn  and surgery was consulted. Ex lap on 12/5 for perforated diverticulitis s/p ostomy creation.    Assessment & Plan Acute diverticulitis 2 recent hospital admissions for this problem, treated with antibiotics Repeat CT now shows severe sigmoid diverticulosis with perforated diverticulitis demonstrating progressive phlegmon in the distal sigmoid mesentery and extraluminal gas with septic thrombophlebitis involving peripheral branches of the inferior mesenteric vein WBC count 10.5, no signs of sepsis General Surgery consulted Continue Zosyn  Underwent ex lap with sigmoid colectomy with ostomy creation 12/5 Pain control with Oxy, Dilaudid  PCA; anxiety control with Ativan  Transition to PO/IV medications today and off PCA Needs PT evaluation and to increase mobility Patient reports that she cannot hold her abdomen and use both hands effectively to stand; discussed with surgery and will add abdominal binder for  support Mood disorder Reported SI 12/7, but contracts for safety She has had irritability and this is likely related Some of it is situational, as she was told by her work that she is being let go with termination of benefits as of 12/12 She does request psychiatry consult and is open to medications She contracts for safety at this time and does not appear to need a sitter or IVC Non-cardiac chest pain Reported chest pain prior to procedure Reports that this pain is usually present when she is hungry and resolves with drinking water Given IV Pepcid ; will give magic mouthwash and start daily PPI Negative EKG Cleared for surgery No further pain reported and she is delighted to be advancing her diet Chronic heart failure with preserved ejection fraction (HFpEF) (HCC) Hypoxemia COPD (chronic obstructive pulmonary disease) (HCC) Patient was not hypoxic on arrival to the ED but later desatted to 87% on room air after receiving IV opiates for pain Currently satting well on RA  Echo done 03/22/2024 showing EF 55 to 60% and grade 1 diastolic dysfunction; no signs of volume overload at this time Triple neb therapy was previously prescribed at home but her nebulizer is broken and she has only been using Albuterol  HFA prn Will dc nebulizer treatments other than prn albuterol  for now and continue to follow Diabetes mellitus (HCC) Hemoglobin A1c 7.0, good control Placed on sensitive sliding scale insulin  every 4 hours 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  Patient was discharged on Eliquis  Hold Eliquis  for now, on IV heparin  for anticoagulation Resume Eliquis  at time of dc, per Dr. Timmy Essential hypertension BP currently 128/110 Continue metoprolol  and hydralazine  Class 2 obesity due to excess calories with body mass index (BMI) of 39.0 to 39.9 in adult Body mass index  is 39.87 kg/m.SABRA  Weight loss should be encouraged Outpatient PCP/bariatric medicine f/u  encouraged Significantly low or high BMI is associated with higher medical risk including morbidity and mortality      Consultants: Surgery Oncology Psychiatry PT   Procedures: Ex lap with ostomy creation 12/5   Antibiotics: Zosyn  12/3-  30 Day Unplanned Readmission Risk Score    Flowsheet Row ED to Hosp-Admission (Current) from 04/24/2024 in [place] 4TH FLOOR PROGRESSIVE CARE AND UROLOGY  30 Day Unplanned Readmission Risk Score (%) 24.1 Filed at 04/29/2024 0400    This score is the patient's risk of an unplanned readmission within 30 days of being discharged (0 -100%). The score is based on dignosis, age, lab data, medications, orders, and past utilization.   Low:  0-14.9   Medium: 15-21.9   High: 22-29.9   Extreme: 30 and above           Subjective: Many complaints but up in chair, eating well, very conversant, does not appear to be in distress.     Objective: Vitals:   04/29/24 1118 04/29/24 1402  BP:  (!) 151/75  Pulse:  71  Resp: 15 18  Temp:  98.1 F (36.7 C)  SpO2: 97% 95%    Intake/Output Summary (Last 24 hours) at 04/29/2024 1702 Last data filed at 04/29/2024 0900 Gross per 24 hour  Intake 448.8 ml  Output 350 ml  Net 98.8 ml   Filed Weights   04/24/24 1426  Weight: 122.5 kg    Exam:  General:  Appears calm and comfortable and is in NAD, up in chair Eyes:  normal lids, iris ENT:  grossly normal hearing, lips & tongue, mmm Cardiovascular:  RRR. No LE edema.  Respiratory:   CTA bilaterally with no wheezes/rales/rhonchi.  Normal respiratory effort. Abdomen:  soft, NT, ND; dressing is C/D/I; ostomy retracted and bag is recently changed and without significant drainage Skin:  no rash or induration seen on limited exam Musculoskeletal:  grossly normal tone BUE/BLE, good ROM, no bony abnormality Psychiatric:  grossly normal mood and affect, speech fluent and appropriate, AOx3 Neurologic:  CN 2-12 grossly intact, moves all extremities in  coordinated fashion  Data Reviewed: I have reviewed the patient's lab results since admission.  Pertinent labs for today include:     Glucose 188 Albumin  3.2 Normal CBC   Family Communication: Daughter was present  Mobility: PT/OT Consulted and are recommending - Home Health Pt12/11/2023 1458    Code Status: Full Code   Disposition: Status is: Inpatient Remains inpatient appropriate because: ongoing post-operative management     Time spent: 50 minutes  Unresulted Labs (From admission, onward)     Start     Ordered   04/29/24 2200  Heparin  level (unfractionated)  Once-Timed,   TIMED        04/29/24 1529   04/29/24 0500  Heparin  level (unfractionated)  Daily,   R      04/28/24 0309   04/27/24 0500  Comprehensive metabolic panel  Daily,   R      04/26/24 1704   04/26/24 0930  CBC  Daily,   R      04/26/24 0138             Author: Delon Herald, MD 04/29/2024 5:02 PM  For on call review [email or web address].

## 2024-04-30 ENCOUNTER — Encounter (HOSPITAL_COMMUNITY): Payer: Self-pay | Admitting: Internal Medicine

## 2024-04-30 DIAGNOSIS — E785 Hyperlipidemia, unspecified: Secondary | ICD-10-CM | POA: Diagnosis present

## 2024-04-30 LAB — COMPREHENSIVE METABOLIC PANEL WITH GFR
ALT: 6 U/L (ref 0–44)
AST: <10 U/L — ABNORMAL LOW (ref 15–41)
Albumin: 3.1 g/dL — ABNORMAL LOW (ref 3.5–5.0)
Alkaline Phosphatase: 78 U/L (ref 38–126)
Anion gap: 8 (ref 5–15)
BUN: 8 mg/dL (ref 8–23)
CO2: 28 mmol/L (ref 22–32)
Calcium: 9.6 mg/dL (ref 8.9–10.3)
Chloride: 101 mmol/L (ref 98–111)
Creatinine, Ser: 0.64 mg/dL (ref 0.44–1.00)
GFR, Estimated: >60 mL/min (ref >60)
Glucose, Bld: 174 mg/dL — ABNORMAL HIGH (ref 70–99)
Potassium: 3.8 mmol/L (ref 3.5–5.1)
Sodium: 136 mmol/L (ref 135–145)
Total Bilirubin: 0.4 mg/dL (ref 0.0–1.2)
Total Protein: 7 g/dL (ref 6.5–8.1)

## 2024-04-30 LAB — CBC
HCT: 39.2 % (ref 36.0–46.0)
Hemoglobin: 12.7 g/dL (ref 12.0–15.0)
MCH: 31.7 pg (ref 26.0–34.0)
MCHC: 32.4 g/dL (ref 30.0–36.0)
MCV: 97.8 fL (ref 80.0–100.0)
Platelets: 205 K/uL (ref 150–400)
RBC: 4.01 MIL/uL (ref 3.87–5.11)
RDW: 13.2 % (ref 11.5–15.5)
WBC: 7.7 K/uL (ref 4.0–10.5)
nRBC: 0 % (ref 0.0–0.2)

## 2024-04-30 LAB — NGS JAK2 E12-15/CALR/MPL

## 2024-04-30 LAB — HEPARIN LEVEL (UNFRACTIONATED): Heparin Unfractionated: 0.62 [IU]/mL (ref 0.30–0.70)

## 2024-04-30 LAB — GLUCOSE, CAPILLARY
Glucose-Capillary: 166 mg/dL — ABNORMAL HIGH (ref 70–99)
Glucose-Capillary: 167 mg/dL — ABNORMAL HIGH (ref 70–99)
Glucose-Capillary: 227 mg/dL — ABNORMAL HIGH (ref 70–99)
Glucose-Capillary: 208 mg/dL — ABNORMAL HIGH (ref 70–99)

## 2024-04-30 MED ORDER — LIDOCAINE 5 % EX PTCH
1.0000 | MEDICATED_PATCH | CUTANEOUS | Status: DC
  Administered 2024-05-01: 1 via TRANSDERMAL
  Filled 2024-04-30 (×5): qty 1

## 2024-04-30 MED ORDER — AMLODIPINE BESYLATE 5 MG PO TABS
5.0000 mg | ORAL_TABLET | Freq: Every day | ORAL | Status: DC
  Administered 2024-04-30 – 2024-05-06 (×7): 5 mg via ORAL
  Filled 2024-04-30 (×7): qty 1

## 2024-04-30 MED ORDER — BOOST PLUS PO LIQD
237.0000 mL | Freq: Two times a day (BID) | ORAL | Status: DC
  Administered 2024-05-01 – 2024-05-02 (×2): 237 mL via ORAL
  Filled 2024-04-30 (×9): qty 237

## 2024-04-30 MED ORDER — ATORVASTATIN CALCIUM 20 MG PO TABS
20.0000 mg | ORAL_TABLET | Freq: Every day | ORAL | Status: DC
  Administered 2024-04-30 – 2024-05-06 (×7): 20 mg via ORAL
  Filled 2024-04-30 (×7): qty 1

## 2024-04-30 MED ORDER — LOSARTAN POTASSIUM 50 MG PO TABS
100.0000 mg | ORAL_TABLET | Freq: Every day | ORAL | Status: DC
  Administered 2024-04-30 – 2024-05-06 (×7): 100 mg via ORAL
  Filled 2024-04-30 (×7): qty 2

## 2024-04-30 NOTE — Assessment & Plan Note (Signed)
 Hemoglobin A1c 7.0, good control Placed on sensitive sliding scale insulin  every 4 hours

## 2024-04-30 NOTE — Assessment & Plan Note (Signed)
 Reported chest pain prior to procedure Reports that this pain is usually present when she is hungry and resolves with drinking water Given IV Pepcid ; will give magic mouthwash and start daily PPI Negative EKG Cleared for surgery No further pain reported and she is delighted to be advancing her diet

## 2024-04-30 NOTE — Assessment & Plan Note (Signed)
 CT11/23 showed portal venous thrombosis  Patient was discharged on Eliquis  Hold Eliquis  for now, on IV heparin  for anticoagulation Resume Eliquis  at time of dc, per Dr. Timmy

## 2024-04-30 NOTE — Assessment & Plan Note (Addendum)
 2 recent hospital admissions for this problem, treated with antibiotics Repeat CT now shows severe sigmoid diverticulosis with perforated diverticulitis demonstrating progressive phlegmon in the distal sigmoid mesentery and extraluminal gas with septic thrombophlebitis involving peripheral branches of the inferior mesenteric vein WBC count 10.5, no signs of sepsis General Surgery consulted Continue Zosyn  Underwent ex lap with sigmoid colectomy with ostomy creation 12/5 Transitioned to PO/IV medications and off PCA on 12/8 Needs PT evaluation and to increase mobility Patient reports that she cannot hold her abdomen and use both hands effectively to stand; discussed with surgery and added abdominal binder for support Reported significant pain this AM but this was immediately after she had been up to the bedside commode and she appeared to improve with rest

## 2024-04-30 NOTE — Assessment & Plan Note (Addendum)
 Patient was not hypoxic on arrival to the ED but later desatted to 87% on room air after receiving IV opiates for pain Currently satting well on RA  Echo done 03/22/2024 showing EF 55 to 60% and grade 1 diastolic dysfunction; no signs of volume overload at this time Triple neb therapy was previously prescribed at home but her nebulizer is broken and she has only been using Albuterol  HFA prn Stopped standing BID nebulizer treatments other than prn albuterol  without apparent detriment thus far

## 2024-04-30 NOTE — Assessment & Plan Note (Addendum)
 Body mass index is 39.87 kg/m.SABRA  Weight loss should be encouraged Outpatient PCP/bariatric medicine f/u encouraged Significantly low or high BMI is associated with higher medical risk including morbidity and mortality

## 2024-04-30 NOTE — Plan of Care (Signed)
  Problem: Education: Goal: Ability to describe self-care measures that may prevent or decrease complications (Diabetes Survival Skills Education) will improve Outcome: Progressing   Problem: Coping: Goal: Ability to adjust to condition or change in health will improve Outcome: Progressing   Problem: Health Behavior/Discharge Planning: Goal: Ability to manage health-related needs will improve Outcome: Progressing   Problem: Skin Integrity: Goal: Risk for impaired skin integrity will decrease Outcome: Progressing   Problem: Tissue Perfusion: Goal: Adequacy of tissue perfusion will improve Outcome: Progressing   Problem: Education: Goal: Knowledge of General Education information will improve Description: Including pain rating scale, medication(s)/side effects and non-pharmacologic comfort measures Outcome: Progressing   Problem: Health Behavior/Discharge Planning: Goal: Ability to manage health-related needs will improve Outcome: Progressing   Problem: Clinical Measurements: Goal: Respiratory complications will improve Outcome: Progressing   Problem: Activity: Goal: Risk for activity intolerance will decrease Outcome: Progressing   Problem: Nutrition: Goal: Adequate nutrition will be maintained Outcome: Progressing   Problem: Elimination: Goal: Will not experience complications related to urinary retention Outcome: Progressing   Problem: Pain Managment: Goal: General experience of comfort will improve and/or be controlled Outcome: Progressing   Problem: Safety: Goal: Ability to remain free from injury will improve Outcome: Progressing   Problem: Skin Integrity: Goal: Risk for impaired skin integrity will decrease Outcome: Progressing

## 2024-04-30 NOTE — Progress Notes (Signed)
 PHARMACY - ANTICOAGULATION CONSULT NOTE  Pharmacy Consult for heparin  drip  Indication: hx recent portal vein thrombosis, prothrombin II gene mutation, PTA Eliquis  on hold  Allergies  Allergen Reactions   Bee Venom Anaphylaxis and Hives   Empagliflozin Itching, Rash and Dermatitis   Ibuprofen Nausea And Vomiting   Statins Nausea Only and Other (See Comments)    Muscle spasms and cramps, too    Patient Measurements: Height: 5' 9 (175.3 cm) Weight: 122.5 kg (270 lb) IBW/kg (Calculated) : 66.2 HEPARIN  DW (KG): 94.7  Vital Signs: Temp: 98.1 F (36.7 C) (12/09 0531) Temp Source: Oral (12/09 0531) BP: 162/71 (12/09 0531) Pulse Rate: 71 (12/09 0531)  Labs: Recent Labs    04/28/24 0520 04/28/24 0937 04/28/24 1648 04/29/24 0458 04/29/24 1419 04/29/24 2143 04/30/24 0522  HGB 12.8  --   --  12.3  --   --  12.7  HCT 39.8  --   --  39.4  --   --  39.2  PLT 203  --   --  199  --   --  205  APTT  --  58* 78* 89*  --   --   --   HEPARINUNFRC  --  0.67 0.72* 0.72* 0.44 0.58 0.62  CREATININE 0.63  --   --  0.66  --   --  0.64    Estimated Creatinine Clearance: 100.8 mL/min (by C-G formula based on SCr of 0.64 mg/dL).   Medical History: Past Medical History:  Diagnosis Date   Asthma    Cancer (HCC)    Cannabis abuse    Chronic pain    Diabetes mellitus type 2, controlled (HCC)    Hypertension     Medications:  - on Eliquis  5mg  bid PTA (last dose taken on 04/24/24)  Assessment: Lisa Ibarra is a 63 y.o. female who was recently hospitalized from 04/10/24 to 04/17/24 for acute complicated sigmoid diverticulitis with microperforation/possible abscess and portal vein thrombosis.  She was disharged with Eliquis  dose pack for portal vein thrombosis and oral abx for diverticulitis.  She presented back to the ED on 04/24/2024 with c/o abdominal pain and subsequently underwent sigmoid colectomy and creation of colostomy on 04/26/24.  Heparin  drip started on admission on 04/25/24, held  for OR on 04/26/24 and resumed 04/26/24 post-op (NO BOLUS per MD's request).  Significant events:  - 12/6: pt's RN documented at 1:30 PM that pt had a coughing fit and blood saturated her abdominal wound covering. Bandaged changed - 12/7:  Third shift RN discovered around ~1 AM on 04/28/24 that heparin  drip was stopped at noon on 04/27/24 by day shift RN.  There was no documentation as to why this was held and whether pt's RN received order from a provider to hold heparin  drip. Heparin  drip resumed back at ~1:47AM on 12/7.   Today, 04/30/2024: - Heparin  level 0.62 = therapeutic on infusion running at 1600 units/hr.  - CBC stable  - No bleeding or infusion complications noted    Goal of Therapy:  Heparin  level 0.3-0.7 units/ml aPTT 66-102 seconds Monitor platelets by anticoagulation protocol: Yes   Plan:  - Continue heparin  drip at 1600 units/hr - Monitor daily heparin  level and CBC  - Continue to monitor for signs/symptoms of bleeding  - Follow up ability to transition back to PTA Apixaban   Thank you for allowing pharmacy to be a part of this patient's care.  Marget Hench, PharmD Clinical Pharmacist 04/30/2024 7:44 AM

## 2024-04-30 NOTE — Assessment & Plan Note (Signed)
 Resume atorvastatin  (although it is not clear that she was taking this as prescribed PTA)

## 2024-04-30 NOTE — Assessment & Plan Note (Addendum)
 Reported SI 12/7, but contracts for safety She has had irritability and this is likely related Some of it is situational, as she was told by her work that she is being let go with termination of benefits as of 12/12 She did request psychiatry consult and is open to medications She contracts for safety at this time and does not appear to need a sitter or IVC Psychiatry is signed off, added prn hydroxyzine 

## 2024-04-30 NOTE — Plan of Care (Signed)

## 2024-04-30 NOTE — Assessment & Plan Note (Addendum)
 BP currently 156/81 Continue metoprolol  and hydralazine  Resume amlodipine  and losartan , given suboptimal BP control

## 2024-04-30 NOTE — Progress Notes (Signed)
 4 Days Post-Op  Subjective: Patient reports pain control is about the same. Oxycodone  does seem to help when she gets it. Not sure if the robaxin  is making much difference. She is tolerating a soft diet without nausea or vomiting. Having gas from ostomy but no stool output yet. WOC planning to do some teaching with daughter today. She plans to walk in the halls with her daughter today.   Objective: Vital signs in last 24 hours: Temp:  [98.1 F (36.7 C)-98.2 F (36.8 C)] 98.1 F (36.7 C) (12/09 0531) Pulse Rate:  [71-81] 71 (12/09 0531) Resp:  [15-20] 20 (12/09 0531) BP: (151-162)/(71-79) 162/71 (12/09 0531) SpO2:  [95 %-98 %] 98 % (12/09 0531) FiO2 (%):  [28 %] 28 % (12/08 1118) Last BM Date : 04/25/24  Intake/Output from previous day: 12/08 0701 - 12/09 0700 In: 765.7 [P.O.:360; I.V.:297.8; IV Piggyback:107.8] Out: 150 [Urine:150] Intake/Output this shift: No intake/output data recorded.  PE: Abd: soft, appropriately tender, obese, ND, midline wound is clean with healthy appearing tissue and measures 16 x 7 x 7 cm, colostomy seems flush to the skin and is slightly dusky but no necrotic tissue, small amount gas present, no stool material yet  Lab Results:  Recent Labs    04/29/24 0458 04/30/24 0522  WBC 8.0 7.7  HGB 12.3 12.7  HCT 39.4 39.2  PLT 199 205   BMET Recent Labs    04/29/24 0458 04/30/24 0522  NA 136 136  K 3.8 3.8  CL 102 101  CO2 27 28  GLUCOSE 188* 174*  BUN 8 8  CREATININE 0.66 0.64  CALCIUM  9.6 9.6   PT/INR No results for input(s): LABPROT, INR in the last 72 hours. CMP     Component Value Date/Time   NA 136 04/30/2024 0522   K 3.8 04/30/2024 0522   CL 101 04/30/2024 0522   CO2 28 04/30/2024 0522   GLUCOSE 174 (H) 04/30/2024 0522   BUN 8 04/30/2024 0522   CREATININE 0.64 04/30/2024 0522   CALCIUM  9.6 04/30/2024 0522   PROT 7.0 04/30/2024 0522   ALBUMIN  3.1 (L) 04/30/2024 0522   AST <10 (L) 04/30/2024 0522   ALT 6 04/30/2024  0522   ALKPHOS 78 04/30/2024 0522   BILITOT 0.4 04/30/2024 0522   GFRNONAA >60 04/30/2024 0522   GFRAA >60 04/30/2019 0918   Lipase     Component Value Date/Time   LIPASE 18 04/24/2024 1444       Studies/Results: No results found.  Anti-infectives: Anti-infectives (From admission, onward)    Start     Dose/Rate Route Frequency Ordered Stop   04/25/24 0300  piperacillin -tazobactam (ZOSYN ) IVPB 3.375 g        3.375 g 12.5 mL/hr over 240 Minutes Intravenous Every 8 hours 04/24/24 2338 05/01/24 0819   04/24/24 2100  piperacillin -tazobactam (ZOSYN ) IVPB 3.375 g        3.375 g 100 mL/hr over 30 Minutes Intravenous  Once 04/24/24 2053 04/24/24 2142        Assessment/Plan POD 4, s/p  Hartmann's procedure by Dr. Rubin 12/5 for diverticulitis - continue scheduled tylenol  and robaxin  and oxycodone  PRN for pain control, IV dilaudid  for breakthrough pain. Added lidocaine  patch today as well. If pain still an issue can consider adding gabapentin  vs tramadol  vs increased oxy scale - continue soft diet  - WOC following, will need referral to ostomy clinic  - mobilize and ambulate as able, PT following - continue BID dressing changes to midline  wound - zosyn  in place with an end date in place as well - labs and vitals stable  FEN - soft VTE - heparin  gtt - would await bowel function prior to switching back to DOAC ID - zosyn   - per TR[facility] -  Portal vein thrombosis CHFpEF COPD HTN Obesity DM    LOS: 6 days    Burnard JONELLE Louder , [facility] Surgery 04/30/2024, 9:23 AM Please see Amion for pager number during day hours 7:00am-4:30pm or 7:00am -11:30am on weekends

## 2024-04-30 NOTE — Progress Notes (Signed)
 Progress Note   Patient: Lisa Ibarra FMW:[id number] DOB: 1961-02-19 DOA: 04/24/2024     6 DOS: the patient was seen and examined on 04/30/2024   Brief hospital course: 63yo with h/o COPD, chronic HFpEF, HTN, HLD, T2DM, chronic pain syndrome, class 2 obesity, and tobacco and marijuana use who presented on 12/3 with abdominal pain. She was previously admitted from 11/3-7 for acute diverticulitis with bacteremia with Pseudomonas and E coli. She returned from 11/19-26 with diverticulitis with microperforation and possible abscess that was treated with antibiotics; she was also found to have a portal vein thrombosis and started on Eliquis . She returned to the ER due to persistent abdominal pain, having run out of antibiotics 3 days PTA. 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 She was treated with Dilaudid , Zofran , and Zosyn  and surgery was consulted. Ex lap on 12/5 for perforated diverticulitis s/p ostomy creation.   Assessment & Plan Acute diverticulitis 2 recent hospital admissions for this problem, treated with antibiotics Repeat CT now shows severe sigmoid diverticulosis with perforated diverticulitis demonstrating progressive phlegmon in the distal sigmoid mesentery and extraluminal gas with septic thrombophlebitis involving peripheral branches of the inferior mesenteric vein WBC count 10.5, no signs of sepsis General Surgery consulted Continue Zosyn  Underwent ex lap with sigmoid colectomy with ostomy creation 12/5 Transitioned to PO/IV medications and off PCA on 12/8 Needs PT evaluation and to increase mobility Patient reports that she cannot hold her abdomen and use both hands effectively to stand; discussed with surgery and added abdominal binder for support Reported significant pain this AM but this was immediately after she  had been up to the bedside commode and she appeared to improve with rest Mood disorder Reported SI 12/7, but contracts for safety She has had irritability and this is likely related Some of it is situational, as she was told by her work that she is being let go with termination of benefits as of 12/12 She did request psychiatry consult and is open to medications She contracts for safety at this time and does not appear to need a sitter or IVC Psychiatry is signed off, added prn hydroxyzine  Non-cardiac chest pain Reported chest pain prior to procedure Reports that this pain is usually present when she is hungry and resolves with drinking water Given IV Pepcid ; will give magic mouthwash and start daily PPI Negative EKG Cleared for surgery No further pain reported and she is delighted to be advancing her diet Chronic heart failure with preserved ejection fraction (HFpEF) (HCC) Hypoxemia COPD (chronic obstructive pulmonary disease) (HCC) Patient was not hypoxic on arrival to the ED but later desatted to 87% on room air after receiving IV opiates for pain Currently satting well on RA  Echo done 03/22/2024 showing EF 55 to 60% and grade 1 diastolic dysfunction; no signs of volume overload at this time Triple neb therapy was previously prescribed at home but her nebulizer is broken and she has only been using Albuterol  HFA prn Stopped standing BID nebulizer treatments other than prn albuterol  without apparent detriment thus far Diabetes mellitus (HCC) Hemoglobin A1c 7.0, good control Placed on sensitive sliding scale insulin  every 4 hours 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  Patient was discharged on Eliquis  Hold Eliquis  for now, on IV heparin  for anticoagulation Resume Eliquis  at time of dc, per Dr. Timmy Essential hypertension BP currently 156/81 Continue metoprolol  and hydralazine   Resume amlodipine  and losartan , given suboptimal BP  control Dyslipidemia Resume atorvastatin  (although it is not clear that she was taking this as prescribed PTA) Class 2 obesity due to excess calories with body mass index (BMI) of 39.0 to 39.9 in adult Body mass index is 39.87 kg/m.SABRA  Weight loss should be encouraged Outpatient PCP/bariatric medicine f/u encouraged Significantly low or high BMI is associated with higher medical risk including morbidity and mortality       Consultants: Surgery Oncology Psychiatry PT   Procedures: Ex lap with ostomy creation 12/5   Antibiotics: Zosyn  12/3-    30 Day Unplanned Readmission Risk Score    Flowsheet Row ED to Hosp-Admission (Current) from 04/24/2024 in [facility] 4TH FLOOR PROGRESSIVE CARE AND UROLOGY  30 Day Unplanned Readmission Risk Score (%) 23.29 Filed at 04/30/2024 0400    This score is the patient's risk of an unplanned readmission within 30 days of being discharged (0 -100%). The score is based on dignosis, age, lab data, medications, orders, and past utilization.   Low:  0-14.9   Medium: 15-21.9   High: 22-29.9   Extreme: 30 and above           Subjective: Pain uncontrolled with movement but generally doing ok, progressing.  She did not find the abdominal binder to be helpful.   Objective: Vitals:   04/30/24 0531 04/30/24 1326  BP: (!) 162/71 (!) 156/81  Pulse: 71 78  Resp: 20 18  Temp: 98.1 F (36.7 C) 98.2 F (36.8 C)  SpO2: 98% 98%    Intake/Output Summary (Last 24 hours) at 04/30/2024 1600 Last data filed at 04/30/2024 0330 Gross per 24 hour  Intake 645.65 ml  Output 150 ml  Net 495.65 ml   Filed Weights   04/24/24 1426  Weight: 122.5 kg    Exam:  General:  Appears calm and comfortable and is in NAD Eyes:  normal lids, iris ENT:  grossly normal hearing, lips & tongue, mmm Cardiovascular:  RRR. No LE edema.  Respiratory:   CTA bilaterally with no wheezes/rales/rhonchi.  Normal respiratory effort. Abdomen:  soft, appropriately tender;  bandage is C/D/I; ostomy retracted without frank stool output Skin:  no rash or induration seen on limited exam Musculoskeletal:  grossly normal tone BUE/BLE, good ROM, no bony abnormality Psychiatric:  grossly normal mood and affect, speech fluent and appropriate, AOx3 Neurologic:  CN 2-12 grossly intact, moves all extremities in coordinated fashion  Data Reviewed: I have reviewed the patient's lab results since admission.  Pertinent labs for today include:   Glucose 174 Albumin  3.1 Normal CBC     Family Communication: None present  Mobility: PT/OT Consulted and are recommending - Home Health Pt12/11/2023 1458    Code Status: Full Code    Disposition: Status is: Inpatient Remains inpatient appropriate because: ongoing management     Time spent: 50 minutes  Unresulted Labs (From admission, onward)     Start     Ordered   04/30/24 0500  Heparin  level (unfractionated)  Daily,   R      04/29/24 2252   04/26/24 0930  CBC  Daily,   R      04/26/24 0138             Author: Delon Herald, MD 04/30/2024 4:00 PM  For on call review [email or web address].

## 2024-04-30 NOTE — Consult Note (Signed)
 WOC Nurse ostomy follow up Attempted to see this morning at 10:30 as previously planned yesterday, but patient stated her daughter would not be available until 11:15.  Returned at 11:15 and patient is distressed and told me her daughter is having car trouble and will not be able to come in today for a teaching session. I reassured her this is no problem, and my partner Sherrilyn will check with her tomorrow morning to see what time will work best for them. Educational materials and supplies at the bedside.  Current pouch is intact with good seal, no stool.  Pt states it was changed by the staff nurse earlier this morning.   WOC team will see tomorrow morning as requested. Thank-you,  Stephane Fought MSN, RN, CWOCN, CWCN-AP, CNS Contact Mon-Fri 0700-1500: [phone]

## 2024-05-01 LAB — CBC
HCT: 37.9 % (ref 36.0–46.0)
Hemoglobin: 12 g/dL (ref 12.0–15.0)
MCH: 31.1 pg (ref 26.0–34.0)
MCHC: 31.7 g/dL (ref 30.0–36.0)
MCV: 98.2 fL (ref 80.0–100.0)
Platelets: 194 K/uL (ref 150–400)
RBC: 3.86 MIL/uL — ABNORMAL LOW (ref 3.87–5.11)
RDW: 13.4 % (ref 11.5–15.5)
WBC: 7.7 K/uL (ref 4.0–10.5)
nRBC: 0 % (ref 0.0–0.2)

## 2024-05-01 LAB — GLUCOSE, CAPILLARY
Glucose-Capillary: 154 mg/dL — ABNORMAL HIGH (ref 70–99)
Glucose-Capillary: 173 mg/dL — ABNORMAL HIGH (ref 70–99)
Glucose-Capillary: 195 mg/dL — ABNORMAL HIGH (ref 70–99)
Glucose-Capillary: 279 mg/dL — ABNORMAL HIGH (ref 70–99)

## 2024-05-01 LAB — HEPARIN LEVEL (UNFRACTIONATED): Heparin Unfractionated: 0.39 [IU]/mL (ref 0.30–0.70)

## 2024-05-01 MED ORDER — IPRATROPIUM-ALBUTEROL 0.5-2.5 (3) MG/3ML IN SOLN: 3.0000 mL | RESPIRATORY_TRACT | Status: DC | PRN

## 2024-05-01 MED ORDER — METOPROLOL TARTRATE 5 MG/5ML IV SOLN: 5.0000 mg | INTRAVENOUS | Status: DC | PRN

## 2024-05-01 MED ORDER — HYDRALAZINE HCL 20 MG/ML IJ SOLN: 10.0000 mg | INTRAMUSCULAR | Status: DC | PRN

## 2024-05-01 MED ORDER — GLUCAGON HCL RDNA (DIAGNOSTIC) 1 MG IJ SOLR: 1.0000 mg | INTRAMUSCULAR | Status: DC | PRN

## 2024-05-01 MED ORDER — GABAPENTIN 100 MG PO CAPS
200.0000 mg | ORAL_CAPSULE | Freq: Two times a day (BID) | ORAL | Status: DC
  Administered 2024-05-01 – 2024-05-02 (×3): 200 mg via ORAL
  Filled 2024-05-01 (×3): qty 2

## 2024-05-01 MED ORDER — POLYETHYLENE GLYCOL 3350 17 G PO PACK
17.0000 g | PACK | Freq: Every day | ORAL | Status: DC | PRN
  Administered 2024-05-01: 17 g via ORAL
  Filled 2024-05-01: qty 1

## 2024-05-01 MED ORDER — TRAMADOL HCL 50 MG PO TABS
50.0000 mg | ORAL_TABLET | Freq: Three times a day (TID) | ORAL | Status: DC
  Administered 2024-05-01 – 2024-05-06 (×16): 50 mg via ORAL
  Filled 2024-05-01 (×16): qty 1

## 2024-05-01 NOTE — Progress Notes (Signed)
 PT Cancellation/Sign off Note  Patient Details Name: Lisa Ibarra MRN: [id number] DOB: [birth year]   Cancelled Treatment:    Reason Eval/Treat Not Completed:  Attempted PT tx sesson-pt declines PT-reports she plans to walk with her daughter later today when she comes for ostomy teaching. Pt reports she prefers to continue her walks with her daughter. She has a walker at home already. She plans to resume HHPT after discharge. Will sign off at pt's request.     Dannial SQUIBB, PT Acute Rehabilitation  Office: [phone]

## 2024-05-01 NOTE — Progress Notes (Addendum)
 PHARMACY - ANTICOAGULATION CONSULT NOTE  Pharmacy Consult for heparin  drip  Indication: hx recent portal vein thrombosis, prothrombin II gene mutation, PTA Eliquis  on hold  Allergies  Allergen Reactions   Bee Venom Anaphylaxis and Hives   Empagliflozin Itching, Rash and Dermatitis   Ibuprofen Nausea And Vomiting   Statins Nausea Only and Other (See Comments)    Muscle spasms and cramps, too    Patient Measurements: Height: 5' 9 (175.3 cm) Weight: 122.5 kg (270 lb) IBW/kg (Calculated) : 66.2 HEPARIN  DW (KG): 94.7  Vital Signs: Temp: 98.6 F (37 C) (12/10 0514) Temp Source: Oral (12/09 2030) BP: 152/82 (12/10 0514) Pulse Rate: 73 (12/10 0514)  Labs: Recent Labs    04/28/24 0937 04/28/24 1648 04/28/24 1648 04/29/24 0458 04/29/24 1419 04/29/24 2143 04/30/24 0522 05/01/24 0504  HGB  --   --    < > 12.3  --   --  12.7 12.0  HCT  --   --   --  39.4  --   --  39.2 37.9  PLT  --   --   --  199  --   --  205 194  APTT 58* 78*  --  89*  --   --   --   --   HEPARINUNFRC 0.67 0.72*  --  0.72*   < > 0.58 0.62 0.39  CREATININE  --   --   --  0.66  --   --  0.64  --    < > = values in this interval not displayed.    Estimated Creatinine Clearance: 100.8 mL/min (by C-G formula based on SCr of 0.64 mg/dL).   Medical History: Past Medical History:  Diagnosis Date   Asthma    Cancer (HCC)    Cannabis abuse    Chronic pain    Diabetes mellitus type 2, controlled (HCC)    Dyslipidemia 04/30/2024   Hypertension     Medications:  - on Eliquis  5mg  bid PTA (last dose taken on 04/24/24)  Assessment: Lisa Ibarra is a 63 y.o. female who was recently hospitalized from 04/10/24 to 04/17/24 for acute complicated sigmoid diverticulitis with microperforation/possible abscess and portal vein thrombosis.  She was disharged with Eliquis  dose pack for portal vein thrombosis and oral abx for diverticulitis.  She presented back to the ED on 04/24/2024 with c/o abdominal pain and  subsequently underwent sigmoid colectomy and creation of colostomy on 04/26/24.  Heparin  drip started on admission on 04/25/24, held for OR on 04/26/24 and resumed 04/26/24 post-op (NO BOLUS per MD's request).  Significant events:  - 12/6: pt's RN documented at 1:30 PM that pt had a coughing fit and blood saturated her abdominal wound covering. Bandaged changed - 12/7:  Third shift RN discovered around ~1 AM on 04/28/24 that heparin  drip was stopped at noon on 04/27/24 by day shift RN.  There was no documentation as to why this was held and whether pt's RN received order from a provider to hold heparin  drip. Heparin  drip resumed back at ~1:47AM on 12/7.   Today, 05/01/2024: - Heparin  level 0.36 = noted drop from yesterday, but remains therapeutic on infusion running at 1600 units/hr.  - CBC remains stable  - No bleeding or infusion complications noted per RN   Goal of Therapy:  Heparin  level 0.3-0.7 units/ml aPTT 66-102 seconds Monitor platelets by anticoagulation protocol: Yes   Plan:  - Continue heparin  drip at 1600 units/hr - Monitor daily heparin  level and CBC  -  Continue to monitor for signs/symptoms of bleeding  - Follow up ability to transition back to PTA Apixaban   Thank you for allowing pharmacy to be a part of this patients care.  Marget Hench, PharmD Clinical Pharmacist 05/01/2024 7:13 AM

## 2024-05-01 NOTE — Consult Note (Signed)
 WOC Nurse ostomy follow up, teaching session #2 Stoma type/location:  LUQ Appliance leaking on side, no barrier ring was noted Stomal assessment/size: 28 mm; moist, viable, at skin level, oblong shape Peristomal assessment: intact, no breakdown, crease at 9 o'clock Treatment options for stomal/peristomal skin:  Output none Ostomy pouching: 1pc soft convex #[id number], barrier ring [id number]  Education provided:  Daughter at bedside, she took notes on her phone, reviewed the following; skin care, how to cut appliance, activation of wafer adhesive, bathing, hydration/dietary needs, frequency of change, leakage issues that can occur, need for barrier ring and how to fill in crease, what foods may help reduce odor, outpatient ostomy clinic sheet for potential skin issues and educational book at bedside, answered the daughter's questions, found the education helpful. Pointed out that [employer] has educational videos she can watch anytime; QR codes provided, daughter stated I will educate myself on this, recommend staff have extra supplies at the bedside. Patient was engaged and calm, used elements of positive psychology during teaching session. Mid-abd surgical wound covered with gauze and patient understands that the primary nurse will be doing dressing today. Enrolled patient in [place] Secure Start Discharge program: Yes on 12/08  Patient may benefit from home care nursing services upon discharge.   Sherrilyn Hals MSN RN CWOCN WOC [facility]  [phone] (Available from 7-3 pm Mon-Friday)

## 2024-05-01 NOTE — Progress Notes (Signed)
 Education provided with regard to continued wound care. Dressing changed. Patient has manipulated dressing x2 exposing wound bed near colostomy. Dressing reinforced. She was encouraged to cease this activity to preserve dressing and decrease risk of infection.

## 2024-05-01 NOTE — Progress Notes (Signed)
 Lisa Ibarra is about the same.  She is walking.  She says it is gas bubble that is bothering her.  She has not yet had a bowel movement.  She continues on heparin .  Again I will continue on the heparin  until she is ready to go home.  There is been no bleeding.  I saw the pathology.  There is no evidence of malignancy on the pathology which is encouraging.  She is eating.  She does not have any nausea or vomiting.  Her labs today show white cell count of 7.7.  Hemoglobin 12.  Platelet count 194,000.  She has had no cough.  No chest pain.  She has had no mouth sores.  Her vital signs show a temperature of 98.6.  Pulse 73.  Blood pressure 152/82.  Head and neck exam shows no ocular or oral lesions.  She has no palpable cervical or supraclavicular lymph nodes.  Lungs are clear bilaterally.  Cardiac exam regular rate and rhythm.  She has no murmurs.  Abdomen is soft.  She has a ostomy which is intact.  Surgical scars are healing.  There is no abdominal distention.  Bowel sounds are present but may be slightly decreased.  Extremity shows no clubbing, cyanosis or edema.  She has good range of motion of her joints.  She has good strength in upper and lower extremities.  Skin exam shows no rashes, ecchymosis or petechia.  Lisa Ibarra is a very nice 63 year old Afro-American woman.  She has a portal vein thrombus.  This is why we are seeing her.  She does have the Prothrombin II gene mutation.  She also has a lupus anticoagulant.  I suspect that the lupus anticoagulant probably is transient.  She will need long-term anticoagulation.  I believe that the heparin  is doing a good job for her.  Hopefully, she will be able to go home soon.  I suspect that her team is waiting for her to have a bowel movement.   Jeralyn Crease, MD  Psalm 147:11

## 2024-05-01 NOTE — Plan of Care (Signed)
°  Problem: Fluid Volume: Goal: Ability to maintain a balanced intake and output will improve Outcome: Progressing   Problem: Health Behavior/Discharge Planning: Goal: Ability to manage health-related needs will improve Outcome: Progressing   Problem: Metabolic: Goal: Ability to maintain appropriate glucose levels will improve Outcome: Progressing   Problem: Skin Integrity: Goal: Risk for impaired skin integrity will decrease Outcome: Progressing   Problem: Education: Goal: Knowledge of General Education information will improve Description: Including pain rating scale, medication(s)/side effects and non-pharmacologic comfort measures Outcome: Progressing   Problem: Clinical Measurements: Goal: Will remain free from infection Outcome: Progressing   Problem: Activity: Goal: Risk for activity intolerance will decrease Outcome: Progressing   Problem: Coping: Goal: Level of anxiety will decrease Outcome: Progressing   Problem: Elimination: Goal: Will not experience complications related to bowel motility Outcome: Progressing   Problem: Elimination: Goal: Will not experience complications related to urinary retention Outcome: Progressing   Problem: Safety: Goal: Ability to remain free from injury will improve Outcome: Progressing

## 2024-05-01 NOTE — Progress Notes (Signed)
 PROGRESS NOTE    Lisa Ibarra  FMW:[id number] DOB: 10-Nov-1960 DOA: 04/24/2024 PCP: Patient, No Pcp Per    Brief Narrative:   63yo with h/o COPD, chronic HFpEF, HTN, HLD, T2DM, chronic pain syndrome, class 2 obesity, and tobacco and marijuana use who presented on 12/3 with abdominal pain. She was previously admitted from 11/3-7 for acute diverticulitis with bacteremia with Pseudomonas and E coli. She returned from 11/19-26 with diverticulitis with microperforation and possible abscess that was treated with antibiotics; she was also found to have a portal vein thrombosis and started on Eliquis . She returned to the ER due to persistent abdominal pain, having run out of antibiotics 3 days PTA. 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 She was treated with Dilaudid , Zofran , and Zosyn  and surgery was consulted. Ex lap on 12/5 for perforated diverticulitis s/p ostomy creation.   Assessment & Plan:  Acute diverticulitis, complicated by perforation Status post ostomy 12/5 Unfortunately patient had developed perforation and therefore general surgery performed ex lap with sigmoid colectomy with ostomy creation on 12/5.  Postop management per their service and appears to be slowly improving  Mood disorder Reported SI 12/7, but contracts for safety.  Suspected situational in nature.  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  Will transition  Essential hypertension Continue metoprolol  and hydralazine  Resume amlodipine  and losartan , given suboptimal BP control  Dyslipidemia Resume atorvastatin  (although it is not clear that she was taking this as prescribed PTA)  Class 2 obesity due to excess calories with body mass index (BMI) of 39.0 to 39.9 in adult Body mass index is 39.87 kg/m.Lisa Ibarra  Weight loss should be encouraged Outpatient PCP/bariatric medicine f/u encouraged Significantly low or high BMI is associated with higher medical risk including morbidity and mortality      DVT prophylaxis: Heparin  drip    Code Status: Full Code Family Communication:   Status is: Inpatient Remains inpatient appropriate because: Continue hospital stay   PT Follow up Recs: Home Health Pt12/11/2023 1458  Subjective: Seen and examined at bedside still reporting of quite a bit of abdominal pain especially at the surgical site   Examination:  General exam: Appears calm and comfortable  Respiratory system: Clear to auscultation. Respiratory effort normal. Cardiovascular system: S1 & S2 heard, RRR. No JVD, murmurs, rubs, gallops or clicks. No pedal edema. Gastrointestinal system: Abdomen is nondistended, soft and nontender. No organomegaly or masses felt. Normal bowel sounds heard. Central nervous system: Alert and oriented. No focal neurological deficits. Extremities: Symmetric 5 x 5 power. Skin: Surgical site with ostomy noted.  Midline incision, dressing in place Psychiatry: Judgement and insight appear normal. Mood & affect appropriate.                Diet Orders (From admission, onward)     Start     Ordered   04/28/24 0738  DIET SOFT Room service appropriate? Yes;  Fluid consistency: Thin  Diet effective now       Question Answer Comment  Room  service appropriate? Yes   Fluid consistency: Thin      04/28/24 0738            Objective: Vitals:   04/30/24 1326 04/30/24 2030 05/01/24 0514 05/01/24 0916  BP: (!) 156/81 (!) 161/80 (!) 152/82 (!) 179/86  Pulse: 78 76 73 81  Resp: 18 19 19 16   Temp: 98.2 F (36.8 C) 98.2 F (36.8 C) 98.6 F (37 C) 98.8 F (37.1 C)  TempSrc: Oral Oral  Oral  SpO2: 98% 96% 94% 100%  Weight:      Height:        Intake/Output Summary (Last 24 hours) at 05/01/2024 1227 Last data filed at 05/01/2024 0810 Gross per 24 hour  Intake 708 ml  Output 0 ml  Net 708 ml   Filed Weights   04/24/24 1426  Weight: 122.5 kg    Scheduled Meds:  acetaminophen   1,000 mg Oral Q6H   amLODipine   5 mg Oral Daily   atorvastatin   20 mg Oral Daily   Chlorhexidine  Gluconate Cloth  6 each Topical Daily   gabapentin   200 mg Oral BID   hydrALAZINE   25 mg Oral Q8H   insulin  aspart  0-9 Units Subcutaneous TID WC   lactose free nutrition  237 mL Oral BID BM   lidocaine   1 patch Transdermal Q24H   losartan   100 mg Oral Daily   methocarbamol   1,000 mg Oral QID   metoprolol  succinate  100 mg Oral Daily   pantoprazole   40 mg Oral Daily   traMADol   50 mg Oral TID   Continuous Infusions:  heparin  1,600 Units/hr (05/01/24 1005)    Nutritional status     Body mass index is 39.87 kg/m.  Data Reviewed:   CBC: Recent Labs  Lab 04/27/24 0427 04/28/24 0520 04/29/24 0458 04/30/24 0522 05/01/24 0504  WBC 13.0* 9.9 8.0 7.7 7.7  HGB 14.7 12.8 12.3 12.7 12.0  HCT 44.4 39.8 39.4 39.2 37.9  MCV 96.7 98.0 99.7 97.8 98.2  PLT 173 203 199 205 194   Basic Metabolic Panel: Recent Labs  Lab 04/25/24 0516 04/27/24 0427 04/28/24 0520 04/29/24 0458 04/30/24 0522  NA 136 133* 134* 136 136  K 3.6 4.1 3.7 3.8 3.8  CL 102 98 99 102 101  CO2 26 21* 27 27 28   GLUCOSE 171* 225* 164* 188* 174*  BUN 5* 7* 8 8 8   CREATININE 0.64 0.65 0.63 0.66 0.64  CALCIUM  8.8* 9.2 9.7 9.6 9.6   GFR: Estimated Creatinine  Clearance: 100.8 mL/min (by C-G formula based on SCr of 0.64 mg/dL). Liver Function Tests: Recent Labs  Lab 04/24/24 1444 04/27/24 0427 04/28/24 0520 04/29/24 0458 04/30/24 0522  AST 12* 12* <10* <10* <10*  ALT 8 6 5 6 6   ALKPHOS 81 66 58 68 78  BILITOT 0.4 0.7 0.4 0.4 0.4  PROT 7.7 7.9 6.9 6.8 7.0  ALBUMIN  3.1* 3.3* 3.1* 3.2* 3.1*   Recent Labs  Lab 04/24/24 1444  LIPASE 18   No results for input(s): AMMONIA in the last 168 hours. Coagulation Profile: No results for input(s): INR, PROTIME in the last 168 hours. Cardiac Enzymes: No results for input(s): CKTOTAL, CKMB, CKMBINDEX, TROPONINI in the last 168 hours. BNP (last 3 results) Recent Labs    03/21/24 1310 03/25/24 1938  PROBNP 2,893.0* 646.0*   HbA1C: No results for input(s): HGBA1C in the  last 72 hours. CBG: Recent Labs  Lab 04/30/24 1140 04/30/24 1550 04/30/24 2112 05/01/24 0720 05/01/24 1143  GLUCAP 167* 227* 208* 154* 173*   Lipid Profile: No results for input(s): CHOL, HDL, LDLCALC, TRIG, CHOLHDL, LDLDIRECT in the last 72 hours. Thyroid Function Tests: No results for input(s): TSH, T4TOTAL, FREET4, T3FREE, THYROIDAB in the last 72 hours. Anemia Panel: No results for input(s): VITAMINB12, FOLATE, FERRITIN, TIBC, IRON, RETICCTPCT in the last 72 hours. Sepsis Labs: Recent Labs  Lab 04/25/24 0017  LATICACIDVEN 0.9    Recent Results (from the past 240 hours)  Surgical pcr screen     Status: None   Collection Time: 04/26/24  8:15 AM   Specimen: Nasal Mucosa; Nasal Swab  Result Value Ref Range Status   MRSA, PCR NEGATIVE NEGATIVE Final   Staphylococcus aureus NEGATIVE NEGATIVE Final    Comment: (NOTE) The Xpert SA Assay (FDA approved for NASAL specimens in patients 72 years of age and older), is one component of a comprehensive surveillance program. It is not intended to diagnose infection nor to guide or monitor treatment. Performed at [facility], [address], [place], [place] [address]          Radiology Studies: No results found.         LOS: 7 days   Time spent= 35 mins    Burgess JAYSON Dare, MD [facility]  If 7PM-7AM, please contact night-coverage  05/01/2024, 12:27 PM

## 2024-05-01 NOTE — Progress Notes (Signed)
 5 Days Post-Op  Subjective: Patient reports pain control is about the same. She is tolerating a soft diet without nausea or vomiting. Having gas from ostomy but no stool output yet. WOC planning to do some teaching with daughter today. She has walked in the halls a few times with her daughter.   Objective: Vital signs in last 24 hours: Temp:  [98.2 F (36.8 C)-98.6 F (37 C)] 98.6 F (37 C) (12/10 0514) Pulse Rate:  [73-78] 73 (12/10 0514) Resp:  [18-19] 19 (12/10 0514) BP: (152-161)/(80-82) 152/82 (12/10 0514) SpO2:  [94 %-98 %] 94 % (12/10 0514) Last BM Date : 04/25/24  Intake/Output from previous day: 12/09 0701 - 12/10 0700 In: 468 [P.O.:240; I.V.:128; IV Piggyback:100] Out: 0  Intake/Output this shift: Total I/O In: 240 [P.O.:240] Out: -   PE: Abd: soft, appropriately tender, obese, ND, midline wound is clean with healthy appearing tissue  colostomy seems flush to the skin and is slightly dusky but no necrotic tissue, small amount gas present, no stool material yet  Lab Results:  Recent Labs    04/30/24 0522 05/01/24 0504  WBC 7.7 7.7  HGB 12.7 12.0  HCT 39.2 37.9  PLT 205 194   BMET Recent Labs    04/29/24 0458 04/30/24 0522  NA 136 136  K 3.8 3.8  CL 102 101  CO2 27 28  GLUCOSE 188* 174*  BUN 8 8  CREATININE 0.66 0.64  CALCIUM  9.6 9.6   PT/INR No results for input(s): LABPROT, INR in the last 72 hours. CMP     Component Value Date/Time   NA 136 04/30/2024 0522   K 3.8 04/30/2024 0522   CL 101 04/30/2024 0522   CO2 28 04/30/2024 0522   GLUCOSE 174 (H) 04/30/2024 0522   BUN 8 04/30/2024 0522   CREATININE 0.64 04/30/2024 0522   CALCIUM  9.6 04/30/2024 0522   PROT 7.0 04/30/2024 0522   ALBUMIN  3.1 (L) 04/30/2024 0522   AST <10 (L) 04/30/2024 0522   ALT 6 04/30/2024 0522   ALKPHOS 78 04/30/2024 0522   BILITOT 0.4 04/30/2024 0522   GFRNONAA >60 04/30/2024 0522   GFRAA >60 04/30/2019 0918   Lipase     Component Value Date/Time    LIPASE 18 04/24/2024 1444       Studies/Results: No results found.  Anti-infectives: Anti-infectives (From admission, onward)    Start     Dose/Rate Route Frequency Ordered Stop   04/25/24 0300  piperacillin -tazobactam (ZOSYN ) IVPB 3.375 g        3.375 g 12.5 mL/hr over 240 Minutes Intravenous Every 8 hours 04/24/24 2338 05/01/24 0601   04/24/24 2100  piperacillin -tazobactam (ZOSYN ) IVPB 3.375 g        3.375 g 100 mL/hr over 30 Minutes Intravenous  Once 04/24/24 2053 04/24/24 2142        Assessment/Plan POD 5, s/p  Hartmann's procedure by Dr. Rubin 12/5 for diverticulitis - continue scheduled tylenol  and robaxin  and oxycodone  PRN for pain control, IV dilaudid  for breakthrough pain. If pain still an issue can consider adding gabapentin  vs tramadol  vs increased oxy scale - continue soft diet  - WOC following, will need referral to ostomy clinic  - mobilize and ambulate as able, PT following - continue BID dressing changes to midline wound - zosyn  in place with an end date in place as well - labs and vitals stable  FEN - soft VTE - heparin  gtt - ok to switch to DOAC from a surgical  prospective  ID - zosyn   - per TR[facility] -  Portal vein thrombosis CHFpEF COPD HTN Obesity DM    LOS: 7 days    Bernarda JAYSON Ned, MD  Colorectal and General Surgery [facility]

## 2024-05-02 LAB — CBC
HCT: 38.4 % (ref 36.0–46.0)
Hemoglobin: 12.3 g/dL (ref 12.0–15.0)
MCH: 31.1 pg (ref 26.0–34.0)
MCHC: 32 g/dL (ref 30.0–36.0)
MCV: 97 fL (ref 80.0–100.0)
Platelets: 192 K/uL (ref 150–400)
RBC: 3.96 MIL/uL (ref 3.87–5.11)
RDW: 13.5 % (ref 11.5–15.5)
WBC: 8.7 K/uL (ref 4.0–10.5)
nRBC: 0 % (ref 0.0–0.2)

## 2024-05-02 LAB — BASIC METABOLIC PANEL WITH GFR
Anion gap: 10 (ref 5–15)
BUN: 9 mg/dL (ref 8–23)
CO2: 26 mmol/L (ref 22–32)
Calcium: 9.6 mg/dL (ref 8.9–10.3)
Chloride: 102 mmol/L (ref 98–111)
Creatinine, Ser: 0.6 mg/dL (ref 0.44–1.00)
GFR, Estimated: >60 mL/min (ref >60)
Glucose, Bld: 156 mg/dL — ABNORMAL HIGH (ref 70–99)
Potassium: 3.7 mmol/L (ref 3.5–5.1)
Sodium: 138 mmol/L (ref 135–145)

## 2024-05-02 LAB — GLUCOSE, CAPILLARY
Glucose-Capillary: 156 mg/dL — ABNORMAL HIGH (ref 70–99)
Glucose-Capillary: 200 mg/dL — ABNORMAL HIGH (ref 70–99)
Glucose-Capillary: 220 mg/dL — ABNORMAL HIGH (ref 70–99)
Glucose-Capillary: 288 mg/dL — ABNORMAL HIGH (ref 70–99)

## 2024-05-02 LAB — MAGNESIUM: Magnesium: 1.8 mg/dL (ref 1.7–2.4)

## 2024-05-02 LAB — HEPARIN LEVEL (UNFRACTIONATED): Heparin Unfractionated: 0.42 [IU]/mL (ref 0.30–0.70)

## 2024-05-02 LAB — PHOSPHORUS: Phosphorus: 3.3 mg/dL (ref 2.5–4.6)

## 2024-05-02 MED ORDER — APIXABAN 5 MG PO TABS
5.0000 mg | ORAL_TABLET | Freq: Two times a day (BID) | ORAL | Status: DC
  Administered 2024-05-02 – 2024-05-06 (×9): 5 mg via ORAL
  Filled 2024-05-02 (×9): qty 1

## 2024-05-02 MED ORDER — POLYETHYLENE GLYCOL 3350 17 G PO PACK
17.0000 g | PACK | Freq: Two times a day (BID) | ORAL | Status: DC
  Administered 2024-05-02 – 2024-05-03 (×3): 17 g via ORAL
  Filled 2024-05-02 (×9): qty 1

## 2024-05-02 MED ORDER — GABAPENTIN 100 MG PO CAPS
200.0000 mg | ORAL_CAPSULE | Freq: Three times a day (TID) | ORAL | Status: DC
  Administered 2024-05-02 – 2024-05-06 (×12): 200 mg via ORAL
  Filled 2024-05-02 (×12): qty 2

## 2024-05-02 NOTE — Plan of Care (Signed)
°  Problem: Nutritional: Goal: Maintenance of adequate nutrition will improve Outcome: Progressing   Problem: Education: Goal: Knowledge of General Education information will improve Description: Including pain rating scale, medication(s)/side effects and non-pharmacologic comfort measures Outcome: Progressing   Problem: Clinical Measurements: Goal: Will remain free from infection Outcome: Progressing   Problem: Clinical Measurements: Goal: Diagnostic test results will improve Outcome: Progressing   Problem: Clinical Measurements: Goal: Respiratory complications will improve Outcome: Progressing   Problem: Clinical Measurements: Goal: Cardiovascular complication will be avoided Outcome: Progressing   Problem: Activity: Goal: Risk for activity intolerance will decrease Outcome: Progressing   Problem: Coping: Goal: Level of anxiety will decrease Outcome: Progressing   Problem: Elimination: Goal: Will not experience complications related to bowel motility Outcome: Progressing   Problem: Elimination: Goal: Will not experience complications related to urinary retention Outcome: Progressing   Problem: Safety: Goal: Ability to remain free from injury will improve Outcome: Progressing

## 2024-05-02 NOTE — Consult Note (Signed)
 WOC Nurse ostomy follow up Stoma type/location: LUQ colostomy. Stomal assessment/size: 25 x 20 mm, oblong shape, dark red, bellow the skin level. Peristomal assessment: not assessed, pt was in pain and the bag was intact. Changed yesterday by the [facility] team. Treatment options for stomal/peristomal skin: [phone] Output no output. Ostomy pouching: 1pc. [phone] (ordered more supplies) Education provided: Reviewed all the steps with ostomy care with patient, no family member in room.  The patient appeared overwhelmed and complained about the lack of information from the healthcare professionals. She is afraid about the surgical procedure and her health status. I spent 40 minutes talking with the patient, providing guidance on stoma care, answering questions, and offering words of comfort.  Enrolled patient in [place] [employer] Start Discharge program: Yes 12/08.  WOC team will follow FRI. Please reconsult if further assistance is needed. Thank-you,  Lela Holm MSN, RN, CWCN, CNS.  (Phone [phone])

## 2024-05-02 NOTE — Progress Notes (Addendum)
 PHARMACY - ANTICOAGULATION CONSULT NOTE  Pharmacy Consult for heparin  drip  Indication: hx recent portal vein thrombosis, prothrombin II gene mutation, PTA Eliquis  on hold  Allergies  Allergen Reactions   Bee Venom Anaphylaxis and Hives   Empagliflozin Itching, Rash and Dermatitis   Ibuprofen Nausea And Vomiting   Statins Nausea Only and Other (See Comments)    Muscle spasms and cramps, too    Patient Measurements: Height: 5' 9 (175.3 cm) Weight: 122.5 kg (270 lb) IBW/kg (Calculated) : 66.2 HEPARIN  DW (KG): 94.7  Vital Signs: Temp: 98 F (36.7 C) (12/11 0600) Temp Source: Oral (12/10 2121) BP: 128/59 (12/11 0600) Pulse Rate: 62 (12/11 0600)  Labs: Recent Labs    04/29/24 2143 04/30/24 0522 05/01/24 0504  HGB  --  12.7 12.0  HCT  --  39.2 37.9  PLT  --  205 194  HEPARINUNFRC 0.58 0.62 0.39  CREATININE  --  0.64  --     Estimated Creatinine Clearance: 100.8 mL/min (by C-G formula based on SCr of 0.64 mg/dL).   Medical History: Past Medical History:  Diagnosis Date   Asthma    Cancer (HCC)    Cannabis abuse    Chronic pain    Diabetes mellitus type 2, controlled (HCC)    Dyslipidemia 04/30/2024   Hypertension     Medications:  - on Eliquis  5mg  bid PTA (last dose taken on 04/24/24)  Assessment: Lisa Ibarra is a 63 y.o. female who was recently hospitalized from 04/10/24 to 04/17/24 for acute complicated sigmoid diverticulitis with microperforation/possible abscess and portal vein thrombosis.  She was disharged with Eliquis  dose pack for portal vein thrombosis and oral abx for diverticulitis.  She presented back to the ED on 04/24/2024 with c/o abdominal pain and subsequently underwent sigmoid colectomy and creation of colostomy on 04/26/24.  Heparin  drip started on admission on 04/25/24, held for OR on 04/26/24 and resumed 04/26/24 post-op (NO BOLUS per MD's request).  Significant events:  - 12/6: pt's RN documented at 1:30 PM that pt had a coughing fit and blood  saturated her abdominal wound covering. Bandaged changed - 12/7:  Third shift RN discovered around ~1 AM on 04/28/24 that heparin  drip was stopped at noon on 04/27/24 by day shift RN.  There was no documentation as to why this was held and whether pt's RN received order from a provider to hold heparin  drip. Heparin  drip resumed back at ~1:47AM on 12/7.   Today, 05/02/2024: - Heparin  level remains therapeutic at 0.42  --> pharmacy has been consulted this morning to transition patient back to Eliquis   - CBC remains stable  - No bleeding documented   Goal of Therapy:  Heparin  level 0.3-0.7 units/ml aPTT 66-102 seconds Monitor platelets by anticoagulation protocol: Yes   Plan:  - discontinue drip and then resume Eliquis  5 mg bid  - pharmacy will sign off. Re-consult us  if need further assistance   [name], PharmD, BCPS 05/02/2024 7:16 AM

## 2024-05-02 NOTE — Progress Notes (Signed)
 PROGRESS NOTE    Lisa Ibarra  FMW:[id number] DOB: February 14, 1961 DOA: 04/24/2024 PCP: Patient, No Pcp Per    Brief Narrative:   63yo with h/o COPD, chronic HFpEF, HTN, HLD, T2DM, chronic pain syndrome, class 2 obesity, and tobacco and marijuana use who presented on 12/3 with abdominal pain. She was previously admitted from 11/3-7 for acute diverticulitis with bacteremia with Pseudomonas and E coli. She returned from 11/19-26 with diverticulitis with microperforation and possible abscess that was treated with antibiotics; she was also found to have a portal vein thrombosis and started on Eliquis . She returned to the ER due to persistent abdominal pain, having run out of antibiotics 3 days PTA. 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 She was treated with Dilaudid , Zofran , and Zosyn  and surgery was consulted. Ex lap on 12/5 for perforated diverticulitis s/p ostomy creation.   Assessment & Plan:  Acute diverticulitis, complicated by perforation Status post ostomy 12/5 Unfortunately patient had developed perforation and therefore general surgery performed ex lap with sigmoid colectomy with ostomy creation on 12/5.  Postop management per their service and appears to be slowly improving  Mood disorder Reported SI 12/7, but contracts for safety.  Suspected situational in nature.  Seen by psychiatry and has been cleared  Non-cardiac chest pain PPI, mouthwash and Pepcid  was given  Chronic heart failure with preserved ejection fraction (HFpEF) (HCC), 60% Hypoxemia COPD (chronic obstructive pulmonary disease) (HCC) Echo done 03/22/2024 showing EF 55 to 60% and grade 1 diastolic dysfunction; no signs of volume overload at this time Overall compensated As needed bronchodilators  Diabetes mellitus (HCC) Hemoglobin A1c 7.0, good  control Sliding scale and Accu-Cheks  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  Patient is now back on home Eliquis   Essential hypertension Continue home Norvasc , losartan , Toprol  IV as needed  Dyslipidemia Resume atorvastatin  (although it is not clear that she was taking this as prescribed PTA)  Class 2 obesity due to excess calories with body mass index (BMI) of 39.0 to 39.9 in adult Body mass index is 39.87 kg/m.SABRA  Weight loss should be encouraged Outpatient PCP/bariatric medicine f/u encouraged Significantly low or high BMI is associated with higher medical risk including morbidity and mortality      DVT prophylaxis: Eliquis     Code Status: Full Code Family Communication:   Status is: Inpatient Remains inpatient appropriate because: Continue hospital stay until cleared by general surgery   PT Follow up Recs: Home Health Pt12/11/2023 1458  Subjective: Seen at bedside, still having some pain and difficulty. Would like me to increase her gabapentin  to help more with her pain   Examination:  General exam: Appears calm and comfortable  Respiratory system: Clear to auscultation. Respiratory effort normal. Cardiovascular system: S1 & S2 heard, RRR. No JVD, murmurs, rubs, gallops or clicks. No pedal edema. Gastrointestinal system: Abdomen is nondistended, soft and nontender. No organomegaly or masses felt. Normal bowel sounds heard. Central nervous system: Alert and oriented. No focal neurological deficits. Extremities: Symmetric 5 x 5 power. Skin: Surgical site with ostomy noted.  Midline incision, dressing in place Psychiatry: Judgement and insight appear normal. Mood & affect appropriate.                Diet Orders (From admission, onward)     Start     Ordered   04/28/24 0738  DIET SOFT Room service appropriate? Yes;  Fluid consistency: Thin  Diet effective now       Question Answer Comment  Room service appropriate? Yes   Fluid  consistency: Thin      04/28/24 0738            Objective: Vitals:   05/01/24 2121 05/02/24 0600 05/02/24 0927 05/02/24 1032  BP: (!) 146/68 (!) 128/59 (!) 176/82 (!) 167/77  Pulse: 71 62 69 67  Resp: 20 18    Temp: 98 F (36.7 C) 98 F (36.7 C)    TempSrc: Oral     SpO2: 97% 95%    Weight:      Height:        Intake/Output Summary (Last 24 hours) at 05/02/2024 1240 Last data filed at 05/02/2024 9191 Gross per 24 hour  Intake 817.79 ml  Output 450 ml  Net 367.79 ml   Filed Weights   04/24/24 1426  Weight: 122.5 kg    Scheduled Meds:  acetaminophen   1,000 mg Oral Q6H   amLODipine   5 mg Oral Daily   apixaban   5 mg Oral BID   atorvastatin   20 mg Oral Daily   Chlorhexidine  Gluconate Cloth  6 each Topical Daily   gabapentin   200 mg Oral TID   hydrALAZINE   25 mg Oral Q8H   insulin  aspart  0-9 Units Subcutaneous TID WC   lactose free nutrition  237 mL Oral BID BM   lidocaine   1 patch Transdermal Q24H   losartan   100 mg Oral Daily   methocarbamol   1,000 mg Oral QID   metoprolol  succinate  100 mg Oral Daily   pantoprazole   40 mg Oral Daily   polyethylene glycol  17 g Oral BID   traMADol   50 mg Oral TID   Continuous Infusions:  Nutritional status     Body mass index is 39.87 kg/m.  Data Reviewed:   CBC: Recent Labs  Lab 04/28/24 0520 04/29/24 0458 04/30/24 0522 05/01/24 0504 05/02/24 0515  WBC 9.9 8.0 7.7 7.7 8.7  HGB 12.8 12.3 12.7 12.0 12.3  HCT 39.8 39.4 39.2 37.9 38.4  MCV 98.0 99.7 97.8 98.2 97.0  PLT 203 199 205 194 192   Basic Metabolic Panel: Recent Labs  Lab 04/27/24 0427 04/28/24 0520 04/29/24 0458 04/30/24 0522 05/02/24 0515  NA 133* 134* 136 136 138  K 4.1 3.7 3.8 3.8 3.7  CL 98 99 102 101 102  CO2 21* 27 27 28 26   GLUCOSE 225* 164* 188* 174* 156*  BUN 7* 8 8 8 9   CREATININE 0.65 0.63 0.66 0.64 0.60  CALCIUM  9.2 9.7 9.6 9.6 9.6  MG  --   --   --   --  1.8  PHOS  --   --   --   --  3.3   GFR: Estimated Creatinine  Clearance: 100.8 mL/min (by C-G formula based on SCr of 0.6 mg/dL). Liver Function Tests: Recent Labs  Lab 04/27/24 0427 04/28/24 0520 04/29/24 0458 04/30/24 0522  AST 12* <10* <10* <10*  ALT 6 5 6 6   ALKPHOS 66 58 68 78  BILITOT 0.7 0.4 0.4 0.4  PROT 7.9 6.9 6.8 7.0  ALBUMIN  3.3* 3.1* 3.2* 3.1*   No results for input(s): LIPASE, AMYLASE in the last 168 hours. No results for input(s): AMMONIA in the last 168 hours. Coagulation Profile: No results for input(s): INR, PROTIME in the last 168 hours. Cardiac Enzymes: No results for input(s): CKTOTAL, CKMB, CKMBINDEX, TROPONINI in the last 168 hours. BNP (last  3 results) Recent Labs    03/21/24 1310 03/25/24 1938  PROBNP 2,893.0* 646.0*   HbA1C: No results for input(s): HGBA1C in the last 72 hours. CBG: Recent Labs  Lab 05/01/24 1143 05/01/24 1635 05/01/24 2122 05/02/24 0739 05/02/24 1124  GLUCAP 173* 195* 279* 156* 200*   Lipid Profile: No results for input(s): CHOL, HDL, LDLCALC, TRIG, CHOLHDL, LDLDIRECT in the last 72 hours. Thyroid Function Tests: No results for input(s): TSH, T4TOTAL, FREET4, T3FREE, THYROIDAB in the last 72 hours. Anemia Panel: No results for input(s): VITAMINB12, FOLATE, FERRITIN, TIBC, IRON, RETICCTPCT in the last 72 hours. Sepsis Labs: No results for input(s): PROCALCITON, LATICACIDVEN in the last 168 hours.  Recent Results (from the past 240 hours)  Surgical pcr screen     Status: None   Collection Time: 04/26/24  8:15 AM   Specimen: Nasal Mucosa; Nasal Swab  Result Value Ref Range Status   MRSA, PCR NEGATIVE NEGATIVE Final   Staphylococcus aureus NEGATIVE NEGATIVE Final    Comment: (NOTE) The Xpert SA Assay (FDA approved for NASAL specimens in patients 35 years of age and older), is one component of a comprehensive surveillance program. It is not intended to diagnose infection nor to guide or monitor treatment. Performed at  [facility], [address] [place], [place] [address]          Radiology Studies: No results found.         LOS: 8 days   Time spent= 35 mins    Burgess JAYSON Dare, MD [facility]  If 7PM-7AM, please contact night-coverage  05/02/2024, 12:40 PM

## 2024-05-02 NOTE — TOC Progression Note (Signed)
 Transition of Care [facility]) - Progression Note    Patient Details  Name: Lisa Ibarra MRN: [id number] Date of Birth: February 15, 1961  [facility] of [facility]) CM/SW Contact  Vic Esco, Nathanel, RN Phone Number: 05/02/2024, 9:59 AM  Clinical Narrative:  Spoke to [name](dtr) in agreement to [facility] providing HHRN also added to HHPT;otpt psychotherapy resources added to AVS. Has own transport.     Expected Discharge Plan: Home w Home Health Services Barriers to Discharge: Continued Medical Work up               Expected Discharge Plan and Services   Discharge Planning Services: CM Consult Post Acute Care Choice: Home Health, Durable Medical Equipment Living arrangements for the past 2 months: Single Family Home                 DME Arranged: 3-N-1 DME Agency: [employer] Date DME Agency Contacted: 04/29/24 Time DME Agency Contacted: 1502 Representative spoke with at DME Agency: [place] HH Arranged: RN, PT [facility] Agency: [facility] Date [facility] Agency Contacted: 05/02/24 Time HH Agency Contacted: [phone] Representative spoke with at [facility] Agency: [place]   Social Drivers of Health (SDOH) Interventions SDOH Screenings   Food Insecurity: No Food Insecurity (04/25/2024)  Housing: Low Risk (04/25/2024)  Transportation Needs: No Transportation Needs (04/25/2024)  Utilities: Not At Risk (04/25/2024)  Financial Resource Strain: Low Risk (10/27/2023)   Received from [facility]  Physical Activity: Unknown (10/27/2023)   Received from [facility]  Social Connections: Moderately Integrated (10/27/2023)   Received from [facility]  Stress: Stress Concern Present (10/27/2023)   Received from [facility]  Tobacco Use: High Risk (04/24/2024)   Received from [facility]    Readmission Risk Interventions    04/12/2024   10:17 AM 03/24/2024   12:01 PM 03/22/2024    1:37 PM  Readmission Risk Prevention Plan  Post Dischage Appt  Complete   Medication Screening  Complete    Transportation Screening Complete Complete Complete  PCP or Specialist Appt within 5-7 Days   Complete  PCP or Specialist Appt within 3-5 Days Complete    Home Care Screening   Complete  Medication Review (RN CM)   Complete  HRI or Home Care Consult Complete    Social Work Consult for Recovery Care Planning/Counseling Complete    Palliative Care Screening Not Applicable    Medication Review [employer]) Complete

## 2024-05-02 NOTE — Progress Notes (Signed)
 6 Days Post-Op  Subjective: Patient reports quite a bit of abd pain that she believes is due to constipation. She denies any nausea or vomiting. Having gas from ostomy but no stool output yet. WOC has done some teaching with daughter yesterday.  Objective: Vital signs in last 24 hours: Temp:  [98 F (36.7 C)-98.8 F (37.1 C)] 98 F (36.7 C) (12/11 0600) Pulse Rate:  [62-81] 62 (12/11 0600) Resp:  [16-20] 18 (12/11 0600) BP: (128-179)/(59-86) 128/59 (12/11 0600) SpO2:  [95 %-100 %] 95 % (12/11 0600) Last BM Date : 04/25/24  Intake/Output from previous day: 12/10 0701 - 12/11 0700 In: 1009.3 [P.O.:600; I.V.:409.3] Out: 450 [Urine:450] Intake/Output this shift: Total I/O In: 48.5 [I.V.:48.5] Out: -   PE: Abd: soft, appropriately tender, obese, ND, midline wound is clean with healthy appearing tissue  colostomy seems flush to the skin and is without necrotic tissue, small amount gas present, no stool material yet  Lab Results:  Recent Labs    05/01/24 0504 05/02/24 0515  WBC 7.7 8.7  HGB 12.0 12.3  HCT 37.9 38.4  PLT 194 192   BMET Recent Labs    04/30/24 0522 05/02/24 0515  NA 136 138  K 3.8 3.7  CL 101 102  CO2 28 26  GLUCOSE 174* 156*  BUN 8 9  CREATININE 0.64 0.60  CALCIUM  9.6 9.6   PT/INR No results for input(s): LABPROT, INR in the last 72 hours. CMP     Component Value Date/Time   NA 138 05/02/2024 0515   K 3.7 05/02/2024 0515   CL 102 05/02/2024 0515   CO2 26 05/02/2024 0515   GLUCOSE 156 (H) 05/02/2024 0515   BUN 9 05/02/2024 0515   CREATININE 0.60 05/02/2024 0515   CALCIUM  9.6 05/02/2024 0515   PROT 7.0 04/30/2024 0522   ALBUMIN  3.1 (L) 04/30/2024 0522   AST <10 (L) 04/30/2024 0522   ALT 6 04/30/2024 0522   ALKPHOS 78 04/30/2024 0522   BILITOT 0.4 04/30/2024 0522   GFRNONAA >60 05/02/2024 0515   GFRAA >60 04/30/2019 0918   Lipase     Component Value Date/Time   LIPASE 18 04/24/2024 1444       Studies/Results: No  results found.  Anti-infectives: Anti-infectives (From admission, onward)    Start     Dose/Rate Route Frequency Ordered Stop   04/25/24 0300  piperacillin -tazobactam (ZOSYN ) IVPB 3.375 g        3.375 g 12.5 mL/hr over 240 Minutes Intravenous Every 8 hours 04/24/24 2338 05/01/24 0601   04/24/24 2100  piperacillin -tazobactam (ZOSYN ) IVPB 3.375 g        3.375 g 100 mL/hr over 30 Minutes Intravenous  Once 04/24/24 2053 04/24/24 2142        Assessment/Plan POD 6, s/p  Hartmann's procedure by Dr. Rubin 12/5 for diverticulitis - continue scheduled tylenol  and robaxin  and oxycodone  PRN for pain control, IV dilaudid  for breakthrough pain.  - continue soft diet  - WOC following, will need referral to ostomy clinic  - mobilize and ambulate as able, PT following - continue BID dressing changes to midline wound.  Could do wound vac if patient prefers - zosyn  in place with an end date in place as well - labs and vitals stable  FEN - soft VTE - heparin  gtt - ok to switch to DOAC from a surgical prospective  ID - zosyn   - per TRH -  Portal vein thrombosis CHFpEF COPD HTN Obesity DM  LOS: 8 days    Lisa JAYSON Ned, MD  Colorectal and General Surgery [facility]

## 2024-05-03 LAB — GLUCOSE, CAPILLARY
Glucose-Capillary: 154 mg/dL — ABNORMAL HIGH (ref 70–99)
Glucose-Capillary: 174 mg/dL — ABNORMAL HIGH (ref 70–99)
Glucose-Capillary: 185 mg/dL — ABNORMAL HIGH (ref 70–99)
Glucose-Capillary: 251 mg/dL — ABNORMAL HIGH (ref 70–99)

## 2024-05-03 MED ORDER — IPRATROPIUM-ALBUTEROL 0.5-2.5 (3) MG/3ML IN SOLN
3.0000 mL | Freq: Three times a day (TID) | RESPIRATORY_TRACT | Status: DC
  Administered 2024-05-03 – 2024-05-06 (×9): 3 mL via RESPIRATORY_TRACT
  Filled 2024-05-03 (×10): qty 3

## 2024-05-03 MED ORDER — INSULIN ASPART 100 UNIT/ML IJ SOLN
4.0000 [IU] | Freq: Three times a day (TID) | INTRAMUSCULAR | Status: DC
  Administered 2024-05-03 – 2024-05-06 (×11): 4 [IU] via SUBCUTANEOUS
  Filled 2024-05-03 (×11): qty 4

## 2024-05-03 NOTE — Progress Notes (Signed)
 PROGRESS NOTE    MADELIENE Ibarra  FMW:[id number] DOB: 03/11/1961 DOA: 04/24/2024 PCP: Patient, No Pcp Per    Brief Narrative:   63yo with h/o COPD, chronic HFpEF, HTN, HLD, T2DM, chronic pain syndrome, class 2 obesity, and tobacco and marijuana use who presented on 12/3 with abdominal pain. She was previously admitted from 11/3-7 for acute diverticulitis with bacteremia with Pseudomonas and E coli. She returned from 11/19-26 with diverticulitis with microperforation and possible abscess that was treated with antibiotics; she was also found to have a portal vein thrombosis and started on Eliquis . She returned to the ER due to persistent abdominal pain, having run out of antibiotics 3 days PTA. 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 She was treated with Dilaudid , Zofran , and Zosyn  and surgery was consulted. Ex lap on 12/5 for perforated diverticulitis s/p ostomy creation.  Slowly resuming and advancing her diet, pain control and working with therapy.  Assessment & Plan:  Acute diverticulitis, complicated by perforation Status post ostomy 12/5 Unfortunately patient had developed perforation and therefore general surgery performed ex lap with sigmoid colectomy with ostomy creation on 12/5.  Postop management per their service and appears to be slowly improving.  Pain control per general surgery  Mood disorder Reported SI 12/7, but contracts for safety.  Suspected situational in nature.  Seen by psychiatry and has been cleared  Non-cardiac chest pain PPI, mouthwash and Pepcid  was given  Chronic heart failure with preserved ejection fraction (HFpEF) (HCC), 60% Hypoxemia COPD (chronic obstructive pulmonary disease) (HCC) Echo done 03/22/2024 showing EF 55 to 60% and grade 1 diastolic dysfunction; no signs of volume overload at this  time Overall compensated As needed bronchodilators  Diabetes mellitus (HCC), uncontrolled with hyperglycemia Hemoglobin A1c 7.0,  Sliding scale and Accu-Cheks.  Add Premeal regimen.  Adjust as necessary  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  Patient is now back on home Eliquis   Essential hypertension Continue home Norvasc , losartan , Toprol  IV as needed  Dyslipidemia Resume atorvastatin  (although it is not clear that she was taking this as prescribed PTA)  Class 2 obesity due to excess calories with body mass index (BMI) of 39.0 to 39.9 in adult Body mass index is 39.87 kg/m.SABRA  Weight loss should be encouraged Outpatient PCP/bariatric medicine f/u encouraged Significantly low or high BMI is associated with higher medical risk including morbidity and mortality      DVT prophylaxis: Eliquis     Code Status: Full Code Family Communication:   Status is: Inpatient Remains inpatient appropriate because: Continue hospital stay until cleared by general surgery   PT Follow up Recs: Home Health Pt12/11/2023 1458  Subjective: Still having abdominal pain and discomfort.  No other complaints Examination:  General exam: Appears calm and comfortable  Respiratory system: Clear to auscultation. Respiratory effort normal. Cardiovascular system: S1 & S2 heard, RRR. No JVD, murmurs, rubs, gallops or clicks. No pedal edema. Gastrointestinal system: Abdomen is nondistended, soft and nontender. No organomegaly or masses felt. Normal bowel sounds heard. Central nervous system: Alert and oriented. No focal neurological deficits. Extremities: Symmetric 5 x 5 power. Skin: Surgical site with ostomy noted.  Midline incision, dressing in place Psychiatry: Judgement and insight appear normal. Mood & affect appropriate.                Diet Orders (From admission, onward)     Start  Ordered   05/03/24 0707  Diet regular Room service appropriate? Yes; Fluid  consistency: Thin  Diet effective now       Comments: Please make sure that she can have prune juice 2 or 3 times a day.  Please include this with all of her meals.  Thank you so much.  Have a very Merry Christmas!!!   Jeralyn  Question Answer Comment  Room service appropriate? Yes   Fluid consistency: Thin      05/03/24 0708            Objective: Vitals:   05/02/24 1327 05/02/24 2021 05/03/24 0445 05/03/24 1029  BP: (!) 141/72 (!) 144/74 139/76   Pulse: 74 73 70   Resp: 16 20 20    Temp: 98.7 F (37.1 C) 98.3 F (36.8 C) 98.2 F (36.8 C)   TempSrc: Oral Oral Oral   SpO2: 93% 91% 94% 95%  Weight:      Height:        Intake/Output Summary (Last 24 hours) at 05/03/2024 1209 Last data filed at 05/02/2024 2130 Gross per 24 hour  Intake 480 ml  Output --  Net 480 ml   Filed Weights   04/24/24 1426  Weight: 122.5 kg    Scheduled Meds:  acetaminophen   1,000 mg Oral Q6H   amLODipine   5 mg Oral Daily   apixaban   5 mg Oral BID   atorvastatin   20 mg Oral Daily   Chlorhexidine  Gluconate Cloth  6 each Topical Daily   gabapentin   200 mg Oral TID   hydrALAZINE   25 mg Oral Q8H   insulin  aspart  0-9 Units Subcutaneous TID WC   insulin  aspart  4 Units Subcutaneous TID WC   ipratropium-albuterol   3 mL Nebulization TID   lactose free nutrition  237 mL Oral BID BM   lidocaine   1 patch Transdermal Q24H   losartan   100 mg Oral Daily   methocarbamol   1,000 mg Oral QID   metoprolol  succinate  100 mg Oral Daily   pantoprazole   40 mg Oral Daily   polyethylene glycol  17 g Oral BID   traMADol   50 mg Oral TID   Continuous Infusions:  Nutritional status     Body mass index is 39.87 kg/m.  Data Reviewed:   CBC: Recent Labs  Lab 04/28/24 0520 04/29/24 0458 04/30/24 0522 05/01/24 0504 05/02/24 0515  WBC 9.9 8.0 7.7 7.7 8.7  HGB 12.8 12.3 12.7 12.0 12.3  HCT 39.8 39.4 39.2 37.9 38.4  MCV 98.0 99.7 97.8 98.2 97.0  PLT 203 199 205 194 192   Basic Metabolic Panel: Recent  Labs  Lab 04/27/24 0427 04/28/24 0520 04/29/24 0458 04/30/24 0522 05/02/24 0515  NA 133* 134* 136 136 138  K 4.1 3.7 3.8 3.8 3.7  CL 98 99 102 101 102  CO2 21* 27 27 28 26   GLUCOSE 225* 164* 188* 174* 156*  BUN 7* 8 8 8 9   CREATININE 0.65 0.63 0.66 0.64 0.60  CALCIUM  9.2 9.7 9.6 9.6 9.6  MG  --   --   --   --  1.8  PHOS  --   --   --   --  3.3   GFR: Estimated Creatinine Clearance: 100.8 mL/min (by C-G formula based on SCr of 0.6 mg/dL). Liver Function Tests: Recent Labs  Lab 04/27/24 0427 04/28/24 0520 04/29/24 0458 04/30/24 0522  AST 12* <10* <10* <10*  ALT 6 5 6 6   ALKPHOS 66 58 68 78  BILITOT 0.7 0.4 0.4 0.4  PROT 7.9 6.9 6.8 7.0  ALBUMIN  3.3* 3.1* 3.2* 3.1*   No results for input(s): LIPASE, AMYLASE in the last 168 hours. No results for input(s): AMMONIA in the last 168 hours. Coagulation Profile: No results for input(s): INR, PROTIME in the last 168 hours. Cardiac Enzymes: No results for input(s): CKTOTAL, CKMB, CKMBINDEX, TROPONINI in the last 168 hours. BNP (last 3 results) Recent Labs    03/21/24 1310 03/25/24 1938  PROBNP 2,893.0* 646.0*   HbA1C: No results for input(s): HGBA1C in the last 72 hours. CBG: Recent Labs  Lab 05/02/24 1124 05/02/24 1638 05/02/24 2149 05/03/24 0715 05/03/24 1205  GLUCAP 200* 220* 288* 154* 174*   Lipid Profile: No results for input(s): CHOL, HDL, LDLCALC, TRIG, CHOLHDL, LDLDIRECT in the last 72 hours. Thyroid Function Tests: No results for input(s): TSH, T4TOTAL, FREET4, T3FREE, THYROIDAB in the last 72 hours. Anemia Panel: No results for input(s): VITAMINB12, FOLATE, FERRITIN, TIBC, IRON, RETICCTPCT in the last 72 hours. Sepsis Labs: No results for input(s): PROCALCITON, LATICACIDVEN in the last 168 hours.  Recent Results (from the past 240 hours)  Surgical pcr screen     Status: None   Collection Time: 04/26/24  8:15 AM   Specimen: Nasal Mucosa;  Nasal Swab  Result Value Ref Range Status   MRSA, PCR NEGATIVE NEGATIVE Final   Staphylococcus aureus NEGATIVE NEGATIVE Final    Comment: (NOTE) The Xpert SA Assay (FDA approved for NASAL specimens in patients 57 years of age and older), is one component of a comprehensive surveillance program. It is not intended to diagnose infection nor to guide or monitor treatment. Performed at [facility], [address], [place], [place] [address]          Radiology Studies: No results found.         LOS: 9 days   Time spent= 35 mins    Burgess JAYSON Dare, MD [facility]  If 7PM-7AM, please contact night-coverage  05/03/2024, 12:09 PM

## 2024-05-03 NOTE — Discharge Instructions (Signed)
 WOUND CARE: - midline dressing to be changed daily - supplies: sterile saline, gauze, scissors, tape  - remove dressing and all packing carefully, moistening with sterile saline as needed to avoid packing/internal dressing sticking to the wound. - clean edges of skin around the wound with water/gauze, making sure there is no tape debris or leakage left on skin that could cause skin irritation or breakdown. - dampen and clean gauze with sterile saline and pack wound from wound base to skin level, making sure to take note of any possible areas of wound tracking, tunneling and packing appropriately. Wound can be packed loosely. Trim gauze to size if a whole gauze is not required. - cover wound with a dry gauze and secure with tape.  - write the date/time on the dry dressing/tape to better track when the last dressing change occurred. - change dressing as needed if leakage occurs, wound gets contaminated, or patient requests to shower. - patient may shower daily with wound open (i.e. remove all packing) and following the shower the wound should be dried and a clean dressing placed.     [facility]  Please arrive at least 30 min before your appointment to complete your check in paperwork.  If you are unable to arrive 30 min prior to your appointment time we may have to cancel or reschedule you. LAPAROSCOPIC SURGERY: POST OP INSTRUCTIONS Always review your discharge instruction sheet given to you by the facility where your surgery was performed. IF YOU HAVE DISABILITY OR FAMILY LEAVE FORMS, YOU MUST BRING THEM TO THE OFFICE FOR PROCESSING.   DO NOT GIVE THEM TO YOUR DOCTOR.  PAIN CONTROL  First take acetaminophen (Tylenol) AND/or ibuprofen (Advil) to control your pain after surgery.  Follow directions on package.  Taking acetaminophen (Tylenol) and/or ibuprofen (Advil) regularly after surgery will help to control your pain and lower the amount of prescription pain medication  you may need.  You should not take more than 4,000 mg (4 grams) of acetaminophen (Tylenol) in 24 hours.  You should not take ibuprofen (Advil), aleve, motrin, naprosyn or other NSAIDS if you have a history of stomach ulcers or chronic kidney disease.  A prescription for pain medication may be given to you upon discharge.  Take your pain medication as prescribed, if you still have uncontrolled pain after taking acetaminophen (Tylenol) or ibuprofen (Advil). Use ice packs to help control pain. If you need a refill on your pain medication, please contact your pharmacy.  They will contact our office to request authorization. Prescriptions will not be filled after 5pm or on week-ends.  HOME MEDICATIONS Take your usually prescribed medications unless otherwise directed.  DIET You should follow a light diet the first few days after arrival home.  Be sure to include lots of fluids daily. Avoid fatty, fried foods.   CONSTIPATION It is common to experience some constipation after surgery and if you are taking pain medication.  Increasing fluid intake and taking a stool softener (such as Colace) will usually help or prevent this problem from occurring.  A mild laxative (Milk of Magnesia or Miralax) should be taken according to package instructions if there are no bowel movements after 48 hours.  WOUND/INCISION CARE Most patients will experience some swelling and bruising in the area of the incisions.  Ice packs will help.  Swelling and bruising can take several days to resolve.  Unless discharge instructions indicate otherwise, follow guidelines below  STERI-STRIPS - you may remove your outer bandages  48 hours after surgery, and you may shower at that time.  You have steri-strips (small skin tapes) in place directly over the incision.  These strips should be left on the skin for 7-10 days.   DERMABOND/SKIN GLUE - you may shower in 24 hours.  The glue will flake off over the next 2-3 weeks. Any sutures or  staples will be removed at the office during your follow-up visit.  ACTIVITIES You may resume regular (light) daily activities beginning the next day--such as daily self-care, walking, climbing stairs--gradually increasing activities as tolerated.  You may have sexual intercourse when it is comfortable.  Refrain from any heavy lifting or straining until approved by your doctor. You may drive when you are no longer taking prescription pain medication, you can comfortably wear a seatbelt, and you can safely maneuver your car and apply brakes.  FOLLOW-UP You should see your doctor in the office for a follow-up appointment approximately 2-3 weeks after your surgery.  You should have been given your post-op/follow-up appointment when your surgery was scheduled.  If you did not receive a post-op/follow-up appointment, make sure that you call for this appointment within a day or two after you arrive home to insure a convenient appointment time.   WHEN TO CALL YOUR DOCTOR: Fever over 101.0 Inability to urinate Continued bleeding from incision. Increased pain, redness, or drainage from the incision. Increasing abdominal pain  The clinic staff is available to answer your questions during regular business hours.  Please don't hesitate to call and ask to speak to one of the nurses for clinical concerns.  If you have a medical emergency, go to the nearest emergency room or call 911.  A surgeon from [facility] is always on call at the hospital. [address], [place], [place]  [address] ? [address] [address], [place], [place]   [address] [phone] ? [phone] ? FAX [phone]

## 2024-05-03 NOTE — Progress Notes (Signed)
° ° °  7 Days Post-Op  Subjective: Patient reports that she is having bowel movements now. Abd pain is improving. She denies any nausea or vomiting.  Objective: Vital signs in last 24 hours: Temp:  [98 F (36.7 C)-98.3 F (36.8 C)] 98 F (36.7 C) (12/12 1349) Pulse Rate:  [69-73] 69 (12/12 1349) Resp:  [18-20] 18 (12/12 1349) BP: (139-144)/(62-76) 142/62 (12/12 1349) SpO2:  [91 %-95 %] 93 % (12/12 1349) Last BM Date : 04/25/24  Intake/Output from previous day: 12/11 0701 - 12/12 0700 In: 528.5 [P.O.:480; I.V.:48.5] Out: -  Intake/Output this shift: No intake/output data recorded.  PE: Abd: soft, appropriately tender, obese, ND, midline wound is clean with healthy appearing tissue  colostomy seems flush to the skin and is without necrotic tissue, small amount gas present, stool present  Lab Results:  Recent Labs    05/01/24 0504 05/02/24 0515  WBC 7.7 8.7  HGB 12.0 12.3  HCT 37.9 38.4  PLT 194 192   BMET Recent Labs    05/02/24 0515  NA 138  K 3.7  CL 102  CO2 26  GLUCOSE 156*  BUN 9  CREATININE 0.60  CALCIUM  9.6   PT/INR No results for input(s): LABPROT, INR in the last 72 hours. CMP     Component Value Date/Time   NA 138 05/02/2024 0515   K 3.7 05/02/2024 0515   CL 102 05/02/2024 0515   CO2 26 05/02/2024 0515   GLUCOSE 156 (H) 05/02/2024 0515   BUN 9 05/02/2024 0515   CREATININE 0.60 05/02/2024 0515   CALCIUM  9.6 05/02/2024 0515   PROT 7.0 04/30/2024 0522   ALBUMIN  3.1 (L) 04/30/2024 0522   AST <10 (L) 04/30/2024 0522   ALT 6 04/30/2024 0522   ALKPHOS 78 04/30/2024 0522   BILITOT 0.4 04/30/2024 0522   GFRNONAA >60 05/02/2024 0515   GFRAA >60 04/30/2019 0918   Lipase     Component Value Date/Time   LIPASE 18 04/24/2024 1444       Studies/Results: No results found.  Anti-infectives: Anti-infectives (From admission, onward)    Start     Dose/Rate Route Frequency Ordered Stop   04/25/24 0300  piperacillin -tazobactam (ZOSYN ) IVPB  3.375 g        3.375 g 12.5 mL/hr over 240 Minutes Intravenous Every 8 hours 04/24/24 2338 05/01/24 0601   04/24/24 2100  piperacillin -tazobactam (ZOSYN ) IVPB 3.375 g        3.375 g 100 mL/hr over 30 Minutes Intravenous  Once 04/24/24 2053 04/24/24 2142        Assessment/Plan POD 7, s/p  Hartmann's procedure by Dr. Rubin 12/5 for diverticulitis - continue scheduled tylenol  and robaxin  and oxycodone  PRN for pain control, IV dilaudid  for breakthrough pain.  - continue soft diet  - WOC following, will need referral to ostomy clinic  - mobilize and ambulate as able, PT following - continue BID dressing changes to midline wound.  Could do wound vac if patient prefers - zosyn : completed course - labs and vitals stable  FEN - soft VTE - heparin  gtt - ok to switch to DOAC from a surgical prospective   Ok for discharge from surgical standpoint.    - per [facility] -  Portal vein thrombosis CHFpEF COPD HTN Obesity DM    LOS: 9 days    Bernarda JAYSON Ned, MD  Colorectal and General Surgery [facility]

## 2024-05-03 NOTE — Plan of Care (Signed)
°  Problem: Coping: Goal: Ability to adjust to condition or change in health will improve Outcome: Progressing   Problem: Fluid Volume: Goal: Ability to maintain a balanced intake and output will improve Outcome: Progressing   Problem: Health Behavior/Discharge Planning: Goal: Ability to manage health-related needs will improve Outcome: Progressing   Problem: Nutritional: Goal: Maintenance of adequate nutrition will improve Outcome: Progressing   Problem: Skin Integrity: Goal: Risk for impaired skin integrity will decrease Outcome: Progressing   Problem: Tissue Perfusion: Goal: Adequacy of tissue perfusion will improve Outcome: Progressing   Problem: Education: Goal: Knowledge of General Education information will improve Description: Including pain rating scale, medication(s)/side effects and non-pharmacologic comfort measures Outcome: Progressing   Problem: Health Behavior/Discharge Planning: Goal: Ability to manage health-related needs will improve Outcome: Progressing   Problem: Clinical Measurements: Goal: Will remain free from infection Outcome: Progressing Goal: Diagnostic test results will improve Outcome: Progressing Goal: Respiratory complications will improve Outcome: Progressing Goal: Cardiovascular complication will be avoided Outcome: Progressing   Problem: Activity: Goal: Risk for activity intolerance will decrease Outcome: Progressing   Problem: Nutrition: Goal: Adequate nutrition will be maintained Outcome: Progressing   Problem: Coping: Goal: Level of anxiety will decrease Outcome: Progressing   Problem: Elimination: Goal: Will not experience complications related to bowel motility Outcome: Progressing Goal: Will not experience complications related to urinary retention Outcome: Progressing   Problem: Pain Managment: Goal: General experience of comfort will improve and/or be controlled Outcome: Progressing   Problem: Safety: Goal:  Ability to remain free from injury will improve Outcome: Progressing   Problem: Skin Integrity: Goal: Risk for impaired skin integrity will decrease Outcome: Progressing

## 2024-05-03 NOTE — Progress Notes (Signed)
 She still not yet had a bowel movement.  She says that she will have a bowel movement she can have prune juice.  I do not see a reason why she cannot have prune juice.  I think when she has a bowel movement and then she can probably go home.  She now is on Eliquis .  I am certainly okay with switching over to Eliquis  right now..  She is out of bed.  She is walking.  She is having no problems with urinating..  She has a few wheezing episodes.  She is on nebulizer therapy.  I think she may need to have nebulizers on a schedule.  She is having some abdominal pain which she feels is probably from some constipation.  She has had no obvious fever.  She has had a little bit of blood from the ostomy.  She had lab work done yesterday.  White cell count was 8.7.  Hemoglobin 12.3.  Platelet count 192,000.  Her sodium 138.  Potassium 3.7.  BUN 9 creatinine 0.6.  Calcium  9.6.  Her vital signs showed temperature 98.2.  Pulse 70.  Blood pressure 139/76.  Her cardiac exam shows regular rate and rhythm.  She has no murmurs.  Lungs do sound a little wheezy.  She has good air movement.  Her lungs do not sound tight.  Abdominal exam shows the ostomy to be intact.  She has some fluid in the ostomy bag.  Her bowel sounds are clearly present.  There is no guarding or rebound tenderness.  Extremity shows no clubbing, cyanosis or edema.  Neurological exam shows no focal neurological deficits.   Lisa Ibarra is a very nice 63 year old Afro-American woman.  She had surgery for perforated diverticulum.  She has an ostomy which is temporary.  She still has not yet had a bowel movement.  I do not see a reason why she cannot have prune juice.  I would think that maybe her diet might be able to be increased a little bit.  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  This seems to be improving.  She is on Eliquis .  Hopefully, she will be able to go home soon.  I know she would like to be home.  I know that the staff on [address] have done a  great job taking care of her.  The staff are away so compassionate and empathetic.   Lisa Crease, MD  Ila 9:6

## 2024-05-03 NOTE — Consult Note (Signed)
 WOC Nurse ostomy follow up, teaching session #2 Stoma type/location:  LUQ Colostomy Patient complains of high amount of flatus, some cramping before flatus is noted in pouch or heard. Appliance about to leak on side, cut by staff with peristomal skin expose, will need to be changed out, primary nurse watching step by step process at bedside. Stomal assessment/size: 28 mm, moist, viable, at skin level, oblong shape, crease at 9 o'clock, some bloody mucous easily removed upon cleaning area. Peristomal assessment: intact, no breakdown, crease at 9 o'clock, MASD Treatment options for stomal/peristomal skin:  applied new 1 piece convex/barrier ring, crusting method used and explained to patient and primary nurse, dated pouch Output brownish-red effluent small amount, patient is now eating solids  Ostomy pouching: 1pc soft convex #[id number], barrier ring [phone] Reviewed key concepts with patient and patient had a good idea of appliance application.   Goal: good effluent output with volume  WOC to follow-up with patient until discharge, patient verbalized understanding of that WOC for ostomy does not work on the weekends.  Sherrilyn Hals MSN RN CWOCN WOC [facility]  [phone] (Available from 7-3 pm Mon-Friday)

## 2024-05-04 DIAGNOSIS — K5792 Diverticulitis of intestine, part unspecified, without perforation or abscess without bleeding: Secondary | ICD-10-CM | POA: Diagnosis not present

## 2024-05-04 LAB — GLUCOSE, CAPILLARY
Glucose-Capillary: 174 mg/dL — ABNORMAL HIGH (ref 70–99)
Glucose-Capillary: 172 mg/dL — ABNORMAL HIGH (ref 70–99)
Glucose-Capillary: 236 mg/dL — ABNORMAL HIGH (ref 70–99)
Glucose-Capillary: 173 mg/dL — ABNORMAL HIGH (ref 70–99)

## 2024-05-04 MED ORDER — BOOST PLUS PO LIQD: 237.0000 mL | Freq: Two times a day (BID) | ORAL | Status: DC | PRN

## 2024-05-04 NOTE — Progress Notes (Signed)
 PROGRESS NOTE    Lisa Ibarra  FMW:[id number] DOB: [birth year] DOA: 04/24/2024 PCP: Patient, No Pcp Per    Brief Narrative:   63yo with h/o COPD, chronic HFpEF, HTN, HLD, T2DM, chronic pain syndrome, class 2 obesity, and tobacco and marijuana use who presented on 12/3 with abdominal pain. She was previously admitted from 11/3-7 for acute diverticulitis with bacteremia with Pseudomonas and E coli. She returned from 11/19-26 with diverticulitis with microperforation and possible abscess that was treated with antibiotics; she was also found to have a portal vein thrombosis and started on Eliquis . She returned to the ER due to persistent abdominal pain, having run out of antibiotics 3 days PTA. 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 She was treated with Dilaudid , Zofran , and Zosyn  and surgery was consulted. Ex lap on 12/5 for perforated diverticulitis s/p ostomy creation.  Slowly resuming and advancing her diet, pain control and working with therapy.  Assessment & Plan:  Acute diverticulitis, complicated by perforation Status post ostomy 12/5 Unfortunately patient had developed perforation and therefore general surgery performed ex lap with sigmoid colectomy with ostomy creation on 12/5.  Postop management per surgical service now cleared for discharge.  Dressing changes per their service as well.  Mood disorder Reported SI 12/7, but contracts for safety.  Suspected situational in nature.  Seen by psychiatry and has been cleared  Non-cardiac chest pain PPI, mouthwash and Pepcid  was given  Chronic heart failure with preserved ejection fraction (HFpEF) (HCC), 60% Hypoxemia COPD (chronic obstructive pulmonary disease) (HCC) Echo done 03/22/2024 showing EF 55 to 60% and grade 1 diastolic dysfunction; no signs of volume overload at  this time Overall compensated As needed bronchodilators  Diabetes mellitus (HCC), uncontrolled with hyperglycemia Hemoglobin A1c 7.0,  Currently on sliding scale and Premeal regimen.  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  Patient is now back on home Eliquis   Essential hypertension Continue home Norvasc , losartan , Toprol  IV as needed  Dyslipidemia Resume atorvastatin  (although it is not clear that she was taking this as prescribed PTA)  Class 2 obesity due to excess calories with body mass index (BMI) of 39.0 to 39.9 in adult Body mass index is 39.87 kg/m.Lisa Ibarra  Weight loss should be encouraged Outpatient PCP/bariatric medicine f/u encouraged Significantly low or high BMI is associated with higher medical risk including morbidity and mortality      DVT prophylaxis: Eliquis     Code Status: Full Code Family Communication:   Status is: Inpatient Remains inpatient appropriate because: Working on having bowel movement.  Hopefully discharge tomorrow or by Monday.  TOC to work on making home health arrangements   PT Follow up Recs: Home Health Pt12/11/2023 1458  Subjective: She still has not had a bowel movement.   Examination:  General exam: Appears calm and comfortable  Respiratory system: Clear to auscultation. Respiratory effort normal. Cardiovascular system: S1 & S2 heard, RRR. No JVD, murmurs, rubs, gallops or clicks. No pedal edema. Gastrointestinal system: Abdomen is nondistended, soft and nontender. No organomegaly or masses felt. Normal bowel sounds heard. Central nervous system: Alert and oriented. No focal neurological deficits. Extremities: Symmetric 5 x 5 power. Skin: Surgical site with ostomy noted.  Midline incision, dressing in place Psychiatry: Judgement and insight appear normal. Mood & affect appropriate.                Diet Orders (From admission,  onward)     Start     Ordered   05/03/24 0707  Diet regular Room  service appropriate? Yes; Fluid consistency: Thin  Diet effective now       Comments: Please make sure that she can have prune juice 2 or 3 times a day.  Please include this with all of her meals.  Thank you so much.  Have a very Merry Christmas!!!   Jeralyn  Question Answer Comment  Room service appropriate? Yes   Fluid consistency: Thin      05/03/24 0708            Objective: Vitals:   05/03/24 2137 05/03/24 2145 05/04/24 0512 05/04/24 0819  BP:  (!) 152/96 122/79   Pulse:  79 68   Resp:  20 19   Temp:  98.6 F (37 C) 98.5 F (36.9 C)   TempSrc:  Oral Oral   SpO2: 98% 93% 95% 94%  Weight:      Height:        Intake/Output Summary (Last 24 hours) at 05/04/2024 1124 Last data filed at 05/04/2024 1017 Gross per 24 hour  Intake 460 ml  Output 675 ml  Net -215 ml   Filed Weights   04/24/24 1426  Weight: 122.5 kg    Scheduled Meds:  acetaminophen   1,000 mg Oral Q6H   amLODipine   5 mg Oral Daily   apixaban   5 mg Oral BID   atorvastatin   20 mg Oral Daily   Chlorhexidine  Gluconate Cloth  6 each Topical Daily   gabapentin   200 mg Oral TID   hydrALAZINE   25 mg Oral Q8H   insulin  aspart  0-9 Units Subcutaneous TID WC   insulin  aspart  4 Units Subcutaneous TID WC   ipratropium-albuterol   3 mL Nebulization TID   lactose free nutrition  237 mL Oral BID BM   lidocaine   1 patch Transdermal Q24H   losartan   100 mg Oral Daily   methocarbamol   1,000 mg Oral QID   metoprolol  succinate  100 mg Oral Daily   pantoprazole   40 mg Oral Daily   polyethylene glycol  17 g Oral BID   traMADol   50 mg Oral TID   Continuous Infusions:  Nutritional status     Body mass index is 39.87 kg/m.  Data Reviewed:   CBC: Recent Labs  Lab 04/28/24 0520 04/29/24 0458 04/30/24 0522 05/01/24 0504 05/02/24 0515  WBC 9.9 8.0 7.7 7.7 8.7  HGB 12.8 12.3 12.7 12.0 12.3  HCT 39.8 39.4 39.2 37.9 38.4  MCV 98.0 99.7 97.8 98.2 97.0  PLT 203 199 205 194 192   Basic Metabolic Panel: Recent  Labs  Lab 04/28/24 0520 04/29/24 0458 04/30/24 0522 05/02/24 0515  NA 134* 136 136 138  K 3.7 3.8 3.8 3.7  CL 99 102 101 102  CO2 27 27 28 26   GLUCOSE 164* 188* 174* 156*  BUN 8 8 8 9   CREATININE 0.63 0.66 0.64 0.60  CALCIUM  9.7 9.6 9.6 9.6  MG  --   --   --  1.8  PHOS  --   --   --  3.3   GFR: Estimated Creatinine Clearance: 100.8 mL/min (by C-G formula based on SCr of 0.6 mg/dL). Liver Function Tests: Recent Labs  Lab 04/28/24 0520 04/29/24 0458 04/30/24 0522  AST <10* <10* <10*  ALT 5 6 6   ALKPHOS 58 68 78  BILITOT 0.4 0.4 0.4  PROT 6.9 6.8 7.0  ALBUMIN  3.1* 3.2* 3.1*  No results for input(s): LIPASE, AMYLASE in the last 168 hours. No results for input(s): AMMONIA in the last 168 hours. Coagulation Profile: No results for input(s): INR, PROTIME in the last 168 hours. Cardiac Enzymes: No results for input(s): CKTOTAL, CKMB, CKMBINDEX, TROPONINI in the last 168 hours. BNP (last 3 results) Recent Labs    03/21/24 1310 03/25/24 1938  PROBNP 2,893.0* 646.0*   HbA1C: No results for input(s): HGBA1C in the last 72 hours. CBG: Recent Labs  Lab 05/03/24 0715 05/03/24 1205 05/03/24 1556 05/03/24 2146 05/04/24 0720  GLUCAP 154* 174* 185* 251* 174*   Lipid Profile: No results for input(s): CHOL, HDL, LDLCALC, TRIG, CHOLHDL, LDLDIRECT in the last 72 hours. Thyroid Function Tests: No results for input(s): TSH, T4TOTAL, FREET4, T3FREE, THYROIDAB in the last 72 hours. Anemia Panel: No results for input(s): VITAMINB12, FOLATE, FERRITIN, TIBC, IRON, RETICCTPCT in the last 72 hours. Sepsis Labs: No results for input(s): PROCALCITON, LATICACIDVEN in the last 168 hours.  Recent Results (from the past 240 hours)  Surgical pcr screen     Status: None   Collection Time: 04/26/24  8:15 AM   Specimen: Nasal Mucosa; Nasal Swab  Result Value Ref Range Status   MRSA, PCR NEGATIVE NEGATIVE Final   Staphylococcus  aureus NEGATIVE NEGATIVE Final    Comment: (NOTE) The Xpert SA Assay (FDA approved for NASAL specimens in patients 75 years of age and older), is one component of a comprehensive surveillance program. It is not intended to diagnose infection nor to guide or monitor treatment. Performed at [facility], [address], [place], [place] [address]          Radiology Studies: No results found.         LOS: 10 days   Time spent= 35 mins    Burgess JAYSON Dare, MD [facility]  If 7PM-7AM, please contact night-coverage  05/04/2024, 11:24 AM

## 2024-05-04 NOTE — Progress Notes (Signed)
° ° °  8 Days Post-Op  Subjective: Patient reports that she is having some bowel movements now. Abd pain is improving. She denies any nausea or vomiting. States she doesn't feel comfortable with her ostomy yet Objective: Vital signs in last 24 hours: Temp:  [98 F (36.7 C)-98.6 F (37 C)] 98.5 F (36.9 C) (12/13 0512) Pulse Rate:  [68-79] 68 (12/13 0512) Resp:  [18-20] 19 (12/13 0512) BP: (122-152)/(62-96) 122/79 (12/13 0512) SpO2:  [93 %-98 %] 94 % (12/13 0819) Last BM Date : 05/03/24  Intake/Output from previous day: 12/12 0701 - 12/13 0700 In: 440 [P.O.:440] Out: 575 [Urine:550; Stool:25] Intake/Output this shift: No intake/output data recorded.  PE: Abd: soft, appropriately tender, obese, ND, midline wound is clean with healthy appearing tissue  colostomy flush to the skin and is without necrotic tissue, liquid stool present  Lab Results:  Recent Labs    05/02/24 0515  WBC 8.7  HGB 12.3  HCT 38.4  PLT 192   BMET Recent Labs    05/02/24 0515  NA 138  K 3.7  CL 102  CO2 26  GLUCOSE 156*  BUN 9  CREATININE 0.60  CALCIUM  9.6   PT/INR No results for input(s): LABPROT, INR in the last 72 hours. CMP     Component Value Date/Time   NA 138 05/02/2024 0515   K 3.7 05/02/2024 0515   CL 102 05/02/2024 0515   CO2 26 05/02/2024 0515   GLUCOSE 156 (H) 05/02/2024 0515   BUN 9 05/02/2024 0515   CREATININE 0.60 05/02/2024 0515   CALCIUM  9.6 05/02/2024 0515   PROT 7.0 04/30/2024 0522   ALBUMIN  3.1 (L) 04/30/2024 0522   AST <10 (L) 04/30/2024 0522   ALT 6 04/30/2024 0522   ALKPHOS 78 04/30/2024 0522   BILITOT 0.4 04/30/2024 0522   GFRNONAA >60 05/02/2024 0515   GFRAA >60 04/30/2019 0918   Lipase     Component Value Date/Time   LIPASE 18 04/24/2024 1444       Studies/Results: No results found.  Anti-infectives: Anti-infectives (From admission, onward)    Start     Dose/Rate Route Frequency Ordered Stop   04/25/24 0300  piperacillin -tazobactam  (ZOSYN ) IVPB 3.375 g        3.375 g 12.5 mL/hr over 240 Minutes Intravenous Every 8 hours 04/24/24 2338 05/01/24 0601   04/24/24 2100  piperacillin -tazobactam (ZOSYN ) IVPB 3.375 g        3.375 g 100 mL/hr over 30 Minutes Intravenous  Once 04/24/24 2053 04/24/24 2142        Assessment/Plan POD 8, s/p  Hartmann's procedure by Dr. Rubin 12/5 for diverticulitis - continue scheduled tylenol  and robaxin  and oxycodone  PRN for pain control, IV dilaudid  for breakthrough pain.  - continue soft diet  - WOC following, will need referral to ostomy clinic, pt feels she is not yet comfortable with ostomy and would like another session on Mon - mobilize and ambulate as able, PT following - continue BID dressing changes to midline wound.  Could do wound vac if patient prefers - ID: [email or web address] -completed course - labs and vitals stable  FEN - soft VTE -  DOAC for mesenteric thrombosis    - per TRH -  Portal vein thrombosis CHFpEF COPD HTN Obesity DM    LOS: 10 days    Bernarda JAYSON Ned, MD  Colorectal and General Surgery [facility]

## 2024-05-05 LAB — CBC
HCT: 38.4 % (ref 36.0–46.0)
Hemoglobin: 12.3 g/dL (ref 12.0–15.0)
MCH: 31.5 pg (ref 26.0–34.0)
MCHC: 32 g/dL (ref 30.0–36.0)
MCV: 98.5 fL (ref 80.0–100.0)
Platelets: 172 K/uL (ref 150–400)
RBC: 3.9 MIL/uL (ref 3.87–5.11)
RDW: 13.7 % (ref 11.5–15.5)
WBC: 7.8 K/uL (ref 4.0–10.5)
nRBC: 0 % (ref 0.0–0.2)

## 2024-05-05 LAB — BASIC METABOLIC PANEL WITH GFR
Anion gap: 9 (ref 5–15)
BUN: 10 mg/dL (ref 8–23)
CO2: 25 mmol/L (ref 22–32)
Calcium: 9.1 mg/dL (ref 8.9–10.3)
Chloride: 101 mmol/L (ref 98–111)
Creatinine, Ser: 0.66 mg/dL (ref 0.44–1.00)
GFR, Estimated: >60 mL/min (ref >60)
Glucose, Bld: 234 mg/dL — ABNORMAL HIGH (ref 70–99)
Potassium: 3.9 mmol/L (ref 3.5–5.1)
Sodium: 134 mmol/L — ABNORMAL LOW (ref 135–145)

## 2024-05-05 LAB — GLUCOSE, CAPILLARY
Glucose-Capillary: 199 mg/dL — ABNORMAL HIGH (ref 70–99)
Glucose-Capillary: 221 mg/dL — ABNORMAL HIGH (ref 70–99)
Glucose-Capillary: 173 mg/dL — ABNORMAL HIGH (ref 70–99)
Glucose-Capillary: 191 mg/dL — ABNORMAL HIGH (ref 70–99)

## 2024-05-05 NOTE — Progress Notes (Signed)
 PROGRESS NOTE    Lisa Ibarra  FMW:[id number] DOB: [birth year] DOA: 04/24/2024 PCP: Patient, No Pcp Per   Brief Narrative: 63 year old with past medical history significant for COPD, chronic heart failure preserved ejection fraction, hypertension, hyperlipidemia, diabetes type 2, chronic pain syndrome, class II obesity, tobacco and marijuana use presented to office last 3 with abdominal pain.  She was previously admitted from 69 3 until 6 7 for acute diverticulitis with bacteremia with Pseudomonas and E. coli.  She returned 11/19 through day 26 with diverticulitis with microperforation and possible abscess that was treated with antibiotics.  She was also found to have portal vein thrombosis and started on Eliquis .  She returned to the ER due to persistent abdominal pain and having run out of antibiotics 3-day prior to admission.  CT with severe sigmoid diverticulitis with perforated diverticulitis and progressive phlegmon in the distal sigmoid mesentery with extraluminal gas and septic thrombophlebitis in the inferior mesenteric vein.  She was treated with Dilaudid , Zofran  and Zosyn  and surgery was consulted.  Underwent exploratory laparotomy 12/5 for perforated diverticulitis status post ostomy creation.      Assessment & Plan:   Principal Problem:   Acute diverticulitis Active Problems:   Essential hypertension   Diabetes mellitus (HCC)   COPD (chronic obstructive pulmonary disease) (HCC)   Chronic heart failure with preserved ejection fraction (HFpEF) (HCC)   Diverticulitis   Hypoxemia   Portal vein thrombosis   Class 2 obesity due to excess calories with body mass index (BMI) of 39.0 to 39.9 in adult   Non-cardiac chest pain   Mood disorder   Dyslipidemia   1-Acute Diverticulitis complicated by perforation Status post ostomy 12/5 - Unfortunately patient developed and underwent exploratory laparotomy with sigmoid colectomy with ostomy creation 12/5 - Postoperative management per  surgery. - Plan for further  ostomy education tomorrow  Mood disorder, - Patient reports suicidal ideation 12/7, but contracts for safety.  Suspected situational in nature. Seen by psych and has been cleared  Noncardiac chest pain - Continue PPI  Chronic heart failure with preserved ejection fraction Hypoxic COPD - Echo 10/31st 2025 showed ejection fraction 60% grade 1 diastolic dysfunction. - Continue as needed bronchodilators  Diabetes uncontrolled with hyperglycemia - A1c 7.0 Continue sliding scale insulin    Portal vein thrombosis - CT 11/23 showed portal vein thrombosis Back on Eliquis   Essential hypertension - Continue Norvasc , losartan  and Toprol   Dyslipidemia - Resume atorvastatin   Class II obesity - Weight loss will be encouraged   Estimated body mass index is 39.87 kg/m as calculated from the following:   Height as of this encounter: 5' 9 (1.753 m).   Weight as of this encounter: 122.5 kg.   DVT prophylaxis: Eliquis  Code Status: Full code Family Communication: Care discussed with patient Disposition Plan:  Status is: Inpatient Remains inpatient appropriate because: Home tomorrow after follow-up by ostomy nurse    Consultants:  General surgery  Procedures:    Antimicrobials:    Subjective: Patient is alert, complaining of abdominal pain.  Objective: Vitals:   05/04/24 2019 05/04/24 2041 05/05/24 0500 05/05/24 0813  BP:  122/67 128/63   Pulse:  89 75   Resp:  19 18   Temp:  98.2 F (36.8 C) 98 F (36.7 C)   TempSrc:  Oral Oral   SpO2: 95% 95% 93% 96%  Weight:      Height:        Intake/Output Summary (Last 24 hours) at 05/05/2024 0841 Last data filed at 05/05/2024  0800 Gross per 24 hour  Intake 1050 ml  Output 400 ml  Net 650 ml   Filed Weights   04/24/24 1426  Weight: 122.5 kg    Examination:  General exam: Appears calm and comfortable  Respiratory system: Clear to auscultation. Respiratory effort  normal. Cardiovascular system: S1 & S2 heard, RRR. No JVD, murmurs, rubs, gallops or clicks. No pedal edema. Gastrointestinal system: Abdomen is nondistended, soft and ostomy in place wound: Central nervous system: Alert and oriented. No focal neurological deficits. Extremities: Symmetric 5 x 5 power. Skin: No rashes, lesions or ulcers Psychiatry: Judgement and insight appear normal. Mood & affect appropriate.     Data Reviewed: I have personally reviewed following labs and imaging studies  CBC: Recent Labs  Lab 04/29/24 0458 04/30/24 0522 05/01/24 0504 05/02/24 0515  WBC 8.0 7.7 7.7 8.7  HGB 12.3 12.7 12.0 12.3  HCT 39.4 39.2 37.9 38.4  MCV 99.7 97.8 98.2 97.0  PLT 199 205 194 192   Basic Metabolic Panel: Recent Labs  Lab 04/29/24 0458 04/30/24 0522 05/02/24 0515  NA 136 136 138  K 3.8 3.8 3.7  CL 102 101 102  CO2 27 28 26   GLUCOSE 188* 174* 156*  BUN 8 8 9   CREATININE 0.66 0.64 0.60  CALCIUM  9.6 9.6 9.6  MG  --   --  1.8  PHOS  --   --  3.3   GFR: Estimated Creatinine Clearance: 100.8 mL/min (by C-G formula based on SCr of 0.6 mg/dL). Liver Function Tests: Recent Labs  Lab 04/29/24 0458 04/30/24 0522  AST <10* <10*  ALT 6 6  ALKPHOS 68 78  BILITOT 0.4 0.4  PROT 6.8 7.0  ALBUMIN  3.2* 3.1*   No results for input(s): LIPASE, AMYLASE in the last 168 hours. No results for input(s): AMMONIA in the last 168 hours. Coagulation Profile: No results for input(s): INR, PROTIME in the last 168 hours. Cardiac Enzymes: No results for input(s): CKTOTAL, CKMB, CKMBINDEX, TROPONINI in the last 168 hours. BNP (last 3 results) Recent Labs    03/21/24 1310 03/25/24 1938  PROBNP 2,893.0* 646.0*   HbA1C: No results for input(s): HGBA1C in the last 72 hours. CBG: Recent Labs  Lab 05/04/24 0720 05/04/24 1201 05/04/24 1609 05/04/24 2037 05/05/24 0734  GLUCAP 174* 172* 236* 173* 199*   Lipid Profile: No results for input(s): CHOL, HDL,  LDLCALC, TRIG, CHOLHDL, LDLDIRECT in the last 72 hours. Thyroid Function Tests: No results for input(s): TSH, T4TOTAL, FREET4, T3FREE, THYROIDAB in the last 72 hours. Anemia Panel: No results for input(s): VITAMINB12, FOLATE, FERRITIN, TIBC, IRON, RETICCTPCT in the last 72 hours. Sepsis Labs: No results for input(s): PROCALCITON, LATICACIDVEN in the last 168 hours.  Recent Results (from the past 240 hours)  Surgical pcr screen     Status: None   Collection Time: 04/26/24  8:15 AM   Specimen: Nasal Mucosa; Nasal Swab  Result Value Ref Range Status   MRSA, PCR NEGATIVE NEGATIVE Final   Staphylococcus aureus NEGATIVE NEGATIVE Final    Comment: (NOTE) The Xpert SA Assay (FDA approved for NASAL specimens in patients 70 years of age and older), is one component of a comprehensive surveillance program. It is not intended to diagnose infection nor to guide or monitor treatment. Performed at [facility], [address] [place], [place] [address]          Radiology Studies: No results found.      Scheduled Meds:  acetaminophen   1,000 mg Oral  Q6H   amLODipine   5 mg Oral Daily   apixaban   5 mg Oral BID   atorvastatin   20 mg Oral Daily   Chlorhexidine  Gluconate Cloth  6 each Topical Daily   gabapentin   200 mg Oral TID   hydrALAZINE   25 mg Oral Q8H   insulin  aspart  0-9 Units Subcutaneous TID WC   insulin  aspart  4 Units Subcutaneous TID WC   ipratropium-albuterol   3 mL Nebulization TID   lidocaine   1 patch Transdermal Q24H   losartan   100 mg Oral Daily   methocarbamol   1,000 mg Oral QID   metoprolol  succinate  100 mg Oral Daily   pantoprazole   40 mg Oral Daily   polyethylene glycol  17 g Oral BID   traMADol   50 mg Oral TID   Continuous Infusions:   LOS: 11 days    Time spent: 35 Minutes    Velisa Regnier A Saagar Tortorella, MD [facility]   If 7PM-7AM, please contact night-coverage [email or web address]  05/05/2024, 8:41  AM

## 2024-05-06 ENCOUNTER — Other Ambulatory Visit (HOSPITAL_COMMUNITY): Payer: Self-pay

## 2024-05-06 LAB — GLUCOSE, CAPILLARY
Glucose-Capillary: 186 mg/dL — ABNORMAL HIGH (ref 70–99)
Glucose-Capillary: 200 mg/dL — ABNORMAL HIGH (ref 70–99)

## 2024-05-06 MED ORDER — ACETAMINOPHEN 500 MG PO TABS
1000.0000 mg | ORAL_TABLET | Freq: Four times a day (QID) | ORAL | 0 refills | Status: AC
  Filled 2024-05-06: qty 30, 4d supply, fill #0

## 2024-05-06 MED ORDER — INSULIN GLARGINE 100 UNITS/ML SOLOSTAR PEN
15.0000 [IU] | PEN_INJECTOR | SUBCUTANEOUS | 11 refills | Status: AC
  Filled 2024-05-06: qty 15, 100d supply, fill #0

## 2024-05-06 MED ORDER — METHOCARBAMOL 500 MG PO TABS
1000.0000 mg | ORAL_TABLET | Freq: Four times a day (QID) | ORAL | 0 refills | Status: AC
  Filled 2024-05-06: qty 120, 15d supply, fill #0

## 2024-05-06 MED ORDER — INSULIN GLARGINE 100 UNITS/ML SOLOSTAR PEN: 15.0000 [IU] | PEN_INJECTOR | SUBCUTANEOUS | Status: DC

## 2024-05-06 MED ORDER — OXYCODONE HCL 5 MG PO TABS
5.0000 mg | ORAL_TABLET | Freq: Four times a day (QID) | ORAL | 0 refills | Status: AC | PRN
  Filled 2024-05-06: qty 24, 3d supply, fill #0

## 2024-05-06 MED ORDER — INSULIN PEN NEEDLE 32G X 6 MM MISC
1.0000 | Freq: Three times a day (TID) | 0 refills | Status: AC
  Filled 2024-05-06: qty 100, 30d supply, fill #0

## 2024-05-06 MED ORDER — APIXABAN 5 MG PO TABS
5.0000 mg | ORAL_TABLET | Freq: Two times a day (BID) | ORAL | 3 refills | Status: AC
  Filled 2024-05-06 – 2024-06-11 (×2): qty 60, 30d supply, fill #0

## 2024-05-06 MED ORDER — PANTOPRAZOLE SODIUM 40 MG PO TBEC
40.0000 mg | DELAYED_RELEASE_TABLET | Freq: Every day | ORAL | 0 refills | Status: AC
  Filled 2024-05-06: qty 30, 30d supply, fill #0

## 2024-05-06 MED ORDER — TRAMADOL HCL 50 MG PO TABS
50.0000 mg | ORAL_TABLET | Freq: Three times a day (TID) | ORAL | 0 refills | Status: AC | PRN
  Filled 2024-05-06: qty 21, 7d supply, fill #0

## 2024-05-06 MED ORDER — GABAPENTIN 100 MG PO CAPS
200.0000 mg | ORAL_CAPSULE | Freq: Three times a day (TID) | ORAL | 0 refills | Status: DC
  Filled 2024-05-06: qty 30, 5d supply, fill #0

## 2024-05-06 MED ORDER — INSULIN GLARGINE 100 UNIT/ML ~~LOC~~ SOLN
15.0000 [IU] | Freq: Every day | SUBCUTANEOUS | Status: DC
  Administered 2024-05-06: 12:00:00 15 [IU] via SUBCUTANEOUS
  Filled 2024-05-06: qty 0.15

## 2024-05-06 MED ORDER — POLYETHYLENE GLYCOL 3350 17 GM/SCOOP PO POWD
17.0000 g | Freq: Two times a day (BID) | ORAL | 0 refills | Status: AC
  Filled 2024-05-06 (×2): qty 238, 7d supply, fill #0

## 2024-05-06 MED ORDER — IPRATROPIUM-ALBUTEROL 0.5-2.5 (3) MG/3ML IN SOLN: 3.0000 mL | Freq: Two times a day (BID) | RESPIRATORY_TRACT | Status: DC

## 2024-05-06 NOTE — Progress Notes (Signed)
 10 Days Post-Op   Subjective/Chief Complaint: No complaints   Objective: Vital signs in last 24 hours: Temp:  [98 F (36.7 C)-98.3 F (36.8 C)] 98 F (36.7 C) (12/15 0613) Pulse Rate:  [71-72] 72 (12/15 0613) Resp:  [18-20] 20 (12/15 0613) BP: (125-129)/(78-106) 129/85 (12/15 0613) SpO2:  [95 %-98 %] 97 % (12/15 0825) Last BM Date : 05/05/24  Intake/Output from previous day: 12/14 0701 - 12/15 0700 In: 240 [P.O.:240] Out: 225 [Stool:225] Intake/Output this shift: No intake/output data recorded.  General appearance: alert and cooperative Resp: clear to auscultation bilaterally Cardio: regular rate and rhythm GI: soft, mild tenderness. Ostomy pink and productive. Wound clean  Lab Results:  Recent Labs    05/05/24 1003  WBC 7.8  HGB 12.3  HCT 38.4  PLT 172   BMET Recent Labs    05/05/24 1003  NA 134*  K 3.9  CL 101  CO2 25  GLUCOSE 234*  BUN 10  CREATININE 0.66  CALCIUM  9.1   PT/INR No results for input(s): LABPROT, INR in the last 72 hours. ABG No results for input(s): PHART, HCO3 in the last 72 hours.  Invalid input(s): PCO2, PO2  Studies/Results: No results found.  Anti-infectives: Anti-infectives (From admission, onward)    Start     Dose/Rate Route Frequency Ordered Stop   04/25/24 0300  piperacillin -tazobactam (ZOSYN ) IVPB 3.375 g        3.375 g 12.5 mL/hr over 240 Minutes Intravenous Every 8 hours 04/24/24 2338 05/01/24 0601   04/24/24 2100  piperacillin -tazobactam (ZOSYN ) IVPB 3.375 g        3.375 g 100 mL/hr over 30 Minutes Intravenous  Once 04/24/24 2053 04/24/24 2142       Assessment/Plan: s/p Procedures with comments: LAPAROTOMY, EXPLORATORY (N/A) COLECTOMY, PARTIAL, CREATION COLOSTOMY, SIGMOID COLECTOMY (N/A) - COLOSTOMY Advance diet Ostomy care Continue wet to dry dressings at home 1-2 times per day after showering Follow up with surgery in 2 weeks Ok for d/c POD 9, s/p  Hartmann's procedure by Dr. Rubin  12/5 for diverticulitis - continue scheduled tylenol  and robaxin  and oxycodone  PRN for pain control, IV dilaudid  for breakthrough pain.  - continue soft diet  - WOC following, will need referral to ostomy clinic, pt feels she is not yet comfortable with ostomy and would like another session on Mon - mobilize and ambulate as able, PT following - continue BID dressing changes to midline wound.   - ID: [email or web address] -completed course - labs and vitals stable   FEN - soft VTE -  DOAC for mesenteric thrombosis       - per TRH -  Portal vein thrombosis CHFpEF COPD HTN Obesity DM  LOS: 12 days    Lisa Ibarra 05/06/2024

## 2024-05-06 NOTE — Progress Notes (Signed)
 Discharge pain med in a secure bag delivered to patient in room by this RN

## 2024-05-06 NOTE — Progress Notes (Signed)
 Discharge meds in a secure bag delivered to patient in room by this RN

## 2024-05-06 NOTE — Consult Note (Signed)
 WOC Nurse Ostomy follow up visit, patient feels confident in caring for her ostomy now, CG- daughter in room, current colostomy appliance intact, no need for change, placed barrier strips around tape tabs of ostomy appliance, recommend convex 1-piece with barrier ring along side of deep abd crease at 9 o'clock and around stoma that is at skin level, reviewed frequency of change out as every 3-4 days, skin care, had reviewed crusting technique with patient, outpatient ostomy clinic information- patient stated she will be calling if issues arise, reviewed when to call a provider, verbalized understanding. No other needs as this patient is discharging today once pharmacy drops off meds.   Sherrilyn Hals MSN RN CWOCN WOC [facility]  [phone] (Available from 7-3 pm Mon-Friday)

## 2024-05-06 NOTE — TOC Transition Note (Signed)
 Transition of Care [facility]) - Discharge Note   Patient Details  Name: Lisa Ibarra MRN: [id number] Date of Birth: January 09, 1961  Transition of Care [facility] CM/SW Contact:  Bascom Service, RN Phone Number: 05/06/2024, 10:32 AM   Clinical Narrative:  Spoke to dtr [name] about d/c plans-HHC/dme all set up see below. Has own transport home. No further CM needs.     Final next level of care: Home w Home Health Services Barriers to Discharge: No Barriers Identified   Patient Goals and CMS Choice Patient states their goals for this hospitalization and ongoing recovery are:: Home CMS Medicare.gov Compare Post Acute Care list provided to:: Patient Represenative (must comment) Choice offered to / list presented to : Adult Children [place] ownership interest in [facility].provided to:: Adult Children    Discharge Placement                       Discharge Plan and Services Additional resources added to the After Visit Summary for     Discharge Planning Services: CM Consult Post Acute Care Choice: Home Health          DME Arranged: 3-N-1 DME Agency: [employer] Date DME Agency Contacted: 05/06/24 Time DME Agency Contacted: 1031 Representative spoke with at DME Agency: [place] HH Arranged: RN, PT HH Agency: [facility] Date [facility] Agency Contacted: 05/06/24 Time HH Agency Contacted: 1032 Representative spoke with at [facility] Agency: [place]  Social Drivers of Health (SDOH) Interventions SDOH Screenings   Food Insecurity: No Food Insecurity (04/25/2024)  Housing: Low Risk (04/25/2024)  Transportation Needs: No Transportation Needs (04/25/2024)  Utilities: Not At Risk (04/25/2024)  Financial Resource Strain: Low Risk (10/27/2023)   Received from [facility]  Physical Activity: Unknown (10/27/2023)   Received from [facility]  Social Connections: Moderately Integrated (10/27/2023)   Received from [facility]  Stress: Stress Concern Present (10/27/2023)    Received from [facility]  Tobacco Use: High Risk (04/24/2024)   Received from [facility]     Readmission Risk Interventions    04/12/2024   10:17 AM 03/24/2024   12:01 PM 03/22/2024    1:37 PM  Readmission Risk Prevention Plan  Post Dischage Appt  Complete   Medication Screening  Complete   Transportation Screening Complete Complete Complete  PCP or Specialist Appt within 5-7 Days   Complete  PCP or Specialist Appt within 3-5 Days Complete    Home Care Screening   Complete  Medication Review (RN CM)   Complete  HRI or Home Care Consult Complete    Social Work Consult for Recovery Care Planning/Counseling Complete    Palliative Care Screening Not Applicable    Medication Review Oceanographer) Complete

## 2024-05-06 NOTE — Plan of Care (Signed)

## 2024-05-06 NOTE — Discharge Summary (Signed)
 Physician Discharge Summary   Patient: Lisa Ibarra MRN: [id number] DOB: 05/02/1961  Admit date:     04/24/2024  Discharge date: 05/06/2024  Discharge Physician: Owen DELENA Lore   PCP: Patient, No Pcp Per   Recommendations at discharge:   Need follow up with Surgery for post op care.  Follow up with Dr Timmy for portal  vein thrombosis.  Follow up with PCP for chronic medical problems.   Discharge Diagnoses: Principal Problem:   Acute diverticulitis Active Problems:   Essential hypertension   Diabetes mellitus (HCC)   COPD (chronic obstructive pulmonary disease) (HCC)   Chronic heart failure with preserved ejection fraction (HFpEF) (HCC)   Diverticulitis   Hypoxemia   Portal vein thrombosis   Class 2 obesity due to excess calories with body mass index (BMI) of 39.0 to 39.9 in adult   Non-cardiac chest pain   Mood disorder   Dyslipidemia  Resolved Problems:   * No resolved hospital problems. *  Hospital Course: 63 year old with past medical history significant for COPD, chronic heart failure preserved ejection fraction, hypertension, hyperlipidemia, diabetes type 2, chronic pain syndrome, class II obesity, tobacco and marijuana use presented to office last 3 with abdominal pain.  She was previously admitted from 20 3 until 90 7 for acute diverticulitis with bacteremia with Pseudomonas and E. coli.  She returned 11/19 through day 26 with diverticulitis with microperforation and possible abscess that was treated with antibiotics.  She was also found to have portal vein thrombosis and started on Eliquis .  She returned to the ER due to persistent abdominal pain and having run out of antibiotics 3-day prior to admission.  CT with severe sigmoid diverticulitis with perforated diverticulitis and progressive phlegmon in the distal sigmoid mesentery with extraluminal gas and septic thrombophlebitis in the inferior mesenteric vein.  She was treated with Dilaudid , Zofran  and Zosyn  and  surgery was consulted.  Underwent exploratory laparotomy 12/5 for perforated diverticulitis status post ostomy creation.    Assessment and Plan:  1-Acute Diverticulitis complicated by perforation Status post ostomy 12/5 - Unfortunately patient developed and underwent exploratory laparotomy with sigmoid colectomy with ostomy creation 12/5 - Postoperative management per surgery. - stable for discharge  Mood disorder, - Patient reports suicidal ideation 12/7, but contracts for safety.  Suspected situational in nature. Seen by psych and has been cleared   Noncardiac chest pain - Continue PPI   Chronic heart failure with preserved ejection fraction Hypoxic COPD - Echo 10/31st 2025 showed ejection fraction 60% grade 1 diastolic dysfunction. - Continue as needed bronchodilators   Diabetes uncontrolled with hyperglycemia - A1c 7.0 Continue sliding scale insulin  Discharge on daily lantus    Portal vein thrombosis - CT 11/23 showed portal vein thrombosis Back on Eliquis    Essential hypertension - Continue Norvasc , losartan  and Toprol    Dyslipidemia - Resume atorvastatin    Class II obesity - Weight loss will be encouraged         Consultants: Surgery  Procedures performed: Underwent exploratory laparotomy 12/5 for perforated diverticulitis status post ostomy creation.   Disposition: Home Diet recommendation:  Carb modified diet DISCHARGE MEDICATION: Allergies as of 05/06/2024       Reactions   Bee Venom Anaphylaxis, Hives   Empagliflozin Itching, Rash, Dermatitis   Ibuprofen Nausea And Vomiting   Statins Nausea Only, Other (See Comments)   Muscle spasms and cramps, too        Medication List     STOP taking these medications    ciprofloxacin  500 MG  tablet Commonly known as: CIPRO    metroNIDAZOLE  500 MG tablet Commonly known as: FLAGYL        TAKE these medications    Acetaminophen  Extra Strength 500 MG Tabs Take 2 tablets (1,000 mg total) by mouth  every 6 (six) hours. Notes to patient: 5 am, 11 am, 5 pm, 11 pm   albuterol  108 (90 Base) MCG/ACT inhaler Commonly known as: VENTOLIN  HFA Inhale 2 puffs into the lungs every 6 (six) hours as needed for wheezing or shortness of breath.   amLODipine  5 MG tablet Commonly known as: NORVASC  Take 5 mg by mouth daily.   apixaban  5 MG Tabs tablet Commonly known as: ELIQUIS  Take 1 tablet (5 mg total) by mouth 2 (two) times daily. What changed: See the new instructions. Notes to patient: 8 am & 8 pm   arformoterol  15 MCG/2ML Nebu Commonly known as: BROVANA  Take 2 mLs (15 mcg total) by nebulization 2 (two) times daily.   atorvastatin  20 MG tablet Commonly known as: LIPITOR Take 20 mg by mouth daily.   Basaglar  KwikPen 100 UNIT/ML Inject 15 Units into the skin daily.   budesonide  0.5 MG/2ML nebulizer solution Commonly known as: PULMICORT  Take 2 mLs (0.5 mg total) by nebulization 2 (two) times daily.   furosemide  20 MG tablet Commonly known as: LASIX  Take 20-40 mg by mouth daily as needed for fluid or edema.   gabapentin  100 MG capsule Commonly known as: NEURONTIN  Take 2 capsules (200 mg total) by mouth 3 (three) times daily.   Goodys Extra Strength 500-325-65 MG Pack Generic drug: Aspirin-Acetaminophen -Caffeine  Take 1 packet by mouth See admin instructions. Dissolve 1 packet in the mouth up to six times a day as needed for pain   hydrALAZINE  25 MG tablet Commonly known as: APRESOLINE  Take 1 tablet (25 mg total) by mouth every 8 (eight) hours. Notes to patient: 5 am, 1 pm, 9 pm   Insulin  Pen Needle 29G X Misc For insulin  injection What changed: Another medication with the same name was added. Make sure you understand how and when to take each.   TechLite Pen Needles 32G X 6 MM Misc Generic drug: Insulin  Pen Needle Use to inject insulin  3 times daily as directed What changed: You were already taking a medication with the same name, and this prescription was added. Make  sure you understand how and when to take each.   losartan  100 MG tablet Commonly known as: COZAAR  Take 1 tablet (100 mg total) by mouth daily.   methocarbamol  500 MG tablet Commonly known as: ROBAXIN  Take 2 tablets (1,000 mg total) by mouth 4 (four) times daily for 15 days.   metoprolol  succinate 100 MG 24 hr tablet Commonly known as: TOPROL -XL Take 1 tablet (100 mg total) by mouth daily. Take with or immediately following a meal.   oxyCODONE  5 MG immediate release tablet Commonly known as: Oxy IR/ROXICODONE  Take 1-2 tablets (5-10 mg total) by mouth every 6 (six) hours as needed for up to 3 days for moderate pain (pain score 4-6) or severe pain (pain score 7-10) (5mg  mod, 10mg  severe). Do not take it at same time with tramadol  What changed:  how much to take reasons to take this additional instructions   Ozempic (0.25 or 0.5 MG/DOSE) 2 MG/3ML Sopn Generic drug: Semaglutide(0.25 or 0.5MG /DOS) Inject 0.25 mg into the skin every Sunday.   pantoprazole  40 MG tablet Commonly known as: PROTONIX  Take 1 tablet (40 mg total) by mouth daily. Start taking on: May 07, 2024  polyethylene glycol powder 17 GM/SCOOP powder Commonly known as: GLYCOLAX /MIRALAX  Take 17 g by mouth 2 (two) times daily. Dissolve 1 capful (17g) in 4-8 ounces of liquid and take by mouth daily.   revefenacin  175 MCG/3ML nebulizer solution Commonly known as: YUPELRI  Take 3 mLs (175 mcg total) by nebulization daily.   traMADol  50 MG tablet Commonly known as: ULTRAM  Take 1 tablet (50 mg total) by mouth 3 (three) times daily as needed for up to 7 days.               Durable Medical Equipment  (From admission, onward)           Start     Ordered   04/29/24 1455  For home use only DME 3 n 1  Once        04/29/24 1454              Discharge Care Instructions  (From admission, onward)           Start     Ordered   05/06/24 0000  Discharge wound care:       Comments: See above    05/06/24 9046            Contact information for follow-up providers     [facility] Follow up.   Specialty: Urgent Care Why: outpatient psychotherapy Contact information: [address] [place]  [address] [phone]        Rubin Calamity, MD Follow up in 3 week(s).   Specialty: General Surgery Why: Office will call you with a follow up appointment, If you don't hear from the office, please call, Arrive 30 minutes prior to your appointment time, Please bring your insurance card and photo ID Contact information: [address] [place] [address] [phone]              Contact information for after-discharge care     Durable Medical Equipment     [facility] (DME) Follow up.   Service: Durable Medical Equipment Why: bedside commode Contact information: [address] [place]  [address] [phone]             Home Medical Care     [facility]) .   Service: Home Health Services Why: HHRN-ostomy instruction/PT Contact information: [address] [place]  [address] [phone]                    Discharge Exam: Fredricka Weights   04/24/24 1426  Weight: 122.5 kg   General; NAD  Condition at discharge: stable  The results of significant diagnostics from this hospitalization (including imaging, microbiology, ancillary and laboratory) are listed below for reference.   Imaging Studies: CT ABDOMEN PELVIS W CONTRAST Result Date: 04/24/2024 EXAM: CT ABDOMEN AND PELVIS WITH CONTRAST 04/24/2024 07:02:00 PM TECHNIQUE: CT of the abdomen and pelvis was performed with the administration of 100 mL iohexol  (OMNIPAQUE ) 300 MG/ML solution. Multiplanar reformatted images are provided for review. Automated exposure control, iterative reconstruction, and/or weight-based adjustment of the mA/kV was  utilized to reduce the radiation dose to as low as reasonably achievable. COMPARISON: Chest CT examination of 08/18/2021. CLINICAL HISTORY: Abdominal pain, acute, nonlocalized; Recent diverticular abscess, worsening sx. FINDINGS: LOWER CHEST: No acute abnormality. LIVER: The liver is unremarkable. 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 The vessel  remains patent. GALLBLADDER AND BILE DUCTS: Gallbladder is unremarkable. No biliary ductal dilatation. SPLEEN: No acute abnormality. PANCREAS: No acute abnormality. ADRENAL GLANDS: 13 mm right adrenal nodule is indeterminate on the current examination, but was better assessed on chest CT examination of 08/18/2021 where this was compatible with a benign adrenal adenoma. This is unchanged in size and further follow-up is not required. The left adrenal gland is unremarkable. KIDNEYS, URETERS AND BLADDER: Kidneys are normal in size and position. Multiple punctate nonobstructing calculi are seen within the left kidney with a dominant 9 mm calculus within the lower pole of the left kidney. No hydronephrosis. No ureteral calculi. A subcortical cyst is seen within the right kidney, for which no follow-up imaging is recommended. The bladder is partially obscured by streak artifact; however, the visualized portion is unremarkable. GI AND BOWEL: Severe sigmoid diverticulosis. Changes of perforated diverticulitis again noted. Extensive inflammatory tissue within the distal sigmoid mesentery with extraluminal gas is again identified and has progressed in terms of density and overall size with the phlegmonous collection now measuring at least 5.4 x 7.8 x 5.5 cm in overall size. Extension of extraluminal gas into the inferior mesenteric vein peripherally again noted in keeping with changes of septic thrombophlebitis. No organized, drainable fluid collection identified. The inflammatory process  encases the inferior mesenteric artery distally (5 1/2), however, no active extravasation is identified. Circumferential wall thickening involving the adjacent distal sigmoid colon is again noted, however, there is no evidence of obstruction. The stomach, small bowel, and large bowel are otherwise unremarkable. The appendix is normal. PERITONEUM AND RETROPERITONEUM: No gross free intraperitoneal gas or fluid. VASCULATURE: Aorta is normal in caliber. Mild aortoiliac atherosclerotic calcification. No aortic aneurysm. LYMPH NODES: No lymphadenopathy. REPRODUCTIVE ORGANS: Streak artifact limits evaluation of the pelvis; however, changes of hysterectomy are suspected. No definite adnexal mass. BONES AND SOFT TISSUES: Status post left total hip arthroplasty. Advanced degenerative changes are seen within the lumbar spine. Remote L1 superior endplate fracture. Degenerative ankylosis L5-S1. Small fat-containing umbilical hernia. No acute bone abnormality. No focal soft tissue abnormality. IMPRESSION: 1. Severe sigmoid diverticulosis with perforated diverticulitis demonstrating progressive phlegmon in the distal sigmoid mesentery and extraluminal gas with septic thrombophlebitis involving peripheral branches of the inferior mesenteric vein. No organized drainable fluid collection. No bowel obstruction. 2. 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 is not as well visualized on the current examination Electronically signed by: Dorethia Molt MD 04/24/2024 08:29 PM EST RP Workstation: [id number]   CT ABDOMEN PELVIS W CONTRAST Result Date: 04/14/2024 EXAM: CT ABDOMEN AND PELVIS WITH CONTRAST 04/14/2024 12:16:51 PM TECHNIQUE: CT of the abdomen and pelvis was performed with the administration of 100 mL iohexol  (OMNIPAQUE ) 300 MG/ML solution. Multiplanar reformatted images are provided for review. Automated exposure control, iterative reconstruction, and/or weight-based adjustment of the mA/kV  was utilized to reduce the radiation dose to as low as reasonably achievable. COMPARISON: Compared to a previous examination. CLINICAL HISTORY: Diverticulitis, complication suspected. FINDINGS: LOWER CHEST: Areas of subsegmental, platelike atelectasis are noted within both lung bases. LIVER: No suspicious liver abnormality. There is a low attenuation filling defect within the left hepatic lobe branch of the portal vein , extrahepatic portal vein, portal venous confluence, and smv concerning for thrombus. Several foci of gas appear trapped within the thrombus. GALLBLADDER AND BILE DUCTS: Gallbladder is unremarkable. No biliary ductal dilatation. SPLEEN: The spleen is within normal limits in size and appearance. PANCREAS: The pancreas is normal in size and contour without focal lesion  or ductal dilatation. ADRENAL GLANDS: Normal size and morphology bilaterally. No nodule, thickening, or hemorrhage. No periadrenal stranding. KIDNEYS, URETERS AND BLADDER: Nonobstructing left renal calculi identified. The largest is in the inferior pole measuring 8 mm, image 50/2. Bilateral Bosniak class 1 and 2 kidney cysts are identified. The largest arises off the anterior cortex of the right kidney measuring 3 cm, image 41/2. Per consensus, no follow-up is needed for simple Bosniak type 1 and 2 renal cysts, unless the patient has a malignancy history or risk factors. The left kidney is partially obscured by streak artifact from a left hip arthroplasty device. No hydronephrosis. No perinephric or periureteral stranding. Urinary bladder is unremarkable. GI AND BOWEL: Signs of acute, perforated diverticulitis involving the sigmoid colon is again noted as noted on the previous exam. There is a poorly defined gas and debris collection within the central pelvis adjacent to the sigmoid colon which tracts cranially up towards the left kidney terminating just medial to the inferior pole of the left kidney. This appears grossly unchanged  compared with the previous exam. No well defined peripherally enhancing fluid collection identified at this time to suggest a drainable abscess. The extraluminal collection of gas and debris measures 6.7 x 4.8 cm, image 62/2. Sinus tract extending from this area towards the lower pole of the left kidney measures approximately 7 to 8 cm in length. The appendix is visualized and normal in caliber, without wall thickening, periappendiceal inflammation, or fluid. Stomach demonstrates no acute abnormality. There is no bowel obstruction. PERITONEUM AND RETROPERITONEUM: No ascites. No free air. VASCULATURE: Aorta is normal in caliber. LYMPH NODES: No lymphadenopathy. REPRODUCTIVE ORGANS: No acute abnormality. BONES AND SOFT TISSUES: Left hip arthroplasty. Lumbar degenerative disc disease and facet arthropathy. No acute osseous abnormality. No focal soft tissue abnormality. IMPRESSION: 1. Acute, perforated diverticulitis involving the sigmoid colon with a poorly defined gas and debris collection in the central pelvis, grossly unchanged compared with the previous exam. No drainable abscess identified. 2. 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 3. Several small foci of gas identified within the portal venous system 4. Results were called to Dr. Clorinda Bury at the time of interpretation on 04/14/2024 at 12:38 pm. Electronically signed by: Waddell Calk MD 04/14/2024 12:39 PM EST RP Workstation: [id number]   CT ABDOMEN PELVIS W CONTRAST Result Date: 04/10/2024 EXAM: CT ABDOMEN AND PELVIS WITH CONTRAST 04/10/2024 05:13:38 PM TECHNIQUE: CT of the abdomen and pelvis was performed with the administration of 125 mL of iohexol  (OMNIPAQUE ) 300 MG/ML solution. Multiplanar reformatted images are provided for review. Automated exposure control, iterative reconstruction, and/or weight-based adjustment of the  mA/kV was utilized to reduce the radiation dose to as low as reasonably achievable. COMPARISON: CT abdomen and pelvis 03/25/2024. CLINICAL HISTORY: Diverticulitis, complication suspected. FINDINGS: LOWER CHEST: No acute abnormality. LIVER: The liver is unremarkable. GALLBLADDER AND BILE DUCTS: Gallbladder is unremarkable. No biliary ductal dilatation. SPLEEN: No acute abnormality. PANCREAS: No acute abnormality. ADRENAL GLANDS: No acute abnormality. KIDNEYS, URETERS AND BLADDER: No stones in the kidneys or ureters. No hydronephrosis. No perinephric or periureteral stranding. Urinary bladder is unremarkable. GI AND BOWEL: Persistent mid distal sigmoid diverticulitis with mild bowel thickening and pericolonic fat stranding superiorly. Microperforation is noted and best visualized on coronal images (8:111). There is no bowel obstruction. PERITONEUM AND RETROPERITONEUM: Interval development of a fluid and gas collection within the mid abdomen superior to  the mid sigmoid colon (8:11). No free air. VASCULATURE: Aorta is normal in caliber. LYMPH NODES: No lymphadenopathy. REPRODUCTIVE ORGANS: No acute abnormality. BONES AND SOFT TISSUES: No acute osseous abnormality. No focal soft tissue abnormality. Intervertebral disc space vacuum phenomenon. Grade 1 anterolisthesis of L4 on L5. Grade 1 anterolisthesis of L3 on L4. To a superior endplate concavity likely old healed fracture. IMPRESSION: 1. Acute complicated diverticulitis of the mid-distal sigmoid colon with microperforation and associated 5.4 x 6.1 x 9.2cm gas and fluid collection formation. Recommend colonoscopy status post treatment and status post complete resolution of inflammatory changes to exclude an underlying lesion. Electronically signed by: Morgane Naveau MD 04/10/2024 05:45 PM EST RP Workstation: [id number]   DG Chest Portable 1 View Result Date: 04/10/2024 EXAM: 1 VIEW(S) XRAY OF THE CHEST 04/10/2024 03:30:00 PM COMPARISON: Comparison 03/22/2024. Stable  cardiomediastinal silhouette. CLINICAL HISTORY: cp cp FINDINGS: LUNGS AND PLEURA: No focal pulmonary opacity. No pleural effusion. No pneumothorax. HEART AND MEDIASTINUM: Stable cardiomediastinal silhouette. BONES AND SOFT TISSUES: No acute osseous abnormality. IMPRESSION: 1. No acute cardiopulmonary disease. Electronically signed by: Lynwood Seip MD 04/10/2024 03:39 PM EST RP Workstation: [id number]    Microbiology: Results for orders placed or performed during the hospital encounter of 04/24/24  Surgical pcr screen     Status: None   Collection Time: 04/26/24  8:15 AM   Specimen: Nasal Mucosa; Nasal Swab  Result Value Ref Range Status   MRSA, PCR NEGATIVE NEGATIVE Final   Staphylococcus aureus NEGATIVE NEGATIVE Final    Comment: (NOTE) The Xpert SA Assay (FDA approved for NASAL specimens in patients 20 years of age and older), is one component of a comprehensive surveillance program. It is not intended to diagnose infection nor to guide or monitor treatment. Performed at [facility], [address], [place], [place] [address]     Labs: CBC: Recent Labs  Lab 04/30/24 0522 05/01/24 0504 05/02/24 0515 05/05/24 1003  WBC 7.7 7.7 8.7 7.8  HGB 12.7 12.0 12.3 12.3  HCT 39.2 37.9 38.4 38.4  MCV 97.8 98.2 97.0 98.5  PLT 205 194 192 172   Basic Metabolic Panel: Recent Labs  Lab 04/30/24 0522 05/02/24 0515 05/05/24 1003  NA 136 138 134*  K 3.8 3.7 3.9  CL 101 102 101  CO2 28 26 25   GLUCOSE 174* 156* 234*  BUN 8 9 10   CREATININE 0.64 0.60 0.66  CALCIUM  9.6 9.6 9.1  MG  --  1.8  --   PHOS  --  3.3  --    Liver Function Tests: Recent Labs  Lab 04/30/24 0522  AST <10*  ALT 6  ALKPHOS 78  BILITOT 0.4  PROT 7.0  ALBUMIN  3.1*   CBG: Recent Labs  Lab 05/05/24 1105 05/05/24 1631 05/05/24 2139 05/06/24 0741 05/06/24 1146  GLUCAP 221* 173* 191* 186* 200*    Discharge time spent: greater than 30 minutes.  Signed: Owen DELENA Lore, MD [facility] 05/06/2024

## 2024-05-06 NOTE — Progress Notes (Signed)
 It sounds like Ms. Willis might be going home today.  This will be a good thing.  I am happy for her.  She says will be leaking from the ostomy.  Hopefully this will not be a problem.  She has had bowel movements.  She is eating okay.  There is no nausea or vomiting.  There is still some issues with pain.  She is on anticoagulation.  She is on Eliquis .  She had labs done yesterday.  Blood sugar is on the high side at 234.  Her white count 7.8.  Hemoglobin 12.3.  Platelet count 172,000.  She has had no issues with fever.  She is doing some walking.  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  As such, I suspect that she probably will be on anticoagulation for quite a while.   Her vital signs are stable.  Temperature 98.  Pulse 72.  Blood pressure 129/85.  Her lungs are clear bilaterally.  She has good air movement bilaterally.  Cardiac exam regular rate and rhythm.  She has no murmurs.  Abdomen is soft.  She is somewhat obese.  She has a ostomy that is intact.  Bowel sounds are present.  Extremities shows no clubbing, cyanosis or edema.   Hopefully, she will go home today.  I know this is been a rough stretch for her.  She had surgery for the perforated diverticulum.  Again she is on Eliquis .  I will keep her on full dose Eliquis  for at least a year.  I probably would like to see her back in the office I would think about 3 to 4 weeks.  I think that her main follow-up will need to be with surgery.   Jeralyn Crease, MD  1 Thessalonians 5:18

## 2024-05-13 ENCOUNTER — Other Ambulatory Visit: Payer: Self-pay

## 2024-05-14 ENCOUNTER — Other Ambulatory Visit: Payer: Self-pay | Admitting: *Deleted

## 2024-05-14 ENCOUNTER — Inpatient Hospital Stay: Admitting: Hematology & Oncology

## 2024-05-14 ENCOUNTER — Other Ambulatory Visit: Payer: Self-pay

## 2024-05-14 ENCOUNTER — Encounter: Payer: Self-pay | Admitting: Hematology & Oncology

## 2024-05-14 ENCOUNTER — Inpatient Hospital Stay: Payer: Self-pay | Attending: Hematology & Oncology

## 2024-05-14 VITALS — BP 164/89 | HR 76 | Resp 18 | Ht 69.0 in | Wt 275.0 lb

## 2024-05-14 DIAGNOSIS — I81 Portal vein thrombosis: Secondary | ICD-10-CM | POA: Diagnosis not present

## 2024-05-14 DIAGNOSIS — K5792 Diverticulitis of intestine, part unspecified, without perforation or abscess without bleeding: Secondary | ICD-10-CM

## 2024-05-14 DIAGNOSIS — D6862 Lupus anticoagulant syndrome: Secondary | ICD-10-CM | POA: Insufficient documentation

## 2024-05-14 DIAGNOSIS — Z7901 Long term (current) use of anticoagulants: Secondary | ICD-10-CM | POA: Insufficient documentation

## 2024-05-14 LAB — CBC WITH DIFFERENTIAL (CANCER CENTER ONLY)
Abs Immature Granulocytes: 0.04 K/uL (ref 0.00–0.07)
Basophils Absolute: 0 K/uL (ref 0.0–0.1)
Basophils Relative: 1 %
Eosinophils Absolute: 0.9 K/uL — ABNORMAL HIGH (ref 0.0–0.5)
Eosinophils Relative: 16 %
HCT: 38.1 % (ref 36.0–46.0)
Hemoglobin: 12.3 g/dL (ref 12.0–15.0)
Immature Granulocytes: 1 %
Lymphocytes Relative: 22 %
Lymphs Abs: 1.3 K/uL (ref 0.7–4.0)
MCH: 31.3 pg (ref 26.0–34.0)
MCHC: 32.3 g/dL (ref 30.0–36.0)
MCV: 96.9 fL (ref 80.0–100.0)
Monocytes Absolute: 0.7 K/uL (ref 0.1–1.0)
Monocytes Relative: 11 %
Neutro Abs: 2.9 K/uL (ref 1.7–7.7)
Neutrophils Relative %: 49 %
Platelet Count: 228 K/uL (ref 150–400)
RBC: 3.93 MIL/uL (ref 3.87–5.11)
RDW: 13.5 % (ref 11.5–15.5)
WBC Count: 5.9 K/uL (ref 4.0–10.5)
nRBC: 0 % (ref 0.0–0.2)

## 2024-05-14 LAB — COMPREHENSIVE METABOLIC PANEL WITH GFR
ALT: 5 U/L (ref 0–44)
AST: 11 U/L — ABNORMAL LOW (ref 15–41)
Albumin: 3.1 g/dL — ABNORMAL LOW (ref 3.5–5.0)
Alkaline Phosphatase: 67 U/L (ref 38–126)
Anion gap: 9 (ref 5–15)
BUN: <5 mg/dL — ABNORMAL LOW (ref 8–23)
CO2: 30 mmol/L (ref 22–32)
Calcium: 9.1 mg/dL (ref 8.9–10.3)
Chloride: 106 mmol/L (ref 98–111)
Creatinine, Ser: 0.64 mg/dL (ref 0.44–1.00)
GFR, Estimated: >60 mL/min
Glucose, Bld: 155 mg/dL — ABNORMAL HIGH (ref 70–99)
Potassium: 3.1 mmol/L — ABNORMAL LOW (ref 3.5–5.1)
Sodium: 145 mmol/L (ref 135–145)
Total Bilirubin: 0.3 mg/dL (ref 0.0–1.2)
Total Protein: 6.9 g/dL (ref 6.5–8.1)

## 2024-05-14 MED ORDER — FUROSEMIDE 20 MG PO TABS: 20.0000 mg | ORAL_TABLET | Freq: Every day | ORAL | 6 refills | Status: AC | PRN

## 2024-05-14 MED ORDER — GABAPENTIN 100 MG PO CAPS: 200.0000 mg | ORAL_CAPSULE | Freq: Three times a day (TID) | ORAL | 3 refills | Status: AC

## 2024-05-14 NOTE — Progress Notes (Signed)
 " Hematology and Oncology Follow Up Visit  Lisa Ibarra [id number] January 13, 1961 63 y.o. 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 twice daily -start on 04/13/2024     Interim History:  Lisa Ibarra is in for her first office visit.  I had seen her in the hospital in consultation.  She had a portal vein thrombus.  She subsequently required surgery because of her perforated diverticulum.  She has a colostomy right now.  She has been on heparin  while in the hospital.  We now have her on Eliquis .  She is doing okay.  She has had no bleeding.  She is having a lot of problems with the ostomy site.  We will have to see if we cannot get an ostomy nurse to help her out.  I am not sure when she goes back to see surgery.  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  We also found that she had a lupus anticoagulant.  Again, the lupus anticoagulant could certainly be an acute issue.  She has had no problems with cough or shortness of breath.  She has had no fever.  She has had no leg swelling.  She has had no rashes.  Overall, I would say that her performance status is probably ECOG 1.  Medications: Current Medications[1]  Allergies: Allergies[2]  Past Medical History, Surgical history, Social history, and Family History were reviewed and updated.  Review of Systems: Review of Systems  Constitutional: Negative.   HENT:  Negative.    Eyes: Negative.   Respiratory: Negative.    Cardiovascular: Negative.   Gastrointestinal:  Positive for abdominal pain and diarrhea.  Endocrine: Negative.   Genitourinary: Negative.    Musculoskeletal: Negative.   Skin:  Positive for wound.  Neurological: Negative.   Hematological: Negative.   Psychiatric/Behavioral: Negative.      Physical Exam:  vitals were not taken for this visit.   Wt Readings from Last 3 Encounters:   04/24/24 270 lb (122.5 kg)  03/25/24 286 lb 13.1 oz (130.1 kg)  03/22/24 286 lb 13.1 oz (130.1 kg)    Physical Exam Vitals reviewed.  HENT:     Head: Normocephalic and atraumatic.  Eyes:     Pupils: Pupils are equal, round, and reactive to light.  Cardiovascular:     Rate and Rhythm: Normal rate and regular rhythm.     Heart sounds: Normal heart sounds.     Comments: Cardiac exam shows regular rate and rhythm with no murmurs, rubs or bruits. Pulmonary:     Effort: Pulmonary effort is normal.     Breath sounds: Normal breath sounds.     Comments: Her lungs do sound a little congested.  She has a little bit of wheezing.  I think she does have nebulizers at home. Abdominal:     General: Bowel sounds are normal.     Palpations: Abdomen is soft.     Comments: Abdominal exam shows a colostomy.  She has stool in the colostomy bag.  She has the surgical scar that is healing.  She has had no fluid wave.  She has had no palpable liver or spleen tip.  Musculoskeletal:        General: No tenderness or deformity. Normal range of motion.     Cervical back: Normal range of motion.  Lymphadenopathy:     Cervical: No cervical adenopathy.  Skin:    General: Skin is warm and dry.     Findings: No erythema or rash.  Neurological:     Mental Status: She is alert and oriented to person, place, and time.  Psychiatric:        Behavior: Behavior normal.        Thought Content: Thought content normal.        Judgment: Judgment normal.      Lab Results  Component Value Date   WBC 5.9 05/14/2024   HGB 12.3 05/14/2024   HCT 38.1 05/14/2024   MCV 96.9 05/14/2024   PLT 228 05/14/2024     Chemistry      Component Value Date/Time   NA 134 (L) 05/05/2024 1003   K 3.9 05/05/2024 1003   CL 101 05/05/2024 1003   CO2 25 05/05/2024 1003   BUN 10 05/05/2024 1003   CREATININE 0.66 05/05/2024 1003      Component Value Date/Time   CALCIUM  9.1 05/05/2024 1003   ALKPHOS 78 04/30/2024 0522   AST  <10 (L) 04/30/2024 0522   ALT 6 04/30/2024 0522   BILITOT 0.4 04/30/2024 0522      Impression and Plan: Lisa Ibarra is a very nice 62 year old Afro-American female.  She comes in with her daughter.  She had a portal vein thrombus.  She does have the Prothrombin II gene mutation.  I do suspect that the lupus anticoagulant may be transient.  At some point, we really have to repeat a angiogram of the abdomen to see how the portal vein thrombus is resolving.  I will see about getting this in late January.  She will need to be on anticoagulation for quite a while.  I would think that she probably needs to be on anticoagulation for about a year and then be on maintenance for a year or so after that.  She will need to have the colostomy reversed.  It sounds like this probably will be in the Summer.  I would like to see her back after she has a CT angiogram.  As always it is a lot of fun talking with her.  She is incredibly eloquent.   Lisa JONELLE Crease, MD 12/23/20258:43 AM     [1]  Current Outpatient Medications:    acetaminophen  (TYLENOL ) 500 MG tablet, Take 2 tablets (1,000 mg total) by mouth every 6 (six) hours., Disp: 30 tablet, Rfl: 0   albuterol  (VENTOLIN  HFA) 108 (90 Base) MCG/ACT inhaler, Inhale 2 puffs into the lungs every 6 (six) hours as needed for wheezing or shortness of breath., Disp: 1 each, Rfl: 3   amLODipine  (NORVASC ) 5 MG tablet, Take 5 mg by mouth daily., Disp: , Rfl:    apixaban  (ELIQUIS ) 5 MG TABS tablet, Take 1 tablet (5 mg total) by mouth 2 (two) times daily., Disp: 60 tablet, Rfl: 3   arformoterol  (BROVANA ) 15 MCG/2ML NEBU, Take 2 mLs (15 mcg total) by nebulization 2 (two) times daily. (Patient not taking: Reported on 03/27/2024), Disp: 120 mL, Rfl: 0   Aspirin-Acetaminophen -Caffeine  (GOODYS EXTRA STRENGTH) 500-325-65 MG PACK, Take 1 packet by mouth See admin instructions. Dissolve 1 packet in the mouth up to six times a day as needed for pain, Disp: , Rfl:     atorvastatin  (LIPITOR) 20 MG tablet, Take 20 mg by mouth daily. (Patient not taking: Reported on 04/25/2024), Disp: , Rfl:    budesonide  (PULMICORT ) 0.5 MG/2ML nebulizer solution, Take 2 mLs (0.5 mg total) by nebulization 2 (two) times daily. (  Patient not taking: Reported on 03/27/2024), Disp: 120 mL, Rfl: 0   furosemide  (LASIX ) 20 MG tablet, Take 20-40 mg by mouth daily as needed for fluid or edema., Disp: , Rfl:    gabapentin  (NEURONTIN ) 100 MG capsule, Take 2 capsules (200 mg total) by mouth 3 (three) times daily., Disp: 30 capsule, Rfl: 0   hydrALAZINE  (APRESOLINE ) 25 MG tablet, Take 1 tablet (25 mg total) by mouth every 8 (eight) hours., Disp: 90 tablet, Rfl: 0   insulin  glargine (LANTUS ) 100 unit/mL SOPN, Inject 15 Units into the skin daily., Disp: 15 mL, Rfl: 11   Insulin  Pen Needle 29G X MISC, For insulin  injection, Disp: 100 each, Rfl: 1   Insulin  Pen Needle 32G X 6 MM MISC, Use to inject insulin  3 times daily as directed, Disp: 100 each, Rfl: 0   losartan  (COZAAR ) 100 MG tablet, Take 1 tablet (100 mg total) by mouth daily., Disp: 30 tablet, Rfl: 0   methocarbamol  (ROBAXIN ) 500 MG tablet, Take 2 tablets (1,000 mg total) by mouth 4 (four) times daily for 15 days., Disp: 120 tablet, Rfl: 0   metoprolol  succinate (TOPROL -XL) 100 MG 24 hr tablet, Take 1 tablet (100 mg total) by mouth daily. Take with or immediately following a meal., Disp: 30 tablet, Rfl: 0   OZEMPIC, 0.25 OR 0.5 MG/DOSE, 2 MG/3ML SOPN, Inject 0.25 mg into the skin every Sunday., Disp: , Rfl:    pantoprazole  (PROTONIX ) 40 MG tablet, Take 1 tablet (40 mg total) by mouth daily., Disp: 30 tablet, Rfl: 0   polyethylene glycol powder (GLYCOLAX /MIRALAX ) 17 GM/SCOOP powder, Take 17 g by mouth 2 (two) times daily. Dissolve 1 capful (17g) in 4-8 ounces of liquid and take by mouth daily., Disp: 238 g, Rfl: 0   revefenacin  (YUPELRI ) 175 MCG/3ML nebulizer solution, Take 3 mLs (175 mcg total) by nebulization daily. (Patient not taking:  Reported on 03/27/2024), Disp: 90 mL, Rfl: 0 [2]  Allergies Allergen Reactions   Bee Venom Anaphylaxis and Hives   Empagliflozin Itching, Rash and Dermatitis   Ibuprofen Nausea And Vomiting   Statins Nausea Only and Other (See Comments)    Muscle spasms and cramps, too   "

## 2024-05-22 ENCOUNTER — Telehealth: Payer: Self-pay

## 2024-05-22 NOTE — Telephone Encounter (Signed)
 Called [facility] to follow up on patient's referral . Spoke with [facility]. She confirmed that they received the referral and they will call the patient in the next few days.

## 2024-06-10 ENCOUNTER — Other Ambulatory Visit (HOSPITAL_COMMUNITY): Payer: Self-pay

## 2024-06-11 ENCOUNTER — Other Ambulatory Visit: Payer: Self-pay

## 2024-06-11 ENCOUNTER — Other Ambulatory Visit (HOSPITAL_COMMUNITY): Payer: Self-pay

## 2024-06-18 ENCOUNTER — Ambulatory Visit (HOSPITAL_BASED_OUTPATIENT_CLINIC_OR_DEPARTMENT_OTHER): Payer: Self-pay

## 2024-06-25 ENCOUNTER — Inpatient Hospital Stay: Payer: Self-pay | Admitting: Hematology & Oncology

## 2024-06-25 ENCOUNTER — Inpatient Hospital Stay: Payer: Self-pay | Attending: Hematology & Oncology

## 2024-06-27 ENCOUNTER — Ambulatory Visit (HOSPITAL_BASED_OUTPATIENT_CLINIC_OR_DEPARTMENT_OTHER): Payer: Self-pay

## 2024-07-01 ENCOUNTER — Ambulatory Visit (HOSPITAL_BASED_OUTPATIENT_CLINIC_OR_DEPARTMENT_OTHER)

## 2024-07-08 ENCOUNTER — Inpatient Hospital Stay: Admitting: Hematology & Oncology

## 2024-07-08 ENCOUNTER — Inpatient Hospital Stay
